# Patient Record
Sex: Female | Born: 1945 | Race: White | Hispanic: No | Marital: Single | Smoking: Current every day smoker
Health system: Southern US, Community
[De-identification: ages and names within clinical notes are randomized; demographics above are authoritative.]

## PROBLEM LIST (undated history)

## (undated) DIAGNOSIS — J449 Chronic obstructive pulmonary disease, unspecified: Secondary | ICD-10-CM

## (undated) DIAGNOSIS — J40 Bronchitis, not specified as acute or chronic: Secondary | ICD-10-CM

## (undated) DIAGNOSIS — F17210 Nicotine dependence, cigarettes, uncomplicated: Secondary | ICD-10-CM

## (undated) DIAGNOSIS — J189 Pneumonia, unspecified organism: Secondary | ICD-10-CM

## (undated) DIAGNOSIS — R0602 Shortness of breath: Secondary | ICD-10-CM

## (undated) HISTORY — PX: ABDOMINAL HYSTERECTOMY: SHX81

## (undated) HISTORY — PX: HIP FRACTURE SURGERY: SHX118

## (undated) HISTORY — PX: BREAST SURGERY: SHX581

---

## 2013-05-02 ENCOUNTER — Encounter (INDEPENDENT_AMBULATORY_CARE_PROVIDER_SITE_OTHER): Payer: Self-pay

## 2013-05-14 ENCOUNTER — Encounter (INDEPENDENT_AMBULATORY_CARE_PROVIDER_SITE_OTHER): Payer: Self-pay | Admitting: Surgery

## 2013-05-14 ENCOUNTER — Ambulatory Visit (INDEPENDENT_AMBULATORY_CARE_PROVIDER_SITE_OTHER): Payer: Medicare Other | Admitting: Surgery

## 2013-05-14 VITALS — BP 170/68 | HR 84 | Resp 18 | Ht 65.0 in | Wt 208.0 lb

## 2013-05-14 DIAGNOSIS — N6320 Unspecified lump in the left breast, unspecified quadrant: Secondary | ICD-10-CM | POA: Insufficient documentation

## 2013-05-14 DIAGNOSIS — N63 Unspecified lump in unspecified breast: Secondary | ICD-10-CM

## 2013-05-14 HISTORY — DX: Unspecified lump in the left breast, unspecified quadrant: N63.20

## 2013-05-14 NOTE — Progress Notes (Signed)
Patient ID: Deanna Petty, female   DOB: 03/21/1946, 67 y.o.   MRN: [id number]  Chief Complaint  Patient presents with  . New Evaluation    eval atypical lesion lt br    HPI Deanna Petty is a 67 y.o. female.  Referred by Dr. Tilda Burrow for left breast mass  HPI This is a 67 year old female who presents after a recent screening mammogram. This was her first mammogram in about 20 years. She was noted to have an area of asymmetry in her left breast. This is in the retroareolar region. She underwent ultrasound and spot compression views which showed a hypoechoic lesion in the left breast at 3:00 3 cm from the nipple. She underwent ultrasound-guided biopsy on 05/01/13 with placement of a clip. This showed a complex sclerosing lesion. There is no sign of atypia or malignancy. She now presents for surgical evaluation for excision. History reviewed. No pertinent past medical history.  Past Surgical History  Procedure Laterality Date  . Abdominal hysterectomy      Family History  Problem Relation Age of Onset  . Cancer Mother     oral, brain  . Diabetes Father     Social History History  Substance Use Topics  . Smoking status: Current Every Day Smoker -- 0.25 packs/day  . Smokeless tobacco: Not on file  . Alcohol Use: No    No Known Allergies  No current outpatient prescriptions on file.   No current facility-administered medications for this visit.    Review of Systems Review of Systems  Constitutional: Negative for fever, chills and unexpected weight change.  HENT: Negative for hearing loss, congestion, sore throat, trouble swallowing and voice change.   Eyes: Negative for visual disturbance.  Respiratory: Negative for cough and wheezing.   Cardiovascular: Negative for chest pain, palpitations and leg swelling.  Gastrointestinal: Negative for nausea, vomiting, abdominal pain, diarrhea, constipation, blood in stool, abdominal distention and anal bleeding.  Genitourinary:  Negative for hematuria, vaginal bleeding and difficulty urinating.  Musculoskeletal: Negative for arthralgias.  Skin: Negative for rash and wound.  Neurological: Negative for seizures, syncope and headaches.  Hematological: Negative for adenopathy. Does not bruise/bleed easily.  Psychiatric/Behavioral: Negative for confusion.    Blood pressure 170/68, pulse 84, resp. rate 18, height 5\' 5"  (1.651 m), weight 208 lb (94.348 kg).  Physical Exam Physical Exam WDWN in NAD HEENT:  EOMI, sclera anicteric Neck:  No masses, no thyromegaly Lungs:  CTA bilaterally; normal respiratory effort Breasts:  Bilateral fibrocystic changes; no nipple retraction or discharge; no axillary lymphadenopathy; no dominant masses on either side CV:  Regular rate and rhythm; no murmurs Abd:  +bowel sounds, soft, non-tender, no masses Ext:  Well-perfused; no edema Skin:  Warm, dry; no sign of jaundice  Data Reviewed Mammogram/ U/S/ core biopsy as described above  Assessment    Left breast mass - non-palpable - 3:00 3 cm from nipple     Plan    Left needle-localized lumpectomy.  The surgical procedure has been discussed with the patient.  Potential risks, benefits, alternative treatments, and expected outcomes have been explained.  All of the patient's questions at this time have been answered.  The likelihood of reaching the patient's treatment goal is good.  The patient understand the proposed surgical procedure and wishes to proceed.         Anna Beaird K. 05/14/2013, 12:00 PM

## 2013-05-16 ENCOUNTER — Encounter (INDEPENDENT_AMBULATORY_CARE_PROVIDER_SITE_OTHER): Payer: Self-pay

## 2013-05-29 ENCOUNTER — Encounter (HOSPITAL_COMMUNITY): Payer: Self-pay | Admitting: Pharmacy Technician

## 2013-05-31 NOTE — Pre-Procedure Instructions (Signed)
Deanna Petty  05/31/2013   Your procedure is scheduled on:  Tuesday June 11, 2013.  Report to [facility] immediately after leaving [facility].  Call this number if you have problems the morning of surgery: [phone]   Remember:   Do not eat food or drink liquids after midnight.   Take these medicines the morning of surgery with A SIP OF WATER: NONE  Take Albuterol inhaler if needed for wheezing or shortness of breath  Discontinue aspirin and herbal medications 5 days prior to surgery   Do not wear jewelry, make-up or nail polish.  Do not wear lotions, powders, or perfumes. You may NOT wear deodorant.  Do not shave 48 hours prior to surgery. .  Do not bring valuables to the hospital.  [facility] is not responsible for any belongings or valuables.  Contacts, dentures or bridgework may not be worn into surgery.  Leave suitcase in the car. After surgery it may be brought to your room.  For patients admitted to the hospital, checkout time is 11:00 AM the day of discharge.   Patients discharged the day of surgery will not be allowed to drive home.  Name and phone number of your driver: Family/Friend  Special Instructions: Shower using CHG 2 nights before surgery and the night before surgery.  If you shower the day of surgery use CHG.  Use special wash - you have one bottle of CHG for all showers.  You should use approximately 1/3 of the bottle for each shower.   Please read over the following fact sheets that you were given: Pain Booklet, Coughing and Deep Breathing and Surgical Site Infection Prevention

## 2013-06-03 ENCOUNTER — Encounter (HOSPITAL_COMMUNITY)
Admission: RE | Admit: 2013-06-03 | Discharge: 2013-06-03 | Disposition: A | Discharge status: Home / Self Care | Payer: Medicare Other | Source: Ambulatory Visit | Attending: Surgery | Admitting: Surgery

## 2013-06-03 ENCOUNTER — Encounter (HOSPITAL_COMMUNITY): Payer: Self-pay

## 2013-06-03 ENCOUNTER — Ambulatory Visit (HOSPITAL_COMMUNITY)
Admission: RE | Admit: 2013-06-03 | Discharge: 2013-06-03 | Disposition: A | Discharge status: Home / Self Care | Payer: Medicare Other | Source: Ambulatory Visit | Attending: Surgery | Admitting: Surgery

## 2013-06-03 DIAGNOSIS — R0602 Shortness of breath: Secondary | ICD-10-CM | POA: Insufficient documentation

## 2013-06-03 DIAGNOSIS — R030 Elevated blood-pressure reading, without diagnosis of hypertension: Secondary | ICD-10-CM | POA: Insufficient documentation

## 2013-06-03 DIAGNOSIS — N63 Unspecified lump in unspecified breast: Secondary | ICD-10-CM | POA: Insufficient documentation

## 2013-06-03 DIAGNOSIS — Z01812 Encounter for preprocedural laboratory examination: Secondary | ICD-10-CM | POA: Insufficient documentation

## 2013-06-03 DIAGNOSIS — Z01818 Encounter for other preprocedural examination: Secondary | ICD-10-CM | POA: Insufficient documentation

## 2013-06-03 DIAGNOSIS — R9431 Abnormal electrocardiogram [ECG] [EKG]: Secondary | ICD-10-CM | POA: Insufficient documentation

## 2013-06-03 DIAGNOSIS — Z01810 Encounter for preprocedural cardiovascular examination: Secondary | ICD-10-CM | POA: Insufficient documentation

## 2013-06-03 DIAGNOSIS — Z8701 Personal history of pneumonia (recurrent): Secondary | ICD-10-CM | POA: Insufficient documentation

## 2013-06-03 HISTORY — DX: Bronchitis, not specified as acute or chronic: J40

## 2013-06-03 HISTORY — DX: Pneumonia, unspecified organism: J18.9

## 2013-06-03 HISTORY — DX: Shortness of breath: R06.02

## 2013-06-03 HISTORY — DX: Nicotine dependence, cigarettes, uncomplicated: F17.210

## 2013-06-03 LAB — BASIC METABOLIC PANEL
BUN: 12 mg/dL (ref 6–23)
CO2: 26 mEq/L (ref 19–32)
Calcium: 9.7 mg/dL (ref 8.4–10.5)
Creatinine, Ser: 0.82 mg/dL (ref 0.50–1.10)
GFR calc non Af Amer: 73 mL/min — ABNORMAL LOW (ref >90)
Glucose, Bld: 165 mg/dL — ABNORMAL HIGH (ref 70–99)
Sodium: 138 mEq/L (ref 135–145)

## 2013-06-03 LAB — CBC
MCH: 32.4 pg (ref 26.0–34.0)
MCHC: 34.9 g/dL (ref 30.0–36.0)
MCV: 92.7 fL (ref 78.0–100.0)
Platelets: 167 10*3/uL (ref 150–400)
RDW: 13.1 % (ref 11.5–15.5)

## 2013-06-03 NOTE — Progress Notes (Signed)
Patient denied having a stress test, cardiac cath, or sleep study. PCP is Hyacinth Meeker, FNP at [facility].

## 2013-06-04 ENCOUNTER — Other Ambulatory Visit (INDEPENDENT_AMBULATORY_CARE_PROVIDER_SITE_OTHER): Payer: Self-pay | Admitting: Surgery

## 2013-06-04 ENCOUNTER — Telehealth (INDEPENDENT_AMBULATORY_CARE_PROVIDER_SITE_OTHER): Payer: Self-pay

## 2013-06-04 ENCOUNTER — Encounter (INDEPENDENT_AMBULATORY_CARE_PROVIDER_SITE_OTHER): Payer: Self-pay | Admitting: General Surgery

## 2013-06-04 ENCOUNTER — Telehealth (INDEPENDENT_AMBULATORY_CARE_PROVIDER_SITE_OTHER): Payer: Self-pay | Admitting: General Surgery

## 2013-06-04 DIAGNOSIS — R9431 Abnormal electrocardiogram [ECG] [EKG]: Secondary | ICD-10-CM

## 2013-06-04 NOTE — Telephone Encounter (Signed)
Please get her in with a cardiologist ASAP

## 2013-06-04 NOTE — Telephone Encounter (Signed)
Patient is seeing Dr Excell Seltzer on 06-07-13 @ 9:15. I told the patient to be there at 9:00. I told her that I will need to get the okay from Dr Excell Seltzer for her to have her surgery on Tuesday the 06-11-13, has of now it is still on for Tuesday, but I have to get the okay from Dr Excell Seltzer, I told her that we will be talking after I here from Dr Excell Seltzer

## 2013-06-04 NOTE — Telephone Encounter (Signed)
The pt is scheduled for a lumpectomy on 8/26 by Dr Corliss Skains but short stay is requesting cardiac clearance. The pt was having T wave inversion on the EKG and they called the PCP to see if any EKG to compare but they don't have anything to compare to. The pt has never been seen by a cardiologist. Please advise.

## 2013-06-04 NOTE — Progress Notes (Addendum)
Anesthesia Chart Review:  Patient is a 67 year old female scheduled for left needle localized lumpectomy on 06/11/13 by Dr. Corliss Skains.  History includes left breast mass with biopsy showing a complex sclerosing lesion but no evidence of malignancy, hysterectomy, smoking, PNA, bronchitis, obesity. Based on her recent BP readings (170/68 on 05/14/13 and 191/84 on 06/03/13), she may have untreated HTN. PCP is listed as Hyacinth Meeker, FNP at [facility] [phone] only once.  EKG on 8/189/14 showed NSR, rightward axis, diffuse ST/T wave abnormality--consider inferior and anterolateral ischemia.  There are no comparison EKGs in Epic or at her PCP office.  Preoperative CXR and labs noted.  With significantly elevated BP and abnormal EKG, recommend cardiology evaluation preoperatively.  Anesthesiologist Dr. Michelle Piper agrees with plan.  Alisha at [facility] notified of elevated BP reading and need for cardiology evaluation for the above reasons.  Velna Ochs [facility]/Anesthesiology Phone [phone] 06/04/2013 1:57 PM  Addendum: 06/10/13 5:00 PM Patient was evaluated by Dr. Excell Seltzer on 06/07/13.  BP then was 144/70.  He recommended smoking cessation and a dobutamime stress echo prior to surgery.  This was done today--report is still pending.  I called [facility] ~ 1400 to notify the reading physician that results would be needed before patient's surgery scheduled for tomorrow.  I also sent a staff message to [facility] Glade, MA ~ 1500 notifying her that the clearance note faxed was pending a low risk stress test result which was not yet available.  I sent Dr. Corliss Skains a staff message as well to alert him that I would have our late nurses follow-up otherwise results will have to be checked first thing in the morning.   As long as her stress test was low risk, Dr. Excell Seltzer feels she could proceed with surgery.

## 2013-06-07 ENCOUNTER — Encounter: Payer: Self-pay | Admitting: Cardiovascular Disease

## 2013-06-07 ENCOUNTER — Ambulatory Visit (INDEPENDENT_AMBULATORY_CARE_PROVIDER_SITE_OTHER): Payer: Medicare Other | Admitting: Cardiovascular Disease

## 2013-06-07 VITALS — BP 144/70 | HR 77 | Ht 65.0 in | Wt 205.0 lb

## 2013-06-07 DIAGNOSIS — R0602 Shortness of breath: Secondary | ICD-10-CM

## 2013-06-07 DIAGNOSIS — R9431 Abnormal electrocardiogram [ECG] [EKG]: Secondary | ICD-10-CM

## 2013-06-07 NOTE — Progress Notes (Signed)
HPI:   67 year-old woman presenting for preoperative cardiac evaluation before upcoming lumpectomy. She had her first screening mammogram in over 20 years and this demonstrated an area of asymmetry in the left breast. She's undergone a biopsy and now is scheduled for lumpectomy.Her preoperative EKG was abnormal and she is referred for preoperative cardiac evaluation.  She has no past history of cardiac disease. She's had very little medical care. The patient has been a long-standing smoker. She has shortness of breath with 1 flight of stairs or with one block of walkingon level ground.she denies chest pain or pressure with activity. She denies orthopnea, PND, or palpitations. She's had no lightheadedness or syncope.she has occasional cough and wheezing. She uses inhalers as needed.  Outpatient Encounter Prescriptions as of 06/07/2013  Medication Sig Dispense Refill  . albuterol (PROVENTIL HFA;VENTOLIN HFA) 108 (90 BASE) MCG/ACT inhaler Inhale 2 puffs into the lungs every 6 (six) hours as needed for wheezing or shortness of breath.      Marland Kitchen ibuprofen (ADVIL,MOTRIN) 200 MG tablet Take 400 mg by mouth every 6 (six) hours as needed for pain.       No facility-administered encounter medications on file as of 06/07/2013.    Review of patient's allergies indicates no known allergies.  Past Medical History  Diagnosis Date  . Cigarette smoker   . Bronchitis   . Pneumonia   . Shortness of breath     with exertion; lasted used inhaler 06/02/13    Past Surgical History  Procedure Laterality Date  . Abdominal hysterectomy      History   Social History  . Marital Status: Single    Spouse Name: N/A    Number of Children: N/A  . Years of Education: N/A   Occupational History  . Not on file.   Social History Main Topics  . Smoking status: Current Every Day Smoker -- 0.25 packs/day for 40 years    Types: Cigarettes  . Smokeless tobacco: Not on file  . Alcohol Use: Yes     Comment: rare  .  Drug Use: No  . Sexual Activity: Not on file   Other Topics Concern  . Not on file   Social History Narrative  . No narrative on file    Family History  Problem Relation Age of Onset  . Cancer Mother     oral, brain  . Diabetes Father    Family history: Father had a myocardial infarction at age 62  ROS:  General: no fevers/chills/night sweats Eyes: no blurry vision, diplopia, or amaurosis ENT: no sore throat or hearing loss Resp: positive for cough, wheezing, negative for hemoptysis CV: no edema or palpitations GI: no abdominal pain, nausea, vomiting, diarrhea, or constipation GU: no dysuria, frequency, or hematuria Skin: no rash Neuro: no headache, numbness, tingling, or weakness of extremities Musculoskeletal: no joint pain or swelling Heme: no bleeding, DVT, or easy bruising Endo: no polydipsia or polyuria  BP 144/70  Pulse 77  Ht 5\' 5"  (1.651 m)  Wt 205 lb (92.987 kg)  BMI 34.11 kg/m2  SpO2 97%  PHYSICAL EXAM: Pt is alert and oriented, WD, WN, in no distress. HEENT: normal Neck: JVP normal. Carotid upstrokes normal without bruits. No thyromegaly. Lungs: equal expansion, coarse breath sounds bilaterally, prolonged expiratory phase CV: Apex is discrete and nondisplaced, RRR without murmur or gallop Abd: soft, NT, +BS, no bruit, no hepatosplenomegaly Back: no CVA tenderness Ext: no C/C/E        Femoral pulses 2+=  without bruits        DP/PT pulses intact and = Skin: warm and dry without rash Neuro: CNII-XII intact             Strength intact = bilaterally  EKG:  Sinus rhythm, right axis deviation, T-wave abnormality consider anterolateral ischemia.  ASSESSMENT AND PLAN: 67 year old woman with cardiac risk factors of heavy tobacco use and family history of CAD.Her EKG has significant abnormality and raises concern for anterior ischemia. Shortness of breath is probably multifactorial, especially considering her smoking history. I think her EKG abnormality is  significant enough to warrant preoperative stress testing.she will be scheduled for a dobutamine stress echocardiogram on Monday. Her surgery is scheduled for Tuesday. As long as her stress test is low risk, she can proceed with surgery without further testing.  She was counseled about tobacco cessation today. We also recommended that she establish with a primary care physician. By history she's had lipids checked and they were okay, but we do not have details of this. I will see her back as needed unless her stress test shows significant abnormality.  Tonny Bollman 06/07/2013 6:29 PM

## 2013-06-07 NOTE — Patient Instructions (Addendum)
Your physician has requested that you have a dobutamine echocardiogram. For further information please visit [email or web address] Please follow instruction sheet as given. PLEASE ARRIVE AT 7:30 ON 06/10/13 FOR AN 8:30 APPOINTMENT, BRING ALBUTEROL INHALER  Your physician recommends that you continue on your current medications as directed. Please refer to the Current Medication list given to you today.

## 2013-06-10 ENCOUNTER — Ambulatory Visit (HOSPITAL_BASED_OUTPATIENT_CLINIC_OR_DEPARTMENT_OTHER): Payer: Medicare Other

## 2013-06-10 ENCOUNTER — Ambulatory Visit (HOSPITAL_COMMUNITY): Payer: Medicare Other | Attending: Cardiovascular Disease | Admitting: Radiology

## 2013-06-10 ENCOUNTER — Telehealth (INDEPENDENT_AMBULATORY_CARE_PROVIDER_SITE_OTHER): Payer: Self-pay | Admitting: General Surgery

## 2013-06-10 DIAGNOSIS — Z87891 Personal history of nicotine dependence: Secondary | ICD-10-CM | POA: Insufficient documentation

## 2013-06-10 DIAGNOSIS — R9431 Abnormal electrocardiogram [ECG] [EKG]: Secondary | ICD-10-CM

## 2013-06-10 DIAGNOSIS — Z8249 Family history of ischemic heart disease and other diseases of the circulatory system: Secondary | ICD-10-CM | POA: Insufficient documentation

## 2013-06-10 DIAGNOSIS — R0989 Other specified symptoms and signs involving the circulatory and respiratory systems: Secondary | ICD-10-CM

## 2013-06-10 DIAGNOSIS — R0602 Shortness of breath: Secondary | ICD-10-CM

## 2013-06-10 DIAGNOSIS — R0609 Other forms of dyspnea: Secondary | ICD-10-CM | POA: Insufficient documentation

## 2013-06-10 NOTE — Telephone Encounter (Signed)
Called patient to let her know that we have received cardiac clearance from Dr Excell Seltzer and she will have her surgery on Wednesday 06-12-2013 with Dr Corliss Skains.

## 2013-06-10 NOTE — Progress Notes (Signed)
Dobutamine Stress Echocardiogram Performed. 

## 2013-06-11 MED ORDER — CEFAZOLIN SODIUM-DEXTROSE 2-3 GM-% IV SOLR
2.0000 g | INTRAVENOUS | Status: AC
  Administered 2013-06-12: 2 g via INTRAVENOUS
  Filled 2013-06-11: qty 50

## 2013-06-11 NOTE — Progress Notes (Signed)
I spoke to patient and she is to go to [place] at 11:30 , 06/12/13 and come to [facility] after that visit.  Pt asked about drinking since surgery is later in the day.  I instructed her  That no eating or drinking after midnight still remains.

## 2013-06-12 ENCOUNTER — Ambulatory Visit (HOSPITAL_COMMUNITY)
Admission: RE | Admit: 2013-06-12 | Discharge: 2013-06-12 | Disposition: A | Discharge status: Home / Self Care | Payer: Medicare Other | Source: Ambulatory Visit | Attending: Surgery | Admitting: Surgery

## 2013-06-12 ENCOUNTER — Encounter (HOSPITAL_COMMUNITY)
Admission: RE | Disposition: A | Discharge status: Home / Self Care | Payer: Self-pay | Source: Ambulatory Visit | Attending: Surgery

## 2013-06-12 ENCOUNTER — Encounter (HOSPITAL_COMMUNITY): Payer: Self-pay | Admitting: Certified Registered Nurse Anesthetist

## 2013-06-12 ENCOUNTER — Encounter (HOSPITAL_COMMUNITY): Payer: Self-pay | Admitting: Vascular Surgery

## 2013-06-12 ENCOUNTER — Ambulatory Visit (HOSPITAL_COMMUNITY): Payer: Medicare Other | Admitting: Certified Registered Nurse Anesthetist

## 2013-06-12 ENCOUNTER — Telehealth (INDEPENDENT_AMBULATORY_CARE_PROVIDER_SITE_OTHER): Payer: Self-pay | Admitting: General Surgery

## 2013-06-12 DIAGNOSIS — D249 Benign neoplasm of unspecified breast: Secondary | ICD-10-CM | POA: Insufficient documentation

## 2013-06-12 DIAGNOSIS — I10 Essential (primary) hypertension: Secondary | ICD-10-CM | POA: Insufficient documentation

## 2013-06-12 DIAGNOSIS — N6320 Unspecified lump in the left breast, unspecified quadrant: Secondary | ICD-10-CM

## 2013-06-12 DIAGNOSIS — R920 Mammographic microcalcification found on diagnostic imaging of breast: Secondary | ICD-10-CM

## 2013-06-12 DIAGNOSIS — J4489 Other specified chronic obstructive pulmonary disease: Secondary | ICD-10-CM | POA: Insufficient documentation

## 2013-06-12 DIAGNOSIS — N6019 Diffuse cystic mastopathy of unspecified breast: Secondary | ICD-10-CM

## 2013-06-12 DIAGNOSIS — J449 Chronic obstructive pulmonary disease, unspecified: Secondary | ICD-10-CM | POA: Insufficient documentation

## 2013-06-12 HISTORY — PX: BREAST LUMPECTOMY WITH NEEDLE LOCALIZATION: SHX5759

## 2013-06-12 SURGERY — BREAST LUMPECTOMY WITH NEEDLE LOCALIZATION
Anesthesia: General | Site: Breast | Laterality: Left | Wound class: Clean

## 2013-06-12 MED ORDER — HYDROMORPHONE HCL PF 1 MG/ML IJ SOLN: 0.2500 mg | INTRAMUSCULAR | Status: DC | PRN

## 2013-06-12 MED ORDER — FENTANYL CITRATE 0.05 MG/ML IJ SOLN
INTRAMUSCULAR | Status: DC | PRN
Start: 2013-06-12 — End: 2013-06-12
  Administered 2013-06-12: 25 ug via INTRAVENOUS
  Administered 2013-06-12: 100 ug via INTRAVENOUS

## 2013-06-12 MED ORDER — BUPIVACAINE-EPINEPHRINE 0.25% -1:200000 IJ SOLN
INTRAMUSCULAR | Status: DC | PRN
  Administered 2013-06-12: 20 mL

## 2013-06-12 MED ORDER — BUPIVACAINE-EPINEPHRINE PF 0.25-1:200000 % IJ SOLN
INTRAMUSCULAR | Status: AC
  Filled 2013-06-12: qty 30

## 2013-06-12 MED ORDER — OXYCODONE HCL 5 MG PO TABS
ORAL_TABLET | ORAL | Status: AC
  Filled 2013-06-12: qty 1

## 2013-06-12 MED ORDER — MIDAZOLAM HCL 5 MG/5ML IJ SOLN
INTRAMUSCULAR | Status: DC | PRN
  Administered 2013-06-12: 2 mg via INTRAVENOUS

## 2013-06-12 MED ORDER — MEPERIDINE HCL 25 MG/ML IJ SOLN: 6.2500 mg | INTRAMUSCULAR | Status: DC | PRN

## 2013-06-12 MED ORDER — LACTATED RINGERS IV SOLN
INTRAVENOUS | Status: DC
  Administered 2013-06-12: 13:00:00 via INTRAVENOUS

## 2013-06-12 MED ORDER — 0.9 % SODIUM CHLORIDE (POUR BTL) OPTIME
TOPICAL | Status: DC | PRN
  Administered 2013-06-12: 1000 mL

## 2013-06-12 MED ORDER — PROPOFOL 10 MG/ML IV BOLUS
INTRAVENOUS | Status: DC | PRN
  Administered 2013-06-12: 200 mg via INTRAVENOUS

## 2013-06-12 MED ORDER — ONDANSETRON HCL 4 MG/2ML IJ SOLN: 4.0000 mg | INTRAMUSCULAR | Status: DC | PRN

## 2013-06-12 MED ORDER — ONDANSETRON HCL 4 MG/2ML IJ SOLN
INTRAMUSCULAR | Status: DC | PRN
  Administered 2013-06-12: 4 mg via INTRAVENOUS

## 2013-06-12 MED ORDER — ONDANSETRON HCL 4 MG/2ML IJ SOLN: 4.0000 mg | Freq: Once | INTRAMUSCULAR | Status: DC | PRN

## 2013-06-12 MED ORDER — OXYCODONE HCL 5 MG/5ML PO SOLN: 5.0000 mg | Freq: Once | ORAL | Status: DC | PRN

## 2013-06-12 MED ORDER — HYDROCODONE-ACETAMINOPHEN 5-325 MG PO TABS: 1.0000 | ORAL_TABLET | ORAL | Status: DC | PRN

## 2013-06-12 MED ORDER — OXYCODONE HCL 5 MG PO TABS: 5.0000 mg | ORAL_TABLET | Freq: Once | ORAL | Status: DC | PRN

## 2013-06-12 MED ORDER — EPHEDRINE SULFATE 50 MG/ML IJ SOLN
INTRAMUSCULAR | Status: DC | PRN
  Administered 2013-06-12: 10 mg via INTRAVENOUS

## 2013-06-12 MED ORDER — LACTATED RINGERS IV SOLN
INTRAVENOUS | Status: DC | PRN
  Administered 2013-06-12 (×2): via INTRAVENOUS

## 2013-06-12 MED ORDER — MORPHINE SULFATE 2 MG/ML IJ SOLN: 2.0000 mg | INTRAMUSCULAR | Status: DC | PRN

## 2013-06-12 MED ORDER — LIDOCAINE HCL (CARDIAC) 20 MG/ML IV SOLN
INTRAVENOUS | Status: DC | PRN
  Administered 2013-06-12: 100 mg via INTRAVENOUS

## 2013-06-12 SURGICAL SUPPLY — 46 items
APL SKNCLS STERI-STRIP NONHPOA (GAUZE/BANDAGES/DRESSINGS)
APPLIER CLIP 9.375 MED OPEN (MISCELLANEOUS)
APR CLP MED 9.3 20 MLT OPN (MISCELLANEOUS)
BENZOIN TINCTURE PRP APPL 2/3 (GAUZE/BANDAGES/DRESSINGS) ×1 IMPLANT
BINDER BREAST LRG (GAUZE/BANDAGES/DRESSINGS) IMPLANT
BINDER BREAST XLRG (GAUZE/BANDAGES/DRESSINGS) IMPLANT
BLADE SURG ROTATE 9660 (MISCELLANEOUS) IMPLANT
CHLORAPREP W/TINT 26ML (MISCELLANEOUS) ×2 IMPLANT
CLIP APPLIE 9.375 MED OPEN (MISCELLANEOUS) IMPLANT
CLIP TI WIDE RED SMALL 24 (CLIP) IMPLANT
CLOTH BEACON ORANGE TIMEOUT ST (SAFETY) ×2 IMPLANT
CONT SPEC 4OZ CLIKSEAL STRL BL (MISCELLANEOUS) ×1 IMPLANT
COVER SURGICAL LIGHT HANDLE (MISCELLANEOUS) ×2 IMPLANT
DECANTER SPIKE VIAL GLASS SM (MISCELLANEOUS) IMPLANT
DEVICE DUBIN SPECIMEN MAMMOGRA (MISCELLANEOUS) ×1 IMPLANT
DRAPE LAPAROTOMY TRNSV 102X78 (DRAPE) ×2 IMPLANT
DRAPE UTILITY 15X26 W/TAPE STR (DRAPE) ×4 IMPLANT
DRSG TEGADERM 2-3/8X2-3/4 SM (GAUZE/BANDAGES/DRESSINGS) ×2 IMPLANT
ELECT REM PT RETURN 9FT ADLT (ELECTROSURGICAL) ×2
ELECTRODE REM PT RTRN 9FT ADLT (ELECTROSURGICAL) ×1 IMPLANT
GAUZE SPONGE 4X4 16PLY XRAY LF (GAUZE/BANDAGES/DRESSINGS) ×2 IMPLANT
GLOVE BIO SURGEON STRL SZ7 (GLOVE) ×2 IMPLANT
GLOVE BIOGEL PI IND STRL 7.5 (GLOVE) ×1 IMPLANT
GLOVE BIOGEL PI INDICATOR 7.5 (GLOVE) ×1
GLOVE ORTHOPEDIC STR SZ6.5 (GLOVE) ×1 IMPLANT
GLOVE SURG ORTHO 7.0 STRL STRW (GLOVE) ×1 IMPLANT
GOWN STRL NON-REIN LRG LVL3 (GOWN DISPOSABLE) ×4 IMPLANT
KIT BASIN OR (CUSTOM PROCEDURE TRAY) ×2 IMPLANT
KIT MARKER MARGIN INK (KITS) ×1 IMPLANT
KIT ROOM TURNOVER OR (KITS) ×2 IMPLANT
NDL HYPO 25GX1X1/2 BEV (NEEDLE) ×1 IMPLANT
NEEDLE HYPO 25GX1X1/2 BEV (NEEDLE) ×2 IMPLANT
NS IRRIG 1000ML POUR BTL (IV SOLUTION) ×2 IMPLANT
PACK GENERAL/GYN (CUSTOM PROCEDURE TRAY) ×2 IMPLANT
PAD ARMBOARD 7.5X6 YLW CONV (MISCELLANEOUS) ×4 IMPLANT
SPECIMEN JAR MEDIUM (MISCELLANEOUS) ×1 IMPLANT
SPONGE GAUZE 4X4 12PLY (GAUZE/BANDAGES/DRESSINGS) ×2 IMPLANT
STAPLER VISISTAT 35W (STAPLE) ×2 IMPLANT
STRIP CLOSURE SKIN 1/2X4 (GAUZE/BANDAGES/DRESSINGS) ×1 IMPLANT
SUT MNCRL AB 4-0 PS2 18 (SUTURE) ×2 IMPLANT
SUT VIC AB 3-0 SH 27 (SUTURE) ×2
SUT VIC AB 3-0 SH 27X BRD (SUTURE) ×1 IMPLANT
SYR CONTROL 10ML LL (SYRINGE) ×2 IMPLANT
TOWEL OR 17X24 6PK STRL BLUE (TOWEL DISPOSABLE) ×2 IMPLANT
TOWEL OR 17X26 10 PK STRL BLUE (TOWEL DISPOSABLE) ×2 IMPLANT
WATER STERILE IRR 1000ML POUR (IV SOLUTION) IMPLANT

## 2013-06-12 NOTE — Op Note (Signed)
Preop diagnosis: Left breast mass Postop diagnosis: same Procedure performed: Left needle localized lumpectomy Surgeon:Tylen Leverich K. Anesthesia: Gen. Via LMA Indications: This is a 67 year old female who presents after an abnormal mammogram. Biopsy showed a complex sclerosing lesion with no sign of atypia or malignancy. She presents now for excisional biopsy.  Description of procedure. Patient brought to the operating room and placed in a supine position on the operating room table. After adequate level of general anesthesia was obtained the patient's left breast was prepped with ChloraPrep and draped in sterile fashion. A timeout was taken to ensure the proper patient proper procedure. We infiltrated the area around the wire in the left lower outer quadrant with 0.25% Marcaine. A transverse incision was made. We raised skin flaps. We then fall the wire down for a distance of about 5 cm. We took a cylinder of tissue around the wire. We used cautery for hemostasis. The specimen was oriented with a paint kit. A specimen mammogram showed that the tip of the wire was adjacent to the marking clip along with some microcalcifications within the specimen. We irrigated the wound thoroughly. The wound was closed with a deep layer 3-0 Vicryl and a subcuticular layer of 4-0 Monocryl. Steri-Strips and clean dressing were applied. Patient was then extubated and brought to the recovery room in stable condition. All sponge, initially, and needle counts are correct.  Wilmon Arms. Corliss Skains, MD, [facility]  General/ Trauma Surgery  06/12/2013 3:06 PM

## 2013-06-12 NOTE — Interval H&P Note (Signed)
History and Physical Interval Note:  06/12/2013 1:33 PM  Deanna Petty  has presented today for surgery, with the diagnosis of left breast mass  The various methods of treatment have been discussed with the patient and family. After consideration of risks, benefits and other options for treatment, the patient has consented to  Procedure(s): LEFT BREAST LUMPECTOMY WITH NEEDLE LOCALIZATION (Left) as a surgical intervention .  The patient's history has been reviewed, patient examined, no change in status, stable for surgery.  I have reviewed the patient's chart and labs.  Questions were answered to the patient's satisfaction.     Yaiden Yang K.

## 2013-06-12 NOTE — Anesthesia Preprocedure Evaluation (Addendum)
Anesthesia Evaluation  Patient identified by MRN, date of birth, ID band Patient awake    Reviewed: Allergy & Precautions, H&P , NPO status , Patient's Chart, lab work & pertinent test results  Airway Mallampati: II TM Distance: >3 FB Neck ROM: Full    Dental  (+) Edentulous Upper and Edentulous Lower   Pulmonary shortness of breath and with exertion, COPD COPD inhaler, Current Smoker,          Cardiovascular hypertension,     Neuro/Psych    GI/Hepatic   Endo/Other    Renal/GU      Musculoskeletal   Abdominal   Peds  Hematology   Anesthesia Other Findings   Reproductive/Obstetrics                          Anesthesia Physical Anesthesia Plan  ASA: II  Anesthesia Plan: General   Post-op Pain Management:    Induction: Intravenous  Airway Management Planned: LMA  Additional Equipment:   Intra-op Plan:   Post-operative Plan: Extubation in OR  Informed Consent: I have reviewed the patients History and Physical, chart, labs and discussed the procedure including the risks, benefits and alternatives for the proposed anesthesia with the patient or authorized representative who has indicated his/her understanding and acceptance.     Plan Discussed with: CRNA, Surgeon and Anesthesiologist  Anesthesia Plan Comments:        Anesthesia Quick Evaluation

## 2013-06-12 NOTE — Transfer of Care (Signed)
Immediate Anesthesia Transfer of Care Note  Patient: Deanna Petty  Procedure(s) Performed: Procedure(s): LEFT BREAST LUMPECTOMY WITH NEEDLE LOCALIZATION (Left)  Patient Location: PACU  Anesthesia Type:General  Level of Consciousness: awake, alert , oriented and patient cooperative  Airway & Oxygen Therapy: Patient Spontanous Breathing and Patient connected to nasal cannula oxygen  Post-op Assessment: Report given to PACU RN, Post -op Vital signs reviewed and stable and Patient moving all extremities X 4  Post vital signs: Reviewed and stable  Complications: No apparent anesthesia complications

## 2013-06-12 NOTE — H&P (View-Only) (Signed)
Patient ID: Deanna Petty, female   DOB: 06/06/1946, 66 y.o.   MRN: [id number]  Chief Complaint  Patient presents with  . New Evaluation    eval atypical lesion lt br    HPI Deanna Petty is a 66 y.o. female.  Referred by Dr. Cornella for left breast mass  HPI This is a 66-year-old female who presents after a recent screening mammogram. This was her first mammogram in about 20 years. She was noted to have an area of asymmetry in her left breast. This is in the retroareolar region. She underwent ultrasound and spot compression views which showed a hypoechoic lesion in the left breast at 3:00 3 cm from the nipple. She underwent ultrasound-guided biopsy on 05/01/13 with placement of a clip. This showed a complex sclerosing lesion. There is no sign of atypia or malignancy. She now presents for surgical evaluation for excision. History reviewed. No pertinent past medical history.  Past Surgical History  Procedure Laterality Date  . Abdominal hysterectomy      Family History  Problem Relation Age of Onset  . Cancer Mother     oral, brain  . Diabetes Father     Social History History  Substance Use Topics  . Smoking status: Current Every Day Smoker -- 0.25 packs/day  . Smokeless tobacco: Not on file  . Alcohol Use: No    No Known Allergies  No current outpatient prescriptions on file.   No current facility-administered medications for this visit.    Review of Systems Review of Systems  Constitutional: Negative for fever, chills and unexpected weight change.  HENT: Negative for hearing loss, congestion, sore throat, trouble swallowing and voice change.   Eyes: Negative for visual disturbance.  Respiratory: Negative for cough and wheezing.   Cardiovascular: Negative for chest pain, palpitations and leg swelling.  Gastrointestinal: Negative for nausea, vomiting, abdominal pain, diarrhea, constipation, blood in stool, abdominal distention and anal bleeding.  Genitourinary:  Negative for hematuria, vaginal bleeding and difficulty urinating.  Musculoskeletal: Negative for arthralgias.  Skin: Negative for rash and wound.  Neurological: Negative for seizures, syncope and headaches.  Hematological: Negative for adenopathy. Does not bruise/bleed easily.  Psychiatric/Behavioral: Negative for confusion.    Blood pressure 170/68, pulse 84, resp. rate 18, height 5' 5" (1.651 m), weight 208 lb (94.348 kg).  Physical Exam Physical Exam WDWN in NAD HEENT:  EOMI, sclera anicteric Neck:  No masses, no thyromegaly Lungs:  CTA bilaterally; normal respiratory effort Breasts:  Bilateral fibrocystic changes; no nipple retraction or discharge; no axillary lymphadenopathy; no dominant masses on either side CV:  Regular rate and rhythm; no murmurs Abd:  +bowel sounds, soft, non-tender, no masses Ext:  Well-perfused; no edema Skin:  Warm, dry; no sign of jaundice  Data Reviewed Mammogram/ U/S/ core biopsy as described above  Assessment    Left breast mass - non-palpable - 3:00 3 cm from nipple     Plan    Left needle-localized lumpectomy.  The surgical procedure has been discussed with the patient.  Potential risks, benefits, alternative treatments, and expected outcomes have been explained.  All of the patient's questions at this time have been answered.  The likelihood of reaching the patient's treatment goal is good.  The patient understand the proposed surgical procedure and wishes to proceed.         Darcel Frane K. 05/14/2013, 12:00 PM    

## 2013-06-12 NOTE — Preoperative (Signed)
Beta Blockers   Reason not to administer Beta Blockers:Not Applicable 

## 2013-06-12 NOTE — Anesthesia Postprocedure Evaluation (Signed)
Anesthesia Post Note  Patient: Deanna Petty  Procedure(s) Performed: Procedure(s) (LRB): LEFT BREAST LUMPECTOMY WITH NEEDLE LOCALIZATION (Left)  Anesthesia type: General  Patient location: PACU  Post pain: Pain level controlled and Adequate analgesia  Post assessment: Post-op Vital signs reviewed, Patient's Cardiovascular Status Stable, Respiratory Function Stable, Patent Airway and Pain level controlled  Last Vitals:  Filed Vitals:   06/12/13 1615  BP: 152/60  Pulse: 79  Temp:   Resp: 28    Post vital signs: Reviewed and stable  Level of consciousness: awake, alert  and oriented  Complications: No apparent anesthesia complications

## 2013-06-12 NOTE — Telephone Encounter (Signed)
Error

## 2013-06-12 NOTE — Anesthesia Procedure Notes (Signed)
Procedure Name: LMA Insertion Date/Time: 06/12/2013 2:12 PM Performed by: Rogelia Boga Pre-anesthesia Checklist: Patient identified, Emergency Drugs available, Suction available, Patient being monitored and Timeout performed Patient Re-evaluated:Patient Re-evaluated prior to inductionOxygen Delivery Method: Circle system utilized Preoxygenation: Pre-oxygenation with 100% oxygen Intubation Type: IV induction LMA: LMA inserted LMA Size: 4.0 Tube type: Oral Number of attempts: 1 Placement Confirmation: positive ETCO2 and breath sounds checked- equal and bilateral Tube secured with: Tape Dental Injury: Teeth and Oropharynx as per pre-operative assessment

## 2013-06-14 ENCOUNTER — Telehealth (INDEPENDENT_AMBULATORY_CARE_PROVIDER_SITE_OTHER): Payer: Self-pay | Admitting: General Surgery

## 2013-06-14 ENCOUNTER — Encounter (HOSPITAL_COMMUNITY): Payer: Self-pay | Admitting: Surgery

## 2013-06-14 NOTE — Telephone Encounter (Signed)
Called patient back to see if she have gotten your VM and she stated that she did and I told her to keep her apt with [place] on 06-28-13

## 2013-06-28 ENCOUNTER — Ambulatory Visit (INDEPENDENT_AMBULATORY_CARE_PROVIDER_SITE_OTHER): Payer: Medicare Other | Admitting: Surgery

## 2013-06-28 ENCOUNTER — Encounter (INDEPENDENT_AMBULATORY_CARE_PROVIDER_SITE_OTHER): Payer: Self-pay | Admitting: Surgery

## 2013-06-28 VITALS — BP 139/88 | HR 64 | Temp 98.6°F | Resp 14 | Ht 65.0 in | Wt 202.4 lb

## 2013-06-28 DIAGNOSIS — N63 Unspecified lump in unspecified breast: Secondary | ICD-10-CM

## 2013-06-28 DIAGNOSIS — N6320 Unspecified lump in the left breast, unspecified quadrant: Secondary | ICD-10-CM

## 2013-06-28 NOTE — Progress Notes (Signed)
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 The patient is doing well. No tenderness. Her incision is healing with no sign of infection. She should continue with annual mammograms and monthly self breast examinations. Followup as needed.  Wilmon Arms. Corliss Skains, MD, [facility]  General/ Trauma Surgery  06/28/2013 11:36 AM

## 2013-07-01 ENCOUNTER — Encounter (INDEPENDENT_AMBULATORY_CARE_PROVIDER_SITE_OTHER): Payer: Self-pay | Admitting: Surgery

## 2013-07-16 ENCOUNTER — Encounter (INDEPENDENT_AMBULATORY_CARE_PROVIDER_SITE_OTHER): Payer: Self-pay

## 2015-02-15 IMAGING — CR DG CHEST 2V
2 series · 2 of 2 positions shown · non-contrast
Comparison: None.

CLINICAL DATA: 66-year-old female with shortness of breath.
Preoperative study.  History of pneumonia, bronchitis.

CHEST - 2 VIEW

[w chest pa]
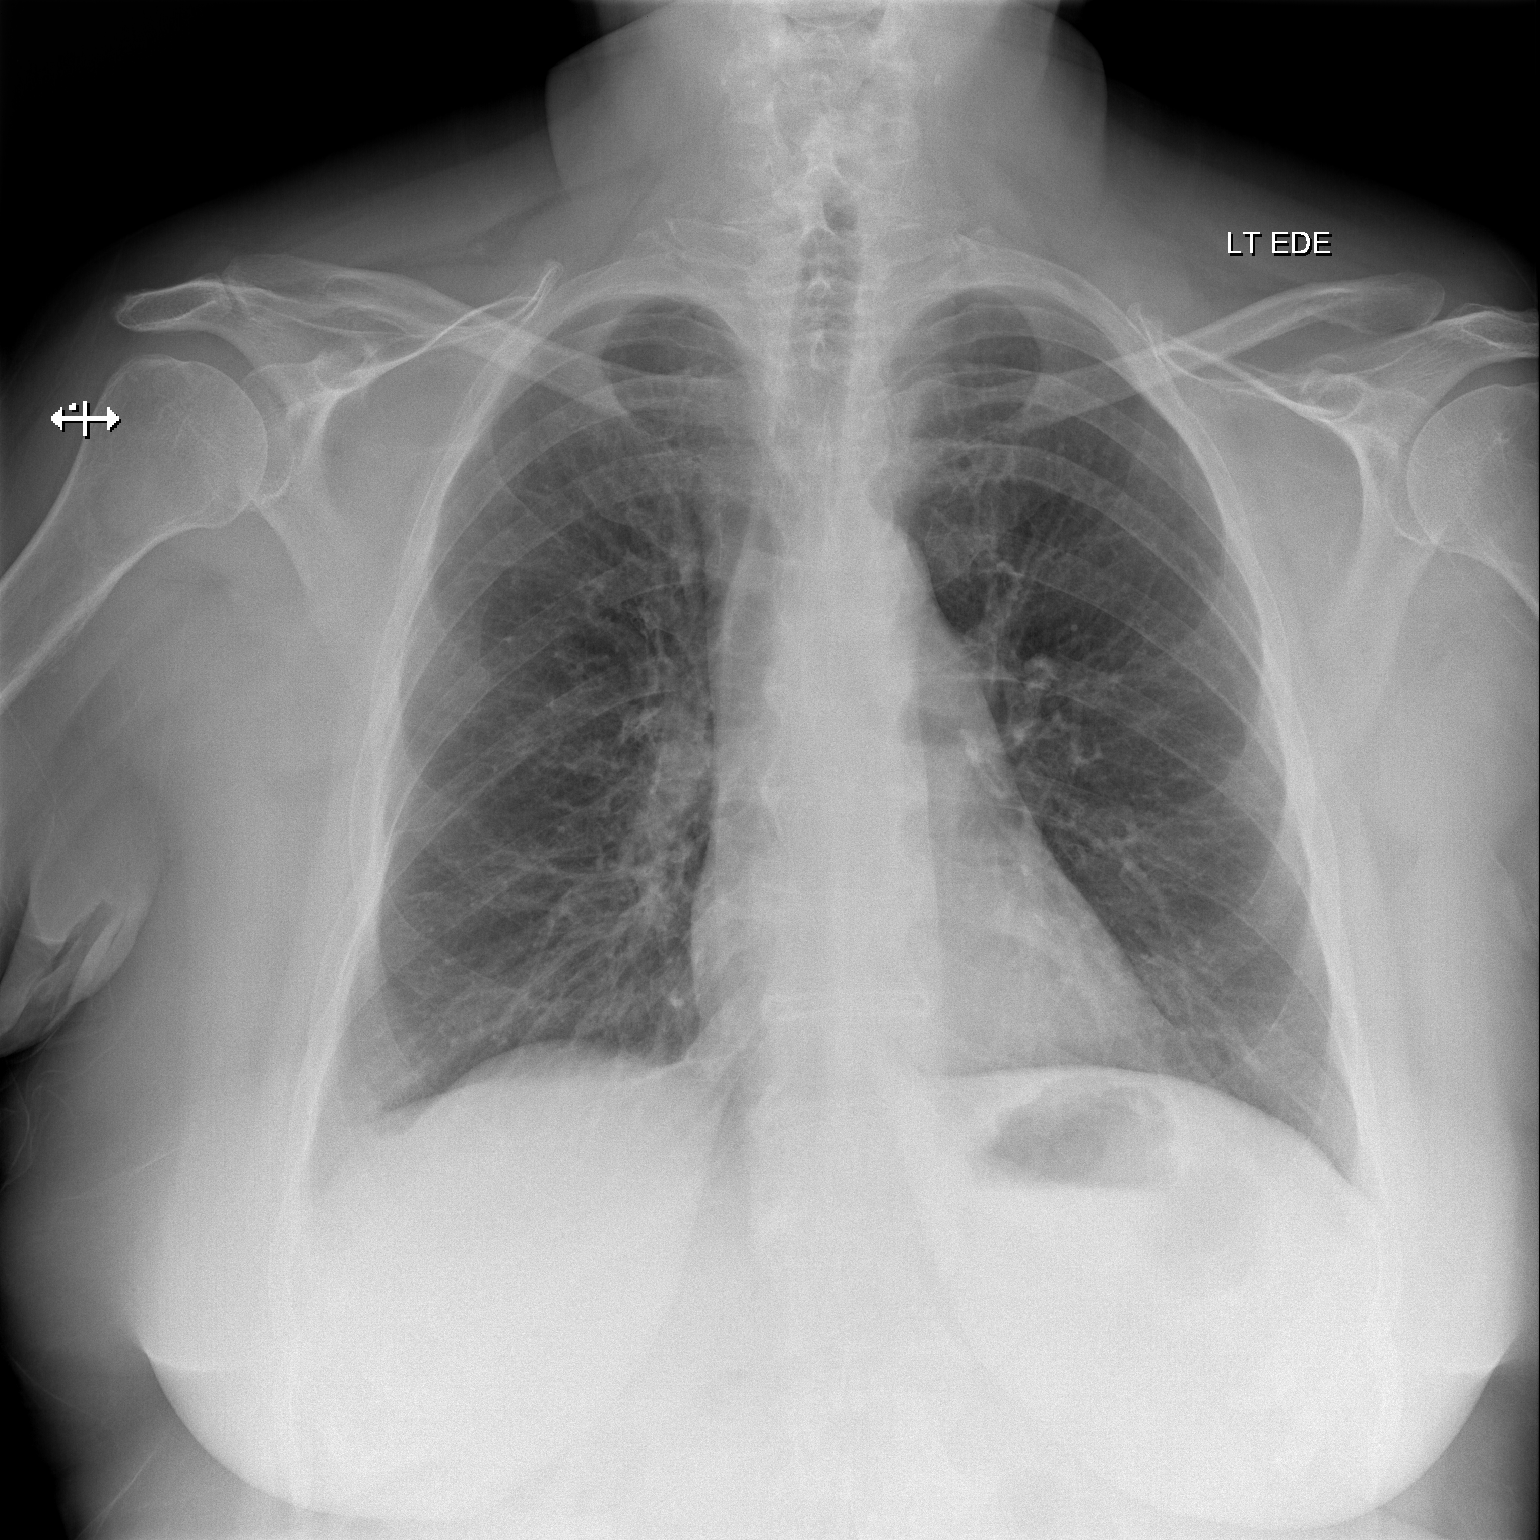

[w chest lat]
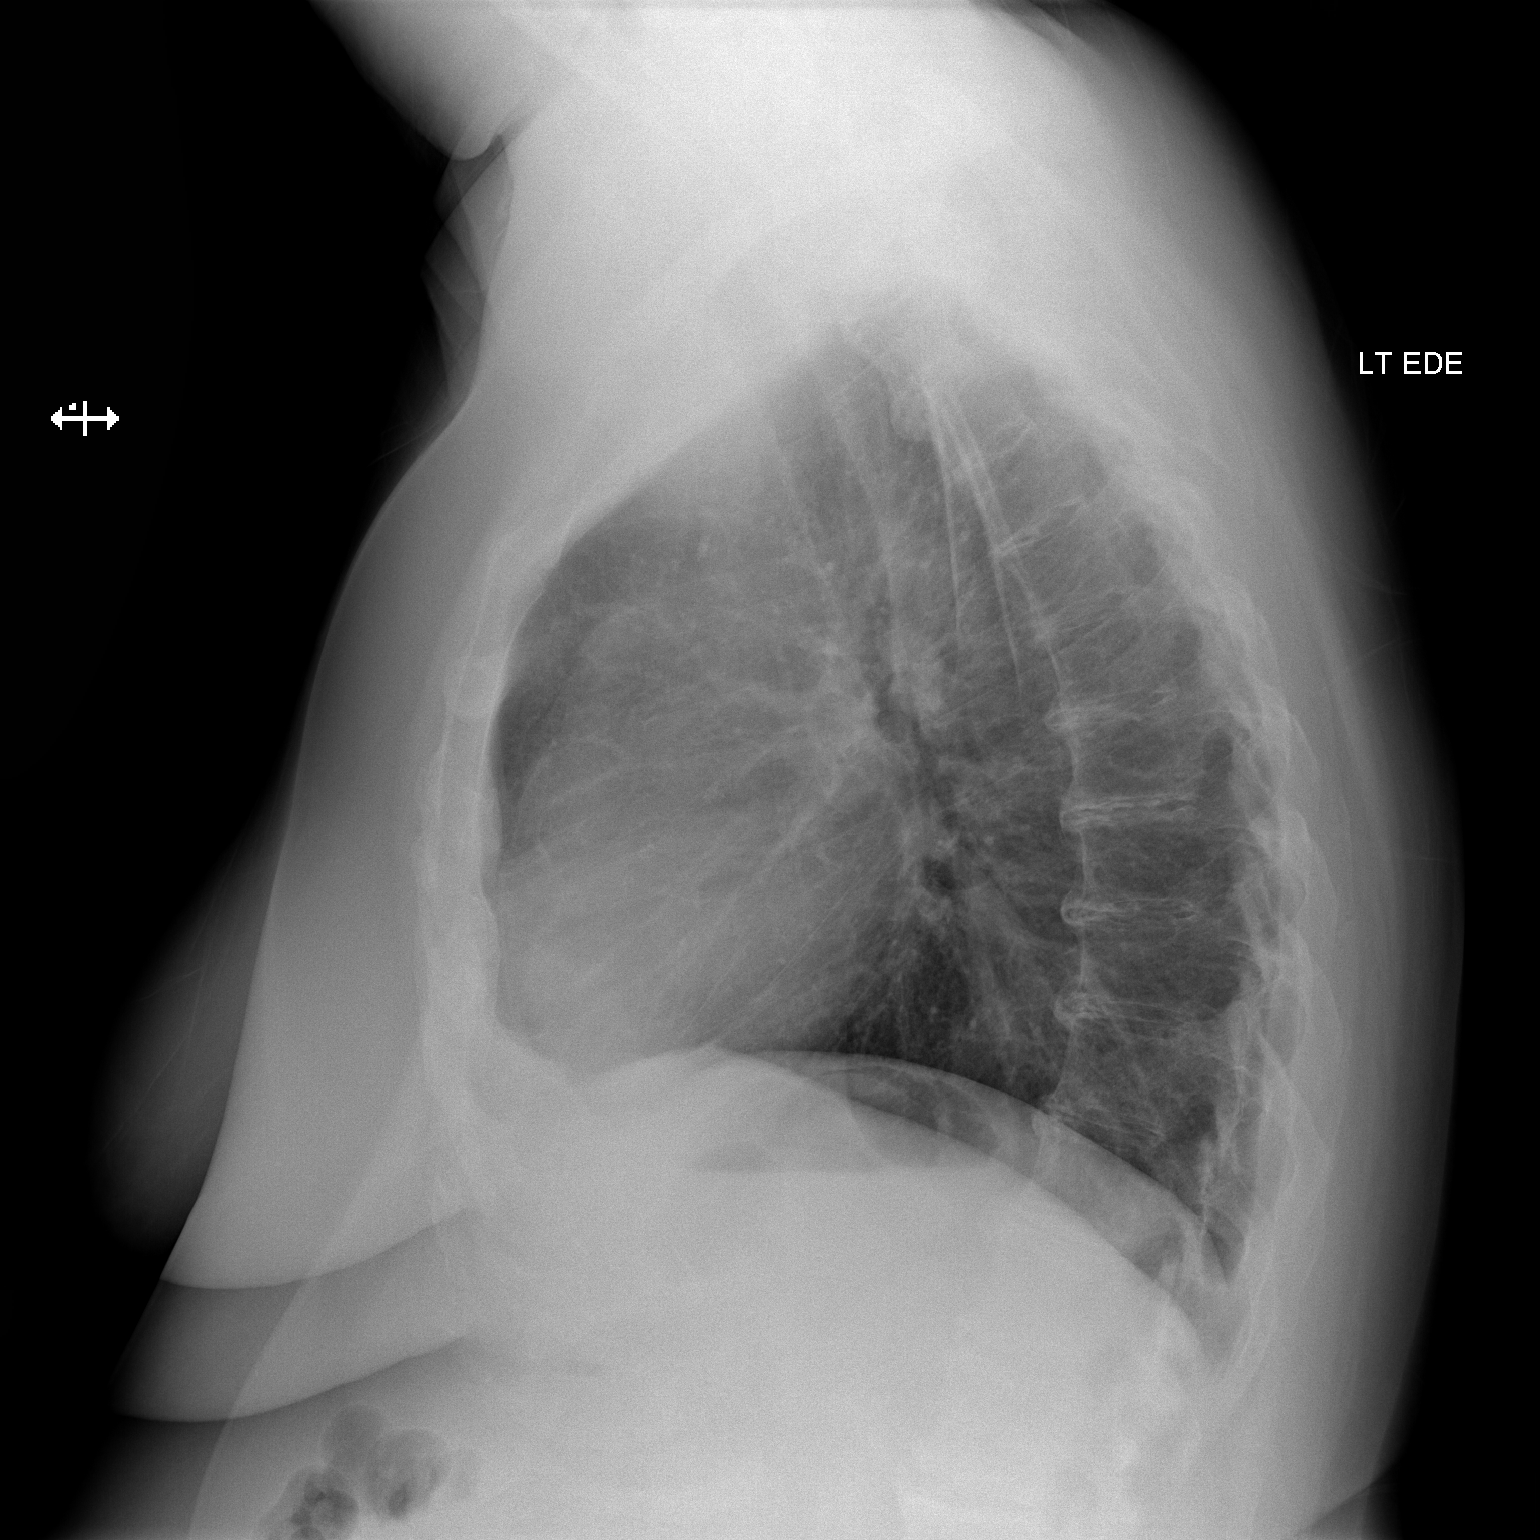

[2 of 2 positions shown; findings below may reference images not displayed]

FINDINGS: Mild increased interstitial markings diffusely, favor
chronic.  Mild eventration of the right hemidiaphragm.  Lung
volumes are within normal limits.  Cardiac size and mediastinal
contours are within normal limits.  Visualized tracheal air column
is within normal limits.  No pneumothorax, pulmonary edema, pleural
effusion or consolidation. No acute osseous abnormality identified.
IMPRESSION: No acute cardiopulmonary abnormality.

## 2020-07-03 ENCOUNTER — Emergency Department (HOSPITAL_COMMUNITY): Payer: Medicare Other

## 2020-07-03 ENCOUNTER — Ambulatory Visit
Admission: EM | Admit: 2020-07-03 | Discharge: 2020-07-03 | Disposition: A | Discharge status: Home / Self Care | Payer: Medicare Other | Attending: Physician Assistant | Admitting: Physician Assistant

## 2020-07-03 ENCOUNTER — Other Ambulatory Visit: Payer: Self-pay

## 2020-07-03 ENCOUNTER — Inpatient Hospital Stay (HOSPITAL_COMMUNITY)
Admission: EM | Admit: 2020-07-03 | Discharge: 2020-07-07 | DRG: 190 | Disposition: A | Discharge status: Home / Self Care | Payer: Medicare Other | Attending: Internal Medicine | Admitting: Internal Medicine

## 2020-07-03 ENCOUNTER — Encounter (HOSPITAL_COMMUNITY): Payer: Self-pay

## 2020-07-03 DIAGNOSIS — J449 Chronic obstructive pulmonary disease, unspecified: Secondary | ICD-10-CM | POA: Diagnosis not present

## 2020-07-03 DIAGNOSIS — Z716 Tobacco abuse counseling: Secondary | ICD-10-CM | POA: Diagnosis not present

## 2020-07-03 DIAGNOSIS — Z90710 Acquired absence of both cervix and uterus: Secondary | ICD-10-CM | POA: Diagnosis not present

## 2020-07-03 DIAGNOSIS — R0682 Tachypnea, not elsewhere classified: Secondary | ICD-10-CM

## 2020-07-03 DIAGNOSIS — E119 Type 2 diabetes mellitus without complications: Secondary | ICD-10-CM

## 2020-07-03 DIAGNOSIS — I5032 Chronic diastolic (congestive) heart failure: Secondary | ICD-10-CM

## 2020-07-03 DIAGNOSIS — D751 Secondary polycythemia: Secondary | ICD-10-CM

## 2020-07-03 DIAGNOSIS — R0602 Shortness of breath: Secondary | ICD-10-CM

## 2020-07-03 DIAGNOSIS — R062 Wheezing: Secondary | ICD-10-CM | POA: Diagnosis not present

## 2020-07-03 DIAGNOSIS — E1169 Type 2 diabetes mellitus with other specified complication: Secondary | ICD-10-CM

## 2020-07-03 DIAGNOSIS — J9602 Acute respiratory failure with hypercapnia: Secondary | ICD-10-CM | POA: Diagnosis not present

## 2020-07-03 DIAGNOSIS — Z20822 Contact with and (suspected) exposure to covid-19: Secondary | ICD-10-CM | POA: Diagnosis present

## 2020-07-03 DIAGNOSIS — J9601 Acute respiratory failure with hypoxia: Secondary | ICD-10-CM | POA: Diagnosis present

## 2020-07-03 DIAGNOSIS — J441 Chronic obstructive pulmonary disease with (acute) exacerbation: Secondary | ICD-10-CM | POA: Diagnosis present

## 2020-07-03 DIAGNOSIS — F17200 Nicotine dependence, unspecified, uncomplicated: Secondary | ICD-10-CM

## 2020-07-03 DIAGNOSIS — Z79899 Other long term (current) drug therapy: Secondary | ICD-10-CM

## 2020-07-03 DIAGNOSIS — Z720 Tobacco use: Secondary | ICD-10-CM

## 2020-07-03 DIAGNOSIS — R0902 Hypoxemia: Secondary | ICD-10-CM | POA: Diagnosis not present

## 2020-07-03 DIAGNOSIS — D696 Thrombocytopenia, unspecified: Secondary | ICD-10-CM | POA: Diagnosis present

## 2020-07-03 DIAGNOSIS — I5033 Acute on chronic diastolic (congestive) heart failure: Secondary | ICD-10-CM

## 2020-07-03 DIAGNOSIS — R7989 Other specified abnormal findings of blood chemistry: Secondary | ICD-10-CM

## 2020-07-03 DIAGNOSIS — Z23 Encounter for immunization: Secondary | ICD-10-CM | POA: Diagnosis not present

## 2020-07-03 DIAGNOSIS — F17210 Nicotine dependence, cigarettes, uncomplicated: Secondary | ICD-10-CM | POA: Diagnosis present

## 2020-07-03 DIAGNOSIS — E785 Hyperlipidemia, unspecified: Secondary | ICD-10-CM

## 2020-07-03 HISTORY — DX: Tobacco use: Z72.0

## 2020-07-03 HISTORY — DX: Other specified abnormal findings of blood chemistry: R79.89

## 2020-07-03 LAB — COMPREHENSIVE METABOLIC PANEL
ALT: 60 U/L — ABNORMAL HIGH (ref 0–44)
AST: 46 U/L — ABNORMAL HIGH (ref 15–41)
Albumin: 4 g/dL (ref 3.5–5.0)
Alkaline Phosphatase: 95 U/L (ref 38–126)
Anion gap: 7 (ref 5–15)
BUN: 11 mg/dL (ref 8–23)
CO2: 35 mmol/L — ABNORMAL HIGH (ref 22–32)
Calcium: 8.3 mg/dL — ABNORMAL LOW (ref 8.9–10.3)
Chloride: 93 mmol/L — ABNORMAL LOW (ref 98–111)
Creatinine, Ser: 0.66 mg/dL (ref 0.44–1.00)
GFR calc Af Amer: >60 mL/min (ref >60)
GFR calc non Af Amer: >60 mL/min (ref >60)
Glucose, Bld: 186 mg/dL — ABNORMAL HIGH (ref 70–99)
Potassium: 4.1 mmol/L (ref 3.5–5.1)
Sodium: 135 mmol/L (ref 135–145)
Total Bilirubin: 0.5 mg/dL (ref 0.3–1.2)
Total Protein: 8 g/dL (ref 6.5–8.1)

## 2020-07-03 LAB — CBC WITH DIFFERENTIAL/PLATELET
Abs Immature Granulocytes: 0.07 10*3/uL (ref 0.00–0.07)
Basophils Absolute: 0 10*3/uL (ref 0.0–0.1)
Basophils Relative: 1 %
Eosinophils Absolute: 0 10*3/uL (ref 0.0–0.5)
Eosinophils Relative: 0 %
HCT: 49.4 % — ABNORMAL HIGH (ref 36.0–46.0)
Hemoglobin: 15.2 g/dL — ABNORMAL HIGH (ref 12.0–15.0)
Immature Granulocytes: 1 %
Lymphocytes Relative: 7 %
Lymphs Abs: 0.6 10*3/uL — ABNORMAL LOW (ref 0.7–4.0)
MCH: 31.3 pg (ref 26.0–34.0)
MCHC: 30.8 g/dL (ref 30.0–36.0)
MCV: 101.6 fL — ABNORMAL HIGH (ref 80.0–100.0)
Monocytes Absolute: 0.2 10*3/uL (ref 0.1–1.0)
Monocytes Relative: 2 %
Neutro Abs: 6.9 10*3/uL (ref 1.7–7.7)
Neutrophils Relative %: 89 %
Platelets: 144 10*3/uL — ABNORMAL LOW (ref 150–400)
RBC: 4.86 MIL/uL (ref 3.87–5.11)
RDW: 14.4 % (ref 11.5–15.5)
WBC: 7.8 10*3/uL (ref 4.0–10.5)
nRBC: 0.5 % — ABNORMAL HIGH (ref 0.0–0.2)

## 2020-07-03 LAB — I-STAT CHEM 8, ED
BUN: 11 mg/dL (ref 8–23)
Calcium, Ion: 1.13 mmol/L — ABNORMAL LOW (ref 1.15–1.40)
Chloride: 93 mmol/L — ABNORMAL LOW (ref 98–111)
Creatinine, Ser: 0.7 mg/dL (ref 0.44–1.00)
Glucose, Bld: 186 mg/dL — ABNORMAL HIGH (ref 70–99)
HCT: 49 % — ABNORMAL HIGH (ref 36.0–46.0)
Hemoglobin: 16.7 g/dL — ABNORMAL HIGH (ref 12.0–15.0)
Potassium: 4.1 mmol/L (ref 3.5–5.1)
Sodium: 140 mmol/L (ref 135–145)
TCO2: 37 mmol/L — ABNORMAL HIGH (ref 22–32)

## 2020-07-03 LAB — BLOOD GAS, VENOUS
Acid-Base Excess: 5.3 mmol/L — ABNORMAL HIGH (ref 0.0–2.0)
Bicarbonate: 36.8 mmol/L — ABNORMAL HIGH (ref 20.0–28.0)
O2 Saturation: 77.3 %
Patient temperature: 98.6
pCO2, Ven: 91.6 mmHg (ref 44.0–60.0)
pH, Ven: 7.228 — ABNORMAL LOW (ref 7.250–7.430)
pO2, Ven: 51.3 mmHg — ABNORMAL HIGH (ref 32.0–45.0)

## 2020-07-03 LAB — BRAIN NATRIURETIC PEPTIDE: B Natriuretic Peptide: 247.4 pg/mL — ABNORMAL HIGH (ref 0.0–100.0)

## 2020-07-03 LAB — SARS CORONAVIRUS 2 BY RT PCR (HOSPITAL ORDER, PERFORMED IN ~~LOC~~ HOSPITAL LAB): SARS Coronavirus 2: NEGATIVE

## 2020-07-03 LAB — TROPONIN I (HIGH SENSITIVITY)
Troponin I (High Sensitivity): 52 ng/L — ABNORMAL HIGH (ref <18)
Troponin I (High Sensitivity): 44 ng/L — ABNORMAL HIGH (ref <18)

## 2020-07-03 MED ORDER — IOHEXOL 350 MG/ML SOLN
100.0000 mL | Freq: Once | INTRAVENOUS | Status: AC | PRN
  Administered 2020-07-03: 100 mL via INTRAVENOUS

## 2020-07-03 MED ORDER — ALBUTEROL SULFATE HFA 108 (90 BASE) MCG/ACT IN AERS
2.0000 | INHALATION_SPRAY | Freq: Once | RESPIRATORY_TRACT | Status: DC
  Filled 2020-07-03: qty 6.7

## 2020-07-03 MED ORDER — SODIUM CHLORIDE 0.9 % IV SOLN: 500.0000 mg | Freq: Once | INTRAVENOUS | Status: DC

## 2020-07-03 MED ORDER — SODIUM CHLORIDE 0.9 % IV SOLN: 1.0000 g | Freq: Once | INTRAVENOUS | Status: DC

## 2020-07-03 MED ORDER — IPRATROPIUM BROMIDE HFA 17 MCG/ACT IN AERS
2.0000 | INHALATION_SPRAY | Freq: Once | RESPIRATORY_TRACT | Status: DC
  Filled 2020-07-03: qty 12.9

## 2020-07-03 MED ORDER — ALBUTEROL SULFATE HFA 108 (90 BASE) MCG/ACT IN AERS
2.0000 | INHALATION_SPRAY | Freq: Once | RESPIRATORY_TRACT | Status: AC
  Administered 2020-07-03: 2 via RESPIRATORY_TRACT

## 2020-07-03 NOTE — ED Triage Notes (Signed)
Pt present SOB, symptoms started 3 days ago. Pt is wheezing, having hard time to breath.

## 2020-07-03 NOTE — H&P (Signed)
History and Physical    Deanna Petty JOI:[id number] DOB: [birth year] DOA: 07/03/2020  PCP: Pcp, No  Patient coming from: Home  I have personally briefly reviewed patient's old medical records in [place]  Chief Complaint: Shortness of breath  HPI: Deanna Petty is a 74 y.o. female with medical history significant of 60-pack-year history but without formal diagnosis of COPD or asthma who developed shortness of breath approximately 3 days ago.  She reports her shortness of breath is worse with exertion.  She has no known Covid exposure and is status post vaccination.  She rarely sees physicians.  She was seen in the urgent care where she was noted to have oxygen saturation in the 60s on room air.  She was brought to the emergency room for further evaluation.  ED Course: In the ED she was noted to be hypercapnic, hypoxic, with an elevated BNP.  She was placed on BiPAP and 4 L and her oxygen saturations were maintained at greater than 90%.  She remained mildly tachypneic.  [facility] is asked to admit her for further work-up and care.  Review of Systems: As per HPI otherwise 10 point review of systems negative.   Past Medical History:  Diagnosis Date  . Bronchitis   . Cigarette smoker   . Pneumonia   . Shortness of breath    with exertion; lasted used inhaler 06/02/13    Past Surgical History:  Procedure Laterality Date  . ABDOMINAL HYSTERECTOMY    . BREAST LUMPECTOMY WITH NEEDLE LOCALIZATION Left 06/12/2013   Procedure: LEFT BREAST LUMPECTOMY WITH NEEDLE LOCALIZATION;  Surgeon: Imogene Burn. Georgette Dover, MD;  Location: [facility];  Service: General;  Laterality: Left;  . BREAST SURGERY       reports that she has been smoking cigarettes. She has a 10.00 pack-year smoking history. She has never used smokeless tobacco. She reports current alcohol use. She reports that she does not use drugs.  Patient reports ongoing tobacco use since the age of 79.  She reports smoking approximately 1 carton per  month which is about 1/3 pack/day.  She is Patient lives with her cousin at present.  There is a son and some grandchildren as well.  No Known Allergies  Family History  Problem Relation Age of Onset  . Cancer Mother        oral, brain  . Diabetes Father     Prior to Admission medications   Medication Sig Start Date End Date Taking? Authorizing Provider  albuterol (PROVENTIL HFA;VENTOLIN HFA) 108 (90 BASE) MCG/ACT inhaler Inhale 2 puffs into the lungs every 6 (six) hours as needed for wheezing or shortness of breath.   Yes [provider]  cholecalciferol (VITAMIN D3) 25 MCG (1000 UNIT) tablet Take 1,000 Units by mouth daily.   Yes [provider]  ibuprofen (ADVIL,MOTRIN) 200 MG tablet Take 400 mg by mouth every 6 (six) hours as needed for pain.   Yes [provider]  Omega-3 1000 MG CAPS Take 1 capsule by mouth daily.   Yes [provider]  HYDROcodone-acetaminophen (NORCO/VICODIN) 5-325 MG per tablet Take 1 tablet by mouth every 4 (four) hours as needed for pain. Patient not taking: Reported on 07/03/2020 06/12/13   Donnie Mesa, MD    Physical Exam: Vitals:   07/03/20 1830 07/03/20 1939 07/03/20 2020 07/03/20 2144  BP: (!) 113/51 (!) 147/76  124/61  Pulse: 88 89 88 77  Resp: (!) 27 (!) 23 (!) 29 (!) 25  Temp:  TempSrc:      SpO2: 99% 96% 93% 91%  Weight:      Height:        Constitutional: NAD, calm, comfortable Eyes: PERRL, lids and conjunctivae normal ENMT: Mucous membranes are moist. Posterior pharynx clear of any exudate or lesions.Normal dentition.  Neck: normal, supple, no masses, no thyromegaly Respiratory: Tachypneic, increased work of breathing, decreased breath sounds throughout no definitive wheezing heard Cardiovascular: Regular rate and rhythm, no murmurs / rubs / gallops. No extremity edema. 2+ pedal pulses. No carotid bruits.  Abdomen: no tenderness, no masses palpated. No hepatosplenomegaly. Bowel sounds positive.   Musculoskeletal: no clubbing / cyanosis. No joint deformity upper and lower extremities. Good ROM, no contractures. Normal muscle tone.  Skin: no rashes, lesions, ulcers. No induration Neurologic: CN 2-12 grossly intact. Sensation intact, DTR normal. Strength 5/5 in all 4.  Psychiatric: Normal judgment and insight. Alert and oriented x 3. Normal mood.   Labs on Admission: I have personally reviewed following labs and imaging studies  CBC: Recent Labs  Lab 07/03/20 1830 07/03/20 1849  WBC 7.8  --   NEUTROABS 6.9  --   HGB 15.2* 16.7*  HCT 49.4* 49.0*  MCV 101.6*  --   PLT 144*  --    Basic Metabolic Panel: Recent Labs  Lab 07/03/20 1830 07/03/20 1849  NA 135 140  K 4.1 4.1  CL 93* 93*  CO2 35*  --   GLUCOSE 186* 186*  BUN 11 11  CREATININE 0.66 0.70  CALCIUM 8.3*  --    GFR: Estimated Creatinine Clearance: 70.7 mL/min (by C-G formula based on SCr of 0.7 mg/dL). Liver Function Tests: Recent Labs  Lab 07/03/20 1830  AST 46*  ALT 60*  ALKPHOS 95  BILITOT 0.5  PROT 8.0  ALBUMIN 4.0    Radiological Exams on Admission: CT Angio Chest PE W and/or Wo Contrast  Result Date: 07/03/2020 CLINICAL DATA:  Shortness of breath x3 days. EXAM: CT ANGIOGRAPHY CHEST WITH CONTRAST TECHNIQUE: Multidetector CT imaging of the chest was performed using the standard protocol during bolus administration of intravenous contrast. Multiplanar CT image reconstructions and MIPs were obtained to evaluate the vascular anatomy. CONTRAST:  139mL OMNIPAQUE IOHEXOL 350 MG/ML SOLN COMPARISON:  None. FINDINGS: Cardiovascular: Moderate to marked severity calcification of the aortic arch is seen. Satisfactory opacification of the pulmonary arteries to the segmental level. No evidence of pulmonary embolism. Normal heart size with moderate severity coronary artery calcification. No pericardial effusion. Mediastinum/Nodes: No enlarged mediastinal, hilar, or axillary lymph nodes. Thyroid gland, trachea, and  esophagus demonstrate no significant findings. Lungs/Pleura: Very mild patchy posteromedial right lower lobe and posteromedial left lower lobe atelectasis and/or early infiltrate is seen. There is no evidence of a pleural effusion or pneumothorax. Upper Abdomen: Subcentimeter gallstones are seen within the gallbladder lumen. Musculoskeletal: A chronic versus congenital deformity is seen along the distal aspect of the right clavicle. Degenerative changes are seen throughout the thoracic spine. Review of the MIP images confirms the above findings. IMPRESSION: 1. No evidence of pulmonary embolism. 2. Very mild patchy posteromedial right lower lobe and posteromedial left lower lobe atelectasis and/or early infiltrate. 3. Cholelithiasis. 4. Chronic versus congenital deformity along the distal aspect of the right clavicle. 5. Aortic atherosclerosis. Aortic Atherosclerosis (ICD10-I70.0). Electronically Signed   By: Virgina Norfolk M.D.   On: 07/03/2020 19:39   DG Chest Port 1 View  Result Date: 07/03/2020 CLINICAL DATA:  Shortness of breath x-ray days, current smoker EXAM: PORTABLE CHEST 1  VIEW. Bilateral costophrenic angles collimated off the COMPARISON:  Chest x-ray 06/03/2013 FINDINGS: The heart size and mediastinal contours are unchanged. Aortic arch calcifications. Bibasilar increased interstitial markings. Hazy airspace opacity within bilateral lower lobes. Limited evaluation for pleural effusion with costophrenic angles collimated off view. No pneumothorax. No acute osseous abnormality. IMPRESSION: 1. Increased interstitial markings and hazy airspace opacities within bilateral lower lung zones suggestive of atypical/viral pneumonia. Differential diagnosis includes pulmonary edema. 2. No definite pleural effusion; however, bilateral costophrenic angles collimated off view and overlying soft tissues. Electronically Signed   By: Iven Finn M.D.   On: 07/03/2020 18:27    EKG: Independently reviewed.  Sinus  rhythm, PACs, right axis deviation, repolarization abnormality that might suggest ischemia.  Assessment/Plan Principal Problem:   Acute hypoxemic respiratory failure (HCC) Active Problems:   Tobacco use   Elevated brain natriuretic peptide (BNP) level  Acute hypoxic respiratory failure Admit to stepdown Unclear etiology, patient is afebrile and has no white count. Covid test is negative Chest x-ray is suggestive of mild interstitial edema and CT rules out PE and suggested there might be right lower lobe and posterior medial left lower lobe atelectasis versus early infiltrate. Check respiratory panel Urinary Legionella antigen Urinary strep pneumo antigen We will rule out infection, check procalcitonin Add antibiotics if needed but will hold on these for now. Could certainly be COPD given lengthy smoking history.  Have added respiratory inhalers to include albuterol and Atrovent and Breo Ellipta. Have held on IV steroids given lack of wheezing but certainly this would be a consideration going forward. There is no obvious COPD findings by x-ray the that certainly does not rule it out. Have given a one-time dose of Lasix and will repeat her BMP in the morning in case this is a pulmonary edema. Will check echo Continue BiPAP Telemetry  Hypercapnia Likely related to respiratory failure and could have hypercapnia COPD type picture.  Ongoing tobacco use Nicotine patch. Encouraged smoking cessation  Elevated BNP Check echo Repeat BMP in the morning  DVT prophylaxis: Lovenox SQ Code Status: Full code although she is considering what she really wants to do.  Initially told me she did not want to be intubated if she is at the end of life but might be amenable if she just needed it temporarily. Family Communication: Patient at bedside Disposition Plan: Home Consults called: None Admission status: Inpatient  Patient is inpatient due to ongoing hypoxia, continued work-up.   Donnamae Jude MD [facility]  If 7PM-7AM, please contact night-coverage 07/03/2020, 10:52 PM

## 2020-07-03 NOTE — ED Notes (Signed)
Pt placed on purewick at 60 mmHg. 

## 2020-07-03 NOTE — ED Provider Notes (Signed)
[facility] DEPT Provider Note   CSN: [id number] Arrival date & time: 07/03/20  1551     History Chief Complaint  Patient presents with  . Shortness of Breath    Deanna Petty is a 74 y.o. female hx of L breast mass, here presenting with shortness of breath.  Patient states that she started having shortness of breath for the last 3 to 4 days.  Patient states that it is worse when she exerts herself.  Patient denies any fevers or chills or cough.  Patient received Moderna Covid vaccine back in July.  Patient went to urgent care was noted to be wheezing.  She also was noted to have oxygen saturation of 63% on room air.  Patient was put on 4 L nasal cannula.  Patient is not on oxygen at baseline. No previous hx of PE.   The history is provided by the patient.       Past Medical History:  Diagnosis Date  . Bronchitis   . Cigarette smoker   . Pneumonia   . Shortness of breath    with exertion; lasted used inhaler 06/02/13    Patient Active Problem List   Diagnosis Date Noted  . Abnormal EKG 06/07/2013  . Left breast mass 05/14/2013    Past Surgical History:  Procedure Laterality Date  . ABDOMINAL HYSTERECTOMY    . BREAST LUMPECTOMY WITH NEEDLE LOCALIZATION Left 06/12/2013   Procedure: LEFT BREAST LUMPECTOMY WITH NEEDLE LOCALIZATION;  Surgeon: Imogene Burn. Georgette Dover, MD;  Location: [facility];  Service: General;  Laterality: Left;  . BREAST SURGERY       OB History   No obstetric history on file.     Family History  Problem Relation Age of Onset  . Cancer Mother        oral, brain  . Diabetes Father     Social History   Tobacco Use  . Smoking status: Current Every Day Smoker    Packs/day: 0.25    Years: 40.00    Pack years: 10.00    Types: Cigarettes  . Smokeless tobacco: Never Used  Vaping Use  . Vaping Use: Never used  Substance Use Topics  . Alcohol use: Yes    Comment: rare  . Drug use: No    Home Medications Prior to Admission  medications   Medication Sig Start Date End Date Taking? Authorizing Provider  albuterol (PROVENTIL HFA;VENTOLIN HFA) 108 (90 BASE) MCG/ACT inhaler Inhale 2 puffs into the lungs every 6 (six) hours as needed for wheezing or shortness of breath.    [provider]  HYDROcodone-acetaminophen (NORCO/VICODIN) 5-325 MG per tablet Take 1 tablet by mouth every 4 (four) hours as needed for pain. 06/12/13   Donnie Mesa, MD  ibuprofen (ADVIL,MOTRIN) 200 MG tablet Take 400 mg by mouth every 6 (six) hours as needed for pain.    [provider]    Allergies    Patient has no known allergies.  Review of Systems   Review of Systems  Respiratory: Positive for shortness of breath.   All other systems reviewed and are negative.   Physical Exam Updated Vital Signs BP 130/62 (BP Location: Left Arm)   Pulse 88   Temp (!) 97.2 F (36.2 C) (Oral)   Resp (!) 26   Ht 5\' 5"  (1.651 m)   Wt 90.7 kg   SpO2 91%   BMI 33.28 kg/m   Physical Exam Vitals and nursing note reviewed.  Constitutional:  Comments: tachypneic   HENT:     Head: Normocephalic.  Eyes:     Pupils: Pupils are equal, round, and reactive to light.  Cardiovascular:     Rate and Rhythm: Normal rate and regular rhythm.  Pulmonary:     Comments: Diminished throughout,  Abdominal:     General: Bowel sounds are normal.     Palpations: Abdomen is soft.  Musculoskeletal:        General: Normal range of motion.     Cervical back: Normal range of motion.  Skin:    General: Skin is warm.     Capillary Refill: Capillary refill takes less than 2 seconds.  Neurological:     General: No focal deficit present.     Mental Status: She is oriented to person, place, and time.  Psychiatric:        Mood and Affect: Mood normal.        Behavior: Behavior normal.     ED Results / Procedures / Treatments   Labs (all labs ordered are listed, but only abnormal results are displayed) Labs Reviewed  SARS CORONAVIRUS 2 BY  RT PCR ([facility])  CBC WITH DIFFERENTIAL/PLATELET  COMPREHENSIVE METABOLIC PANEL  BRAIN NATRIURETIC PEPTIDE  BLOOD GAS, VENOUS  I-STAT CHEM 8, ED  TROPONIN I (HIGH SENSITIVITY)    EKG None  Radiology No results found.  Procedures Procedures (including critical care time)  CRITICAL CARE Performed by: Wandra Arthurs   Total critical care time: 30 minutes  Critical care time was exclusive of separately billable procedures and treating other patients.  Critical care was necessary to treat or prevent imminent or life-threatening deterioration.  Critical care was time spent personally by me on the following activities: development of treatment plan with patient and/or surrogate as well as nursing, discussions with consultants, evaluation of patient's response to treatment, examination of patient, obtaining history from patient or surrogate, ordering and performing treatments and interventions, ordering and review of laboratory studies, ordering and review of radiographic studies, pulse oximetry and re-evaluation of patient's condition.   Medications Ordered in ED Medications  albuterol (VENTOLIN HFA) 108 (90 Base) MCG/ACT inhaler 2 puff (has no administration in time range)  ipratropium (ATROVENT HFA) inhaler 2 puff (has no administration in time range)    ED Course  I have reviewed the triage vital signs and the nursing notes.  Pertinent labs & imaging results that were available during my care of the patient were reviewed by me and considered in my medical decision making (see chart for details).    MDM Rules/Calculators/A&P                         Deanna Petty is a 74 y.o. female here with shortness of breath.  Considered asthma versus Covid versus PE.  Patient also has ischemic changes on her EKG so consider ACS as well. Plan to get CBC, CMP, troponin, CTA chest. Will need admission.   8:07 PM WBC is nl. VBG showed pH 7.22, CO2  91. CXR clear. CTA showed possible atelectasis vs pneumonia but she has no infectious symptoms or leukocytosis so will hold off on abx. Will put on bipap for respiratory acidosis from likely asthma. Will admit.   Final Clinical Impression(s) / ED Diagnoses Final diagnoses:  SOB (shortness of breath)    Rx / DC Orders ED Discharge Orders    None  Drenda Freeze, MD 07/03/20 2009

## 2020-07-03 NOTE — Progress Notes (Signed)
Pt being put on bipap. Unable to answer questions at present time on the nursing admission history. Lucius Conn BSN, RN-BC Admissions RN 07/03/2020 8:23 PM

## 2020-07-03 NOTE — ED Provider Notes (Signed)
74 year old female comes in for 3-day history of shortness of breath, wheezing.  Denies any fevers.  Patient arrived in the center with increased work of breathing, tachypnea, audible wheezing.   At triage, patient tachypneic at 26, hypoxic at 63%.  She was put on 4 L of oxygen with slowly improved O2 sat. Temp 97.5, HR 101  Constitutional: nontoxic. Heart: RRR Lungs: decreased air movement, no obvious adventitious lung sounds.  O2 sat 83% Albuterol 4 puffs x1: Improved air movement, inspiratory and expiratory wheezes to the upper fields, still with decreased air  movement to lower fields. O2 sat on 4L now to 95% Albuterol 4 puffs x 2: no change in exam. No significant improvement of symptoms. O2 at 95% Neuro: alert and oriented x 4  Given significantly improved O2 sat after albuterol, attempted to lower O2 to 2L. Patient desats to 80% on 2L O2. Will increase back to 4L and transport to ED for further evaluation.    Ok Edwards, PA-C 07/03/20 1500

## 2020-07-03 NOTE — ED Triage Notes (Signed)
Per EMS- Patient was picked up at a [facility]. patient c/o SOB x 3 days.  Patient had room air sats in the 60's. Patient was placed on O2 4L/min via [place]. Sats increased to 98%.  Patient used her Albuterol inhaler prior to arrival to the ED. EMS gave the patient Solumedrol 125 mg IV prior to coming to the ED

## 2020-07-04 ENCOUNTER — Inpatient Hospital Stay (HOSPITAL_COMMUNITY): Payer: Medicare Other

## 2020-07-04 DIAGNOSIS — Z720 Tobacco use: Secondary | ICD-10-CM

## 2020-07-04 DIAGNOSIS — R7989 Other specified abnormal findings of blood chemistry: Secondary | ICD-10-CM

## 2020-07-04 DIAGNOSIS — J9601 Acute respiratory failure with hypoxia: Secondary | ICD-10-CM

## 2020-07-04 DIAGNOSIS — J9602 Acute respiratory failure with hypercapnia: Secondary | ICD-10-CM

## 2020-07-04 LAB — BASIC METABOLIC PANEL
Anion gap: 11 (ref 5–15)
BUN: 11 mg/dL (ref 8–23)
CO2: 34 mmol/L — ABNORMAL HIGH (ref 22–32)
Calcium: 8.9 mg/dL (ref 8.9–10.3)
Chloride: 94 mmol/L — ABNORMAL LOW (ref 98–111)
Creatinine, Ser: 0.69 mg/dL (ref 0.44–1.00)
GFR calc Af Amer: >60 mL/min (ref >60)
GFR calc non Af Amer: >60 mL/min (ref >60)
Glucose, Bld: 171 mg/dL — ABNORMAL HIGH (ref 70–99)
Potassium: 5.4 mmol/L — ABNORMAL HIGH (ref 3.5–5.1)
Sodium: 139 mmol/L (ref 135–145)

## 2020-07-04 LAB — POCT I-STAT 7, (LYTES, BLD GAS, ICA,H+H)
Acid-Base Excess: 13 mmol/L — ABNORMAL HIGH (ref 0.0–2.0)
Bicarbonate: 41.5 mmol/L — ABNORMAL HIGH (ref 20.0–28.0)
Calcium, Ion: 1.17 mmol/L (ref 1.15–1.40)
HCT: 46 % (ref 36.0–46.0)
Hemoglobin: 15.6 g/dL — ABNORMAL HIGH (ref 12.0–15.0)
O2 Saturation: 95 %
Patient temperature: 98
Potassium: 4.5 mmol/L (ref 3.5–5.1)
Sodium: 140 mmol/L (ref 135–145)
TCO2: 44 mmol/L — ABNORMAL HIGH (ref 22–32)
pCO2 arterial: 68.2 mmHg (ref 32.0–48.0)
pH, Arterial: 7.391 (ref 7.350–7.450)
pO2, Arterial: 78 mmHg — ABNORMAL LOW (ref 83.0–108.0)

## 2020-07-04 LAB — RESPIRATORY PANEL BY PCR

## 2020-07-04 LAB — MRSA PCR SCREENING: MRSA by PCR: NEGATIVE

## 2020-07-04 LAB — ECHOCARDIOGRAM COMPLETE
Area-P 1/2: 3.08 cm2
Height: 65 in
S' Lateral: 2.7 cm
Weight: 3200 oz

## 2020-07-04 LAB — PROCALCITONIN: Procalcitonin: <0.1 ng/mL

## 2020-07-04 LAB — CBC
HCT: 49.5 % — ABNORMAL HIGH (ref 36.0–46.0)
Hemoglobin: 15.3 g/dL — ABNORMAL HIGH (ref 12.0–15.0)
MCH: 31.4 pg (ref 26.0–34.0)
MCHC: 30.9 g/dL (ref 30.0–36.0)
MCV: 101.6 fL — ABNORMAL HIGH (ref 80.0–100.0)
Platelets: 130 10*3/uL — ABNORMAL LOW (ref 150–400)
RBC: 4.87 MIL/uL (ref 3.87–5.11)
RDW: 14.3 % (ref 11.5–15.5)
WBC: 6.4 10*3/uL (ref 4.0–10.5)
nRBC: 0.3 % — ABNORMAL HIGH (ref 0.0–0.2)

## 2020-07-04 LAB — BRAIN NATRIURETIC PEPTIDE: B Natriuretic Peptide: 248.3 pg/mL — ABNORMAL HIGH (ref 0.0–100.0)

## 2020-07-04 LAB — TSH: TSH: 1.366 u[IU]/mL (ref 0.350–4.500)

## 2020-07-04 LAB — HEMOGLOBIN A1C
Hgb A1c MFr Bld: 6.8 % — ABNORMAL HIGH (ref 4.8–5.6)
Mean Plasma Glucose: 148.46 mg/dL

## 2020-07-04 LAB — HIV ANTIBODY (ROUTINE TESTING W REFLEX): HIV Screen 4th Generation wRfx: NONREACTIVE

## 2020-07-04 MED ORDER — ACETAMINOPHEN 650 MG RE SUPP: 650.0000 mg | Freq: Four times a day (QID) | RECTAL | Status: DC | PRN

## 2020-07-04 MED ORDER — AZITHROMYCIN 250 MG PO TABS
500.0000 mg | ORAL_TABLET | Freq: Every day | ORAL | Status: DC
  Administered 2020-07-04 – 2020-07-07 (×4): 500 mg via ORAL
  Filled 2020-07-04 (×5): qty 2

## 2020-07-04 MED ORDER — TRAZODONE HCL 50 MG PO TABS: 25.0000 mg | ORAL_TABLET | Freq: Every evening | ORAL | Status: DC | PRN

## 2020-07-04 MED ORDER — NICOTINE 14 MG/24HR TD PT24
14.0000 mg | MEDICATED_PATCH | Freq: Every day | TRANSDERMAL | Status: DC
  Administered 2020-07-04 – 2020-07-07 (×4): 14 mg via TRANSDERMAL
  Filled 2020-07-04 (×4): qty 1

## 2020-07-04 MED ORDER — IPRATROPIUM BROMIDE HFA 17 MCG/ACT IN AERS
2.0000 | INHALATION_SPRAY | Freq: Three times a day (TID) | RESPIRATORY_TRACT | Status: DC
  Administered 2020-07-04 – 2020-07-06 (×5): 2 via RESPIRATORY_TRACT
  Filled 2020-07-04: qty 12.9

## 2020-07-04 MED ORDER — CHLORHEXIDINE GLUCONATE 0.12 % MT SOLN
15.0000 mL | Freq: Two times a day (BID) | OROMUCOSAL | Status: DC
  Administered 2020-07-04 – 2020-07-05 (×4): 15 mL via OROMUCOSAL
  Filled 2020-07-04 (×3): qty 15

## 2020-07-04 MED ORDER — ENOXAPARIN SODIUM 40 MG/0.4ML ~~LOC~~ SOLN
40.0000 mg | SUBCUTANEOUS | Status: DC
  Administered 2020-07-04 – 2020-07-07 (×4): 40 mg via SUBCUTANEOUS
  Filled 2020-07-04 (×4): qty 0.4

## 2020-07-04 MED ORDER — IPRATROPIUM BROMIDE HFA 17 MCG/ACT IN AERS
2.0000 | INHALATION_SPRAY | RESPIRATORY_TRACT | Status: DC
  Administered 2020-07-04: 2 via RESPIRATORY_TRACT
  Filled 2020-07-04: qty 12.9

## 2020-07-04 MED ORDER — ALBUTEROL SULFATE HFA 108 (90 BASE) MCG/ACT IN AERS
2.0000 | INHALATION_SPRAY | Freq: Four times a day (QID) | RESPIRATORY_TRACT | Status: DC | PRN
  Filled 2020-07-04: qty 6.7

## 2020-07-04 MED ORDER — ORAL CARE MOUTH RINSE
15.0000 mL | Freq: Two times a day (BID) | OROMUCOSAL | Status: DC
  Administered 2020-07-04 – 2020-07-05 (×4): 15 mL via OROMUCOSAL

## 2020-07-04 MED ORDER — FLUTICASONE FUROATE-VILANTEROL 100-25 MCG/INH IN AEPB
1.0000 | INHALATION_SPRAY | Freq: Every day | RESPIRATORY_TRACT | Status: DC
  Administered 2020-07-05 – 2020-07-07 (×3): 1 via RESPIRATORY_TRACT
  Filled 2020-07-04: qty 28

## 2020-07-04 MED ORDER — CHLORHEXIDINE GLUCONATE CLOTH 2 % EX PADS
6.0000 | MEDICATED_PAD | Freq: Every day | CUTANEOUS | Status: DC
  Administered 2020-07-04 – 2020-07-05 (×2): 6 via TOPICAL

## 2020-07-04 MED ORDER — INFLUENZA VAC A&B SA ADJ QUAD 0.5 ML IM PRSY
0.5000 mL | PREFILLED_SYRINGE | INTRAMUSCULAR | Status: AC
  Administered 2020-07-05: 0.5 mL via INTRAMUSCULAR
  Filled 2020-07-04: qty 0.5

## 2020-07-04 MED ORDER — PNEUMOCOCCAL VAC POLYVALENT 25 MCG/0.5ML IJ INJ
0.5000 mL | INJECTION | INTRAMUSCULAR | Status: AC
  Administered 2020-07-07: 0.5 mL via INTRAMUSCULAR
  Filled 2020-07-04 (×3): qty 0.5

## 2020-07-04 MED ORDER — METOPROLOL TARTRATE 5 MG/5ML IV SOLN: 5.0000 mg | Freq: Four times a day (QID) | INTRAVENOUS | Status: DC | PRN

## 2020-07-04 MED ORDER — POLYETHYLENE GLYCOL 3350 17 G PO PACK: 17.0000 g | PACK | Freq: Every day | ORAL | Status: DC | PRN

## 2020-07-04 MED ORDER — ACETAMINOPHEN 325 MG PO TABS: 650.0000 mg | ORAL_TABLET | Freq: Four times a day (QID) | ORAL | Status: DC | PRN

## 2020-07-04 MED ORDER — FUROSEMIDE 10 MG/ML IJ SOLN
40.0000 mg | Freq: Once | INTRAMUSCULAR | Status: AC
  Administered 2020-07-04: 40 mg via INTRAVENOUS
  Filled 2020-07-04: qty 4

## 2020-07-04 NOTE — Plan of Care (Signed)

## 2020-07-04 NOTE — Progress Notes (Addendum)
PROGRESS NOTE  TARONDA COMACHO XKP:[id number] DOB: 1945-11-10 DOA: 07/03/2020 PCP: Pcp, No   LOS: 1 day   Brief narrative: As per HPI,  Deanna Petty is a 74 y.o. female with medical history significant for 60-pack-year history without formal history of COPD or asthma presented to hospital with shortness of breath for 3 days with exertion.  Patient was noted to be hypoxic at home saturating in the 60s and was brought in hospital for further evaluation and treatment.   In the ED, she was noted to be hypercapnic, hypoxic, with an elevated BNP.  She was placed on BiPAP and 4 L and her oxygen saturations were maintained at greater than 90%.  She remained mildly tachypneic.  Patient was then considered for admission to hospital.  Assessment/Plan:  Principal Problem:   Acute hypoxemic respiratory failure (HCC) Active Problems:   Tobacco use   Elevated brain natriuretic peptide (BNP) level  Acute hypoxic and hypercapnic respiratory failure, present on admission.  Patient today stated that she was on inhalers in the past and has ran out of it.  Likely she has chronic COPD with acute exacerbation this time with possible obesity hypoventilation syndrome. Patient has longstanding smoking history and has been smoking for the last 60 years. Chest x-ray showing mild interstitial edema/atelectasis or early infiltrate.  No evidence of pulmonary embolism on CT scan..  Continue albuterol Atrovent and Breo Ellipta.  Received 1 dose of IV Lasix.  Patient initially received BiPAP.  Currently off BiPAP on nasal cannula oxygen. No leukocytosis.  BNP 248.  Pro-calcitonin was less than 0.1.  MRSA screen was negative.  HIV pending.  Initial ABG was PCO2 of 91 with pH of 7.2.  Repeat ABG done today shows pH of 7.39 with PCO2 of 68 and PO2 of 78 with bicarb at 41.  2D echocardiogram showed normal LV ejection fraction with diastolic dysfunction and right ventricular systolic function was preserved.  Elevated MCV anemia  TSH  of 1.3 and within normal range.   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  Smoking cessation was extensively discussed with the patient.  Ongoing tobacco use Continue nicotine patch.  Encouraged cessation of smoking.  Extensive counseling was done. She is motivated to quit. Will consider nicotine patch and lozenge on discharge  DVT prophylaxis: enoxaparin (LOVENOX) injection 40 mg Start: 07/04/20 1000   Code Status: Full code  Family Communication: None today.   Status is: Inpatient  Remains inpatient appropriate because:IV treatments appropriate due to intensity of illness or inability to take PO and Inpatient level of care appropriate due to severity of illness   Dispo: The patient is from: Home              Anticipated d/c is to: Home              Anticipated d/c date is: 1-2 days.               Patient currently is not medically stable to d/c.  Consultants:  None  Procedures:  BiPAP placement  Antibiotics:  . Azithromycin PO  Subjective: Today, patient was seen and examined at bedside. Patient complains of mild cough which is productive. Denies any chest pain palpitation.  Objective: Vitals:   07/04/20 0500 07/04/20 0600  BP: 136/65 (!) 132/52  Pulse: 68 74  Resp: (!) 32 (!) 21  Temp:    SpO2: 97% 96%    Intake/Output Summary (Last 24 hours) at 07/04/2020 8675 Last data filed  at 07/04/2020 0400 Gross per 24 hour  Intake --  Output 800 ml  Net -800 ml   Filed Weights   07/03/20 1611  Weight: 90.7 kg   Body mass index is 33.28 kg/m.   Physical Exam: GENERAL: Patient is alert awake and oriented. Not in obvious distress.  Obese, on BiPAP HENT: No scleral pallor or icterus. Pupils equally reactive to light. Oral mucosa is moist NECK: is supple, no gross swelling noted. CHEST: Diminished breath sounds bilaterally. Occasional expiratory wheezes noted CVS: S1 and S2 heard, no murmur. Regular rate and rhythm.  ABDOMEN: Soft,  non-tender, bowel sounds are present. EXTREMITIES: Trace edema CNS: Cranial nerves are intact. No focal motor deficits. SKIN: warm and dry without rashes.  Data Review: I have personally reviewed the following laboratory data and studies,  CBC: Recent Labs  Lab 07/03/20 1830 07/03/20 1849 07/04/20 0316  WBC 7.8  --  6.4  NEUTROABS 6.9  --   --   HGB 15.2* 16.7* 15.3*  HCT 49.4* 49.0* 49.5*  MCV 101.6*  --  101.6*  PLT 144*  --  664*   Basic Metabolic Panel: Recent Labs  Lab 07/03/20 1830 07/03/20 1849 07/04/20 0316  NA 135 140 139  K 4.1 4.1 5.4*  CL 93* 93* 94*  CO2 35*  --  34*  GLUCOSE 186* 186* 171*  BUN 11 11 11   CREATININE 0.66 0.70 0.69  CALCIUM 8.3*  --  8.9   Liver Function Tests: Recent Labs  Lab 07/03/20 1830  AST 46*  ALT 60*  ALKPHOS 95  BILITOT 0.5  PROT 8.0  ALBUMIN 4.0   No results for input(s): LIPASE, AMYLASE in the last 168 hours. No results for input(s): AMMONIA in the last 168 hours. Cardiac Enzymes: No results for input(s): CKTOTAL, CKMB, CKMBINDEX, TROPONINI in the last 168 hours. BNP (last 3 results) Recent Labs    07/03/20 1830 07/04/20 0316  BNP 247.4* 248.3*    ProBNP (last 3 results) No results for input(s): PROBNP in the last 8760 hours.  CBG: No results for input(s): GLUCAP in the last 168 hours. Recent Results (from the past 240 hour(s))  SARS Coronavirus 2 by RT PCR (hospital order, performed in [facility] hospital lab) Nasopharyngeal Nasopharyngeal Swab     Status: None   Collection Time: 07/03/20  6:12 PM   Specimen: Nasopharyngeal Swab  Result Value Ref Range Status   SARS Coronavirus 2 NEGATIVE NEGATIVE Final    Comment: (NOTE) SARS-CoV-2 target nucleic acids are NOT DETECTED.  The SARS-CoV-2 RNA is generally detectable in upper and lower respiratory specimens during the acute phase of infection. The lowest concentration of SARS-CoV-2 viral copies this assay can detect is 250 copies / mL. A negative result  does not preclude SARS-CoV-2 infection and should not be used as the sole basis for treatment or other patient management decisions.  A negative result may occur with improper specimen collection / handling, submission of specimen other than nasopharyngeal swab, presence of viral mutation(s) within the areas targeted by this assay, and inadequate number of viral copies (<250 copies / mL). A negative result must be combined with clinical observations, patient history, and epidemiological information.  Fact Sheet for Patients:   StrictlyIdeas.no  Fact Sheet for Healthcare Providers: BankingDealers.co.za  This test is not yet approved or  cleared by the [place] FDA and has been authorized for detection and/or diagnosis of SARS-CoV-2 by FDA under an Emergency Use Authorization (EUA).  This EUA will remain  in effect (meaning this test can be used) for the duration of the COVID-19 declaration under Section 564(b)(1) of the Act, 21 U.S.C. section 360bbb-3(b)(1), unless the authorization is terminated or revoked sooner.  Performed at [facility], [place] [address], [place], [place] [address]   MRSA PCR Screening     Status: None   Collection Time: 07/03/20 11:57 PM   Specimen: Nasal Mucosa; Nasopharyngeal  Result Value Ref Range Status   MRSA by PCR NEGATIVE NEGATIVE Final    Comment:        The GeneXpert MRSA Assay (FDA approved for NASAL specimens only), is one component of a comprehensive MRSA colonization surveillance program. It is not intended to diagnose MRSA infection nor to guide or monitor treatment for MRSA infections. Performed at [facility], [place] [address], [place], [place] [address]      Studies: CT Angio Chest PE W and/or Wo Contrast  Result Date: 07/03/2020 CLINICAL DATA:  Shortness of breath x3 days. EXAM: CT ANGIOGRAPHY CHEST WITH CONTRAST TECHNIQUE: Multidetector CT  imaging of the chest was performed using the standard protocol during bolus administration of intravenous contrast. Multiplanar CT image reconstructions and MIPs were obtained to evaluate the vascular anatomy. CONTRAST:  131mL OMNIPAQUE IOHEXOL 350 MG/ML SOLN COMPARISON:  None. FINDINGS: Cardiovascular: Moderate to marked severity calcification of the aortic arch is seen. Satisfactory opacification of the pulmonary arteries to the segmental level. No evidence of pulmonary embolism. Normal heart size with moderate severity coronary artery calcification. No pericardial effusion. Mediastinum/Nodes: No enlarged mediastinal, hilar, or axillary lymph nodes. Thyroid gland, trachea, and esophagus demonstrate no significant findings. Lungs/Pleura: Very mild patchy posteromedial right lower lobe and posteromedial left lower lobe atelectasis and/or early infiltrate is seen. There is no evidence of a pleural effusion or pneumothorax. Upper Abdomen: Subcentimeter gallstones are seen within the gallbladder lumen. Musculoskeletal: A chronic versus congenital deformity is seen along the distal aspect of the right clavicle. Degenerative changes are seen throughout the thoracic spine. Review of the MIP images confirms the above findings. IMPRESSION: 1. No evidence of pulmonary embolism. 2. Very mild patchy posteromedial right lower lobe and posteromedial left lower lobe atelectasis and/or early infiltrate. 3. Cholelithiasis. 4. Chronic versus congenital deformity along the distal aspect of the right clavicle. 5. Aortic atherosclerosis. Aortic Atherosclerosis (ICD10-I70.0). Electronically Signed   By: Virgina Norfolk M.D.   On: 07/03/2020 19:39   DG Chest Port 1 View  Result Date: 07/03/2020 CLINICAL DATA:  Shortness of breath x-ray days, current smoker EXAM: PORTABLE CHEST 1 VIEW. Bilateral costophrenic angles collimated off the COMPARISON:  Chest x-ray 06/03/2013 FINDINGS: The heart size and mediastinal contours are unchanged.  Aortic arch calcifications. Bibasilar increased interstitial markings. Hazy airspace opacity within bilateral lower lobes. Limited evaluation for pleural effusion with costophrenic angles collimated off view. No pneumothorax. No acute osseous abnormality. IMPRESSION: 1. Increased interstitial markings and hazy airspace opacities within bilateral lower lung zones suggestive of atypical/viral pneumonia. Differential diagnosis includes pulmonary edema. 2. No definite pleural effusion; however, bilateral costophrenic angles collimated off view and overlying soft tissues. Electronically Signed   By: Iven Finn M.D.   On: 07/03/2020 18:27      Flora Lipps, MD  [facility] 07/04/2020

## 2020-07-04 NOTE — Progress Notes (Signed)
Initial Nutrition Assessment  RD working remotely.  DOCUMENTATION CODES:   Obesity unspecified  INTERVENTION:  - diet advancement as medically feasible. - will complete NFPE at follow-up.   NUTRITION DIAGNOSIS:   Increased nutrient needs related to acute illness as evidenced by estimated needs.  GOAL:   Patient will meet greater than or equal to 90% of their needs  MONITOR:   Diet advancement, Labs, Weight trends  REASON FOR ASSESSMENT:   Consult Assessment of nutrition requirement/status  ASSESSMENT:   74 y.o. female with medical history of 60-pack-year history but without formal diagnosis of COPD or asthma who developed shortness of breath approximately 3 days PTA (admitted 9/17); worse with exertion. She has had COVID vaccinations. She was seen at urgent care where she was noted to have O2 saturation in the 60s on room air and was transported to the ED.  Patient has been NPO since admission. She is noted to currently be on BiPAP; unable to reach patient by phone. She has not been assessed by a [place] RD at any time in the past.  Weight yesterday was documented as 200 lb, which appears to be a stated weight. Prior to this, the most recently documented weight was on 06/28/13 when she weighed 202 lb.   Per notes: - acute hypoxic, hypercapnic respiratory failure - COVID negative   Labs reviewed; K: 5.4 mmol/l, Cl: 94 mmol/l, ionized Ca: 1.13 mmol/l.  Medications reviewed; 40 mg IV lasix x1 dose 9/18.     NUTRITION - FOCUSED PHYSICAL EXAM:  unable to complete at this time.   Diet Order:   Diet Order    None      EDUCATION NEEDS:   No education needs have been identified at this time  Skin:  Skin Assessment: Reviewed RN Assessment  Last BM:  PTA/unknown  Height:   Ht Readings from Last 1 Encounters:  07/03/20 5\' 5"  (1.651 m)    Weight:   Wt Readings from Last 1 Encounters:  07/03/20 90.7 kg    Estimated Nutritional Needs:  Kcal:  1725-1905  kcal Protein:  90-100 grams Fluid:  >/= 1.8 L/day     Jarome Matin, MS, RD, LDN, CNSC Inpatient Clinical Dietitian RD pager # available in [facility]  After hours/weekend pager # available in [facility]

## 2020-07-04 NOTE — Progress Notes (Signed)
  Echocardiogram 2D Echocardiogram has been performed.  Randa Lynn Errin Chewning 07/04/2020, 8:42 AM

## 2020-07-04 NOTE — Progress Notes (Signed)
PT Cancellation Note  Patient Details Name: Deanna Petty MRN: [id number] DOB: Jul 19, 1946   Cancelled Treatment:    Reason Eval/Treat Not Completed: Medical issues which prohibited therapy--pt currently on BIPAP. Will check back another day.    [facility]  Office: [phone] Pager: [phone]

## 2020-07-05 DIAGNOSIS — R0902 Hypoxemia: Secondary | ICD-10-CM

## 2020-07-05 DIAGNOSIS — R0602 Shortness of breath: Secondary | ICD-10-CM

## 2020-07-05 LAB — VITAMIN B12: Vitamin B-12: 275 pg/mL (ref 180–914)

## 2020-07-05 LAB — GLUCOSE, CAPILLARY
Glucose-Capillary: 124 mg/dL — ABNORMAL HIGH (ref 70–99)
Glucose-Capillary: 218 mg/dL — ABNORMAL HIGH (ref 70–99)

## 2020-07-05 LAB — STREP PNEUMONIAE URINARY ANTIGEN: Strep Pneumo Urinary Antigen: NEGATIVE

## 2020-07-05 MED ORDER — NICOTINE POLACRILEX 2 MG MT GUM
2.0000 mg | CHEWING_GUM | OROMUCOSAL | Status: DC | PRN
  Filled 2020-07-05: qty 1

## 2020-07-05 MED ORDER — PANTOPRAZOLE SODIUM 40 MG PO TBEC
40.0000 mg | DELAYED_RELEASE_TABLET | Freq: Every day | ORAL | Status: DC
  Administered 2020-07-05 – 2020-07-07 (×3): 40 mg via ORAL
  Filled 2020-07-05 (×3): qty 1

## 2020-07-05 MED ORDER — INSULIN ASPART 100 UNIT/ML ~~LOC~~ SOLN
0.0000 [IU] | Freq: Three times a day (TID) | SUBCUTANEOUS | Status: DC
  Administered 2020-07-05: 2 [IU] via SUBCUTANEOUS
  Administered 2020-07-05 – 2020-07-06 (×2): 5 [IU] via SUBCUTANEOUS
  Administered 2020-07-07: 2 [IU] via SUBCUTANEOUS

## 2020-07-05 MED ORDER — PREDNISONE 20 MG PO TABS
40.0000 mg | ORAL_TABLET | Freq: Every day | ORAL | Status: DC
  Administered 2020-07-05 – 2020-07-07 (×3): 40 mg via ORAL
  Filled 2020-07-05 (×3): qty 2

## 2020-07-05 NOTE — Evaluation (Signed)
Physical Therapy Evaluation Patient Details Name: Deanna Petty MRN: [id number] DOB: [birth year] Today's Date: 07/05/2020   History of Present Illness  74 yo female admitted with acute resp failure.  Clinical Impression  On eval, pt was Min guard assist for mobility. She walked ~150 feet without a device. O2 86% on RA at rest so reapplied Peak O2. With ambulation, O2 90% on 2L [place]. Pt participated well. Will plan to follow and progress activity as tolerated. Pt c/o some general weakness so will recommend HHPT f/u if she is agreeable to it.    Follow Up Recommendations Supervision - Intermittent;Home health PT    Equipment Recommendations  None recommended by PT    Recommendations for Other Services       Precautions / Restrictions Precautions Precautions: Fall Precaution Comments: monitor O2 Restrictions Weight Bearing Restrictions: No      Mobility  Bed Mobility               General bed mobility comments: oob in recliner  Transfers Overall transfer level: Needs assistance   Transfers: Sit to/from Stand Sit to Stand: Min guard         General transfer comment: Min guard for safety.  Ambulation/Gait Ambulation/Gait assistance: Min guard Gait Distance (Feet): 150 Feet Assistive device: None Gait Pattern/deviations: Step-through pattern;Decreased stride length     General Gait Details: Intermitttently unsteady. O2 90% on 2L .  Stairs            Wheelchair Mobility    Modified Rankin (Stroke Patients Only)       Balance Overall balance assessment: Needs assistance           Standing balance-Leahy Scale: Fair                               Pertinent Vitals/Pain Pain Assessment: No/denies pain    Home Living Family/patient expects to be discharged to:: Private residence Living Arrangements: Other relatives   Type of Home: House       Home Layout: One level Home Equipment: Environmental consultant - 2 wheels;Cane - single point       Prior Function Level of Independence: Independent               Hand Dominance        Extremity/Trunk Assessment   Upper Extremity Assessment Upper Extremity Assessment: Overall WFL for tasks assessed    Lower Extremity Assessment Lower Extremity Assessment: Generalized weakness    Cervical / Trunk Assessment Cervical / Trunk Assessment: Normal  Communication   Communication: No difficulties  Cognition Arousal/Alertness: Awake/alert Behavior During Therapy: WFL for tasks assessed/performed Overall Cognitive Status: Within Functional Limits for tasks assessed                                        General Comments      Exercises     Assessment/Plan    PT Assessment Patient needs continued PT services  PT Problem List Decreased strength;Decreased mobility;Decreased activity tolerance;Decreased balance       PT Treatment Interventions DME instruction;Gait training;Therapeutic activities;Therapeutic exercise;Patient/family education;Balance training;Functional mobility training    PT Goals (Current goals can be found in the Care Plan section)  Acute Rehab PT Goals Patient Stated Goal: home soon PT Goal Formulation: With patient Time For Goal Achievement: 07/19/20 Potential to Achieve Goals: Good  Frequency Min 3X/week   Barriers to discharge        Co-evaluation               AM-PAC PT "6 Clicks" Mobility  Outcome Measure Help needed turning from your back to your side while in a flat bed without using bedrails?: A Little Help needed moving from lying on your back to sitting on the side of a flat bed without using bedrails?: A Little Help needed moving to and from a bed to a chair (including a wheelchair)?: A Little Help needed standing up from a chair using your arms (e.g., wheelchair or bedside chair)?: A Little Help needed to walk in hospital room?: A Little Help needed climbing 3-5 steps with a railing? : A Little 6  Click Score: 18    End of Session Equipment Utilized During Treatment: Oxygen;Gait belt Activity Tolerance: Patient tolerated treatment well Patient left: in chair;with call bell/phone within reach   PT Visit Diagnosis: Unsteadiness on feet (R26.81);Muscle weakness (generalized) (M62.81)    Time: 8676-1950 PT Time Calculation (min) (ACUTE ONLY): 13 min   Charges:   PT Evaluation $PT Eval Low Complexity: [place], PT Acute Rehabilitation  Office: [phone] Pager: [phone]

## 2020-07-05 NOTE — Progress Notes (Addendum)
PROGRESS NOTE  Deanna Petty QQV:[id number] DOB: 12-31-1945 DOA: 07/03/2020 PCP: Pcp, No   LOS: 2 days   Brief narrative: As per HPI,  Deanna Petty is a 74 y.o. female with medical history significant for 60-pack-year history without formal history of COPD or asthma presented to hospital with shortness of breath for 3 days with exertion.  Patient was noted to be hypoxic at home saturating in the 60s and was brought in hospital for further evaluation and treatment.   In the ED, she was noted to be hypercapnic, hypoxic, with an elevated BNP.  She was placed on BiPAP and 4 L and her oxygen saturations were maintained at greater than 90%.  She remained mildly tachypneic.  Patient was then considered for admission to hospital.  Assessment/Plan:  Principal Problem:   Acute hypoxemic respiratory failure (HCC) Active Problems:   Tobacco use   Elevated brain natriuretic peptide (BNP) level  Acute hypoxic and hypercapnic respiratory failure, present on admission.  Patient today stated that she was on inhalers in the past and has ran out of it.  Likely she has chronic COPD with acute exacerbation this time. Patient has longstanding smoking history and has been smoking for the last 60 years. Chest x-ray showing mild interstitial edema/atelectasis or early infiltrate.  No evidence of pulmonary embolism on CT scan..  Continue albuterol Atrovent and Breo Ellipta.  Received 1 dose of IV Lasix.  Patient initially received BiPAP.  Currently off BiPAP on nasal cannula oxygen but on noctural bipap.   No leukocytosis.  BNP 248. 2D echocardiogram showed normal LV ejection fraction with diastolic dysfunction and right ventricular systolic function was preserved.   Pro-calcitonin was less than 0.1.  MRSA screen was negative.  HIV non ractive..  Initial ABG was PCO2 of 91 with pH of 7.2.  Repeat ABG done on 07/04/20 showed pH of 7.39 with PCO2 of 68 and PO2 of 78 with bicarb at 41.   Patient would likely benefit from  bipap at home to address her hypercapnia and hypoxia with polycythemia. Mild wheezing today. We will add p.o. prednisone. patient will benefit from outpatient sleep study. We will ambulate the patient in the hallway. Consult PT.   Diabetes mellitus type 2. New diagnosis. Hemoglobin A1c of 6.8.    Will consider diabetic diet and Metformin on discharge.  Add sliding scale while in hospital especially when on p.o. prednisone.  Will need follow-up with primary care physician as outpatient.  Elevated MCV anemia, mild thrombocytopenia.  TSH of 1.3 and within normal range.  Possibility of vitamin B-12 deficiency.  Check vitamin B12 level, methylmalonic acid.  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  Smoking cessation was extensively discussed with the patient.  Ongoing tobacco use Continue nicotine patch.  Encouraged cessation of smoking.  Extensive counseling was done. She is motivated to quit. Would benefit from nicotine patch and lozenge on discharge  DVT prophylaxis: enoxaparin (LOVENOX) injection 40 mg Start: 07/04/20 1000   Code Status: Full code  Family Communication: I tried to reach the patient's cousin Kennon Rounds today in the computer but  was unable to reach.  Status is: Inpatient  Remains inpatient appropriate because:IV treatments appropriate due to intensity of illness or inability to take PO and Inpatient level of care appropriate due to severity of illness   Dispo: The patient is from: Home              Anticipated d/c is to: Home  Anticipated d/c date is: 1-2 days.               Patient currently is not medically stable to d/c.  Consultants:  None  Procedures:  BiPAP placement  Antibiotics:  . Azithromycin PO  Subjective: Today, patient was seen and examined at bedside. Complains of mild cough . Denies overt dyspnea congestion sore throat fever or chills.  Objective: Body mass index is 33.28 kg/m.   Vitals:   07/05/20  0800 07/05/20 0805  BP:  (!) 134/52  Pulse:  73  Resp:  (!) 36  Temp: (!) 97.5 F (36.4 C)   SpO2:  97%    Intake/Output Summary (Last 24 hours) at 07/05/2020 1135 Last data filed at 07/05/2020 1100 Gross per 24 hour  Intake --  Output 600 ml  Net -600 ml   Filed Weights   07/03/20 1611  Weight: 90.7 kg   Body mass index is 33.28 kg/m.   Physical Exam: GENERAL: Patient is alert awake and oriented. Not in obvious distress.  Obese, on nasal cannula oxygen. HENT: No scleral pallor or icterus. Pupils equally reactive to light. Oral mucosa is moist NECK: is supple, no gross swelling noted. CHEST: Diminished breath sounds bilaterally. Occasional expiratory wheezes noted CVS: S1 and S2 heard, no murmur. Regular rate and rhythm.  ABDOMEN: Soft, non-tender, bowel sounds are present. EXTREMITIES: Trace edema CNS: Cranial nerves are intact. No focal motor deficits. SKIN: warm and dry without rashes.  Data Review: I have personally reviewed the following laboratory data and studies,  CBC: Recent Labs  Lab 07/03/20 1830 07/03/20 1849 07/04/20 0316 07/04/20 1115  WBC 7.8  --  6.4  --   NEUTROABS 6.9  --   --   --   HGB 15.2* 16.7* 15.3* 15.6*  HCT 49.4* 49.0* 49.5* 46.0  MCV 101.6*  --  101.6*  --   PLT 144*  --  130*  --    Basic Metabolic Panel: Recent Labs  Lab 07/03/20 1830 07/03/20 1849 07/04/20 0316 07/04/20 1115  NA 135 140 139 140  K 4.1 4.1 5.4* 4.5  CL 93* 93* 94*  --   CO2 35*  --  34*  --   GLUCOSE 186* 186* 171*  --   BUN 11 11 11   --   CREATININE 0.66 0.70 0.69  --   CALCIUM 8.3*  --  8.9  --    Liver Function Tests: Recent Labs  Lab 07/03/20 1830  AST 46*  ALT 60*  ALKPHOS 95  BILITOT 0.5  PROT 8.0  ALBUMIN 4.0   No results for input(s): LIPASE, AMYLASE in the last 168 hours. No results for input(s): AMMONIA in the last 168 hours. Cardiac Enzymes: No results for input(s): CKTOTAL, CKMB, CKMBINDEX, TROPONINI in the last 168 hours. BNP  (last 3 results) Recent Labs    07/03/20 1830 07/04/20 0316  BNP 247.4* 248.3*    ProBNP (last 3 results) No results for input(s): PROBNP in the last 8760 hours.  CBG: No results for input(s): GLUCAP in the last 168 hours. Recent Results (from the past 240 hour(s))  SARS Coronavirus 2 by RT PCR (hospital order, performed in [facility] hospital lab) Nasopharyngeal Nasopharyngeal Swab     Status: None   Collection Time: 07/03/20  6:12 PM   Specimen: Nasopharyngeal Swab  Result Value Ref Range Status   SARS Coronavirus 2 NEGATIVE NEGATIVE Final    Comment: (NOTE) SARS-CoV-2 target nucleic acids are NOT DETECTED.  The  SARS-CoV-2 RNA is generally detectable in upper and lower respiratory specimens during the acute phase of infection. The lowest concentration of SARS-CoV-2 viral copies this assay can detect is 250 copies / mL. A negative result does not preclude SARS-CoV-2 infection and should not be used as the sole basis for treatment or other patient management decisions.  A negative result may occur with improper specimen collection / handling, submission of specimen other than nasopharyngeal swab, presence of viral mutation(s) within the areas targeted by this assay, and inadequate number of viral copies (<250 copies / mL). A negative result must be combined with clinical observations, patient history, and epidemiological information.  Fact Sheet for Patients:   StrictlyIdeas.no  Fact Sheet for Healthcare Providers: BankingDealers.co.za  This test is not yet approved or  cleared by the [place] FDA and has been authorized for detection and/or diagnosis of SARS-CoV-2 by FDA under an Emergency Use Authorization (EUA).  This EUA will remain in effect (meaning this test can be used) for the duration of the COVID-19 declaration under Section 564(b)(1) of the Act, 21 U.S.C. section 360bbb-3(b)(1), unless the authorization is  terminated or revoked sooner.  Performed at [facility], [place] [address] [place], [place] [address]   MRSA PCR Screening     Status: None   Collection Time: 07/03/20 11:57 PM   Specimen: Nasal Mucosa; Nasopharyngeal  Result Value Ref Range Status   MRSA by PCR NEGATIVE NEGATIVE Final    Comment:        The GeneXpert MRSA Assay (FDA approved for NASAL specimens only), is one component of a comprehensive MRSA colonization surveillance program. It is not intended to diagnose MRSA infection nor to guide or monitor treatment for MRSA infections. Performed at [facility], [place] [address] [place], [place] [address]   Respiratory Panel by PCR     Status: None   Collection Time: 07/04/20 12:01 PM   Specimen: Nasopharyngeal Swab; Respiratory  Result Value Ref Range Status   Adenovirus NOT DETECTED NOT DETECTED Final   Coronavirus 229E NOT DETECTED NOT DETECTED Final    Comment: (NOTE) The Coronavirus on the Respiratory Panel, DOES NOT test for the novel  Coronavirus (2019 nCoV)    Coronavirus HKU1 NOT DETECTED NOT DETECTED Final   Coronavirus NL63 NOT DETECTED NOT DETECTED Final   Coronavirus OC43 NOT DETECTED NOT DETECTED Final   Metapneumovirus NOT DETECTED NOT DETECTED Final   Rhinovirus / Enterovirus NOT DETECTED NOT DETECTED Final   Influenza A NOT DETECTED NOT DETECTED Final   Influenza B NOT DETECTED NOT DETECTED Final   Parainfluenza Virus 1 NOT DETECTED NOT DETECTED Final   Parainfluenza Virus 2 NOT DETECTED NOT DETECTED Final   Parainfluenza Virus 3 NOT DETECTED NOT DETECTED Final   Parainfluenza Virus 4 NOT DETECTED NOT DETECTED Final   Respiratory Syncytial Virus NOT DETECTED NOT DETECTED Final   Bordetella pertussis NOT DETECTED NOT DETECTED Final   Chlamydophila pneumoniae NOT DETECTED NOT DETECTED Final   Mycoplasma pneumoniae NOT DETECTED NOT DETECTED Final    Comment: Performed at [facility], [place]. [address],  [place], [place] [address]     Studies: CT Angio Chest PE W and/or Wo Contrast  Result Date: 07/03/2020 CLINICAL DATA:  Shortness of breath x3 days. EXAM: CT ANGIOGRAPHY CHEST WITH CONTRAST TECHNIQUE: Multidetector CT imaging of the chest was performed using the standard protocol during bolus administration of intravenous contrast. Multiplanar CT image reconstructions and MIPs were obtained to evaluate the  vascular anatomy. CONTRAST:  185mL OMNIPAQUE IOHEXOL 350 MG/ML SOLN COMPARISON:  None. FINDINGS: Cardiovascular: Moderate to marked severity calcification of the aortic arch is seen. Satisfactory opacification of the pulmonary arteries to the segmental level. No evidence of pulmonary embolism. Normal heart size with moderate severity coronary artery calcification. No pericardial effusion. Mediastinum/Nodes: No enlarged mediastinal, hilar, or axillary lymph nodes. Thyroid gland, trachea, and esophagus demonstrate no significant findings. Lungs/Pleura: Very mild patchy posteromedial right lower lobe and posteromedial left lower lobe atelectasis and/or early infiltrate is seen. There is no evidence of a pleural effusion or pneumothorax. Upper Abdomen: Subcentimeter gallstones are seen within the gallbladder lumen. Musculoskeletal: A chronic versus congenital deformity is seen along the distal aspect of the right clavicle. Degenerative changes are seen throughout the thoracic spine. Review of the MIP images confirms the above findings. IMPRESSION: 1. No evidence of pulmonary embolism. 2. Very mild patchy posteromedial right lower lobe and posteromedial left lower lobe atelectasis and/or early infiltrate. 3. Cholelithiasis. 4. Chronic versus congenital deformity along the distal aspect of the right clavicle. 5. Aortic atherosclerosis. Aortic Atherosclerosis (ICD10-I70.0). Electronically Signed   By: Virgina Norfolk M.D.   On: 07/03/2020 19:39   DG Chest Port 1 View  Result Date: 07/03/2020 CLINICAL DATA:   Shortness of breath x-ray days, current smoker EXAM: PORTABLE CHEST 1 VIEW. Bilateral costophrenic angles collimated off the COMPARISON:  Chest x-ray 06/03/2013 FINDINGS: The heart size and mediastinal contours are unchanged. Aortic arch calcifications. Bibasilar increased interstitial markings. Hazy airspace opacity within bilateral lower lobes. Limited evaluation for pleural effusion with costophrenic angles collimated off view. No pneumothorax. No acute osseous abnormality. IMPRESSION: 1. Increased interstitial markings and hazy airspace opacities within bilateral lower lung zones suggestive of atypical/viral pneumonia. Differential diagnosis includes pulmonary edema. 2. No definite pleural effusion; however, bilateral costophrenic angles collimated off view and overlying soft tissues. Electronically Signed   By: Iven Finn M.D.   On: 07/03/2020 18:27   ECHOCARDIOGRAM COMPLETE  Result Date: 07/04/2020    ECHOCARDIOGRAM REPORT   Patient Name:   ANAGHA LOSEKE Date of Exam: 07/04/2020 Medical Rec #:  [id number]     Height:       65.0 in Accession #:    [id number]    Weight:       200.0 lb Date of Birth:  [birth year]    BSA:          1.978 m Patient Age:    61 years      BP:           155/99 mmHg Patient Gender: F             HR:           70 bpm. Exam Location:  Inpatient Procedure: 2D Echo, Cardiac Doppler and Color Doppler Indications:    Acute Respiratory Insufficiency 518.82 / R06.89  History:        Patient has no prior history of Echocardiogram examinations.                 Signs/Symptoms:Dyspnea; Risk Factors:Current Smoker.  Sonographer:    Jonelle Sidle Dance Referring Phys: [id number] Dakins PRATT  Sonographer Comments: No subcostal window. IMPRESSIONS  1. Left ventricular ejection fraction, by estimation, is 60 to 65%. The left ventricle has normal function. Left ventricular endocardial border not optimally defined to evaluate regional wall motion. There is mild left ventricular hypertrophy. Left ventricular  diastolic parameters are consistent with Grade I diastolic dysfunction (impaired relaxation). Elevated left atrial  pressure.  2. Right ventricular systolic function is normal. The right ventricular size is normal.  3. The mitral valve is normal in structure. No evidence of mitral valve regurgitation. No evidence of mitral stenosis.  4. The aortic valve is tricuspid. Aortic valve regurgitation is not visualized. No aortic stenosis is present. FINDINGS  Left Ventricle: Possible apical hypokinesis, difficult visualization. Left ventricular ejection fraction, by estimation, is 60 to 65%. The left ventricle has normal function. Left ventricular endocardial border not optimally defined to evaluate regional  wall motion. The left ventricular internal cavity size was normal in size. There is mild left ventricular hypertrophy. Left ventricular diastolic parameters are consistent with Grade I diastolic dysfunction (impaired relaxation). Elevated left atrial pressure. Right Ventricle: The right ventricular size is normal. No increase in right ventricular wall thickness. Right ventricular systolic function is normal. Left Atrium: Left atrial size was normal in size. Right Atrium: Right atrial size was normal in size. Pericardium: There is no evidence of pericardial effusion. Mitral Valve: The mitral valve is normal in structure. No evidence of mitral valve regurgitation. No evidence of mitral valve stenosis. Tricuspid Valve: The tricuspid valve is normal in structure. Tricuspid valve regurgitation is not demonstrated. No evidence of tricuspid stenosis. Aortic Valve: The aortic valve is tricuspid. Aortic valve regurgitation is not visualized. No aortic stenosis is present. Pulmonic Valve: The pulmonic valve was not well visualized. Pulmonic valve regurgitation is not visualized. No evidence of pulmonic stenosis. Aorta: The aortic root is normal in size and structure. Pulmonary Artery: Indeterminant PASP, inadequate TR jet.  IAS/Shunts: The interatrial septum was not well visualized.  LEFT VENTRICLE PLAX 2D LVIDd:         3.90 cm  Diastology LVIDs:         2.70 cm  LV e' medial:    5.11 cm/s LV PW:         1.20 cm  LV E/e' medial:  23.9 LV IVS:        1.20 cm  LV e' lateral:   5.22 cm/s LVOT diam:     1.80 cm  LV E/e' lateral: 23.4 LVOT Area:     2.54 cm  RIGHT VENTRICLE RV Basal diam:  1.80 cm RV S prime:     9.57 cm/s TAPSE (M-mode): 1.6 cm LEFT ATRIUM             Index       RIGHT ATRIUM           Index LA diam:        4.00 cm 2.02 cm/m  RA Area:     12.50 cm LA Vol (A2C):   29.5 ml 14.91 ml/m RA Volume:   29.40 ml  14.86 ml/m LA Vol (A4C):   14.1 ml 7.13 ml/m LA Biplane Vol: 20.8 ml 10.51 ml/m   AORTA Ao Root diam: 3.00 cm Ao Asc diam:  3.20 cm MITRAL VALVE MV Area (PHT): 3.08 cm     SHUNTS MV Decel Time: 246 msec     Systemic Diam: 1.80 cm MV E velocity: 122.00 cm/s MV A velocity: 133.00 cm/s MV E/A ratio:  0.92 Carlyle Dolly MD Electronically signed by Carlyle Dolly MD Signature Date/Time: 07/04/2020/10:46:26 AM    Final       Flora Lipps, MD  [facility] 07/05/2020

## 2020-07-06 DIAGNOSIS — J9601 Acute respiratory failure with hypoxia: Secondary | ICD-10-CM

## 2020-07-06 LAB — CBC
HCT: 48 % — ABNORMAL HIGH (ref 36.0–46.0)
Hemoglobin: 15.5 g/dL — ABNORMAL HIGH (ref 12.0–15.0)
MCH: 31.6 pg (ref 26.0–34.0)
MCHC: 32.3 g/dL (ref 30.0–36.0)
MCV: 97.8 fL (ref 80.0–100.0)
Platelets: 172 10*3/uL (ref 150–400)
RBC: 4.91 MIL/uL (ref 3.87–5.11)
RDW: 14.1 % (ref 11.5–15.5)
WBC: 8.5 10*3/uL (ref 4.0–10.5)
nRBC: 0 % (ref 0.0–0.2)

## 2020-07-06 LAB — BASIC METABOLIC PANEL
Anion gap: 9 (ref 5–15)
BUN: 18 mg/dL (ref 8–23)
CO2: 36 mmol/L — ABNORMAL HIGH (ref 22–32)
Calcium: 9.1 mg/dL (ref 8.9–10.3)
Chloride: 95 mmol/L — ABNORMAL LOW (ref 98–111)
Creatinine, Ser: 0.69 mg/dL (ref 0.44–1.00)
GFR calc Af Amer: >60 mL/min (ref >60)
GFR calc non Af Amer: >60 mL/min (ref >60)
Glucose, Bld: 113 mg/dL — ABNORMAL HIGH (ref 70–99)
Potassium: 3.9 mmol/L (ref 3.5–5.1)
Sodium: 140 mmol/L (ref 135–145)

## 2020-07-06 LAB — LIPID PANEL
Cholesterol: 149 mg/dL (ref 0–200)
HDL: 50 mg/dL (ref >40)
LDL Cholesterol: 83 mg/dL (ref 0–99)
Total CHOL/HDL Ratio: 3 RATIO
Triglycerides: 80 mg/dL (ref <150)
VLDL: 16 mg/dL (ref 0–40)

## 2020-07-06 LAB — GLUCOSE, CAPILLARY
Glucose-Capillary: 103 mg/dL — ABNORMAL HIGH (ref 70–99)
Glucose-Capillary: 159 mg/dL — ABNORMAL HIGH (ref 70–99)
Glucose-Capillary: 247 mg/dL — ABNORMAL HIGH (ref 70–99)
Glucose-Capillary: 168 mg/dL — ABNORMAL HIGH (ref 70–99)

## 2020-07-06 LAB — BLOOD GAS, ARTERIAL
Acid-Base Excess: 9.7 mmol/L — ABNORMAL HIGH (ref 0.0–2.0)
Bicarbonate: 35 mmol/L — ABNORMAL HIGH (ref 20.0–28.0)
Delivery systems: POSITIVE
Drawn by: 422461
O2 Content: 2 L/min
O2 Saturation: 89.9 %
Patient temperature: 97.7
pCO2 arterial: 48.6 mmHg — ABNORMAL HIGH (ref 32.0–48.0)
pH, Arterial: 7.469 — ABNORMAL HIGH (ref 7.350–7.450)
pO2, Arterial: 58.4 mmHg — ABNORMAL LOW (ref 83.0–108.0)

## 2020-07-06 LAB — LEGIONELLA PNEUMOPHILA SEROGP 1 UR AG: L. pneumophila Serogp 1 Ur Ag: NEGATIVE

## 2020-07-06 MED ORDER — IPRATROPIUM BROMIDE HFA 17 MCG/ACT IN AERS
2.0000 | INHALATION_SPRAY | Freq: Two times a day (BID) | RESPIRATORY_TRACT | Status: DC
  Administered 2020-07-06 – 2020-07-07 (×2): 2 via RESPIRATORY_TRACT
  Filled 2020-07-06: qty 12.9

## 2020-07-06 NOTE — Consult Note (Signed)
NAME:  Deanna Petty, MRN:  [id number], DOB:  11-May-1946, LOS: 3 ADMISSION DATE:  07/03/2020, CONSULTATION DATE:  07/06/2020 REFERRING MD:  Jabier Mutton MD, CHIEF COMPLAINT:  COPD exacerbation, hypercarbic respiratory failure  Brief History    74 year old heavy smoker admitted with dyspnea that had been progressive over several weeks.  Hypoxic at home in the 60s.  Admitted for hypoxic, hypercarbic respiratory failure treated with BiPAP, bronchodilators, Lasix x1.  PCCM consulted for recommendations for home BiPAP  Past Medical History    has a past medical history of Bronchitis, Cigarette smoker, Pneumonia, and Shortness of breath.  She has a 100-pack-year history and continues to smoke.  Told she had COPD but no formal PFTs.  She is on inhalers but she ran out of it 3 years ago and never refilled.  She does not recall the names of these inhalers Denies snoring or daytime sleepiness. BMI 33  Significant Hospital Events   9/17-admit  Consults:  PCCM  Procedures:    Significant Diagnostic Tests:  Chest x-ray 9/17-hazy interstitial lower lung opacities.  CTA 9/17-no PE, minimal lower lobe atelectasis.  I have reviewed the images personally.  Echo 07/04/20 LVEF 60-65%, mild LVH grade 1 diastolic dysfunction.  RV systolic function is normal  Micro Data:  SARS-CoV-2 07/03/2020-negative Respiratory virus panel 07/04/2020-negative  Antimicrobials:  Azithromycin 9/18 >>  Interim history/subjective:    Objective   Blood pressure (!) 140/49, pulse 68, temperature 98.4 F (36.9 C), temperature source Oral, resp. rate 18, height 5\' 5"  (1.651 m), weight 90.7 kg, SpO2 95 %.        Intake/Output Summary (Last 24 hours) at 07/06/2020 1559 Last data filed at 07/06/2020 1400 Gross per 24 hour  Intake 1080 ml  Output 0 ml  Net 1080 ml   Filed Weights   07/03/20 1611  Weight: 90.7 kg    Examination: Blood pressure (!) 140/49, pulse 68, temperature 98.4 F (36.9 C), temperature  source Oral, resp. rate 18, height 5\' 5"  (1.651 m), weight 90.7 kg, SpO2 95 %. Gen:      No acute distress HEENT:  EOMI, sclera anicteric Neck:     No masses; no thyromegaly Lungs:    Clear to auscultation bilaterally; normal respiratory effort CV:         Regular rate and rhythm; no murmurs Abd:      + bowel sounds; soft, non-tender; no palpable masses, no distension Ext:    No edema; adequate peripheral perfusion Skin:      Warm and dry; no rash Neuro: alert and oriented x 3 Psych: normal mood and affect  Resolved Hospital Problem list        Acute hypoxic, hypercarbic respiratory failure Likely has baseline COPD with acute exacerbation Not on inhalers at home She may need home NIV but would first optimize her COPD and treat her adequately before reassessment Current guidelines do not recommend initiation of NIV during acute exacerbation unless there are recurrent admissions.  Change inhalers to LABA/LAMA dual bronchodilator with Anoro.  I do not think she will require inhaled corticosteroids as there is no peripheral eosinophilia.  Will need repeat bicarb or ABG as an outpatient after improvement of her exacerbation Outpatient sleep study for evaluation of OSA.  Active smoker Smoking cessation encouraged Nicotine patches  We will make arrangement for follow up in clinic Please call with questions  Best practice:  Per primary team  Labs   CBC: Recent Labs  Lab 07/03/20 1830 07/03/20 1849 07/04/20 0316  07/04/20 1115 07/06/20 0513  WBC 7.8  --  6.4  --  8.5  NEUTROABS 6.9  --   --   --   --   HGB 15.2* 16.7* 15.3* 15.6* 15.5*  HCT 49.4* 49.0* 49.5* 46.0 48.0*  MCV 101.6*  --  101.6*  --  97.8  PLT 144*  --  130*  --  182    Basic Metabolic Panel: Recent Labs  Lab 07/03/20 1830 07/03/20 1849 07/04/20 0316 07/04/20 1115 07/06/20 0513  NA 135 140 139 140 140  K 4.1 4.1 5.4* 4.5 3.9  CL 93* 93* 94*  --  95*  CO2 35*  --  34*  --  36*  GLUCOSE 186* 186*  171*  --  113*  BUN 11 11 11   --  18  CREATININE 0.66 0.70 0.69  --  0.69  CALCIUM 8.3*  --  8.9  --  9.1   GFR: Estimated Creatinine Clearance: 70.7 mL/min (by C-G formula based on SCr of 0.69 mg/dL). Recent Labs  Lab 07/03/20 1830 07/04/20 0316 07/06/20 0513  PROCALCITON  --  <0.10  --   WBC 7.8 6.4 8.5    Liver Function Tests: Recent Labs  Lab 07/03/20 1830  AST 46*  ALT 60*  ALKPHOS 95  BILITOT 0.5  PROT 8.0  ALBUMIN 4.0   No results for input(s): LIPASE, AMYLASE in the last 168 hours. No results for input(s): AMMONIA in the last 168 hours.  ABG    Component Value Date/Time   PHART 7.469 (H) 07/06/2020 0500   PCO2ART 48.6 (H) 07/06/2020 0500   PO2ART 58.4 (L) 07/06/2020 0500   HCO3 35.0 (H) 07/06/2020 0500   TCO2 44 (H) 07/04/2020 1115   O2SAT 89.9 07/06/2020 0500     Coagulation Profile: No results for input(s): INR, PROTIME in the last 168 hours.  Cardiac Enzymes: No results for input(s): CKTOTAL, CKMB, CKMBINDEX, TROPONINI in the last 168 hours.  HbA1C: Hgb A1c MFr Bld  Date/Time Value Ref Range Status  07/04/2020 03:16 AM 6.8 (H) 4.8 - 5.6 % Final    Comment:    (NOTE) Pre diabetes:          5.7%-6.4%  Diabetes:              >6.4%  Glycemic control for   <7.0% adults with diabetes     CBG: Recent Labs  Lab 07/05/20 1224 07/05/20 1601 07/06/20 0729 07/06/20 1208  GLUCAP 124* 218* 103* 159*    Review of Systems:    REVIEW OF SYSTEMS:   All negative; except for those that are bolded, which indicate positives.  Constitutional: weight loss, weight gain, night sweats, fevers, chills, fatigue, weakness.  HEENT: headaches, sore throat, sneezing, nasal congestion, post nasal drip, difficulty swallowing, tooth/dental problems, visual complaints, visual changes, ear aches. Neuro: difficulty with speech, weakness, numbness, ataxia. CV:  chest pain, orthopnea, PND, swelling in lower extremities, dizziness, palpitations, syncope.  Resp:  cough, hemoptysis, dyspnea, wheezing. GI: heartburn, indigestion, abdominal pain, nausea, vomiting, diarrhea, constipation, change in bowel habits, loss of appetite, hematemesis, melena, hematochezia.  GU: dysuria, change in color of urine, urgency or frequency, flank pain, hematuria. MSK: joint pain or swelling, decreased range of motion. Psych: change in mood or affect, depression, anxiety, suicidal ideations, homicidal ideations. Skin: rash, itching, bruising.  Past Medical History  She,  has a past medical history of Bronchitis, Cigarette smoker, Pneumonia, and Shortness of breath.   Surgical History    Past Surgical  History:  Procedure Laterality Date  . ABDOMINAL HYSTERECTOMY    . BREAST LUMPECTOMY WITH NEEDLE LOCALIZATION Left 06/12/2013   Procedure: LEFT BREAST LUMPECTOMY WITH NEEDLE LOCALIZATION;  Surgeon: Imogene Burn. Georgette Dover, MD;  Location: [facility];  Service: General;  Laterality: Left;  . BREAST SURGERY       Social History   reports that she has been smoking cigarettes. She has a 10.00 pack-year smoking history. She has never used smokeless tobacco. She reports current alcohol use. She reports that she does not use drugs.   Family History   Her family history includes Cancer in her mother; Diabetes in her father.   Allergies No Known Allergies   Home Medications  Prior to Admission medications   Medication Sig Start Date End Date Taking? Authorizing Provider  albuterol (PROVENTIL HFA;VENTOLIN HFA) 108 (90 BASE) MCG/ACT inhaler Inhale 2 puffs into the lungs every 6 (six) hours as needed for wheezing or shortness of breath.   Yes [provider]  cholecalciferol (VITAMIN D3) 25 MCG (1000 UNIT) tablet Take 1,000 Units by mouth daily.   Yes [provider]  ibuprofen (ADVIL,MOTRIN) 200 MG tablet Take 400 mg by mouth every 6 (six) hours as needed for pain.   Yes [provider]  Omega-3 1000 MG CAPS Take 1 capsule by mouth daily.   Yes [provider]     Critical care time: NA   Marshell Garfinkel MD [facility] Please see [email or web address] for pager details.  07/06/2020, 4:19 PM

## 2020-07-06 NOTE — Progress Notes (Signed)
PHYSICAL THERAPY  SATURATION QUALIFICATIONS: (This note is used to comply with regulatory documentation for home oxygen)  Patient Saturations on Room Air at Rest = 90%  Patient Saturations on Room Air while Ambulating 55 feet = 88% HR 94   Please briefly explain why patient needs home oxygen:  Pt does not qualify for supplemental oxygen boarder line sats.    Rica Koyanagi  PTA Acute  Rehabilitation Services Pager      [phone] Office      [phone]

## 2020-07-06 NOTE — Progress Notes (Signed)
Physical Therapy Treatment Patient Details Name: Deanna Petty MRN: [id number] DOB: Oct 24, 1945 Today's Date: 07/06/2020    History of Present Illness 74 yo female admitted with acute resp failure.    PT Comments    Assisted OOB to amb a greater distance in hallway while monitoring RA. General Gait Details: tolerated an increased distance with avg RA above 88% SATURATION QUALIFICATIONS: (This note is used to comply with regulatory documentation for home oxygen)  Patient Saturations on Room Air at Rest = 90%  Patient Saturations on Room Air while Ambulating 55 feet = 88% HR 94   Please briefly explain why patient needs home oxygen:  Pt does not qualify for supplemental oxygen boarder line sats.     Follow Up Recommendations  Supervision - Intermittent;Home health PT     Equipment Recommendations  None recommended by PT    Recommendations for Other Services       Precautions / Restrictions Precautions Precautions: Fall Precaution Comments: monitor O2    Mobility  Bed Mobility Overal bed mobility: Needs Assistance Bed Mobility: Supine to Sit;Sit to Supine     Supine to sit: Supervision Sit to supine: Supervision;Min guard   General bed mobility comments: self able with increased time  Transfers Overall transfer level: Needs assistance Equipment used: Rolling walker (2 wheeled) Transfers: Sit to/from [facility] Sit to Stand: Supervision Stand pivot transfers: Supervision;Min guard       General transfer comment: good safety cognition and use of hands to steady self  Ambulation/Gait Ambulation/Gait assistance: Supervision Gait Distance (Feet): 245 Feet (one seated rest break) Assistive device: None Gait Pattern/deviations: Step-through pattern;Decreased stride length Gait velocity: decreased   General Gait Details: tolerated an increased distance with avg RA above 88%   Stairs             Wheelchair Mobility    Modified  Rankin (Stroke Patients Only)       Balance                                            Cognition Arousal/Alertness: Awake/alert Behavior During Therapy: WFL for tasks assessed/performed Overall Cognitive Status: Within Functional Limits for tasks assessed                                 General Comments: AxO x 3 very pleasant.  Shared she lives with her cousin and that she smokes about 2 packs a week      Exercises      General Comments        Pertinent Vitals/Pain      Home Living                      Prior Function            PT Goals (current goals can now be found in the care plan section) Progress towards PT goals: Progressing toward goals    Frequency    Min 3X/week      PT Plan Current plan remains appropriate    Co-evaluation              AM-PAC PT "6 Clicks" Mobility   Outcome Measure  Help needed turning from your back to your side while in a flat bed without using bedrails?: A Little Help needed  moving from lying on your back to sitting on the side of a flat bed without using bedrails?: A Little Help needed moving to and from a bed to a chair (including a wheelchair)?: A Little Help needed standing up from a chair using your arms (e.g., wheelchair or bedside chair)?: A Little Help needed to walk in hospital room?: A Little Help needed climbing 3-5 steps with a railing? : A Little 6 Click Score: 18    End of Session Equipment Utilized During Treatment: Oxygen;Gait belt Activity Tolerance: Patient tolerated treatment well Patient left: with call bell/phone within reach;in bed Nurse Communication: Mobility status PT Visit Diagnosis: Unsteadiness on feet (R26.81);Muscle weakness (generalized) (M62.81)     Time: 5670-1410 PT Time Calculation (min) (ACUTE ONLY): 35 min  Charges:  $Gait Training: 8-22 mins $Therapeutic Activity: 8-22 mins                     Rica Koyanagi  PTA Acute   Rehabilitation Services Pager      [phone] Office      [phone]

## 2020-07-06 NOTE — Progress Notes (Signed)
Morning arterial blood gas drawn while Pt was on her night time BiPAP with settings charted.

## 2020-07-06 NOTE — Progress Notes (Addendum)
PROGRESS NOTE    LAKEYA MULKA  PXT:[id number]  DOB: 06-28-1946  PCP: Pcp, No Admit date:07/03/2020 Chief compliant: Dyspnea on exertion Hospital course: 74 y.o.femalewith medical history significant for60-pack-year history without formal history of COPD or asthma presented to hospital with shortness of breath for 3 days with exertion.  Patient was noted to be hypoxic at home saturating in the 60s and was brought in hospital for further evaluation and treatment.  In the ED, she was noted to be hypercapnic, hypoxic, with an elevated BNP. She was placed on BiPAP with 4 L 02 and her oxygen saturations were maintained at greater than 90%. She remained mildly tachypneic.  Patient was then considered for admission to the hospital.  Subjective:  Patient sitting comfortably in the bed.  Noted to be on 4 L O2.  Was to BiPAP overnight and ABG shows improvement.  Objective: Vitals:   07/05/20 2356 07/06/20 0457 07/06/20 0826 07/06/20 1211  BP: (!) 151/62 (!) 148/92  (!) 140/49  Pulse: 72 67  68  Resp: 20 20  18   Temp: 97.7 F (36.5 C) 97.7 F (36.5 C)  98.4 F (36.9 C)  TempSrc: Oral Oral  Oral  SpO2: 94% 92% 92% 95%  Weight:      Height:        Intake/Output Summary (Last 24 hours) at 07/06/2020 1508 Last data filed at 07/06/2020 1000 Gross per 24 hour  Intake 840 ml  Output 0 ml  Net 840 ml   Filed Weights   07/03/20 1611  Weight: 90.7 kg    Physical Examination:  General: Moderately built, no acute distress noted Head ENT: Atraumatic normocephalic, PERRLA, neck supple Heart: S1-S2 heard, regular rate and rhythm, no murmurs.  No leg edema noted Lungs: Equal air entry bilaterally, no rhonchi or rales on exam, no accessory muscle use Abdomen: Bowel sounds heard, soft, nontender, nondistended. No organomegaly.  No CVA tenderness Extremities: No pedal edema.  No cyanosis or clubbing. Neurological: Awake alert oriented x3, no focal weakness or numbness, strength and  sensations to crude touch intact Skin: No wounds or rashes.  Data Reviewed: I have personally reviewed following labs and imaging studies  CBC: Recent Labs  Lab 07/03/20 1830 07/03/20 1849 07/04/20 0316 07/04/20 1115 07/06/20 0513  WBC 7.8  --  6.4  --  8.5  NEUTROABS 6.9  --   --   --   --   HGB 15.2* 16.7* 15.3* 15.6* 15.5*  HCT 49.4* 49.0* 49.5* 46.0 48.0*  MCV 101.6*  --  101.6*  --  97.8  PLT 144*  --  130*  --  627   Basic Metabolic Panel: Recent Labs  Lab 07/03/20 1830 07/03/20 1849 07/04/20 0316 07/04/20 1115 07/06/20 0513  NA 135 140 139 140 140  K 4.1 4.1 5.4* 4.5 3.9  CL 93* 93* 94*  --  95*  CO2 35*  --  34*  --  36*  GLUCOSE 186* 186* 171*  --  113*  BUN 11 11 11   --  18  CREATININE 0.66 0.70 0.69  --  0.69  CALCIUM 8.3*  --  8.9  --  9.1   GFR: Estimated Creatinine Clearance: 70.7 mL/min (by C-G formula based on SCr of 0.69 mg/dL). Liver Function Tests: Recent Labs  Lab 07/03/20 1830  AST 46*  ALT 60*  ALKPHOS 95  BILITOT 0.5  PROT 8.0  ALBUMIN 4.0   No results for input(s): LIPASE, AMYLASE in the last 168 hours. No  results for input(s): AMMONIA in the last 168 hours. Coagulation Profile: No results for input(s): INR, PROTIME in the last 168 hours. Cardiac Enzymes: No results for input(s): CKTOTAL, CKMB, CKMBINDEX, TROPONINI in the last 168 hours. BNP (last 3 results) No results for input(s): PROBNP in the last 8760 hours. HbA1C: Recent Labs    07/04/20 0316  HGBA1C 6.8*   CBG: Recent Labs  Lab 07/05/20 1224 07/05/20 1601 07/06/20 0729 07/06/20 1208  GLUCAP 124* 218* 103* 159*   Lipid Profile: Recent Labs    07/06/20 0513  CHOL 149  HDL 50  LDLCALC 83  TRIG 80  CHOLHDL 3.0   Thyroid Function Tests: Recent Labs    07/04/20 0316  TSH 1.366   Anemia Panel: Recent Labs    07/05/20 1129  VITAMINB12 275   Sepsis Labs: Recent Labs  Lab 07/04/20 0316  PROCALCITON <0.10    Recent Results (from the past 240  hour(s))  SARS Coronavirus 2 by RT PCR (hospital order, performed in [facility] hospital lab) Nasopharyngeal Nasopharyngeal Swab     Status: None   Collection Time: 07/03/20  6:12 PM   Specimen: Nasopharyngeal Swab  Result Value Ref Range Status   SARS Coronavirus 2 NEGATIVE NEGATIVE Final    Comment: (NOTE) SARS-CoV-2 target nucleic acids are NOT DETECTED.  The SARS-CoV-2 RNA is generally detectable in upper and lower respiratory specimens during the acute phase of infection. The lowest concentration of SARS-CoV-2 viral copies this assay can detect is 250 copies / mL. A negative result does not preclude SARS-CoV-2 infection and should not be used as the sole basis for treatment or other patient management decisions.  A negative result may occur with improper specimen collection / handling, submission of specimen other than nasopharyngeal swab, presence of viral mutation(s) within the areas targeted by this assay, and inadequate number of viral copies (<250 copies / mL). A negative result must be combined with clinical observations, patient history, and epidemiological information.  Fact Sheet for Patients:   StrictlyIdeas.no  Fact Sheet for Healthcare Providers: BankingDealers.co.za  This test is not yet approved or  cleared by the [place] FDA and has been authorized for detection and/or diagnosis of SARS-CoV-2 by FDA under an Emergency Use Authorization (EUA).  This EUA will remain in effect (meaning this test can be used) for the duration of the COVID-19 declaration under Section 564(b)(1) of the Act, 21 U.S.C. section 360bbb-3(b)(1), unless the authorization is terminated or revoked sooner.  Performed at [facility], [place] [address] [place], [place] [address]   MRSA PCR Screening     Status: None   Collection Time: 07/03/20 11:57 PM   Specimen: Nasal Mucosa; Nasopharyngeal  Result Value Ref Range  Status   MRSA by PCR NEGATIVE NEGATIVE Final    Comment:        The GeneXpert MRSA Assay (FDA approved for NASAL specimens only), is one component of a comprehensive MRSA colonization surveillance program. It is not intended to diagnose MRSA infection nor to guide or monitor treatment for MRSA infections. Performed at [facility], [place] [address] [place], [place] [address]   Respiratory Panel by PCR     Status: None   Collection Time: 07/04/20 12:01 PM   Specimen: Nasopharyngeal Swab; Respiratory  Result Value Ref Range Status   Adenovirus NOT DETECTED NOT DETECTED Final   Coronavirus 229E NOT DETECTED NOT DETECTED Final    Comment: (NOTE) The Coronavirus on the Respiratory Panel, DOES NOT test  for the novel  Coronavirus (2019 nCoV)    Coronavirus HKU1 NOT DETECTED NOT DETECTED Final   Coronavirus NL63 NOT DETECTED NOT DETECTED Final   Coronavirus OC43 NOT DETECTED NOT DETECTED Final   Metapneumovirus NOT DETECTED NOT DETECTED Final   Rhinovirus / Enterovirus NOT DETECTED NOT DETECTED Final   Influenza A NOT DETECTED NOT DETECTED Final   Influenza B NOT DETECTED NOT DETECTED Final   Parainfluenza Virus 1 NOT DETECTED NOT DETECTED Final   Parainfluenza Virus 2 NOT DETECTED NOT DETECTED Final   Parainfluenza Virus 3 NOT DETECTED NOT DETECTED Final   Parainfluenza Virus 4 NOT DETECTED NOT DETECTED Final   Respiratory Syncytial Virus NOT DETECTED NOT DETECTED Final   Bordetella pertussis NOT DETECTED NOT DETECTED Final   Chlamydophila pneumoniae NOT DETECTED NOT DETECTED Final   Mycoplasma pneumoniae NOT DETECTED NOT DETECTED Final    Comment: Performed at [facility], [place] [address], [place], [place] [address]      Radiology Studies: No results found.    Scheduled Meds: . azithromycin  500 mg Oral Daily  . enoxaparin (LOVENOX) injection  40 mg Subcutaneous Q24H  . fluticasone furoate-vilanterol  1 puff Inhalation Daily  . insulin aspart   0-15 Units Subcutaneous TID WC  . ipratropium  2 puff Inhalation BID  . mouth rinse  15 mL Mouth Rinse q12n4p  . nicotine  14 mg Transdermal Daily  . pantoprazole  40 mg Oral Daily  . pneumococcal 23 valent vaccine  0.5 mL Intramuscular Tomorrow-1000  . predniSONE  40 mg Oral Q breakfast   Continuous Infusions:    Assessment/Plan: Acute hypoxic and hypercapnic respiratory failure, present on admission.  Although she reports using MDI inhalers in the past, no official diagnosis of COPD.  She is currently being treated as COPD with acute exacerbation given longstanding smoking history  for the last 60 years. Chest x-ray showing mild interstitial edema/atelectasis or early infiltrate.  No evidence of pulmonary embolism on CT scan.. IV steroids transition to p.o. prednisone. Continue albuterol Atrovent and Breo Ellipta. .  Patient initially received continuous BiPAP for hypercarbia with PCO2 in the 90s.-now transition to nocturnal use.  Repeat ABGs have shown improvement in PCO2 levels to 60s and 50s. Pro-calcitonin was less than 0.1.  MRSA screen was negative.  HIV non reactive.  Requested PCCM to evaluate if patient needs to be discharged on BiPAP or can be tapered to off and be discharged on continuous home O2 with outpatient referral for sleep study.  Chronic diastolic CHF: Clinically does not appear to be volume overloaded. BNP 248. 2D echocardiogram showed normal LV ejection fraction with diastolic dysfunction and right ventricular systolic function was preserved.. Received 1 dose of IV Lasix in earlier hospital course    Diabetes mellitus type 2. New diagnosis. Hemoglobin A1c of 6.8.  Advised diabetic diet and Metformin on discharge.  Add sliding scale while in hospital especially when on p.o. prednisone.  Will need follow-up with primary care physician as outpatient.Diabetes education consult  Elevated MCV anemia, mild thrombocytopenia.  TSH of 1.3 and within normal range.  Possibility of  vitamin B-12 deficiency.  Check vitamin B12 level, methylmalonic acid.  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  Smoking cessation was extensively discussed with the patient.  Ongoing tobacco use Continue nicotine patch.  Encouraged cessation of smoking.  Extensive counseling was done. She is motivated to quit. Would benefit from nicotine patch and lozenge on discharge   DVT prophylaxis: Lovenox  Code Status: Full code Family / Patient Communication: Discussed with patient Disposition Plan:   Status is: Inpatient  Remains inpatient appropriate because:Inpatient level of care appropriate due to severity of illness   Dispo: The patient is from: Home              Anticipated d/c is to: Home with [facility].  Lives with cousin              Anticipated d/c date is: 1 day if cleared by PCCM with or without home BiPAP/CPAP              Patient currently is not medically stable to d/c.            Time spent: 25 minutes     >50% time spent in discussions with care team and coordination of care.    Guilford Shi, MD [facility] Pager in [place]  If 7PM-7AM, please contact night-coverage [email or web address] 07/06/2020, 3:08 PM

## 2020-07-07 ENCOUNTER — Telehealth: Payer: Self-pay | Admitting: *Deleted

## 2020-07-07 DIAGNOSIS — E1169 Type 2 diabetes mellitus with other specified complication: Secondary | ICD-10-CM

## 2020-07-07 DIAGNOSIS — I5033 Acute on chronic diastolic (congestive) heart failure: Secondary | ICD-10-CM | POA: Insufficient documentation

## 2020-07-07 DIAGNOSIS — I5032 Chronic diastolic (congestive) heart failure: Secondary | ICD-10-CM

## 2020-07-07 DIAGNOSIS — D751 Secondary polycythemia: Secondary | ICD-10-CM

## 2020-07-07 DIAGNOSIS — E785 Hyperlipidemia, unspecified: Secondary | ICD-10-CM

## 2020-07-07 DIAGNOSIS — J449 Chronic obstructive pulmonary disease, unspecified: Secondary | ICD-10-CM

## 2020-07-07 DIAGNOSIS — E119 Type 2 diabetes mellitus without complications: Secondary | ICD-10-CM

## 2020-07-07 HISTORY — DX: Type 2 diabetes mellitus with other specified complication: E11.69

## 2020-07-07 HISTORY — DX: Chronic diastolic (congestive) heart failure: I50.32

## 2020-07-07 HISTORY — DX: Acute on chronic diastolic (congestive) heart failure: I50.33

## 2020-07-07 HISTORY — DX: Hyperlipidemia, unspecified: E78.5

## 2020-07-07 HISTORY — DX: Secondary polycythemia: D75.1

## 2020-07-07 LAB — GLUCOSE, CAPILLARY
Glucose-Capillary: 101 mg/dL — ABNORMAL HIGH (ref 70–99)
Glucose-Capillary: 150 mg/dL — ABNORMAL HIGH (ref 70–99)

## 2020-07-07 MED ORDER — IPRATROPIUM BROMIDE HFA 17 MCG/ACT IN AERS: 2.0000 | INHALATION_SPRAY | Freq: Two times a day (BID) | RESPIRATORY_TRACT | 12 refills | Status: DC

## 2020-07-07 MED ORDER — UMECLIDINIUM-VILANTEROL 62.5-25 MCG/INH IN AEPB: 1.0000 | INHALATION_SPRAY | Freq: Every day | RESPIRATORY_TRACT | 3 refills | Status: DC

## 2020-07-07 MED ORDER — NICOTINE 14 MG/24HR TD PT24: 14.0000 mg | MEDICATED_PATCH | Freq: Every day | TRANSDERMAL | 0 refills | Status: DC

## 2020-07-07 MED ORDER — METFORMIN HCL 500 MG PO TABS: 500.0000 mg | ORAL_TABLET | Freq: Two times a day (BID) | ORAL | 11 refills | Status: DC

## 2020-07-07 MED ORDER — AZITHROMYCIN 250 MG PO TABS: 250.0000 mg | ORAL_TABLET | Freq: Every day | ORAL | 0 refills | Status: AC

## 2020-07-07 MED ORDER — UMECLIDINIUM-VILANTEROL 62.5-25 MCG/INH IN AEPB
1.0000 | INHALATION_SPRAY | Freq: Every day | RESPIRATORY_TRACT | Status: DC
  Filled 2020-07-07: qty 14

## 2020-07-07 MED ORDER — PREDNISONE 20 MG PO TABS: 40.0000 mg | ORAL_TABLET | Freq: Every day | ORAL | 0 refills | Status: AC

## 2020-07-07 MED ORDER — ALBUTEROL SULFATE HFA 108 (90 BASE) MCG/ACT IN AERS: 2.0000 | INHALATION_SPRAY | Freq: Four times a day (QID) | RESPIRATORY_TRACT | 1 refills | Status: DC | PRN

## 2020-07-07 MED ORDER — FLUTICASONE FUROATE-VILANTEROL 100-25 MCG/INH IN AEPB
1.0000 | INHALATION_SPRAY | Freq: Every day | RESPIRATORY_TRACT | 1 refills | Status: DC
Start: 2020-07-08 — End: 2020-07-07

## 2020-07-07 MED ORDER — PANTOPRAZOLE SODIUM 40 MG PO TBEC
40.0000 mg | DELAYED_RELEASE_TABLET | Freq: Every day | ORAL | 0 refills | Status: DC
Start: 2020-07-08 — End: 2020-08-05

## 2020-07-07 MED ORDER — POLYETHYLENE GLYCOL 3350 17 G PO PACK: 17.0000 g | PACK | Freq: Every day | ORAL | 0 refills | Status: DC | PRN

## 2020-07-07 MED ORDER — NICOTINE POLACRILEX 2 MG MT GUM: 2.0000 mg | CHEWING_GUM | OROMUCOSAL | 0 refills | Status: DC | PRN

## 2020-07-07 NOTE — Care Management Important Message (Signed)
Important Message  Patient Details IM Letter given to the Patient Name: Deanna Petty MRN: [id number] Date of Birth: Mar 08, 1946   Medicare Important Message Given:  Yes     Kerin Salen 07/07/2020, 10:28 AM

## 2020-07-07 NOTE — Telephone Encounter (Signed)
Called pt and did not get an answer- LMTCB and will forward to the front desk pool for f/u

## 2020-07-07 NOTE — Discharge Summary (Addendum)
Physician Discharge Summary  Deanna Petty XBM:[id number] DOB: 11-Aug-1946 DOA: 07/03/2020  PCP: Pcp, No  Admit date: 07/03/2020 Discharge date: 07/07/2020 Consultations: Pulmonary  Admitted From: Home Disposition: Home  Discharge Diagnoses:  Principal Problem:   Acute hypoxemic respiratory failure ([place]) Active Problems:   Tobacco use   Type 2 diabetes mellitus without complication (HCC)   Chronic diastolic CHF (congestive heart failure) (HCC)   Polycythemia, secondary   Hospital Course Summary: 74 y.o.femalewith medical history significant for60-pack-year history without formal history of COPD or asthma presented to hospital with shortness of breath for 3 days with exertion. Patient was noted to be hypoxic at home saturating in the 60s and was brought in hospital for further evaluation and treatment. In the ED, she was noted to be hypercapnic, hypoxic, with an elevated BNP. She was placed on BiPAP with 4 L 02 and her oxygen saturations were maintained at greater than 90%. She remained mildly tachypneic. Patient was then considered for admission to the hospital.  Acute hypoxic and hypercapnic respiratory failure, present on admission.  Although she reports using MDI inhalers in the past, no official diagnosis of COPD.  She is currently being treated asCOPD with acute exacerbation given longstanding smoking history  for the last 60 years. Chest x-ray showing mild interstitial edema/atelectasis or early infiltrate. No evidence of pulmonary embolism on CT scan.. IV steroids transitioned to p.o. prednisone. She was on albuterol, Atrovent and Breo Ellipta while here -changed to Anoro-Ellipta on discharge as advised by Pulmonary.Patient initially received continuous BiPAP for hypercarbia with PCO2 in the 90s.-now transition to nocturnal use.  Repeat ABGs have shown improvement in PCO2 levels to 60s and 50s.Pro-calcitonin was less than 0.1. MRSA screen was negative. HIV non reactive.   Requested PCCM to evaluate if patient needs to be discharged on BiPAP or can be discharged with outpatient referral for sleep study. PCCM recommended outpatient follow up and treat as acute COPD excacerbation. Inhaler therapy optimized as mentioned above.  Patient's oxygen requirement gradually came down and was able to maintain saturations greater than 90% on walking.  She did not qualify for home O2.  Per PCCM recommendations, she will not need BiPAP on discharge.  She is advised to follow-up PCP and possibly repeat walking desat studies to ensure adequate saturation upon discharge.  Counseled to quit smoking and she requested a prescription for nicotine patch.  She will need PFTs upon follow-up with PCCM in 2 weeks.  Chronic diastolic CHF: Clinically does not appear to be volume overloaded.BNP 248. 2D echocardiogram showed normal LV ejection fraction with diastolic dysfunction and right ventricular systolic function was preserved..Received 1 dose of IV Lasix in earlier hospital course   Diabetes mellitus type 2.New diagnosis.Hemoglobin A1c of 6.8. Advised diabetic diet and Metformin on discharge. She was on sliding scale while in hospital especially when on p.o. prednisone.  Blood glucose levels have ranged from 1 10-220. Will need follow-up with primary care physician as outpatient.Diabetes education provided.   Elevated MCV anemia, mild thrombocytopenia.TSH of 1.3 and within normal range. Possibility of vitamin B-12 deficiency. Check vitamin B12 level, methylmalonic acid.  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 Smoking cessation was extensively discussed with the patient.  Ongoing tobacco use: Continue nicotine patch. Encouraged cessation of smoking. Extensive counseling was done. She is motivated to quit. Would benefit fromnicotine patch and lozenge on discharge    Discharge Exam:   Vitals:   07/06/20 2017 07/06/20 2109 07/07/20 0440  07/07/20 0855  BP:  Marland Kitchen)  162/56 (!) 105/58   Pulse:  78 62   Resp:  18 16   Temp:  98 F (36.7 C) (!) 97.5 F (36.4 C)   TempSrc:  Oral Oral   SpO2: 95% 90% 94% (!) 87%  Weight:      Height:        General: Pt is alert, awake, not in acute distress Cardiovascular: RRR, S1/S2 +, no rubs, no gallops Respiratory: CTA bilaterally, no wheezing, no rhonchi Abdominal: Soft, NT, ND, bowel sounds + Extremities: no edema, no cyanosis  Discharge Condition:Stable CODE STATUS: Full code Diet recommendation: Carb modified, heart healthy Recommendations for Outpatient Follow-up:  1. Follow up with PCP: 5-7 days 2. Follow up with consultants: [facility] in 2 weeks 3. Please obtain follow up labs including:   Home Health services upon discharge: none Equipment/Devices upon discharge:none   Discharge Instructions:  Discharge Instructions    Call MD for:  difficulty breathing, headache or visual disturbances   Complete by: As directed    Call MD for:  persistant dizziness or light-headedness   Complete by: As directed    Call MD for:  severe uncontrolled pain   Complete by: As directed    Call MD for:  temperature >100.4   Complete by: As directed    Diet - low sodium heart healthy   Complete by: As directed    Increase activity slowly   Complete by: As directed      Allergies as of 07/07/2020   No Known Allergies     Medication List    TAKE these medications   albuterol 108 (90 Base) MCG/ACT inhaler Commonly known as: VENTOLIN HFA Inhale 2 puffs into the lungs every 6 (six) hours as needed for wheezing or shortness of breath.   azithromycin 250 MG tablet Commonly known as: ZITHROMAX Take 1 tablet (250 mg total) by mouth daily for 2 days.   cholecalciferol 25 MCG (1000 UNIT) tablet Commonly known as: VITAMIN D3 Take 1,000 Units by mouth daily.   ibuprofen 200 MG tablet Commonly known as: ADVIL Take 400 mg by mouth every 6 (six) hours as needed for pain.   metFORMIN  500 MG tablet Commonly known as: Glucophage Take 1 tablet (500 mg total) by mouth 2 (two) times daily with a meal.   nicotine 14 mg/24hr patch Commonly known as: NICODERM CQ - dosed in mg/24 hours Place 1 patch (14 mg total) onto the skin daily. Start taking on: July 08, 2020   nicotine polacrilex 2 MG gum Commonly known as: NICORETTE Take 1 each (2 mg total) by mouth as needed for smoking cessation.   Omega-3 1000 MG Caps Take 1 capsule by mouth daily.   pantoprazole 40 MG tablet Commonly known as: PROTONIX Take 1 tablet (40 mg total) by mouth daily. Start taking on: July 08, 2020   polyethylene glycol 17 g packet Commonly known as: MIRALAX / GLYCOLAX Take 17 g by mouth daily as needed for mild constipation.   predniSONE 20 MG tablet Commonly known as: DELTASONE Take 2 tablets (40 mg total) by mouth daily with breakfast for 2 days. Start taking on: July 08, 2020   umeclidinium-vilanterol 62.5-25 MCG/INH Aepb Commonly known as: ANORO ELLIPTA Inhale 1 puff into the lungs daily.       Follow-up Information    [facility] .        Primary care physician. Schedule an appointment as soon as possible for a visit.  No Known Allergies    The results of significant diagnostics from this hospitalization (including imaging, microbiology, ancillary and laboratory) are listed below for reference.    Labs: BNP (last 3 results) Recent Labs    07/03/20 1830 07/04/20 0316  BNP 247.4* 102.5*   Basic Metabolic Panel: Recent Labs  Lab 07/03/20 1830 07/03/20 1849 07/04/20 0316 07/04/20 1115 07/06/20 0513  NA 135 140 139 140 140  K 4.1 4.1 5.4* 4.5 3.9  CL 93* 93* 94*  --  95*  CO2 35*  --  34*  --  36*  GLUCOSE 186* 186* 171*  --  113*  BUN 11 11 11   --  18  CREATININE 0.66 0.70 0.69  --  0.69  CALCIUM 8.3*  --  8.9  --  9.1   Liver Function Tests: Recent Labs  Lab 07/03/20 1830  AST 46*  ALT 60*  ALKPHOS 95  BILITOT 0.5   PROT 8.0  ALBUMIN 4.0   No results for input(s): LIPASE, AMYLASE in the last 168 hours. No results for input(s): AMMONIA in the last 168 hours. CBC: Recent Labs  Lab 07/03/20 1830 07/03/20 1849 07/04/20 0316 07/04/20 1115 07/06/20 0513  WBC 7.8  --  6.4  --  8.5  NEUTROABS 6.9  --   --   --   --   HGB 15.2* 16.7* 15.3* 15.6* 15.5*  HCT 49.4* 49.0* 49.5* 46.0 48.0*  MCV 101.6*  --  101.6*  --  97.8  PLT 144*  --  130*  --  172   Cardiac Enzymes: No results for input(s): CKTOTAL, CKMB, CKMBINDEX, TROPONINI in the last 168 hours. BNP: Invalid input(s): POCBNP CBG: Recent Labs  Lab 07/06/20 1208 07/06/20 1633 07/06/20 2200 07/07/20 0733 07/07/20 1130  GLUCAP 159* 247* 168* 101* 150*   D-Dimer No results for input(s): DDIMER in the last 72 hours. Hgb A1c No results for input(s): HGBA1C in the last 72 hours. Lipid Profile Recent Labs    07/06/20 0513  CHOL 149  HDL 50  LDLCALC 83  TRIG 80  CHOLHDL 3.0   Thyroid function studies No results for input(s): TSH, T4TOTAL, T3FREE, THYROIDAB in the last 72 hours.  Invalid input(s): FREET3 Anemia work up Recent Labs    07/05/20 1129  VITAMINB12 275   Urinalysis No results found for: COLORURINE, [place], LABSPEC, [place], GLUCOSEU, [place], [place], [place], PROTEINUR, UROBILINOGEN, NITRITE, LEUKOCYTESUR Sepsis Labs Invalid input(s): PROCALCITONIN,  WBC,  LACTICIDVEN Microbiology Recent Results (from the past 240 hour(s))  SARS Coronavirus 2 by RT PCR (hospital order, performed in [facility] hospital lab) Nasopharyngeal Nasopharyngeal Swab     Status: None   Collection Time: 07/03/20  6:12 PM   Specimen: Nasopharyngeal Swab  Result Value Ref Range Status   SARS Coronavirus 2 NEGATIVE NEGATIVE Final    Comment: (NOTE) SARS-CoV-2 target nucleic acids are NOT DETECTED.  The SARS-CoV-2 RNA is generally detectable in upper and lower respiratory specimens during the acute phase of infection. The  lowest concentration of SARS-CoV-2 viral copies this assay can detect is 250 copies / mL. A negative result does not preclude SARS-CoV-2 infection and should not be used as the sole basis for treatment or other patient management decisions.  A negative result may occur with improper specimen collection / handling, submission of specimen other than nasopharyngeal swab, presence of viral mutation(s) within the areas targeted by this assay, and inadequate number of viral copies (<250 copies / mL). A negative result must be combined with  clinical observations, patient history, and epidemiological information.  Fact Sheet for Patients:   StrictlyIdeas.no  Fact Sheet for Healthcare Providers: BankingDealers.co.za  This test is not yet approved or  cleared by the [place] FDA and has been authorized for detection and/or diagnosis of SARS-CoV-2 by FDA under an Emergency Use Authorization (EUA).  This EUA will remain in effect (meaning this test can be used) for the duration of the COVID-19 declaration under Section 564(b)(1) of the Act, 21 U.S.C. section 360bbb-3(b)(1), unless the authorization is terminated or revoked sooner.  Performed at [facility], [place] [address], [place], [place] [address]   MRSA PCR Screening     Status: None   Collection Time: 07/03/20 11:57 PM   Specimen: Nasal Mucosa; Nasopharyngeal  Result Value Ref Range Status   MRSA by PCR NEGATIVE NEGATIVE Final    Comment:        The GeneXpert MRSA Assay (FDA approved for NASAL specimens only), is one component of a comprehensive MRSA colonization surveillance program. It is not intended to diagnose MRSA infection nor to guide or monitor treatment for MRSA infections. Performed at [facility], [place] [address], [place], [place] [address]   Respiratory Panel by PCR     Status: None   Collection Time: 07/04/20 12:01 PM    Specimen: Nasopharyngeal Swab; Respiratory  Result Value Ref Range Status   Adenovirus NOT DETECTED NOT DETECTED Final   Coronavirus 229E NOT DETECTED NOT DETECTED Final    Comment: (NOTE) The Coronavirus on the Respiratory Panel, DOES NOT test for the novel  Coronavirus (2019 nCoV)    Coronavirus HKU1 NOT DETECTED NOT DETECTED Final   Coronavirus NL63 NOT DETECTED NOT DETECTED Final   Coronavirus OC43 NOT DETECTED NOT DETECTED Final   Metapneumovirus NOT DETECTED NOT DETECTED Final   Rhinovirus / Enterovirus NOT DETECTED NOT DETECTED Final   Influenza A NOT DETECTED NOT DETECTED Final   Influenza B NOT DETECTED NOT DETECTED Final   Parainfluenza Virus 1 NOT DETECTED NOT DETECTED Final   Parainfluenza Virus 2 NOT DETECTED NOT DETECTED Final   Parainfluenza Virus 3 NOT DETECTED NOT DETECTED Final   Parainfluenza Virus 4 NOT DETECTED NOT DETECTED Final   Respiratory Syncytial Virus NOT DETECTED NOT DETECTED Final   Bordetella pertussis NOT DETECTED NOT DETECTED Final   Chlamydophila pneumoniae NOT DETECTED NOT DETECTED Final   Mycoplasma pneumoniae NOT DETECTED NOT DETECTED Final    Comment: Performed at [facility], [place]. [address], [place], [place] [address]    Procedures/Studies: CT Angio Chest PE W and/or Wo Contrast  Result Date: 07/03/2020 CLINICAL DATA:  Shortness of breath x3 days. EXAM: CT ANGIOGRAPHY CHEST WITH CONTRAST TECHNIQUE: Multidetector CT imaging of the chest was performed using the standard protocol during bolus administration of intravenous contrast. Multiplanar CT image reconstructions and MIPs were obtained to evaluate the vascular anatomy. CONTRAST:  189mL OMNIPAQUE IOHEXOL 350 MG/ML SOLN COMPARISON:  None. FINDINGS: Cardiovascular: Moderate to marked severity calcification of the aortic arch is seen. Satisfactory opacification of the pulmonary arteries to the segmental level. No evidence of pulmonary embolism. Normal heart size with moderate severity coronary  artery calcification. No pericardial effusion. Mediastinum/Nodes: No enlarged mediastinal, hilar, or axillary lymph nodes. Thyroid gland, trachea, and esophagus demonstrate no significant findings. Lungs/Pleura: Very mild patchy posteromedial right lower lobe and posteromedial left lower lobe atelectasis and/or early infiltrate is seen. There is no evidence of a pleural effusion or pneumothorax. Upper Abdomen: Subcentimeter gallstones are  seen within the gallbladder lumen. Musculoskeletal: A chronic versus congenital deformity is seen along the distal aspect of the right clavicle. Degenerative changes are seen throughout the thoracic spine. Review of the MIP images confirms the above findings. IMPRESSION: 1. No evidence of pulmonary embolism. 2. Very mild patchy posteromedial right lower lobe and posteromedial left lower lobe atelectasis and/or early infiltrate. 3. Cholelithiasis. 4. Chronic versus congenital deformity along the distal aspect of the right clavicle. 5. Aortic atherosclerosis. Aortic Atherosclerosis (ICD10-I70.0). Electronically Signed   By: Virgina Norfolk M.D.   On: 07/03/2020 19:39   DG Chest Port 1 View  Result Date: 07/03/2020 CLINICAL DATA:  Shortness of breath x-ray days, current smoker EXAM: PORTABLE CHEST 1 VIEW. Bilateral costophrenic angles collimated off the COMPARISON:  Chest x-ray 06/03/2013 FINDINGS: The heart size and mediastinal contours are unchanged. Aortic arch calcifications. Bibasilar increased interstitial markings. Hazy airspace opacity within bilateral lower lobes. Limited evaluation for pleural effusion with costophrenic angles collimated off view. No pneumothorax. No acute osseous abnormality. IMPRESSION: 1. Increased interstitial markings and hazy airspace opacities within bilateral lower lung zones suggestive of atypical/viral pneumonia. Differential diagnosis includes pulmonary edema. 2. No definite pleural effusion; however, bilateral costophrenic angles collimated  off view and overlying soft tissues. Electronically Signed   By: Iven Finn M.D.   On: 07/03/2020 18:27   ECHOCARDIOGRAM COMPLETE  Result Date: 07/04/2020    ECHOCARDIOGRAM REPORT   Patient Name:   Deanna Petty Date of Exam: 07/04/2020 Medical Rec #:  [id number]     Height:       65.0 in Accession #:    [id number]    Weight:       200.0 lb Date of Birth:  1945-11-02    BSA:          1.978 m Patient Age:    74 years      BP:           155/99 mmHg Patient Gender: F             HR:           70 bpm. Exam Location:  Inpatient Procedure: 2D Echo, Cardiac Doppler and Color Doppler Indications:    Acute Respiratory Insufficiency 518.82 / R06.89  History:        Patient has no prior history of Echocardiogram examinations.                 Signs/Symptoms:Dyspnea; Risk Factors:Current Smoker.  Sonographer:    Jonelle Sidle Dance Referring Phys: [id number] [address] Dakins PRATT  Sonographer Comments: No subcostal window. IMPRESSIONS  1. Left ventricular ejection fraction, by estimation, is 60 to 65%. The left ventricle has normal function. Left ventricular endocardial border not optimally defined to evaluate regional wall motion. There is mild left ventricular hypertrophy. Left ventricular diastolic parameters are consistent with Grade I diastolic dysfunction (impaired relaxation). Elevated left atrial pressure.  2. Right ventricular systolic function is normal. The right ventricular size is normal.  3. The mitral valve is normal in structure. No evidence of mitral valve regurgitation. No evidence of mitral stenosis.  4. The aortic valve is tricuspid. Aortic valve regurgitation is not visualized. No aortic stenosis is present. FINDINGS  Left Ventricle: Possible apical hypokinesis, difficult visualization. Left ventricular ejection fraction, by estimation, is 60 to 65%. The left ventricle has normal function. Left ventricular endocardial border not optimally defined to evaluate regional  wall motion. The left ventricular internal cavity  size was normal in size. There is mild left  ventricular hypertrophy. Left ventricular diastolic parameters are consistent with Grade I diastolic dysfunction (impaired relaxation). Elevated left atrial pressure. Right Ventricle: The right ventricular size is normal. No increase in right ventricular wall thickness. Right ventricular systolic function is normal. Left Atrium: Left atrial size was normal in size. Right Atrium: Right atrial size was normal in size. Pericardium: There is no evidence of pericardial effusion. Mitral Valve: The mitral valve is normal in structure. No evidence of mitral valve regurgitation. No evidence of mitral valve stenosis. Tricuspid Valve: The tricuspid valve is normal in structure. Tricuspid valve regurgitation is not demonstrated. No evidence of tricuspid stenosis. Aortic Valve: The aortic valve is tricuspid. Aortic valve regurgitation is not visualized. No aortic stenosis is present. Pulmonic Valve: The pulmonic valve was not well visualized. Pulmonic valve regurgitation is not visualized. No evidence of pulmonic stenosis. Aorta: The aortic root is normal in size and structure. Pulmonary Artery: Indeterminant PASP, inadequate TR jet. IAS/Shunts: The interatrial septum was not well visualized.  LEFT VENTRICLE PLAX 2D LVIDd:         3.90 cm  Diastology LVIDs:         2.70 cm  LV e' medial:    5.11 cm/s LV PW:         1.20 cm  LV E/e' medial:  23.9 LV IVS:        1.20 cm  LV e' lateral:   5.22 cm/s LVOT diam:     1.80 cm  LV E/e' lateral: 23.4 LVOT Area:     2.54 cm  RIGHT VENTRICLE RV Basal diam:  1.80 cm RV S prime:     9.57 cm/s TAPSE (M-mode): 1.6 cm LEFT ATRIUM             Index       RIGHT ATRIUM           Index LA diam:        4.00 cm 2.02 cm/m  RA Area:     12.50 cm LA Vol (A2C):   29.5 ml 14.91 ml/m RA Volume:   29.40 ml  14.86 ml/m LA Vol (A4C):   14.1 ml 7.13 ml/m LA Biplane Vol: 20.8 ml 10.51 ml/m   AORTA Ao Root diam: 3.00 cm Ao Asc diam:  3.20 cm MITRAL VALVE MV Area  (PHT): 3.08 cm     SHUNTS MV Decel Time: 246 msec     Systemic Diam: 1.80 cm MV E velocity: 122.00 cm/s MV A velocity: 133.00 cm/s MV E/A ratio:  0.92 Carlyle Dolly MD Electronically signed by Carlyle Dolly MD Signature Date/Time: 07/04/2020/10:46:26 AM    Final     Time coordinating discharge: Over 30 minutes  SIGNED:   Guilford Shi, MD  [facility] 07/07/2020, 12:51 PM

## 2020-07-07 NOTE — TOC Initial Note (Addendum)
Transition of Care [facility]) - Initial/Assessment Note    Patient Details  Name: Deanna Petty MRN: [id number] Date of Birth: 02/26/1946  Transition of Care [facility]) CM/SW Contact:    Dessa Phi, RN Phone Number: 07/07/2020, 10:32 AM  Clinical Narrative: Spoke to patient about d/c plans-Has family support.declines any HHC, has rw,has transportation. Awaiting confirmation of home 02 if qualifies.will order home 02 if qualifies. Provided w/pcp resource.                 Expected Discharge Plan: Home/Self Care Barriers to Discharge: No Barriers Identified   Patient Goals and CMS Choice Patient states their goals for this hospitalization and ongoing recovery are:: go home CMS Medicare.gov Compare Post Acute Care list provided to:: Patient Choice offered to / list presented to : Patient  Expected Discharge Plan and Services Expected Discharge Plan: Home/Self Care   Discharge Planning Services: CM Consult   Living arrangements for the past 2 months: Single Family Home Expected Discharge Date: 07/07/20                         HH Arranged: Refused HH          Prior Living Arrangements/Services Living arrangements for the past 2 months: Single Family Home Lives with:: Self Patient language and need for interpreter reviewed:: Yes Do you feel safe going back to the place where you live?: Yes      Need for Family Participation in Patient Care: No (Comment) Care giver support system in place?: Yes (comment) Current home services: DME (rw) Criminal Activity/Legal Involvement Pertinent to Current Situation/Hospitalization: No - Comment as needed  Activities of Daily Living Home Assistive Devices/Equipment: Eyeglasses, Shower chair with back, Wheelchair, Environmental consultant (specify type), Cane (specify quad or straight) (quad and straight) ADL Screening (condition at time of admission) Patient's cognitive ability adequate to safely complete daily activities?: Yes Is the patient deaf or have  difficulty hearing?: Yes Does the patient have difficulty seeing, even when wearing glasses/contacts?: Yes Does the patient have difficulty concentrating, remembering, or making decisions?: No Patient able to express need for assistance with ADLs?: Yes Does the patient have difficulty dressing or bathing?: No Independently performs ADLs?: Yes (appropriate for developmental age) Does the patient have difficulty walking or climbing stairs?: Yes Weakness of Legs: Both Weakness of Arms/Hands: None  Permission Sought/Granted Permission sought to share information with : Case Manager Permission granted to share information with : Yes, Verbal Permission Granted  Share Information with NAME: Case Manager           Emotional Assessment Appearance:: Appears stated age Attitude/Demeanor/Rapport: Gracious Affect (typically observed): Accepting Orientation: : Oriented to Self, Oriented to Place, Oriented to  Time, Oriented to Situation Alcohol / Substance Use: Not Applicable Psych Involvement: No (comment)  Admission diagnosis:  SOB (shortness of breath) [R06.02] Hypoxia [R09.02] Acute hypoxemic respiratory failure (HCC) [J96.01] Patient Active Problem List   Diagnosis Date Noted  . Acute hypoxemic respiratory failure ([place]) 07/03/2020  . Tobacco use 07/03/2020  . Elevated brain natriuretic peptide (BNP) level 07/03/2020  . Abnormal EKG 06/07/2013  . Left breast mass 05/14/2013   PCP:  Pcp, No Pharmacy:   [facility] [address] ([address]), [place] [address] [employer] ([place]) [place] [address] Phone: [phone] Fax: [phone]     Social Determinants of Health (SDOH) Interventions    Readmission Risk Interventions No flowsheet data found.

## 2020-07-07 NOTE — Telephone Encounter (Signed)
-----   Message from Marshell Garfinkel, MD sent at 07/07/2020  9:00 AM EDT ----- Regarding: Follow up Please make follow-up appointment in clinic after hospital discharge with me or nurse practitioner in 3 to 4 weeks.

## 2020-07-07 NOTE — Progress Notes (Signed)
SATURATION QUALIFICATIONS: (This note is used to comply with regulatory documentation for home oxygen)  Patient Saturations on Room Air at Rest = 89%  Patient Saturations on Room Air while Ambulating = 91%  Patient Saturations on 0 Liters of oxygen while Ambulating = %  Please briefly explain why patient needs home oxygen:

## 2020-07-09 NOTE — Telephone Encounter (Signed)
Called and left message on pt vm to call back to schedule hospital f/u -pr

## 2020-07-13 LAB — METHYLMALONIC ACID(MMA), RND URINE
Creatinine(Crt), U: 0.19 g/L — ABNORMAL LOW (ref 0.30–3.00)
MMA - Normalized: 1.8 umol/mmol cr (ref 0.5–3.4)
Methylmalonic Acid, Ur: 3.1 umol/L (ref 1.6–29.7)

## 2020-08-05 ENCOUNTER — Encounter: Payer: Self-pay | Admitting: Pulmonary Disease

## 2020-08-05 ENCOUNTER — Other Ambulatory Visit: Payer: Self-pay

## 2020-08-05 ENCOUNTER — Ambulatory Visit (INDEPENDENT_AMBULATORY_CARE_PROVIDER_SITE_OTHER): Payer: Medicare Other | Admitting: Pulmonary Disease

## 2020-08-05 VITALS — BP 138/62 | HR 80 | Temp 97.3°F | Ht 65.0 in | Wt 195.2 lb

## 2020-08-05 DIAGNOSIS — J449 Chronic obstructive pulmonary disease, unspecified: Secondary | ICD-10-CM

## 2020-08-05 DIAGNOSIS — G4733 Obstructive sleep apnea (adult) (pediatric): Secondary | ICD-10-CM

## 2020-08-05 LAB — BASIC METABOLIC PANEL
BUN: 16 mg/dL (ref 6–23)
CO2: 30 mEq/L (ref 19–32)
Calcium: 9.5 mg/dL (ref 8.4–10.5)
Chloride: 99 mEq/L (ref 96–112)
Creatinine, Ser: 0.91 mg/dL (ref 0.40–1.20)
GFR: 62.66 mL/min (ref >60.00)
Glucose, Bld: 125 mg/dL — ABNORMAL HIGH (ref 70–99)
Potassium: 4.4 mEq/L (ref 3.5–5.1)
Sodium: 137 mEq/L (ref 135–145)

## 2020-08-05 LAB — CBC WITH DIFFERENTIAL/PLATELET
Basophils Absolute: 0.1 10*3/uL (ref 0.0–0.1)
Basophils Relative: 1.1 % (ref 0.0–3.0)
Eosinophils Absolute: 0.3 10*3/uL (ref 0.0–0.7)
Eosinophils Relative: 4.2 % (ref 0.0–5.0)
HCT: 46 % (ref 36.0–46.0)
Hemoglobin: 15.2 g/dL — ABNORMAL HIGH (ref 12.0–15.0)
Lymphocytes Relative: 27.2 % (ref 12.0–46.0)
Lymphs Abs: 2 10*3/uL (ref 0.7–4.0)
MCHC: 33.1 g/dL (ref 30.0–36.0)
MCV: 93 fl (ref 78.0–100.0)
Monocytes Absolute: 0.6 10*3/uL (ref 0.1–1.0)
Monocytes Relative: 8 % (ref 3.0–12.0)
Neutro Abs: 4.4 10*3/uL (ref 1.4–7.7)
Neutrophils Relative %: 59.5 % (ref 43.0–77.0)
Platelets: 152 10*3/uL (ref 150.0–400.0)
RBC: 4.94 Mil/uL (ref 3.87–5.11)
RDW: 13.4 % (ref 11.5–15.5)
WBC: 7.4 10*3/uL (ref 4.0–10.5)

## 2020-08-05 MED ORDER — NICOTINE 21 MG/24HR TD PT24: 21.0000 mg | MEDICATED_PATCH | Freq: Every day | TRANSDERMAL | 5 refills | Status: DC

## 2020-08-05 NOTE — Progress Notes (Signed)
Deanna Petty    [id number]    Feb 14, 1946  Primary Care Physician:Pcp, No  Referring Physician: No referring provider defined for this encounter.  Chief complaint:  Follow up for COPD exacerbation, hypercarbic respiratory failure  HPI: 73 year old heavy smoker admitted in September with hypoxic, hypercarbic respiratory failure in the setting of COPD exacerbation. PCCM consulted for hypoxic, hypercarbic respiratory failure and need for trilogy vent at home Assessment at that time was that she needs to get over her acute exacerbation and reassess in office after optimal treatment of COPD  She had been told that she had COPD but no PFTs are on record.  She had been on inhalers but ran out of medications around 2018 and never refilled them.  Pets: No pets Occupation: Retired Geophysicist/field seismologist Exposures: No known exposures.  No mold, hot tub, Jacuzzi.  No feather pillows or comforters Smoking history: Greater than 100-pack-year smoker.  Quit in September 2021 after hospitalization Travel history: No significant travel history Relevant family history: No significant family history of lung disease.  Outpatient Encounter Medications as of 08/05/2020  Medication Sig   albuterol (VENTOLIN HFA) 108 (90 Base) MCG/ACT inhaler Inhale 2 puffs into the lungs every 6 (six) hours as needed for wheezing or shortness of breath.   cholecalciferol (VITAMIN D3) 25 MCG (1000 UNIT) tablet Take 1,000 Units by mouth daily.   ibuprofen (ADVIL,MOTRIN) 200 MG tablet Take 400 mg by mouth every 6 (six) hours as needed for pain.   metFORMIN (GLUCOPHAGE) 500 MG tablet Take 1 tablet (500 mg total) by mouth 2 (two) times daily with a meal.   nicotine (NICODERM CQ - DOSED IN MG/24 HOURS) 14 mg/24hr patch Place 1 patch (14 mg total) onto the skin daily.   nicotine polacrilex (NICORETTE) 2 MG gum Take 1 each (2 mg total) by mouth as needed for smoking cessation.   Omega-3 1000 MG CAPS Take 1 capsule by mouth  daily.   pantoprazole (PROTONIX) 40 MG tablet Take 1 tablet (40 mg total) by mouth daily.   polyethylene glycol (MIRALAX / GLYCOLAX) 17 g packet Take 17 g by mouth daily as needed for mild constipation.   umeclidinium-vilanterol (ANORO ELLIPTA) 62.5-25 MCG/INH AEPB Inhale 1 puff into the lungs daily.   No facility-administered encounter medications on file as of 08/05/2020.    Allergies as of 08/05/2020   (No Known Allergies)    Past Medical History:  Diagnosis Date   Bronchitis    Cigarette smoker    Pneumonia    Shortness of breath    with exertion; lasted used inhaler 06/02/13    Past Surgical History:  Procedure Laterality Date   ABDOMINAL HYSTERECTOMY     BREAST LUMPECTOMY WITH NEEDLE LOCALIZATION Left 06/12/2013   Procedure: LEFT BREAST LUMPECTOMY WITH NEEDLE LOCALIZATION;  Surgeon: Imogene Burn. Georgette Dover, MD;  Location: [facility];  Service: General;  Laterality: Left;   BREAST SURGERY      Family History  Problem Relation Age of Onset   Cancer Mother        oral, brain   Diabetes Father     Social History   Socioeconomic History   Marital status: Single    Spouse name: Not on file   Number of children: Not on file   Years of education: Not on file   Highest education level: Not on file  Occupational History   Not on file  Tobacco Use   Smoking status: Former Smoker  Packs/day: 0.25    Years: 40.00    Pack years: 10.00    Types: Cigarettes    Quit date: 07/03/2020    Years since quitting: 0.0   Smokeless tobacco: Never Used  Vaping Use   Vaping Use: Never used  Substance and Sexual Activity   Alcohol use: Yes    Comment: rare   Drug use: No   Sexual activity: Not on file  Other Topics Concern   Not on file  Social History Narrative   Not on file   Social Determinants of Health   Financial Resource Strain:    Difficulty of Paying Living Expenses: Not on file  Food Insecurity:    Worried About Charity fundraiser in the Last  Year: Not on file   [employer] of Food in the Last Year: Not on file  Transportation Needs:    Lack of Transportation (Medical): Not on file   Lack of Transportation (Non-Medical): Not on file  Physical Activity:    Days of Exercise per Week: Not on file   Minutes of Exercise per Session: Not on file  Stress:    Feeling of Stress : Not on file  Social Connections:    Frequency of Communication with Friends and Family: Not on file   Frequency of Social Gatherings with Friends and Family: Not on file   Attends Religious Services: Not on file   Active Member of Clubs or Organizations: Not on file   Attends Archivist Meetings: Not on file   Marital Status: Not on file  Intimate Partner Violence:    Fear of Current or Ex-Partner: Not on file   Emotionally Abused: Not on file   Physically Abused: Not on file   Sexually Abused: Not on file    Review of systems: Review of Systems  Constitutional: Negative for fever and chills.  HENT: Negative.   Eyes: Negative for blurred vision.  Respiratory: as per HPI  Cardiovascular: Negative for chest pain and palpitations.  Gastrointestinal: Negative for vomiting, diarrhea, blood per rectum. Genitourinary: Negative for dysuria, urgency, frequency and hematuria.  Musculoskeletal: Negative for myalgias, back pain and joint pain.  Skin: Negative for itching and rash.  Neurological: Negative for dizziness, tremors, focal weakness, seizures and loss of consciousness.  Endo/Heme/Allergies: Negative for environmental allergies.  Psychiatric/Behavioral: Negative for depression, suicidal ideas and hallucinations.  All other systems reviewed and are negative.  Physical Exam: Blood pressure 138/62, pulse 80, temperature (!) 97.3 F (36.3 C), temperature source Skin, height 5\' 5"  (1.651 m), weight 195 lb 3.2 oz (88.5 kg), SpO2 97 %. Gen:      No acute distress HEENT:  EOMI, sclera anicteric Neck:     No masses; no  thyromegaly Lungs:    Clear to auscultation bilaterally; normal respiratory effort CV:         Regular rate and rhythm; no murmurs Abd:      + bowel sounds; soft, non-tender; no palpable masses, no distension Ext:    No edema; adequate peripheral perfusion Skin:      Warm and dry; no rash Neuro: alert and oriented x 3 Psych: normal mood and affect  Data Reviewed: Imaging: Chest x-ray 07/03/20-hazy interstitial lower lung opacities.  CTA 07/03/20-no PE, minimal lower lobe atelectasis. I have reviewed the images personally  PFTs:  Labs:  Cardiac: Echo 07/04/20 LVEF 60-65%, mild LVH grade 1 diastolic dysfunction.  RV systolic function is normal  Assessment:  COPD She has a recent admission for  hypercarbic, hypoxic respiratory failure in setting of COPD that is untreated and ongoing smoking Improving post discharge Started on Anoro inhaler which we will continue Check baseline CBC differential, IgE, alpha-1 antitrypsin levels and phenotype and metabolic panel.  May need noninvasive ventilation if she continues to have elevated bicarb and OSA ruled out on home sleep study.  Ex-smoker Quit smoking last month.  Prescribe nicotine patches to help with cessation She will need low-dose screening CT of chest ordered starting in 2022.  Plan/Recommendations: Anoro CBC, IgE, alpha-1 antitrypsin levels and phenotype, metabolic panel PFTs, home sleep study  Marshell Garfinkel MD [facility] 08/05/2020, 11:44 AM  CC: No ref. provider found

## 2020-08-05 NOTE — Patient Instructions (Addendum)
I am glad you are doing better after recent hospitalization Continue the Anoro inhaler We will call in a prescription for nicotine patches 21 mg to help with smoking cessation  We will check some labs today including CBC differential, IgE, alpha-1 antitrypsin levels and phenotype and basic metabolic panel We will schedule pulmonary function test and home sleep study for better evaluation of COPD and sleep apnea  Follow-up in 3 months.

## 2020-08-10 NOTE — Progress Notes (Signed)
Called and spoke with patient about lab results per Dr Vaughan Browner. All questions answered and patient expressed understanding. Nothing further needed at this time.

## 2020-08-12 LAB — ALPHA-1 ANTITRYPSIN PHENOTYPE: A-1 Antitrypsin, Ser: 147 mg/dL (ref 83–199)

## 2020-08-12 LAB — IGE: IgE (Immunoglobulin E), Serum: 739 kU/L — ABNORMAL HIGH (ref <=114)

## 2020-08-13 ENCOUNTER — Telehealth: Payer: Self-pay | Admitting: Pulmonary Disease

## 2020-08-13 NOTE — Telephone Encounter (Signed)
Lm for Deanna Petty, Tourist information centre manager.

## 2020-08-21 NOTE — Telephone Encounter (Signed)
Spoke with Baxter Flattery, Case Manager, she stated she spoke with someone else that clarified that the request was for a sleep study and that they were waiting on authorization and would call the patient when it was ready.  Nothing further needed.

## 2020-08-21 NOTE — Telephone Encounter (Signed)
lmtcb for [employer].

## 2020-09-08 ENCOUNTER — Other Ambulatory Visit: Payer: Self-pay

## 2020-09-08 ENCOUNTER — Ambulatory Visit: Payer: Medicare Other

## 2020-09-08 DIAGNOSIS — G4733 Obstructive sleep apnea (adult) (pediatric): Secondary | ICD-10-CM

## 2020-09-16 DIAGNOSIS — G4733 Obstructive sleep apnea (adult) (pediatric): Secondary | ICD-10-CM | POA: Diagnosis not present

## 2020-10-20 ENCOUNTER — Encounter: Payer: Self-pay | Admitting: Pulmonary Disease

## 2020-10-20 ENCOUNTER — Other Ambulatory Visit: Payer: Self-pay | Admitting: *Deleted

## 2020-10-20 DIAGNOSIS — G4733 Obstructive sleep apnea (adult) (pediatric): Secondary | ICD-10-CM

## 2020-10-20 NOTE — Progress Notes (Unsigned)
Called and spoke Patient.  Sleep study results given.  Patient instruted to schedule follow up OV with Dr. Isaiah Serge 30 to 90 days after starting cpap for insurnace compliance. Understanding stated.  DME order placed. Nothing further at this time.

## 2020-10-20 NOTE — Progress Notes (Unsigned)
Sleep study 11/23 does show mild sleep apnea Please inform patient and order auto CPAP 5-15  Chilton Greathouse MD [facility] 10/20/2020, 9:25 AM

## 2020-12-16 ENCOUNTER — Other Ambulatory Visit: Payer: Self-pay | Admitting: Registered Nurse

## 2020-12-16 DIAGNOSIS — E2839 Other primary ovarian failure: Secondary | ICD-10-CM

## 2020-12-16 DIAGNOSIS — Z1231 Encounter for screening mammogram for malignant neoplasm of breast: Secondary | ICD-10-CM

## 2020-12-24 ENCOUNTER — Telehealth: Payer: Self-pay

## 2020-12-24 NOTE — Telephone Encounter (Signed)
   Deanna Petty DOB: 28-Aug-1946 MRN: [id number]   RIDER WAIVER AND RELEASE OF LIABILITY  For purposes of improving physical access to our facilities, [facility] is pleased to partner with third parties to provide [facility] patients or other authorized individuals the option of convenient, on-demand ground transportation services (the [place]") through use of the technology service that enables users to request on-demand ground transportation from independent third-party providers.  By opting to use and accept these [employer], I, the undersigned, hereby agree on behalf of myself, and on behalf of any minor child using the [employer] for whom I am the parent or legal guardian, as follows:  1. Government social research officer provided to me are provided by independent third-party transportation providers who are not [employer] or employees and who are unaffiliated with [employer]. 2. [facility] is neither a transportation carrier nor a common or public carrier. 3. [facility] has no control over the quality or safety of the transportation that occurs as a result of the [employer]. 4. [facility] cannot guarantee that any third-party transportation provider will complete any arranged transportation service. 5. [facility] makes no representation, warranty, or guarantee regarding the reliability, timeliness, quality, safety, suitability, or availability of any of the Transport Services or that they will be error free. 6. I fully understand that traveling by vehicle involves risks and dangers of serious bodily injury, including permanent disability, paralysis, and death. I agree, on behalf of myself and on behalf of any minor child using the Transport Services for whom I am the parent or legal guardian, that the entire risk arising out of my use of the [employer] remains solely with me, to the maximum extent permitted under applicable law. 7. The [employer] are provided "as is" and "as available." [facility] disclaims all representations and warranties, express, implied or statutory, not expressly set out in these terms, including the implied warranties of merchantability and fitness for a particular purpose. 8. I hereby waive and release [facility], its agents, employees, officers, directors, representatives, insurers, attorneys, assigns, successors, subsidiaries, and affiliates from any and all past, present, or future claims, demands, liabilities, actions, causes of action, or suits of any kind directly or indirectly arising from acceptance and use of the [employer]. 9. I further waive and release [facility] and its affiliates from all present and future liability and responsibility for any injury or death to persons or damages to property caused by or related to the use of the [employer]. 10. I have read this Waiver and Release of Liability, and I understand the terms used in it and their legal significance. This Waiver is freely and voluntarily given with the understanding that my right (as well as the right of any minor child for whom I am the parent or legal guardian using the [employer]) to legal recourse against [facility] in connection with the [employer] is knowingly surrendered in return for use of these services.   I attest that I read the consent document to Horald Pollen, gave Ms. Mangold the opportunity to ask questions and answered the questions asked (if any). I affirm that Horald Pollen then provided consent for she's participation in this program.     Legrand Pitts

## 2020-12-30 ENCOUNTER — Ambulatory Visit: Payer: Medicare (Managed Care) | Attending: Registered Nurse

## 2020-12-30 ENCOUNTER — Other Ambulatory Visit: Payer: Self-pay

## 2020-12-30 DIAGNOSIS — R262 Difficulty in walking, not elsewhere classified: Secondary | ICD-10-CM | POA: Diagnosis present

## 2020-12-30 DIAGNOSIS — M6281 Muscle weakness (generalized): Secondary | ICD-10-CM | POA: Insufficient documentation

## 2020-12-30 DIAGNOSIS — R296 Repeated falls: Secondary | ICD-10-CM

## 2020-12-30 NOTE — Therapy (Addendum)
[facility] [address] [place], [place], [address] Phone: [phone]   Fax:  [phone]  Physical Therapy Evaluation/Discharge  Patient Details  Name: Deanna Petty MRN: [id number] Date of Birth: 11/27/1945 Referring Provider (PT): Nguyen, Kim, NP   Encounter Date: 12/30/2020   PT End of Session - 12/30/20 1127    Visit Number 1    Number of Visits 13    Date for PT Re-Evaluation 02/13/21    Authorization Type [employer] MCR - FOTO at visit 6 and 10; PN visit 10; KX modifier visit 15    PT Start Time 1130    PT Stop Time 1217    PT Time Calculation (min) 47 min    Activity Tolerance Patient tolerated treatment well    Behavior During Therapy WFL for tasks assessed/performed           Past Medical History:  Diagnosis Date  . Bronchitis   . Cigarette smoker   . Pneumonia   . Shortness of breath    with exertion; lasted used inhaler 06/02/13    Past Surgical History:  Procedure Laterality Date  . ABDOMINAL HYSTERECTOMY    . BREAST LUMPECTOMY WITH NEEDLE LOCALIZATION Left 06/12/2013   Procedure: LEFT BREAST LUMPECTOMY WITH NEEDLE LOCALIZATION;  Surgeon: Matthew K. Tsuei, MD;  Location: [facility] OR;  Service: General;  Laterality: Left;  . BREAST SURGERY      There were no vitals filed for this visit.    Subjective Assessment - 12/30/20 1129    Subjective "It's just like my legs give way, and I go down. My legs hurt recently for about 6 months (across entire leg bilaterally). After I get up and move around some, it (pain) goes away. This thing here has helped a lot (quad cane)."    Pertinent History See PMH above    Limitations Standing;Lifting;Walking;House hold activities    How long can you sit comfortably? Denies limitation    How long can you stand comfortably? "just standing, my back hurts." takes seated breaks between household tasks    How long can you walk comfortably? "going to the grocery store, it usually  gives out. I usually go with my son, and he will get stuff if I need to sit. sometimes there's no where to sit at the store."    Patient Stated Goals "I'd like to get to where I can walk."    Currently in Pain? No/denies    Pain Score 0-No pain    Pain Onset More than a month ago    Pain Frequency Occasional    Aggravating Factors  pain upon standing and initial walking after prolonged sitting/lying    Pain Relieving Factors "sometimes I take ibuprofen." pain is usually relieved with activity    Effect of Pain on Daily Activities difficulty sweeping and mopping ("have to sit down a lot doing that") and difficulty walking, especially prolonged time periods. Reports that limitations are often due to back pain              [facility] PT Assessment - 12/30/20 0001      Assessment   Medical Diagnosis Unsteadiness on feet (R26.81), Repeated falls (R29.6)    Referring Provider (PT) Nguyen, Kim, NP    Onset Date/Surgical Date --   more frequent falls over 2-3 months   Hand Dominance Right    Next MD Visit "I don't know"    Prior Therapy Yes around 2001-2002      Precautions     Precautions None      Restrictions   Weight Bearing Restrictions No      Balance Screen   Has the patient fallen in the past 6 months Yes    How many times? 3   "I went to get up, and I just went down. The second one - I was going in the house, and I just fell. The third one, I was standing and just went down."   Has the patient had a decrease in activity level because of a fear of falling?  Yes    Is the patient reluctant to leave their home because of a fear of falling?  Yes      Home Environment   Living Environment Private residence    Living Arrangements Other relatives   cousin and cousin's husband   Available Help at Discharge Family    Type of Home House    Home Access Level entry    Home Layout One level    Home Equipment Cane - quad   has quad cane and has access to walker if needed     Prior Function    Level of Independence Independent with basic ADLs    Vocation Retired    Leisure Read      Cognition   Overall Cognitive Status Within Functional Limits for tasks assessed      Observation/Other Assessments   Focus on Therapeutic Outcomes (FOTO)  45% function; predicted 55% function      ROM / Strength   AROM / PROM / Strength AROM;Strength      AROM   Overall AROM Comments BLE AROM WFL. Lumbar AROM limited but pt reports she limits herself further secondary to fear of falling    AROM Assessment Site Lumbar    Lumbar Flexion L hand fingertips to midthigh with R hand using quad cane for support    Lumbar Extension to upright standing posture - pt stands in slight trunk flexion    Lumbar - Right Side Bend fingertips to mid thigh    Lumbar - Left Side Bend fingertips to mid thigh    Lumbar - Right Rotation significant limitation with patient moving arms but avoiding trunk rotation    Lumbar - Left Rotation significant limitation with patient moving arms but avoiding trunk rotation      Strength   Strength Assessment Site Hip;Knee;Ankle    Right/Left Hip Right;Left    Right Hip Flexion 4+/5    Right Hip Extension 4/5    Right Hip ABduction 4/5    Right Hip ADduction 4+/5    Left Hip Flexion 4+/5    Left Hip Extension 4/5    Left Hip ABduction 4/5    Left Hip ADduction 4+/5    Right/Left Knee Right;Left    Right Knee Flexion 4+/5    Right Knee Extension 4+/5    Left Knee Flexion 4+/5    Left Knee Extension 4+/5    Right/Left Ankle Right;Left    Right Ankle Dorsiflexion 5/5    Right Ankle Plantar Flexion 5/5   modified test in sitting   Left Ankle Dorsiflexion 5/5    Left Ankle Plantar Flexion 5/5   modified test in sitting     Standardized Balance Assessment   Standardized Balance Assessment Timed Up and Go Test      Timed Up and Go Test   Normal TUG (seconds) 20.59    TUG Comments quad cane in RUE      High Level Balance     High Level Balance Comments Patient able  to stand with narrow BOS EO and EC without LOB but demonstrates some unsteadiness. Able to briefly assume tandem stance position but requires UE support. Unable to perform SLS at this time with either LE.                      Objective measurements completed on examination: See above findings.       [place] Adult PT Treatment/Exercise - 12/30/20 0001      Transfers   Five time sit to stand comments  21.73 seconds with RUE support on quad cane and LUE support on L armrest of chair      Self-Care   Self-Care Other Self-Care Comments    Other Self-Care Comments  See patient education      Exercises   Exercises Knee/Hip      Knee/Hip Exercises: Seated   Long Arc Quad Strengthening;Both;5 reps    Other Seated Knee/Hip Exercises seated clam with red band x 10    Sit to Sand 5 reps;with UE support                  PT Education - 12/30/20 1128    Education Details Reviewed initial HEP, anatomy of condition, safety with household and community ambulation, POC    Person(s) Educated Patient    Methods Explanation;Demonstration;Tactile cues;Verbal cues;Handout    Comprehension Verbalized understanding;Returned demonstration;Verbal cues required;Tactile cues required;Need further instruction            PT Short Term Goals - 12/30/20 1422      PT SHORT TERM GOAL #1   Title Patient will be independent with initial HEP.    Baseline Provided initial HEP at evaluation 12/30/2020    Time 3    Period Weeks    Status New    Target Date 01/20/21      PT SHORT TERM GOAL #2   Title Patient will perform TUG using quad cane (in RUE) or LRAD in </= 18 seconds for progress towards safer household and community ambulation.    Baseline 20.59 seconds    Time 3    Period Weeks    Status New    Target Date 01/20/21      PT SHORT TERM GOAL #3   Title Patient will perform 5xSTS from chair with use of 1 UE for support in </= 20 seconds.    Baseline 21.73 seconds    Time 3     Period Weeks    Status New    Target Date 01/20/21      PT SHORT TERM GOAL #4   Title Patient will be able to report no falls or near misses for at least 2 consecutive weeks.    Baseline 3 falls in the past 2-3 months due to legs giving out per patient    Time 3    Period Weeks    Status New    Target Date 01/20/21             PT Long Term Goals - 12/30/20 1426      PT LONG TERM GOAL #1   Title Patient will be independent with advanced HEP.    Baseline Provided initial HEP at evaluation 12/30/2020    Time 6    Period Weeks    Status New    Target Date 02/10/21      PT LONG TERM GOAL #2   Title Patient will perform TUG using quad  cane (in RUE) or LRAD in </= 15 seconds for progress towards safer household and community ambulation.    Baseline 20.59 seconds    Time 6    Period Weeks    Status New    Target Date 02/10/21      PT LONG TERM GOAL #3   Title Patient will perform 5xSTS from chair with use of 1 UE for support in </= 18 seconds.    Baseline 21.73 seconds    Time 6    Period Weeks    Status New    Target Date 02/10/21      PT LONG TERM GOAL #4   Title Patient's FOTO score will increase from 45% to 55% function to demonstrate improved perceived ability.    Baseline 45% function; predicted 55% function    Time 6    Period Weeks    Status New    Target Date 02/10/21      PT LONG TERM GOAL #5   Title Patient will improve BLE MMT to 5/5 grossly    Baseline See flowsheet    Time 6    Period Weeks    Status New    Target Date 02/10/21                  Plan - 12/30/20 1127    Clinical Impression Statement Patient is a 74 year old female who presents to OPPT with complaints of frequent falls. She expresses having activity limitations secondary to low back pain and was advised to contact physician for referral for PT to incorporate interventions for LBP specifically in addition to address balance and gait deficits. She ambulates using quad cane in RUE  that was adjusted to more appropriate height during evaluation. Upon walking into therapy gym, she propels quad cane forward per every 4-5 steps taken. She was able to perform TUG using quad cane in 20.59 seconds and 5xSTS (from chair) with BUE support in 21.73 seconds. Pt unable to perform single leg stance with either leg and unable to maintain tandem stance without UE support. She should benefit from skilled PT intervention to address deficits and improve safety with household/community ambulation and functional tasks.    Personal Factors and Comorbidities Comorbidity 3+;Age;Fitness;Past/Current Experience;Time since onset of injury/illness/exacerbation    Comorbidities See PMH above    Examination-Activity Limitations Carry;Lift;Locomotion Level;Bend;Squat;Stairs;Stand    Examination-Participation Restrictions Cleaning;Community Activity;Laundry;Meal Prep;Shop    Stability/Clinical Decision Making Stable/Uncomplicated    Clinical Decision Making Low    Rehab Potential Good    PT Frequency 2x / week    PT Duration 6 weeks    PT Treatment/Interventions ADLs/Self Care Home Management;Gait training;Functional mobility training;Stair training;Therapeutic activities;Therapeutic exercise;Balance training;Neuromuscular re-education;Manual techniques;Passive range of motion;Dry needling;Energy conservation;Taping    PT Next Visit Plan Assess response to initial HEP. Consider 6MWT, gait training, progress LE and core strength as tolerated. Initiate balance activities.    PT Home Exercise Plan [id number] - sit to stand (from chair at home), LAQ, seated clams/hip ABD with red theraband    Recommended Other Services advised to contact physician regarding referral for chronic low back pain as patient expresses having limitations with walking and activities due to her LBP in addition to unsteadiness    Consulted and Agree with Plan of Care Patient           Patient will benefit from skilled therapeutic  intervention in order to improve the following deficits and impairments:  Decreased activity tolerance,Decreased balance,Decreased mobility,Decreased strength,Decreased endurance,Difficulty walking,Decreased   range of motion,Improper body mechanics,Postural dysfunction,Pain  Visit Diagnosis: Repeated falls  Muscle weakness (generalized)  Difficulty in walking, not elsewhere classified     Problem List Patient Active Problem List   Diagnosis Date Noted  . Type 2 diabetes mellitus without complication ([place]) 50/93/2671  . Chronic diastolic CHF (congestive heart failure) ([place]) 07/07/2020  . Polycythemia, secondary 07/07/2020  . Acute hypoxemic respiratory failure ([place]) 07/03/2020  . Tobacco use 07/03/2020  . Elevated brain natriuretic peptide (BNP) level 07/03/2020  . Abnormal EKG 06/07/2013  . Left breast mass 05/14/2013     PHYSICAL THERAPY DISCHARGE SUMMARY  Visits from Start of Care: 1  Current functional level related to goals / functional outcomes: See above   Remaining deficits: See above   Education / Equipment: See above  Plan:                                                    Patient goals were not met. Patient is being discharged due to a change in medical status.  ?????    Patient sustained hip fracture/change in medical status.  Deanna Petty, PT, DPT 01/13/21 2:02 PM  [facility] [address] [place], [place], [address] Phone: [phone]   Fax:  [phone]  Name: Deanna Petty MRN: [id number] Date of Birth: 1946-06-22

## 2021-01-05 ENCOUNTER — Telehealth: Payer: Self-pay

## 2021-01-05 ENCOUNTER — Ambulatory Visit: Payer: Medicare (Managed Care) | Admitting: Physical Therapy

## 2021-01-05 NOTE — Telephone Encounter (Signed)
PT called and left voicemail to check on patient's status and to inform her that she will be discharged from outpatient physical therapy at this time due to change in medical status.  Haydee Monica, PT, DPT 01/05/21 1:35 PM

## 2021-01-13 ENCOUNTER — Encounter: Payer: Medicare (Managed Care) | Admitting: Rehabilitative and Restorative Service Providers"

## 2021-01-14 ENCOUNTER — Ambulatory Visit: Payer: Medicare (Managed Care) | Admitting: Physical Therapy

## 2021-01-15 ENCOUNTER — Encounter: Payer: Medicare (Managed Care) | Admitting: Physical Therapy

## 2021-01-19 ENCOUNTER — Encounter: Payer: Medicare (Managed Care) | Admitting: Physical Therapy

## 2021-02-03 ENCOUNTER — Encounter: Payer: Medicare (Managed Care) | Admitting: Physical Therapy

## 2021-02-05 ENCOUNTER — Encounter: Payer: Medicare (Managed Care) | Admitting: Physical Therapy

## 2021-02-08 ENCOUNTER — Encounter: Payer: Medicare (Managed Care) | Admitting: Physical Therapy

## 2021-02-10 ENCOUNTER — Encounter: Payer: Medicare (Managed Care) | Admitting: Physical Therapy

## 2021-09-17 ENCOUNTER — Ambulatory Visit
Admission: EM | Admit: 2021-09-17 | Discharge: 2021-09-17 | Disposition: A | Discharge status: Other Acute Inpatient Hospital | Payer: Medicare (Managed Care)

## 2021-09-17 ENCOUNTER — Inpatient Hospital Stay (HOSPITAL_COMMUNITY)
Admission: EM | Admit: 2021-09-17 | Discharge: 2021-09-21 | DRG: 190 | Disposition: A | Discharge status: Home / Self Care | Payer: Medicare (Managed Care) | Attending: Internal Medicine | Admitting: Internal Medicine

## 2021-09-17 ENCOUNTER — Encounter (HOSPITAL_COMMUNITY): Payer: Self-pay

## 2021-09-17 ENCOUNTER — Other Ambulatory Visit: Payer: Self-pay

## 2021-09-17 DIAGNOSIS — E119 Type 2 diabetes mellitus without complications: Secondary | ICD-10-CM

## 2021-09-17 DIAGNOSIS — R5383 Other fatigue: Secondary | ICD-10-CM | POA: Diagnosis not present

## 2021-09-17 DIAGNOSIS — R7981 Abnormal blood-gas level: Secondary | ICD-10-CM | POA: Diagnosis not present

## 2021-09-17 DIAGNOSIS — J441 Chronic obstructive pulmonary disease with (acute) exacerbation: Secondary | ICD-10-CM | POA: Diagnosis not present

## 2021-09-17 DIAGNOSIS — Z833 Family history of diabetes mellitus: Secondary | ICD-10-CM

## 2021-09-17 DIAGNOSIS — I700 Atherosclerosis of aorta: Secondary | ICD-10-CM | POA: Diagnosis present

## 2021-09-17 DIAGNOSIS — D751 Secondary polycythemia: Secondary | ICD-10-CM | POA: Diagnosis present

## 2021-09-17 DIAGNOSIS — Z90710 Acquired absence of both cervix and uterus: Secondary | ICD-10-CM

## 2021-09-17 DIAGNOSIS — R8271 Bacteriuria: Secondary | ICD-10-CM | POA: Diagnosis present

## 2021-09-17 DIAGNOSIS — I248 Other forms of acute ischemic heart disease: Secondary | ICD-10-CM | POA: Diagnosis present

## 2021-09-17 DIAGNOSIS — J9621 Acute and chronic respiratory failure with hypoxia: Secondary | ICD-10-CM | POA: Diagnosis present

## 2021-09-17 DIAGNOSIS — E785 Hyperlipidemia, unspecified: Secondary | ICD-10-CM

## 2021-09-17 DIAGNOSIS — J9601 Acute respiratory failure with hypoxia: Principal | ICD-10-CM

## 2021-09-17 DIAGNOSIS — Y92239 Unspecified place in hospital as the place of occurrence of the external cause: Secondary | ICD-10-CM | POA: Diagnosis not present

## 2021-09-17 DIAGNOSIS — F17210 Nicotine dependence, cigarettes, uncomplicated: Secondary | ICD-10-CM | POA: Diagnosis present

## 2021-09-17 DIAGNOSIS — R7881 Bacteremia: Secondary | ICD-10-CM | POA: Diagnosis present

## 2021-09-17 DIAGNOSIS — E1165 Type 2 diabetes mellitus with hyperglycemia: Secondary | ICD-10-CM | POA: Diagnosis not present

## 2021-09-17 DIAGNOSIS — I5032 Chronic diastolic (congestive) heart failure: Secondary | ICD-10-CM | POA: Diagnosis present

## 2021-09-17 DIAGNOSIS — J449 Chronic obstructive pulmonary disease, unspecified: Secondary | ICD-10-CM | POA: Diagnosis present

## 2021-09-17 DIAGNOSIS — Z20822 Contact with and (suspected) exposure to covid-19: Secondary | ICD-10-CM | POA: Diagnosis present

## 2021-09-17 DIAGNOSIS — I5033 Acute on chronic diastolic (congestive) heart failure: Secondary | ICD-10-CM | POA: Diagnosis present

## 2021-09-17 DIAGNOSIS — Z720 Tobacco use: Secondary | ICD-10-CM | POA: Diagnosis present

## 2021-09-17 DIAGNOSIS — Z716 Tobacco abuse counseling: Secondary | ICD-10-CM

## 2021-09-17 DIAGNOSIS — J9622 Acute and chronic respiratory failure with hypercapnia: Secondary | ICD-10-CM | POA: Diagnosis present

## 2021-09-17 DIAGNOSIS — T380X5A Adverse effect of glucocorticoids and synthetic analogues, initial encounter: Secondary | ICD-10-CM | POA: Diagnosis not present

## 2021-09-17 DIAGNOSIS — E1169 Type 2 diabetes mellitus with other specified complication: Secondary | ICD-10-CM

## 2021-09-17 HISTORY — DX: Chronic obstructive pulmonary disease, unspecified: J44.9

## 2021-09-17 NOTE — ED Triage Notes (Signed)
Pt c/o fatigue, SOB onset x1 week ago. Friend of pt encouraged pt to be seen today. On triage pt o2 is 70%. Provider notified and non-rebreather applied. O2 98% on 15L non-rebreather.

## 2021-09-17 NOTE — ED Notes (Signed)
EMS present

## 2021-09-17 NOTE — ED Provider Notes (Signed)
Emergency Medicine Provider Triage Evaluation Note  Deanna Petty , a 75 y.o. female  was evaluated in triage.  Pt complains of shortness of breath and weakness  Review of Systems  Positive: cough Negative: fever  Physical Exam  BP (!) 150/64 (BP Location: Right Arm)   Pulse 78   Temp 99.5 F (37.5 C) (Oral)   Resp 16   SpO2 94%  Gen:   Awake, no distress   Resp:  Normal effort  MSK:   Moves extremities without difficulty  Other:    Medical Decision Making  Medically screening exam initiated at 6:03 PM.  Appropriate orders placed.  Deanna Petty was informed that the remainder of the evaluation will be completed by another provider, this initial triage assessment does not replace that evaluation, and the importance of remaining in the ED until their evaluation is complete.     Fransico Meadow, [place] 09/17/21 1804    Tegeler, Gwenyth Allegra, MD 09/17/21 [phone]

## 2021-09-17 NOTE — Discharge Instructions (Signed)
Patient sent to the hospital via EMS. 

## 2021-09-17 NOTE — ED Notes (Signed)
Pt left via stretcher w/ ems

## 2021-09-17 NOTE — ED Notes (Signed)
EMS contacted

## 2021-09-17 NOTE — ED Notes (Signed)
Pt's oxygen tank changed out by Burnis Medin, RN.

## 2021-09-17 NOTE — ED Provider Notes (Signed)
[facility]    CSN: [id number] Arrival date & time: 09/17/21  1620      History   Chief Complaint Chief Complaint  Patient presents with   Fatigue    HPI Deanna Petty is a 75 y.o. female.   Patient presents for 1 week history of fatigue and shortness of breath.  Denies any cough, upper respiratory symptoms, fever.  Has any known sick contacts.  Patient reports that she does have history of COPD but does not wear any oxygen.  Patient also smokes cigarettes daily.    Past Medical History:  Diagnosis Date   Bronchitis    Cigarette smoker    Pneumonia    Shortness of breath    with exertion; lasted used inhaler 06/02/13    Patient Active Problem List   Diagnosis Date Noted   Type 2 diabetes mellitus without complication ([place]) 00/93/8182   Chronic diastolic CHF (congestive heart failure) ([place]) 07/07/2020   Polycythemia, secondary 07/07/2020   Acute hypoxemic respiratory failure ([place]) 07/03/2020   Tobacco use 07/03/2020   Elevated brain natriuretic peptide (BNP) level 07/03/2020   Abnormal EKG 06/07/2013   Left breast mass 05/14/2013    Past Surgical History:  Procedure Laterality Date   ABDOMINAL HYSTERECTOMY     BREAST LUMPECTOMY WITH NEEDLE LOCALIZATION Left 06/12/2013   Procedure: LEFT BREAST LUMPECTOMY WITH NEEDLE LOCALIZATION;  Surgeon: Imogene Burn. Georgette Dover, MD;  Location: [place] OR;  Service: General;  Laterality: Left;   BREAST SURGERY      OB History   No obstetric history on file.      Home Medications    Prior to Admission medications   Medication Sig Start Date End Date Taking? Authorizing Provider  albuterol (VENTOLIN HFA) 108 (90 Base) MCG/ACT inhaler Inhale 2 puffs into the lungs every 6 (six) hours as needed for wheezing or shortness of breath. 07/07/20   Guilford Shi, MD  cholecalciferol (VITAMIN D3) 25 MCG (1000 UNIT) tablet Take 1,000 Units by mouth daily.    [provider]  ibuprofen (ADVIL,MOTRIN) 200 MG tablet Take 400  mg by mouth every 6 (six) hours as needed for pain.    [provider]  metFORMIN (GLUCOPHAGE) 500 MG tablet Take 1 tablet (500 mg total) by mouth 2 (two) times daily with a meal. 07/07/20 07/07/21  Guilford Shi, MD  nicotine (NICODERM CQ - DOSED IN MG/24 HOURS) 21 mg/24hr patch Place 1 patch (21 mg total) onto the skin daily. Patient not taking: Reported on 12/30/2020 08/05/20   Marshell Garfinkel, MD  nicotine polacrilex (NICORETTE) 2 MG gum Take 1 each (2 mg total) by mouth as needed for smoking cessation. Patient not taking: Reported on 12/30/2020 07/07/20   Guilford Shi, MD  Omega-3 1000 MG CAPS Take 1 capsule by mouth daily.    [provider]  polyethylene glycol (MIRALAX / GLYCOLAX) 17 g packet Take 17 g by mouth daily as needed for mild constipation. Patient not taking: Reported on 12/30/2020 07/07/20   Guilford Shi, MD  umeclidinium-vilanterol (ANORO ELLIPTA) 62.5-25 MCG/INH AEPB Inhale 1 puff into the lungs daily. 07/07/20   Guilford Shi, MD    Family History Family History  Problem Relation Age of Onset   Cancer Mother        oral, brain   Diabetes Father     Social History Social History   Tobacco Use   Smoking status: Former    Packs/day: 0.25    Years: 40.00    Pack years: 10.00  Types: Cigarettes    Quit date: 07/03/2020    Years since quitting: 1.2   Smokeless tobacco: Never  Vaping Use   Vaping Use: Never used  Substance Use Topics   Alcohol use: Yes    Comment: rare   Drug use: No     Allergies   Patient has no known allergies.   Review of Systems Review of Systems Per HPI  Physical Exam Triage Vital Signs ED Triage Vitals [09/17/21 1639]  Enc Vitals Group     BP (!) 149/71     Pulse Rate 98     Resp 18     Temp 97.7 F (36.5 C)     Temp Source Oral     SpO2 (!) 70 %     Weight      Height      Head Circumference      Peak Flow      Pain Score 0     Pain Loc      Pain Edu?      Excl. in [place]?    No data  found.  Updated Vital Signs BP (!) 149/71 (BP Location: Left Arm)   Pulse 98   Temp 97.7 F (36.5 C) (Oral)   Resp 18   SpO2 (!) 70%   Visual Acuity Right Eye Distance:   Left Eye Distance:   Bilateral Distance:    Right Eye Near:   Left Eye Near:    Bilateral Near:     Physical Exam Constitutional:      General: She is not in acute distress.    Appearance: Normal appearance. She is ill-appearing and toxic-appearing. She is not diaphoretic.  HENT:     Head: Normocephalic and atraumatic.  Eyes:     Extraocular Movements: Extraocular movements intact.     Conjunctiva/sclera: Conjunctivae normal.  Cardiovascular:     Rate and Rhythm: Normal rate and regular rhythm.     Pulses: Normal pulses.     Heart sounds: Normal heart sounds.  Pulmonary:     Effort: Pulmonary effort is normal. No respiratory distress.     Breath sounds: No stridor. Wheezing present. No rhonchi or rales.  Neurological:     General: No focal deficit present.     Mental Status: She is alert and oriented to person, place, and time. Mental status is at baseline.  Psychiatric:        Mood and Affect: Mood normal.        Behavior: Behavior normal.        Thought Content: Thought content normal.        Judgment: Judgment normal.     UC Treatments / Results  Labs (all labs ordered are listed, but only abnormal results are displayed) Labs Reviewed - No data to display  EKG   Radiology No results found.  Procedures Procedures (including critical care time)  Medications Ordered in UC Medications - No data to display  Initial Impression / Assessment and Plan / UC Course  I have reviewed the triage vital signs and the nursing notes.  Pertinent labs & imaging results that were available during my care of the patient were reviewed by me and considered in my medical decision making (see chart for details).     Oxygen saturation was 70% on initial triage.  Nonrebreather mask applied to patient  with improvement of oxygen to the 90s.  EMS was called to transport patient to the hospital.  Patient was agreeable with plan and  left via EMS. Final Clinical Impressions(s) / UC Diagnoses   Final diagnoses:  Low oxygen saturation  Other fatigue     Discharge Instructions      Patient sent to the hospital via EMS.     ED Prescriptions   None    PDMP not reviewed this encounter.   Teodora Medici, [place] 09/17/21 [phone]

## 2021-09-17 NOTE — ED Triage Notes (Incomplete)
Per EMS- patient was at an UC today with c/o generalized weakness. UC called EMS and reported that there sats were in the 70's but they could not get a good waveform Patient denies SOB. Patient has a history of COPD.

## 2021-09-18 ENCOUNTER — Emergency Department (HOSPITAL_COMMUNITY): Payer: Medicare (Managed Care)

## 2021-09-18 ENCOUNTER — Encounter (HOSPITAL_COMMUNITY): Payer: Self-pay

## 2021-09-18 DIAGNOSIS — E1165 Type 2 diabetes mellitus with hyperglycemia: Secondary | ICD-10-CM | POA: Diagnosis not present

## 2021-09-18 DIAGNOSIS — F17210 Nicotine dependence, cigarettes, uncomplicated: Secondary | ICD-10-CM | POA: Diagnosis present

## 2021-09-18 DIAGNOSIS — I700 Atherosclerosis of aorta: Secondary | ICD-10-CM | POA: Diagnosis present

## 2021-09-18 DIAGNOSIS — J9621 Acute and chronic respiratory failure with hypoxia: Secondary | ICD-10-CM | POA: Diagnosis present

## 2021-09-18 DIAGNOSIS — Z716 Tobacco abuse counseling: Secondary | ICD-10-CM | POA: Diagnosis not present

## 2021-09-18 DIAGNOSIS — J449 Chronic obstructive pulmonary disease, unspecified: Secondary | ICD-10-CM | POA: Diagnosis present

## 2021-09-18 DIAGNOSIS — J9622 Acute and chronic respiratory failure with hypercapnia: Secondary | ICD-10-CM | POA: Diagnosis present

## 2021-09-18 DIAGNOSIS — I517 Cardiomegaly: Secondary | ICD-10-CM | POA: Diagnosis not present

## 2021-09-18 DIAGNOSIS — I248 Other forms of acute ischemic heart disease: Secondary | ICD-10-CM | POA: Diagnosis present

## 2021-09-18 DIAGNOSIS — R8271 Bacteriuria: Secondary | ICD-10-CM | POA: Diagnosis present

## 2021-09-18 DIAGNOSIS — I5032 Chronic diastolic (congestive) heart failure: Secondary | ICD-10-CM | POA: Diagnosis present

## 2021-09-18 DIAGNOSIS — R7881 Bacteremia: Secondary | ICD-10-CM | POA: Diagnosis present

## 2021-09-18 DIAGNOSIS — D751 Secondary polycythemia: Secondary | ICD-10-CM | POA: Diagnosis present

## 2021-09-18 DIAGNOSIS — Z20822 Contact with and (suspected) exposure to covid-19: Secondary | ICD-10-CM | POA: Diagnosis present

## 2021-09-18 DIAGNOSIS — Z90710 Acquired absence of both cervix and uterus: Secondary | ICD-10-CM | POA: Diagnosis not present

## 2021-09-18 DIAGNOSIS — J441 Chronic obstructive pulmonary disease with (acute) exacerbation: Secondary | ICD-10-CM | POA: Diagnosis present

## 2021-09-18 DIAGNOSIS — J9601 Acute respiratory failure with hypoxia: Secondary | ICD-10-CM | POA: Diagnosis present

## 2021-09-18 DIAGNOSIS — T380X5A Adverse effect of glucocorticoids and synthetic analogues, initial encounter: Secondary | ICD-10-CM | POA: Diagnosis not present

## 2021-09-18 DIAGNOSIS — Z833 Family history of diabetes mellitus: Secondary | ICD-10-CM | POA: Diagnosis not present

## 2021-09-18 DIAGNOSIS — Y92239 Unspecified place in hospital as the place of occurrence of the external cause: Secondary | ICD-10-CM | POA: Diagnosis not present

## 2021-09-18 LAB — CBC WITH DIFFERENTIAL/PLATELET
Abs Immature Granulocytes: 0.04 10*3/uL (ref 0.00–0.07)
Basophils Absolute: 0.1 10*3/uL (ref 0.0–0.1)
Basophils Relative: 1 %
Eosinophils Absolute: 0.1 10*3/uL (ref 0.0–0.5)
Eosinophils Relative: 2 %
HCT: 55 % — ABNORMAL HIGH (ref 36.0–46.0)
Hemoglobin: 16.8 g/dL — ABNORMAL HIGH (ref 12.0–15.0)
Immature Granulocytes: 1 %
Lymphocytes Relative: 22 %
Lymphs Abs: 2 10*3/uL (ref 0.7–4.0)
MCH: 30.4 pg (ref 26.0–34.0)
MCHC: 30.5 g/dL (ref 30.0–36.0)
MCV: 99.6 fL (ref 80.0–100.0)
Monocytes Absolute: 1.1 10*3/uL — ABNORMAL HIGH (ref 0.1–1.0)
Monocytes Relative: 13 %
Neutro Abs: 5.5 10*3/uL (ref 1.7–7.7)
Neutrophils Relative %: 61 %
Platelets: 184 10*3/uL (ref 150–400)
RBC: 5.52 MIL/uL — ABNORMAL HIGH (ref 3.87–5.11)
RDW: 14.1 % (ref 11.5–15.5)
WBC: 8.9 10*3/uL (ref 4.0–10.5)
nRBC: 0 % (ref 0.0–0.2)

## 2021-09-18 LAB — RESP PANEL BY RT-PCR (FLU A&B, COVID) ARPGX2
Influenza A by PCR: NEGATIVE
Influenza B by PCR: NEGATIVE
SARS Coronavirus 2 by RT PCR: NEGATIVE

## 2021-09-18 LAB — URINALYSIS, ROUTINE W REFLEX MICROSCOPIC
Bilirubin Urine: NEGATIVE
Glucose, UA: NEGATIVE mg/dL
Ketones, ur: NEGATIVE mg/dL
Leukocytes,Ua: NEGATIVE
Nitrite: POSITIVE — AB
Protein, ur: 30 mg/dL — AB
Specific Gravity, Urine: >1.046 — ABNORMAL HIGH (ref 1.005–1.030)
pH: 5 (ref 5.0–8.0)

## 2021-09-18 LAB — BASIC METABOLIC PANEL
Anion gap: 8 (ref 5–15)
BUN: 15 mg/dL (ref 8–23)
CO2: 36 mmol/L — ABNORMAL HIGH (ref 22–32)
Calcium: 8.9 mg/dL (ref 8.9–10.3)
Chloride: 93 mmol/L — ABNORMAL LOW (ref 98–111)
Creatinine, Ser: 0.66 mg/dL (ref 0.44–1.00)
GFR, Estimated: >60 mL/min (ref >60)
Glucose, Bld: 106 mg/dL — ABNORMAL HIGH (ref 70–99)
Potassium: 4.6 mmol/L (ref 3.5–5.1)
Sodium: 137 mmol/L (ref 135–145)

## 2021-09-18 LAB — TROPONIN I (HIGH SENSITIVITY)
Troponin I (High Sensitivity): 79 ng/L — ABNORMAL HIGH (ref <18)
Troponin I (High Sensitivity): 85 ng/L — ABNORMAL HIGH (ref <18)
Troponin I (High Sensitivity): 97 ng/L — ABNORMAL HIGH (ref <18)

## 2021-09-18 LAB — GLUCOSE, CAPILLARY
Glucose-Capillary: 143 mg/dL — ABNORMAL HIGH (ref 70–99)
Glucose-Capillary: 181 mg/dL — ABNORMAL HIGH (ref 70–99)

## 2021-09-18 LAB — HEMOGLOBIN A1C
Hgb A1c MFr Bld: 7.4 % — ABNORMAL HIGH (ref 4.8–5.6)
Mean Plasma Glucose: 165.68 mg/dL

## 2021-09-18 LAB — CBG MONITORING, ED: Glucose-Capillary: 162 mg/dL — ABNORMAL HIGH (ref 70–99)

## 2021-09-18 LAB — BRAIN NATRIURETIC PEPTIDE: B Natriuretic Peptide: 353.1 pg/mL — ABNORMAL HIGH (ref 0.0–100.0)

## 2021-09-18 MED ORDER — IOHEXOL 350 MG/ML SOLN
75.0000 mL | Freq: Once | INTRAVENOUS | Status: AC | PRN
  Administered 2021-09-18: 75 mL via INTRAVENOUS

## 2021-09-18 MED ORDER — IPRATROPIUM-ALBUTEROL 0.5-2.5 (3) MG/3ML IN SOLN
3.0000 mL | Freq: Once | RESPIRATORY_TRACT | Status: AC
  Administered 2021-09-18: 3 mL via RESPIRATORY_TRACT
  Filled 2021-09-18: qty 3

## 2021-09-18 MED ORDER — ACETAMINOPHEN 650 MG RE SUPP: 650.0000 mg | Freq: Four times a day (QID) | RECTAL | Status: DC | PRN

## 2021-09-18 MED ORDER — ORAL CARE MOUTH RINSE
15.0000 mL | Freq: Two times a day (BID) | OROMUCOSAL | Status: DC
  Administered 2021-09-18 – 2021-09-20 (×5): 15 mL via OROMUCOSAL

## 2021-09-18 MED ORDER — PREDNISONE 20 MG PO TABS
40.0000 mg | ORAL_TABLET | Freq: Every day | ORAL | Status: DC
  Administered 2021-09-19: 10:00:00 40 mg via ORAL
  Filled 2021-09-18: qty 2

## 2021-09-18 MED ORDER — SODIUM CHLORIDE 0.9 % IV SOLN
1.0000 g | Freq: Once | INTRAVENOUS | Status: AC
  Administered 2021-09-18: 1 g via INTRAVENOUS
  Filled 2021-09-18: qty 10

## 2021-09-18 MED ORDER — ONDANSETRON HCL 4 MG/2ML IJ SOLN: 4.0000 mg | Freq: Four times a day (QID) | INTRAMUSCULAR | Status: DC | PRN

## 2021-09-18 MED ORDER — IPRATROPIUM-ALBUTEROL 0.5-2.5 (3) MG/3ML IN SOLN
3.0000 mL | Freq: Four times a day (QID) | RESPIRATORY_TRACT | Status: DC
  Administered 2021-09-18: 3 mL via RESPIRATORY_TRACT
  Filled 2021-09-18: qty 3

## 2021-09-18 MED ORDER — INSULIN ASPART 100 UNIT/ML IJ SOLN
0.0000 [IU] | Freq: Three times a day (TID) | INTRAMUSCULAR | Status: DC
Start: 2021-09-18 — End: 2021-09-21
  Administered 2021-09-18: 2 [IU] via SUBCUTANEOUS
  Administered 2021-09-18 – 2021-09-19 (×2): 3 [IU] via SUBCUTANEOUS
  Administered 2021-09-19: 10:00:00 2 [IU] via SUBCUTANEOUS
  Administered 2021-09-20: 5 [IU] via SUBCUTANEOUS
  Administered 2021-09-20 – 2021-09-21 (×2): 3 [IU] via SUBCUTANEOUS
  Filled 2021-09-18: qty 0.15

## 2021-09-18 MED ORDER — ALBUTEROL SULFATE (2.5 MG/3ML) 0.083% IN NEBU: 2.5000 mg | INHALATION_SOLUTION | Freq: Four times a day (QID) | RESPIRATORY_TRACT | Status: DC

## 2021-09-18 MED ORDER — SODIUM CHLORIDE 0.9 % IV SOLN
500.0000 mg | Freq: Once | INTRAVENOUS | Status: AC
  Administered 2021-09-18: 500 mg via INTRAVENOUS
  Filled 2021-09-18: qty 500

## 2021-09-18 MED ORDER — METHYLPREDNISOLONE SODIUM SUCC 125 MG IJ SOLR
125.0000 mg | Freq: Once | INTRAMUSCULAR | Status: AC
  Administered 2021-09-18: 125 mg via INTRAVENOUS
  Filled 2021-09-18: qty 2

## 2021-09-18 MED ORDER — MAGNESIUM SULFATE 2 GM/50ML IV SOLN
2.0000 g | Freq: Once | INTRAVENOUS | Status: AC
  Administered 2021-09-18: 2 g via INTRAVENOUS
  Filled 2021-09-18: qty 50

## 2021-09-18 MED ORDER — METHYLPREDNISOLONE SODIUM SUCC 40 MG IJ SOLR
40.0000 mg | Freq: Two times a day (BID) | INTRAMUSCULAR | Status: AC
  Administered 2021-09-18: 40 mg via INTRAVENOUS
  Filled 2021-09-18: qty 1

## 2021-09-18 MED ORDER — ACETAMINOPHEN 325 MG PO TABS
650.0000 mg | ORAL_TABLET | Freq: Four times a day (QID) | ORAL | Status: DC | PRN
  Administered 2021-09-20: 650 mg via ORAL
  Filled 2021-09-18: qty 2

## 2021-09-18 MED ORDER — ENOXAPARIN SODIUM 40 MG/0.4ML IJ SOSY
40.0000 mg | PREFILLED_SYRINGE | INTRAMUSCULAR | Status: DC
  Administered 2021-09-18 – 2021-09-21 (×4): 40 mg via SUBCUTANEOUS
  Filled 2021-09-18 (×4): qty 0.4

## 2021-09-18 MED ORDER — ALBUTEROL SULFATE (2.5 MG/3ML) 0.083% IN NEBU: 2.5000 mg | INHALATION_SOLUTION | RESPIRATORY_TRACT | Status: DC | PRN

## 2021-09-18 MED ORDER — ONDANSETRON HCL 4 MG PO TABS: 4.0000 mg | ORAL_TABLET | Freq: Four times a day (QID) | ORAL | Status: DC | PRN

## 2021-09-18 MED ORDER — IPRATROPIUM BROMIDE 0.02 % IN SOLN: 0.5000 mg | Freq: Four times a day (QID) | RESPIRATORY_TRACT | Status: DC

## 2021-09-18 NOTE — ED Notes (Signed)
Patient transported to X-ray 

## 2021-09-18 NOTE — ED Provider Notes (Signed)
[facility] DEPT Provider Note   CSN: [id number] Arrival date & time: 09/17/21  1738     History Chief Complaint  Patient presents with   generalized weakness    Deanna Petty is a 75 y.o. female.  Patient with a history of COPD and tobacco abuse presenting with feeling poorly for the past 1 week.  She describes generalized weakness, fatigue, shortness of breath, dry cough.  Denies any runny nose, sore throat, fever, chest pain, abdominal pain, nausea, vomiting or diarrhea.  She was seen in urgent care today to "see if I have COVID" where she was noted to be hypoxic in the 70s and sent to the ED.  She feels improved now with oxygen.  Does not wear oxygen at home.  Reports she takes albuterol for her breathing on a regular basis.  Her cough is nonproductive.  This she states that she continues to smoke.  But denies any history of blood clots and is not anticoagulated.  Denies any cardiac history and has no chest pain.  The history is provided by the patient.      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 Georgette Dover, MD;  Location: [facility];  Service:  General;  Laterality: Left;   BREAST SURGERY     HIP FRACTURE SURGERY Right      OB History   No obstetric history on file.     Family History  Problem Relation Age of Onset   Cancer Mother        oral, brain   Diabetes Father     Social History   Tobacco Use   Smoking status: Every Day    Packs/day: 0.25    Years: 40.00    Pack years: 10.00    Types: Cigarettes   Smokeless tobacco: Never  Vaping Use   Vaping Use: Never used  Substance Use Topics   Alcohol use: Yes    Comment: rare   Drug use: No    Home Medications Prior to Admission medications   Medication Sig Start Date End Date Taking? Authorizing Provider  albuterol (VENTOLIN HFA) 108 (90 Base) MCG/ACT inhaler Inhale 2 puffs into the lungs every 6 (six) hours as needed for wheezing or shortness of breath. 07/07/20   Guilford Shi, MD  cholecalciferol (VITAMIN D3) 25 MCG (1000 UNIT) tablet Take 1,000 Units by mouth daily.    [provider]  ibuprofen (ADVIL,MOTRIN) 200 MG tablet Take 400 mg by mouth every 6 (six) hours as needed for pain.    [provider]  metFORMIN (GLUCOPHAGE) 500 MG tablet Take 1 tablet (500 mg total) by mouth 2 (two) times daily with  a meal. 07/07/20 07/07/21  Guilford Shi, MD  nicotine (NICODERM CQ - DOSED IN MG/24 HOURS) 21 mg/24hr patch Place 1 patch (21 mg total) onto the skin daily. Patient not taking: Reported on 12/30/2020 08/05/20   Marshell Garfinkel, MD  nicotine polacrilex (NICORETTE) 2 MG gum Take 1 each (2 mg total) by mouth as needed for smoking cessation. Patient not taking: Reported on 12/30/2020 07/07/20   Guilford Shi, MD  Omega-3 1000 MG CAPS Take 1 capsule by mouth daily.    [provider]  polyethylene glycol (MIRALAX / GLYCOLAX) 17 g packet Take 17 g by mouth daily as needed for mild constipation. Patient not taking: Reported on 12/30/2020 07/07/20   Guilford Shi, MD  umeclidinium-vilanterol (ANORO ELLIPTA) 62.5-25 MCG/INH AEPB  Inhale 1 puff into the lungs daily. 07/07/20   Guilford Shi, MD    Allergies    Patient has no known allergies.  Review of Systems   Review of Systems  Constitutional:  Positive for chills and fatigue. Negative for activity change, appetite change and fever.  HENT:  Positive for congestion, rhinorrhea and sore throat.   Respiratory:  Positive for cough and shortness of breath.   Cardiovascular:  Negative for chest pain.  Gastrointestinal:  Negative for abdominal pain, nausea and vomiting.  Genitourinary:  Negative for dysuria and hematuria.  Musculoskeletal:  Positive for arthralgias and myalgias.  Neurological:  Positive for weakness. Negative for dizziness and headaches.   all other systems are negative except as noted in the HPI and PMH.   Physical Exam Updated Vital Signs BP (!) 172/52   Pulse 71   Temp 98.8 F (37.1 C) (Oral)   Resp (!) 23   Ht 5\' 2"  (1.575 m)   Wt 81.6 kg   SpO2 93%   BMI 32.92 kg/m   Physical Exam Vitals and nursing note reviewed.  Constitutional:      General: She is not in acute distress.    Appearance: She is well-developed. She is ill-appearing.  HENT:     Head: Normocephalic and atraumatic.     Mouth/Throat:     Pharynx: No oropharyngeal exudate.  Eyes:     Conjunctiva/sclera: Conjunctivae normal.     Pupils: Pupils are equal, round, and reactive to light.  Neck:     Comments: No meningismus. Cardiovascular:     Rate and Rhythm: Normal rate and regular rhythm.     Heart sounds: Normal heart sounds. No murmur heard. Pulmonary:     Effort: Respiratory distress present.     Breath sounds: Wheezing present.     Comments: Dyspnea with conversation, diminished breath sounds with faint expiratory wheezing Abdominal:     Palpations: Abdomen is soft.     Tenderness: There is no abdominal tenderness. There is no guarding or rebound.  Musculoskeletal:        General: No tenderness. Normal range of motion.     Cervical back: Normal range  of motion and neck supple.  Skin:    General: Skin is warm.  Neurological:     Mental Status: She is alert and oriented to person, place, and time.     Cranial Nerves: No cranial nerve deficit.     Motor: No abnormal muscle tone.     Coordination: Coordination normal.     Comments:  5/5 strength throughout. CN 2-12 intact.Equal grip strength.   Psychiatric:        Behavior: Behavior normal.    ED Results / Procedures / Treatments  Labs (all labs ordered are listed, but only abnormal results are displayed) Labs Reviewed  CBC WITH DIFFERENTIAL/PLATELET - Abnormal; Notable for the following components:      Result Value   RBC 5.52 (*)    Hemoglobin 16.8 (*)    HCT 55.0 (*)    Monocytes Absolute 1.1 (*)    All other components within normal limits  BASIC METABOLIC PANEL - Abnormal; Notable for the following components:   Chloride 93 (*)    CO2 36 (*)    Glucose, Bld 106 (*)    All other components within normal limits  BRAIN NATRIURETIC PEPTIDE - Abnormal; Notable for the following components:   B Natriuretic Peptide 353.1 (*)    All other components within normal limits  TROPONIN I (HIGH SENSITIVITY) - Abnormal; Notable for the following components:   Troponin I (High Sensitivity) 79 (*)    All other components within normal limits  TROPONIN I (HIGH SENSITIVITY) - Abnormal; Notable for the following components:   Troponin I (High Sensitivity) 85 (*)    All other components within normal limits  RESP PANEL BY RT-PCR (FLU A&B, COVID) ARPGX2  URINALYSIS, ROUTINE W REFLEX MICROSCOPIC  TROPONIN I (HIGH SENSITIVITY)    EKG EKG Interpretation  Date/Time:  Friday September 17 2021 17:55:40 EST Ventricular Rate:  78 PR Interval:  140 QRS Duration: 87 QT Interval:  356 QTC Calculation: 406 R Axis:   77 Text Interpretation: Sinus rhythm Repol abnrm suggests ischemia, anterolateral No significant change was found Confirmed by Ezequiel Essex [phone]) on 09/18/2021 4:12:51  AM  Radiology DG Chest 2 View  Result Date: 09/18/2021 CLINICAL DATA:  Weakness, shortness of breath EXAM: CHEST - 2 VIEW COMPARISON:  CTA chest dated 07/03/2020 FINDINGS: Lungs are clear.  No pleural effusion or pneumothorax. The heart is normal in size. Degenerative changes of the visualized thoracolumbar spine. IMPRESSION: Normal chest radiographs. Electronically Signed   By: Julian Hy M.D.   On: 09/18/2021 00:26   CT Angio Chest PE W and/or Wo Contrast  Result Date: 09/18/2021 CLINICAL DATA:  Fatigue and shortness of breath. Pulmonary embolism suspected. EXAM: CT ANGIOGRAPHY CHEST WITH CONTRAST TECHNIQUE: Multidetector CT imaging of the chest was performed using the standard protocol during bolus administration of intravenous contrast. Multiplanar CT image reconstructions and MIPs were obtained to evaluate the vascular anatomy. CONTRAST:  75mL OMNIPAQUE IOHEXOL 350 MG/ML SOLN COMPARISON:  PA and lateral chest today, portable chest 07/03/2020, and CTA chest 07/03/2020 FINDINGS: Cardiovascular: There is mild cardiomegaly with moderate patchy three-vessel calcific CAD, calcifications moderately in the posterior mitral ring and no pericardial effusion or venous dilatation. There is aortic calcification which is moderately heavy in the arch, extending into branch arteries and mild in the descending segment. There is no aortic aneurysm, ulcerative plaque or dissection, with additional calcifications in the aortic annulus. No great vessel stenosis is seen. The pulmonary arteries are normal in caliber and do not show evidence of embolic filling defects. Mediastinum/Nodes: Shotty up to borderline lymph nodes continue to be noted in the precarinal, subcarinal and left-sided prevascular mediastinum and a few borderline sized bilateral hilar nodes. There is no new or worsening adenopathy , no bulky or encasing lymph nodes. The thyroid gland, trachea and esophagus are unremarkable. Lungs/Pleura: There is  asymmetric posterior basal left lower lobe increased opacity with ground-glass appearance, and additional left lower lobe infrahilar ground-glass opacity. The findings could indicate pneumonitis, atelectasis or combination. There is increasing bronchial thickening in both lower lobes as well.  Remaining lungs are clear. There is no pleural effusion or pneumothorax. Upper Abdomen: There are stones in the gallbladder but no wall thickening or pericholecystic edema , no biliary dilatation is seen. Musculoskeletal: There are spinal bridging syndesmophytes, osteopenia and mild thoracic kyphosis. No destructive osseous lesion is seen. Review of the MIP images confirms the above findings. IMPRESSION: 1. No arterial embolus or dilatation is seen , no findings of acute right heart strain. 2. Mild cardiomegaly with coronary artery and aortic atherosclerosis. 3. Stable borderline to slightly prominent lymph nodes. 4. Increased lower lobe bronchial wall thickening of bronchitis with haziness in the left lower lobe infrahilar area and posterior base which could be atelectasis or pneumonitis. Follow-up as indicated. 5. Cholelithiasis. 6. Bridging syndesmophytes in the spine. Electronically Signed   By: Telford Nab M.D.   On: 09/18/2021 06:06    Procedures .Critical Care Performed by: Ezequiel Essex, MD Authorized by: Ezequiel Essex, MD   Critical care provider statement:    Critical care time (minutes):  35   Critical care time was exclusive of:  Separately billable procedures and treating other patients   Critical care was necessary to treat or prevent imminent or life-threatening deterioration of the following conditions:  Respiratory failure   Critical care was time spent personally by me on the following activities:  Development of treatment plan with patient or surrogate, discussions with consultants, evaluation of patient's response to treatment, examination of patient, ordering and review of laboratory  studies, ordering and review of radiographic studies, ordering and performing treatments and interventions, pulse oximetry, re-evaluation of patient's condition and review of old charts   I assumed direction of critical care for this patient from another provider in my specialty: no     Care discussed with: admitting provider     Medications Ordered in ED Medications  ipratropium-albuterol (DUONEB) 0.5-2.5 (3) MG/3ML nebulizer solution 3 mL (has no administration in time range)  methylPREDNISolone sodium succinate (SOLU-MEDROL) 125 mg/2 mL injection 125 mg (has no administration in time range)  magnesium sulfate IVPB 2 g 50 mL (has no administration in time range)    ED Course  I have reviewed the triage vital signs and the nursing notes.  Pertinent labs & imaging results that were available during my care of the patient were reviewed by me and considered in my medical decision making (see chart for details).    MDM Rules/Calculators/A&P                          Generalized weakness for the past 1 week with fatigue and shortness of breath.  Found to be profoundly hypoxic to urgent care in the 70s.  We will give bronchodilators, steroids and magnesium.  Chest x-ray is negative which raises concern for possible pulmonary embolism.  EKG does show ST depressions and T wave inversions laterally which are unchanged.  Troponins remain negative.  No chest pain.  Patient with tachypnea and hypoxia on room air requiring 4 L of oxygen.  She does not wear oxygen at home.  She is moving better air after bronchodilators, steroids and magnesium.  CTPE negative for pulmonary embolism.  Does show possible left basilar pneumonitis and bronchitis.  Also with Rocephin and Zithromax.  Require admission for hypoxic respiratory failure likely secondary to COPD.  Continue bronchodilators, steroids and antibiotics for now.  Discussed with Dr. Benn Moulder was evaluated in Emergency Department on  09/18/2021 for the symptoms described in the  history of present illness. She was evaluated in the context of the global COVID-19 pandemic, which necessitated consideration that the patient might be at risk for infection with the SARS-CoV-2 virus that causes COVID-19. Institutional protocols and algorithms that pertain to the evaluation of patients at risk for COVID-19 are in a state of rapid change based on information released by regulatory bodies including the CDC and federal and state organizations. These policies and algorithms were followed during the patient's care in the ED.  Final Clinical Impression(s) / ED Diagnoses Final diagnoses:  Acute respiratory failure with hypoxia ([place])  COPD exacerbation [facility])    Rx / DC Orders ED Discharge Orders     None        Adonica Fukushima, Annie Main, MD 09/18/21 [phone]

## 2021-09-18 NOTE — ED Notes (Signed)
Pt attempted to provide a urine sample and missed the cup.

## 2021-09-18 NOTE — ED Notes (Signed)
Increased pt's O2 to 3L, changed out [place]

## 2021-09-18 NOTE — ED Notes (Signed)
Pt unable to provide a urine sample at this time.

## 2021-09-18 NOTE — H&P (Signed)
History and Physical    STASIA SOMERO UUV:[id number] DOB: September 15, 1946 DOA: 09/17/2021  PCP: Arthur Holms, NP   Patient coming from: Home.  I have personally briefly reviewed patient's old medical records in [facility]  Chief Complaint: Shortness of breath.  HPI: DERENDA GIDDINGS is a 75 y.o. female with medical history significant of bronchitis, active 2 PPD cigarette smoker, history of COPD, history of pneumonia who is coming to the emergency department due to progressively worse dyspnea associated with cough, wheezing and fatigue for the past week.  [name].  He denied fever, chills, sore throat, rhinorrhea or hemoptysis.  No sick contacts or travel history.  No chest pain, palpitations, diaphoresis, PND, orthopnea or pitting edema lower extremities.  Her appetite is decreased, but no abdominal pain, nausea, emesis, diarrhea, constipation, melena or hematochezia.  No dysuria, frequency or hematuria.  No polyuria, polydipsia, polyphagia or blurred vision.  No difficulty sleeping.  ED Course: Initial vital signs were temperature 99.5 F, pulse 78, respirations 16, BP 150/64 mmHg and O2 70%.  Patient received multiple nebulizer treatment, Solu-Medrol 125 mg IVPB, magnesium sulfate 2 g IVPB x1, ceftriaxone and azithromycin IVPB.  Lab work: Urinalysis showed increase of specific gravity, small hemoglobinuria, proteinuria 30 mg/dL and positive nitrites.  Rare bacteria microscopic examination.  Coronavirus and influenza PCR was negative.  CBC showed a white count of 8.9, hemoglobin 16.8 g/dL platelets 184.  Troponin was 79, then 85 and 97 ng/L.  BNP 353.1 pg/mL.  BMP showed chloride of 93 and CO2 of 36 minimal/L within normal anion gap.  Glucose 106 mg/dL.  The rest of the CMP values including renal function were normal.  Imaging: A 2 view chest radiograph showed no acute abnormality.  CTA chest no PE was seen.  There was mild cardiomegaly with coronary  artery and aortic atherosclerosis.  Slightly prominent lymph nodes.  Increased lower lobe bronchial wall thickening with haziness in the left lower lobe infrahilar area.  This could be atelectasis or pneumonitis.  Please see images and full radiology report for further details.  Review of Systems: As per HPI otherwise all other systems reviewed and are negative.  Past Medical History:  Diagnosis Date   Bronchitis    Cigarette smoker    COPD (chronic obstructive pulmonary disease) (HCC)    Pneumonia    Shortness of breath    with exertion; lasted used inhaler 06/02/13   Past Surgical History:  Procedure Laterality Date   ABDOMINAL HYSTERECTOMY     BREAST LUMPECTOMY WITH NEEDLE LOCALIZATION Left 06/12/2013   Procedure: LEFT BREAST LUMPECTOMY WITH NEEDLE LOCALIZATION;  Surgeon: Imogene Burn. Georgette Dover, MD;  Location: [facility];  Service: General;  Laterality: Left;   BREAST SURGERY     HIP FRACTURE SURGERY Right    Social History  reports that she has been smoking cigarettes. She has a 10.00 pack-year smoking history. She has never used smokeless tobacco. She reports current alcohol use. She reports that she does not use drugs.  No Known Allergies  Family History  Problem Relation Age of Onset   Cancer Mother        oral, brain   Diabetes Father    Prior to Admission medications   Medication Sig Start Date End Date Taking? Authorizing Provider  albuterol (VENTOLIN HFA) 108 (90 Base) MCG/ACT inhaler Inhale 2 puffs into the lungs every 6 (six) hours as needed for wheezing or shortness of breath. 07/07/20  Guilford Shi, MD  cholecalciferol (VITAMIN D3) 25 MCG (1000 UNIT) tablet Take 1,000 Units by mouth daily.    [provider]  ibuprofen (ADVIL,MOTRIN) 200 MG tablet Take 400 mg by mouth every 6 (six) hours as needed for pain.    [provider]  metFORMIN (GLUCOPHAGE) 500 MG tablet Take 1 tablet (500 mg total) by mouth 2 (two) times daily with a meal. 07/07/20 07/07/21   Guilford Shi, MD  nicotine (NICODERM CQ - DOSED IN MG/24 HOURS) 21 mg/24hr patch Place 1 patch (21 mg total) onto the skin daily. Patient not taking: Reported on 12/30/2020 08/05/20   Marshell Garfinkel, MD  nicotine polacrilex (NICORETTE) 2 MG gum Take 1 each (2 mg total) by mouth as needed for smoking cessation. Patient not taking: Reported on 12/30/2020 07/07/20   Guilford Shi, MD  Omega-3 1000 MG CAPS Take 1 capsule by mouth daily.    [provider]  polyethylene glycol (MIRALAX / GLYCOLAX) 17 g packet Take 17 g by mouth daily as needed for mild constipation. Patient not taking: Reported on 12/30/2020 07/07/20   Guilford Shi, MD  umeclidinium-vilanterol (ANORO ELLIPTA) 62.5-25 MCG/INH AEPB Inhale 1 puff into the lungs daily. 07/07/20   Guilford Shi, MD   Physical Exam: Vitals:   09/18/21 0500 09/18/21 0600 09/18/21 0615 09/18/21 0654  BP: 109/61 (!) 149/82 (!) 123/46 134/76  Pulse: 74 77 80 80  Resp: (!) 25 (!) 31 20 17   Temp:      TempSrc:      SpO2: (!) 89% 97% 91% (!) 82%  Weight:      Height:       Constitutional: NAD, calm, comfortable Eyes: PERRL, lids and conjunctivae normal ENMT: Mucous membranes are moist. Posterior pharynx clear of any exudate or lesions. Neck: normal, supple, no masses, no thyromegaly Respiratory: Mild bilateral rhonchi and wheezing, no crackles. Normal respiratory effort. No accessory muscle use.  Cardiovascular: Regular rate and rhythm, no murmurs / rubs / gallops. No extremity edema. 2+ pedal pulses. No carotid bruits.  Abdomen: Obese, no distention.  Soft, no tenderness, no masses palpated. No hepatosplenomegaly. Bowel sounds positive.  Musculoskeletal: Mild generalized weakness.  Mild clubbing / cyanosis. Good ROM, no contractures. Normal muscle tone.  Skin: no rashes, lesions, ulcers on very limited otological examination. Neurologic: CN 2-12 grossly intact. Sensation intact, DTR normal. Strength 5/5 in all 4.  Psychiatric:  Normal judgment and insight. Alert and oriented x 3. Normal mood.   Labs on Admission: I have personally reviewed following labs and imaging studies  CBC: Recent Labs  Lab 09/18/21 0034  WBC 8.9  NEUTROABS 5.5  HGB 16.8*  HCT 55.0*  MCV 99.6  PLT 623   Basic Metabolic Panel: Recent Labs  Lab 09/18/21 0034  NA 137  K 4.6  CL 93*  CO2 36*  GLUCOSE 106*  BUN 15  CREATININE 0.66  CALCIUM 8.9   GFR: Estimated Creatinine Clearance: 62 mL/min (by C-G formula based on SCr of 0.66 mg/dL).  Liver Function Tests: No results for input(s): AST, ALT, ALKPHOS, BILITOT, PROT, ALBUMIN in the last 168 hours.  Urine analysis:    Component Value Date/Time   COLORURINE YELLOW 09/18/2021 0631   APPEARANCEUR HAZY (A) 09/18/2021 0631   LABSPEC >1.046 (H) 09/18/2021 0631   PHURINE 5.0 09/18/2021 0631   GLUCOSEU NEGATIVE 09/18/2021 0631   HGBUR SMALL (A) 09/18/2021 0631   BILIRUBINUR NEGATIVE 09/18/2021 0631   KETONESUR NEGATIVE 09/18/2021 0631   PROTEINUR 30 (A) 09/18/2021 0631  NITRITE POSITIVE (A) 09/18/2021 0631   LEUKOCYTESUR NEGATIVE 09/18/2021 0631   Radiological Exams on Admission: DG Chest 2 View  Result Date: 09/18/2021 CLINICAL DATA:  Weakness, shortness of breath EXAM: CHEST - 2 VIEW COMPARISON:  CTA chest dated 07/03/2020 FINDINGS: Lungs are clear.  No pleural effusion or pneumothorax. The heart is normal in size. Degenerative changes of the visualized thoracolumbar spine. IMPRESSION: Normal chest radiographs. Electronically Signed   By: Julian Hy M.D.   On: 09/18/2021 00:26   CT Angio Chest PE W and/or Wo Contrast  Result Date: 09/18/2021 CLINICAL DATA:  Fatigue and shortness of breath. Pulmonary embolism suspected. EXAM: CT ANGIOGRAPHY CHEST WITH CONTRAST TECHNIQUE: Multidetector CT imaging of the chest was performed using the standard protocol during bolus administration of intravenous contrast. Multiplanar CT image reconstructions and MIPs were obtained to  evaluate the vascular anatomy. CONTRAST:  63mL OMNIPAQUE IOHEXOL 350 MG/ML SOLN COMPARISON:  PA and lateral chest today, portable chest 07/03/2020, and CTA chest 07/03/2020 FINDINGS: Cardiovascular: There is mild cardiomegaly with moderate patchy three-vessel calcific CAD, calcifications moderately in the posterior mitral ring and no pericardial effusion or venous dilatation. There is aortic calcification which is moderately heavy in the arch, extending into branch arteries and mild in the descending segment. There is no aortic aneurysm, ulcerative plaque or dissection, with additional calcifications in the aortic annulus. No great vessel stenosis is seen. The pulmonary arteries are normal in caliber and do not show evidence of embolic filling defects. Mediastinum/Nodes: Shotty up to borderline lymph nodes continue to be noted in the precarinal, subcarinal and left-sided prevascular mediastinum and a few borderline sized bilateral hilar nodes. There is no new or worsening adenopathy , no bulky or encasing lymph nodes. The thyroid gland, trachea and esophagus are unremarkable. Lungs/Pleura: There is asymmetric posterior basal left lower lobe increased opacity with ground-glass appearance, and additional left lower lobe infrahilar ground-glass opacity. The findings could indicate pneumonitis, atelectasis or combination. There is increasing bronchial thickening in both lower lobes as well. Remaining lungs are clear. There is no pleural effusion or pneumothorax. Upper Abdomen: There are stones in the gallbladder but no wall thickening or pericholecystic edema , no biliary dilatation is seen. Musculoskeletal: There are spinal bridging syndesmophytes, osteopenia and mild thoracic kyphosis. No destructive osseous lesion is seen. Review of the MIP images confirms the above findings. IMPRESSION: 1. No arterial embolus or dilatation is seen , no findings of acute right heart strain. 2. Mild cardiomegaly with coronary artery  and aortic atherosclerosis. 3. Stable borderline to slightly prominent lymph nodes. 4. Increased lower lobe bronchial wall thickening of bronchitis with haziness in the left lower lobe infrahilar area and posterior base which could be atelectasis or pneumonitis. Follow-up as indicated. 5. Cholelithiasis. 6. Bridging syndesmophytes in the spine. Electronically Signed   By: Telford Nab M.D.   On: 09/18/2021 06:06    EKG: Independently reviewed.  Vent. rate 72 BPM PR interval 150 ms QRS duration 77 ms QT/QTcB 370/405 ms P-R-T axes 50 98 198 Sinus rhythm Right axis deviation Abnormal T, consider ischemia, diffuse leads  Assessment/Plan Principal Problem:   Acute respiratory failure with hypoxia (HCC)  Due to exacerbation of:  COPD (chronic obstructive pulmonary disease) (HCC) Continue supplemental oxygen. DuoNeb 4 times daily. Albuterol neb every 4 hours as needed. Continue methylprednisolone plus prednisone taper. Continue azithromycin. Check procalcitonin level. Smoking cessation advised.  Active Problems:   Tobacco use Smoking cessation advised. Nicotine replacement therapy ordered.    Polycythemia, secondary Smoking cessation  advised. Follow-up with PCP.    Type 2 diabetes mellitus without complication (HCC) Carbohydrate modified diet. Hold metformin for 48 hours. CBG monitoring with RI SS.    Chronic diastolic CHF (congestive heart failure) (HCC) Positive cardiomegaly on CXR. No signs of volume overload. Check echocardiogram.    Asymptomatic bacteriuria No further action at this time. Patient to monitor for symptoms.      DVT prophylaxis: Lovenox SQ. Code Status:   Full code. Family Communication:   Disposition Plan:   Patient is from:  Home.  Anticipated DC to:  Home.  Anticipated DC date:  09/20/2021.  Anticipated DC barriers: Clinical status.  Consults called:   Admission status:  Telemetry as/inpatient.  Severity of Illness:  High severity after  presenting to the emergency department with severe hypoxia requiring supplemental oxygen and aggressive treatment with nebulized bronchodilators.  Patient will remain in the hospital for close observation and further treatment.  Reubin Milan MD [facility]  How to contact the [facility] Attending or Consulting provider [place] or covering provider during after hours [place], for this patient?   Check the care team in [facility] and look for a) attending/consulting [facility] provider listed and b) the [facility] team listed Log into [email or web address] and use [place]'s universal password to access. If you do not have the password, please contact the hospital operator. Locate the [facility] provider you are looking for under [facility] and page to a number that you can be directly reached. If you still have difficulty reaching the provider, please page the [facility] (Director on Call) for the Hospitalists listed on amion for assistance.  09/18/2021, 7:51 AM   This document was prepared using [employer] and may contain some unintended transcription errors.

## 2021-09-19 ENCOUNTER — Inpatient Hospital Stay (HOSPITAL_COMMUNITY): Payer: Medicare (Managed Care)

## 2021-09-19 DIAGNOSIS — I5032 Chronic diastolic (congestive) heart failure: Secondary | ICD-10-CM | POA: Diagnosis not present

## 2021-09-19 DIAGNOSIS — I517 Cardiomegaly: Secondary | ICD-10-CM

## 2021-09-19 LAB — ECHOCARDIOGRAM COMPLETE
AR max vel: 1.44 cm2
AV Area VTI: 1.56 cm2
AV Area mean vel: 1.4 cm2
AV Mean grad: 10.7 mmHg
AV Peak grad: 18.2 mmHg
Ao pk vel: 2.13 m/s
Area-P 1/2: 2.8 cm2
Calc EF: 67.5 %
Height: 62 in
S' Lateral: 2.6 cm
Single Plane A2C EF: 68.1 %
Single Plane A4C EF: 67.7 %
Weight: 2880 [oz_av]

## 2021-09-19 LAB — GLUCOSE, CAPILLARY
Glucose-Capillary: 138 mg/dL — ABNORMAL HIGH (ref 70–99)
Glucose-Capillary: 180 mg/dL — ABNORMAL HIGH (ref 70–99)
Glucose-Capillary: 142 mg/dL — ABNORMAL HIGH (ref 70–99)
Glucose-Capillary: 181 mg/dL — ABNORMAL HIGH (ref 70–99)

## 2021-09-19 MED ORDER — AZITHROMYCIN 250 MG PO TABS
500.0000 mg | ORAL_TABLET | Freq: Every day | ORAL | Status: AC
  Administered 2021-09-19: 15:00:00 500 mg via ORAL
  Filled 2021-09-19: qty 2

## 2021-09-19 MED ORDER — PERFLUTREN LIPID MICROSPHERE
1.0000 mL | INTRAVENOUS | Status: AC | PRN
  Administered 2021-09-19: 09:00:00 2 mL via INTRAVENOUS
  Filled 2021-09-19: qty 10

## 2021-09-19 MED ORDER — AZITHROMYCIN 250 MG PO TABS
250.0000 mg | ORAL_TABLET | Freq: Every day | ORAL | Status: DC
  Administered 2021-09-20 – 2021-09-21 (×2): 250 mg via ORAL
  Filled 2021-09-19 (×2): qty 1

## 2021-09-19 MED ORDER — METHYLPREDNISOLONE SODIUM SUCC 40 MG IJ SOLR
40.0000 mg | Freq: Two times a day (BID) | INTRAMUSCULAR | Status: DC
  Administered 2021-09-19 – 2021-09-21 (×4): 40 mg via INTRAVENOUS
  Filled 2021-09-19 (×5): qty 1

## 2021-09-19 NOTE — Progress Notes (Addendum)
[name]:[id number] DOB: [birth year] DOA: 09/17/2021 PCP: Arthur Holms, NP  [name]: Deanna Petty is a 75 y.o. female with medical history significant of bronchitis, active 2 PPD cigarette smoker, history of COPD, history of pneumonia who is coming to the emergency department due to progressively worsening dyspnea associated with productive cough, wheezing and fatigue for the past week.  [name].  Work-up revealed COPD exacerbation.  CTA chest was negative for PE.  09/19/21: Patient was seen and examined at bedside.  Cough is improved.  [name] Active Problems:   Tobacco use   Type 2 diabetes mellitus without complication (HCC)   Chronic diastolic CHF (congestive heart failure) (HCC)   Polycythemia, secondary   COPD (chronic obstructive pulmonary disease) ([place])   Asymptomatic bacteriuria   COPD exacerbation Ongoing tobacco user, tobacco cessation counseled on at baseline Continue bronchodilators Continue pulmonary toilet Continue azithromycin and IV steroids Maintain oxygen saturation greater than 90%  Acute hypoxic respiratory failure secondary to COPD exacerbation Normal oxygen supplementation at baseline Wean off oxygen supplementation as tolerated Will need a home oxygen evaluation prior to DC Rest of management as stated above.  Elevated troponin likely demand ischemia in the setting of acute hypoxic respiratory failure Troponin peaked at 97 No evidence of acute ischemia on twelve-lead EKG done on admission She denies any anginal symptoms. Continue to monitor on telemetry. Follow 2D echo. Obtain lipid panel in the morning  Type 2 diabetes with hyperglycemia, exacerbated by IV steroids Obtain hemoglobin  A1c Continuous Scan Hold off home oral hypoglycemics.  Tobacco use disorder Tobacco cessation counseling done at bedside Nicotine patch as needed  Secondary polycythemia Suspected secondary to tobacco use disorder Follow-up with PCP  Chronic diastolic CHF Appears euvolemic on exam BNP 353 Follow 2D echo Strict I's and O's and daily weight  Asymptomatic bacteremia No further action at this time Patient to monitor for symptoms   Code Status: Full code  Family Communication: None at bedside  Disposition Plan: Likely will discharge to home on 2522   Consultants: None  Procedures: 2D echo  Antimicrobials: Azithromycin   DVT prophylaxis: Subcu Lovenox daily  Status is: Inpatient  Patient requires at least 2 midnights for further evaluation and treatment of present condition.      Objective: Vitals:   09/18/21 1640 09/18/21 2109 09/18/21 2342 09/19/21 0554  BP: (!) 172/64 (!) 119/51 (!) 138/56 (!) 160/66  Pulse: 83 80 76 75  Resp: 20 16 16 18   Temp: 98.7 F (37.1 C) 98.1 F (36.7 C) 97.9 F (36.6 C) 98.1 F (36.7 C)  TempSrc: Oral Oral Oral Oral  SpO2: 96% 90% 91% 91%  Weight:      Height:        Intake/Output Summary (Last 24 hours) at 09/19/2021 1349 Last data filed at 09/19/2021 0957 Gross per 24 hour  Intake 240 ml  Output --  Net 240 ml   Filed Weights   09/17/21 1812  Weight: 81.6 kg    Exam:  General: 75 y.o. year-old female well developed well nourished in no acute distress.  Alert and oriented x3. Cardiovascular: Regular rate and rhythm with no rubs or gallops.  No thyromegaly or JVD noted.   Respiratory: Diffuse wheezing bilaterally. Good inspiratory  effort. Abdomen: Soft nontender nondistended with normal bowel sounds x4 quadrants. Musculoskeletal: Trace lower extremity edema. 2/4 pulses in all 4 extremities. Skin: No ulcerative lesions noted or rashes, Psychiatry: Mood is appropriate for condition and setting   Data  Reviewed: CBC: Recent Labs  Lab 09/18/21 0034  WBC 8.9  NEUTROABS 5.5  HGB 16.8*  HCT 55.0*  MCV 99.6  PLT 562   Basic Metabolic Panel: Recent Labs  Lab 09/18/21 0034  NA 137  K 4.6  CL 93*  CO2 36*  GLUCOSE 106*  BUN 15  CREATININE 0.66  CALCIUM 8.9   GFR: Estimated Creatinine Clearance: 62 mL/min (by C-G formula based on SCr of 0.66 mg/dL). Liver Function Tests: No results for input(s): AST, ALT, ALKPHOS, BILITOT, PROT, ALBUMIN in the last 168 hours. No results for input(s): LIPASE, AMYLASE in the last 168 hours. No results for input(s): AMMONIA in the last 168 hours. Coagulation Profile: No results for input(s): INR, PROTIME in the last 168 hours. Cardiac Enzymes: No results for input(s): CKTOTAL, CKMB, CKMBINDEX, TROPONINI in the last 168 hours. BNP (last 3 results) No results for input(s): PROBNP in the last 8760 hours. HbA1C: Recent Labs    09/18/21 1255  HGBA1C 7.4*   CBG: Recent Labs  Lab 09/18/21 0813 09/18/21 1855 09/18/21 2111 09/19/21 0753 09/19/21 1143  GLUCAP 162* 143* 181* 138* 180*   Lipid Profile: No results for input(s): CHOL, HDL, LDLCALC, TRIG, CHOLHDL, LDLDIRECT in the last 72 hours. Thyroid Function Tests: No results for input(s): TSH, T4TOTAL, FREET4, T3FREE, THYROIDAB in the last 72 hours. Anemia Panel: No results for input(s): VITAMINB12, FOLATE, FERRITIN, TIBC, IRON, RETICCTPCT in the last 72 hours. Urine analysis:    Component Value Date/Time   COLORURINE YELLOW 09/18/2021 0631   APPEARANCEUR HAZY (A) 09/18/2021 0631   LABSPEC >1.046 (H) 09/18/2021 0631   PHURINE 5.0 09/18/2021 0631   GLUCOSEU NEGATIVE 09/18/2021 0631   HGBUR SMALL (A) 09/18/2021 0631   BILIRUBINUR NEGATIVE 09/18/2021 0631   KETONESUR NEGATIVE 09/18/2021 0631   PROTEINUR 30 (A) 09/18/2021 0631   NITRITE POSITIVE (A) 09/18/2021 0631   LEUKOCYTESUR NEGATIVE 09/18/2021 0631   Sepsis Labs: @LABRCNTIP (procalcitonin:4,lacticidven:4)  ) Recent Results  (from the past 240 hour(s))  Resp Panel by RT-PCR (Flu A&B, Covid) Nasopharyngeal Swab     Status: None   Collection Time: 09/18/21  5:54 AM   Specimen: Nasopharyngeal Swab; Nasopharyngeal(NP) swabs in vial transport medium  Result Value Ref Range Status   SARS Coronavirus 2 by RT PCR NEGATIVE NEGATIVE Final    Comment: (NOTE) SARS-CoV-2 target nucleic acids are NOT DETECTED.  The SARS-CoV-2 RNA is generally detectable in upper respiratory specimens during the acute phase of infection. The lowest concentration of SARS-CoV-2 viral copies this assay can detect is 138 copies/mL. A negative result does not preclude SARS-Cov-2 infection and should not be used as the sole basis for treatment or other patient management decisions. A negative result may occur with  improper specimen collection/handling, submission of specimen other than nasopharyngeal swab, presence of viral mutation(s) within the areas targeted by this assay, and inadequate number of viral copies(<138 copies/mL). A negative result must be combined with clinical observations, patient history, and epidemiological information. The expected result is Negative.  Fact Sheet for Patients:  EntrepreneurPulse.com.au  Fact Sheet for Healthcare Providers:  IncredibleEmployment.be  This test is no t yet approved or cleared by the [place] FDA and  has been authorized for detection and/or diagnosis of SARS-CoV-2 by FDA under an  Emergency Use Authorization (EUA). This EUA will remain  in effect (meaning this test can be used) for the duration of the COVID-19 declaration under Section 564(b)(1) of the Act, 21 U.S.C.section 360bbb-3(b)(1), unless the authorization is terminated  or revoked sooner.       Influenza A by PCR NEGATIVE NEGATIVE Final   Influenza B by PCR NEGATIVE NEGATIVE Final    Comment: (NOTE) The Xpert Xpress SARS-CoV-2/FLU/RSV plus assay is intended as an aid in the  diagnosis of influenza from Nasopharyngeal swab specimens and should not be used as a sole basis for treatment. Nasal washings and aspirates are unacceptable for Xpert Xpress SARS-CoV-2/FLU/RSV testing.  Fact Sheet for Patients: EntrepreneurPulse.com.au  Fact Sheet for Healthcare Providers: IncredibleEmployment.be  This test is not yet approved or cleared by the [place] FDA and has been authorized for detection and/or diagnosis of SARS-CoV-2 by FDA under an Emergency Use Authorization (EUA). This EUA will remain in effect (meaning this test can be used) for the duration of the COVID-19 declaration under Section 564(b)(1) of the Act, 21 U.S.C. section 360bbb-3(b)(1), unless the authorization is terminated or revoked.  Performed at [facility], [place] [address], [place], [place] [address]       Studies: ECHOCARDIOGRAM COMPLETE  Result Date: 09/19/2021    ECHOCARDIOGRAM REPORT   Patient Name:   Deanna Petty Date of Exam: 09/19/2021 Medical Rec #:  [id number]     Height:       62.0 in Accession #:    [id number]    Weight:       180.0 lb Date of Birth:  Oct 10, 1946    BSA:          1.828 m Patient Age:    69 years      BP:           160/66 mmHg Patient Gender: F             HR:           75 bpm. Exam Location:  Inpatient Procedure: 2D Echo, Cardiac Doppler, Color Doppler and Intracardiac            Opacification Agent Indications:    Cardiomegaly, CHF, Elevated troponins  History:        Patient has prior history of Echocardiogram examinations. CHF,                 COPD; Risk Factors:Diabetes.  Sonographer:    Jyl Heinz Referring Phys: [id number] DAVID MANUEL [place]  1. Left ventricular ejection fraction, by estimation, is 70 to 75%. The left ventricle has hyperdynamic function. The left ventricle has no regional wall motion abnormalities. Left ventricular diastolic parameters are indeterminate. Elevated left ventricular  end-diastolic pressure.  2. Right ventricular systolic function is normal. The right ventricular size is normal.  3. The mitral valve is normal in structure. No evidence of mitral valve regurgitation. No evidence of mitral stenosis.  4. The aortic valve is calcified. There is mild calcification of the aortic valve. There is mild thickening of the aortic valve. Aortic valve regurgitation is not visualized. Mild aortic valve stenosis. FINDINGS  Left Ventricle: Left ventricular ejection fraction, by estimation, is 70 to 75%. The left ventricle has hyperdynamic function. The left ventricle has no regional wall motion abnormalities. The left ventricular internal cavity size was normal in size. There is no left ventricular hypertrophy. Left ventricular diastolic parameters are indeterminate. Elevated left ventricular end-diastolic pressure. Right Ventricle: The right ventricular size is  normal. Right vetricular wall thickness was not well visualized. Right ventricular systolic function is normal. Left Atrium: Left atrial size was normal in size. Right Atrium: Right atrial size was normal in size. Pericardium: There is no evidence of pericardial effusion. Mitral Valve: The mitral valve is normal in structure. No evidence of mitral valve regurgitation. No evidence of mitral valve stenosis. Tricuspid Valve: The tricuspid valve is grossly normal. Tricuspid valve regurgitation is trivial. Aortic Valve: The aortic valve is calcified. There is mild calcification of the aortic valve. There is mild thickening of the aortic valve. Aortic valve regurgitation is not visualized. Mild aortic stenosis is present. Aortic valve mean gradient measures  10.7 mmHg. Aortic valve peak gradient measures 18.2 mmHg. Aortic valve area, by VTI measures 1.56 cm. Pulmonic Valve: The pulmonic valve was not well visualized. Pulmonic valve regurgitation is not visualized. Aorta: The aortic root and ascending aorta are structurally normal, with no  evidence of dilitation. IAS/Shunts: The interatrial septum was not well visualized.  LEFT VENTRICLE PLAX 2D LVIDd:         3.60 cm     Diastology LVIDs:         2.60 cm     LV e' medial:    5.66 cm/s LV PW:         1.20 cm     LV E/e' medial:  20.8 LV IVS:        1.30 cm     LV e' lateral:   6.20 cm/s LVOT diam:     1.80 cm     LV E/e' lateral: 19.0 LV SV:         73 LV SV Index:   40 LVOT Area:     2.54 cm  LV Volumes (MOD) LV vol d, MOD A2C: 86.2 ml LV vol d, MOD A4C: 79.2 ml LV vol s, MOD A2C: 27.5 ml LV vol s, MOD A4C: 25.6 ml LV SV MOD A2C:     58.7 ml LV SV MOD A4C:     79.2 ml LV SV MOD BP:      56.2 ml RIGHT VENTRICLE            IVC RV Basal diam:  3.30 cm    IVC diam: 2.20 cm RV Mid diam:    2.50 cm RV S prime:     8.16 cm/s TAPSE (M-mode): 1.6 cm LEFT ATRIUM             Index        RIGHT ATRIUM           Index LA diam:        4.00 cm 2.19 cm/m   RA Area:     14.70 cm LA Vol (A2C):   56.0 ml 30.64 ml/m  RA Volume:   35.00 ml  19.15 ml/m LA Vol (A4C):   42.4 ml 23.20 ml/m LA Biplane Vol: 49.9 ml 27.30 ml/m  AORTIC VALVE AV Area (Vmax):    1.44 cm AV Area (Vmean):   1.40 cm AV Area (VTI):     1.56 cm AV Vmax:           213.33 cm/s AV Vmean:          159.000 cm/s AV VTI:            0.468 m AV Peak Grad:      18.2 mmHg AV Mean Grad:      10.7 mmHg LVOT Vmax:  120.50 cm/s LVOT Vmean:        87.400 cm/s LVOT VTI:          0.288 m LVOT/AV VTI ratio: 0.61  AORTA Ao Root diam: 2.40 cm Ao Asc diam:  2.90 cm MITRAL VALVE MV Area (PHT): 2.80 cm     SHUNTS MV Decel Time: 271 msec     Systemic VTI:  0.29 m MV E velocity: 118.00 cm/s  Systemic Diam: 1.80 cm MV A velocity: 123.00 cm/s MV E/A ratio:  0.96 Mertie Moores MD Electronically signed by Mertie Moores MD Signature Date/Time: 09/19/2021/12:17:46 PM    Final     Scheduled Meds:  enoxaparin (LOVENOX) injection  40 mg Subcutaneous Q24H   insulin aspart  0-15 Units Subcutaneous TID WC   mouth rinse  15 mL Mouth Rinse BID   predniSONE  40 mg Oral  Q breakfast    Continuous Infusions:   LOS: 1 day     Kayleen Memos, MD [facility] Pager [phone]  If 7PM-7AM, please contact night-coverage [email or web address] Password [email or web address] 09/19/2021, 1:49 PM

## 2021-09-19 NOTE — Evaluation (Signed)
Physical Therapy Evaluation Patient Details Name: Deanna Petty MRN: [id number] DOB: Mar 14, 1946 Today's Date: 09/19/2021  History of Present Illness  Pt admitted with SOB with acute respiratory failure 2* COPD exacerbation.  Pt wtih hx of COPD, CHF, DM, and polycythemia  Clinical Impression  Pt admitted as above and presenting with functional mobility limitations 2* generalized LE weakness, ambulatory balance deficits and decreased endurance.  Pt up to ambulate in hall this pm with one noted episode of balance loss with QC and with desat on RA to 80%.  Pt ambulated additional distance on 2L O2 with sats at 92% or higher.  RN recording for O2 qualification.  Throughout pt denies SOB.  Pt plans dc to prior living arrangement with limited assist.     Recommendations for follow up therapy are one component of a multi-disciplinary discharge planning process, led by the attending physician.  Recommendations may be updated based on patient status, additional functional criteria and insurance authorization.  Follow Up Recommendations Home health PT    Assistance Recommended at Discharge PRN  Functional Status Assessment Patient has had a recent decline in their functional status and demonstrates the ability to make significant improvements in function in a reasonable and predictable amount of time.  Equipment Recommendations  None recommended by PT    Recommendations for Other Services       Precautions / Restrictions Precautions Precautions: Fall Restrictions Weight Bearing Restrictions: No      Mobility  Bed Mobility Overal bed mobility: Modified Independent                  Transfers Overall transfer level: Modified independent Equipment used: None                    Ambulation/Gait Ambulation/Gait assistance: Min assist;Min guard Gait Distance (Feet): 200 Feet Assistive device: Quad cane Gait Pattern/deviations: Step-through pattern;Decreased step length -  right;Decreased step length - left;Shuffle;Trunk flexed;Wide base of support Gait velocity: decr     General Gait Details: Pt with mild general instability compensating with widened BOS.  One LOB forward requiring min assist to steady.  Stairs            Wheelchair Mobility    Modified Rankin (Stroke Patients Only)       Balance Overall balance assessment: Needs assistance Sitting-balance support: No upper extremity supported;Feet supported Sitting balance-Leahy Scale: Good     Standing balance support: No upper extremity supported Standing balance-Leahy Scale: Fair                               Pertinent Vitals/Pain Pain Assessment: No/denies pain    Home Living Family/patient expects to be discharged to:: Private residence Living Arrangements: Other relatives Available Help at Discharge: Available PRN/intermittently;Family;Friend(s) (Pt states she lives with cousin but min support available from her) Type of Home: House Home Access: Stairs to enter Entrance Stairs-Rails: None Entrance Stairs-Number of Steps: 2   Home Layout: One level Home Equipment: Conservation officer, nature (2 wheels);Cane - quad      Prior Function Prior Level of Function : Independent/Modified Independent             Mobility Comments: ambulated with SB QC with max distance tolerated to mail box and back before legs got tired       Hand Dominance        Extremity/Trunk Assessment   Upper Extremity Assessment Upper Extremity Assessment: Overall WFL for  tasks assessed    Lower Extremity Assessment Lower Extremity Assessment: Generalized weakness    Cervical / Trunk Assessment Cervical / Trunk Assessment: Normal  Communication   Communication: No difficulties  Cognition Arousal/Alertness: Awake/alert Behavior During Therapy: WFL for tasks assessed/performed Overall Cognitive Status: Within Functional Limits for tasks assessed                                           General Comments      Exercises     Assessment/Plan    PT Assessment Patient needs continued PT services  PT Problem List Decreased strength;Decreased activity tolerance;Decreased balance;Decreased mobility;Decreased knowledge of use of DME       PT Treatment Interventions DME instruction;Gait training;Stair training;Functional mobility training;Therapeutic activities;Therapeutic exercise;Balance training;Patient/family education    PT Goals (Current goals can be found in the Care Plan section)  Acute Rehab PT Goals Patient Stated Goal: HOME PT Goal Formulation: With patient Time For Goal Achievement: 10/03/21 Potential to Achieve Goals: Good    Frequency Min 3X/week   Barriers to discharge        Co-evaluation               AM-PAC PT "6 Clicks" Mobility  Outcome Measure Help needed turning from your back to your side while in a flat bed without using bedrails?: None Help needed moving from lying on your back to sitting on the side of a flat bed without using bedrails?: None Help needed moving to and from a bed to a chair (including a wheelchair)?: None Help needed standing up from a chair using your arms (e.g., wheelchair or bedside chair)?: None Help needed to walk in hospital room?: A Little Help needed climbing 3-5 steps with a railing? : A Little 6 Click Score: 22    End of Session Equipment Utilized During Treatment: Gait belt;Oxygen Activity Tolerance: Patient tolerated treatment well Patient left: Other (comment) (sitting EOB) Nurse Communication: Mobility status PT Visit Diagnosis: Unsteadiness on feet (R26.81);Muscle weakness (generalized) (M62.81);Difficulty in walking, not elsewhere classified (R26.2)    Time: 9563-8756 PT Time Calculation (min) (ACUTE ONLY): 22 min   Charges:   PT Evaluation $PT Eval Low Complexity: 1 Low          [facility] Pager [phone] Office  [phone]   Kalkidan Caudell 09/19/2021, 4:56 PM

## 2021-09-19 NOTE — Progress Notes (Signed)
SATURATION QUALIFICATIONS: (This note is used to comply with regulatory documentation for home oxygen)  Patient Saturations on Room Air at Rest = 87%%  Patient Saturations on Room Air while Ambulating = 77%  Patient Saturations on 2 Liters of oxygen while Ambulating = 92%  Please briefly explain why patient needs home oxygen:Patient needs O2 because when she's ambulating on room air she desat's to 77% and sitting on the bed with room air she desat's to 87%.

## 2021-09-20 LAB — LIPID PANEL
Cholesterol: 110 mg/dL (ref 0–200)
HDL: 44 mg/dL (ref >40)
LDL Cholesterol: 55 mg/dL (ref 0–99)
Total CHOL/HDL Ratio: 2.5 RATIO
Triglycerides: 55 mg/dL (ref <150)
VLDL: 11 mg/dL (ref 0–40)

## 2021-09-20 LAB — GLUCOSE, CAPILLARY
Glucose-Capillary: 153 mg/dL — ABNORMAL HIGH (ref 70–99)
Glucose-Capillary: 97 mg/dL (ref 70–99)
Glucose-Capillary: 223 mg/dL — ABNORMAL HIGH (ref 70–99)
Glucose-Capillary: 187 mg/dL — ABNORMAL HIGH (ref 70–99)

## 2021-09-20 MED ORDER — IPRATROPIUM-ALBUTEROL 0.5-2.5 (3) MG/3ML IN SOLN
3.0000 mL | Freq: Two times a day (BID) | RESPIRATORY_TRACT | Status: DC
  Administered 2021-09-20 – 2021-09-21 (×2): 3 mL via RESPIRATORY_TRACT
  Filled 2021-09-20 (×2): qty 3

## 2021-09-20 MED ORDER — IPRATROPIUM-ALBUTEROL 0.5-2.5 (3) MG/3ML IN SOLN
3.0000 mL | Freq: Four times a day (QID) | RESPIRATORY_TRACT | Status: DC
  Administered 2021-09-20: 3 mL via RESPIRATORY_TRACT
  Filled 2021-09-20: qty 3

## 2021-09-20 MED ORDER — GUAIFENESIN ER 600 MG PO TB12
1200.0000 mg | ORAL_TABLET | Freq: Two times a day (BID) | ORAL | Status: DC
  Administered 2021-09-20 – 2021-09-21 (×3): 1200 mg via ORAL
  Filled 2021-09-20 (×3): qty 2

## 2021-09-20 MED ORDER — FUROSEMIDE 10 MG/ML IJ SOLN
20.0000 mg | Freq: Two times a day (BID) | INTRAMUSCULAR | Status: DC
  Administered 2021-09-20: 20 mg via INTRAVENOUS
  Filled 2021-09-20: qty 2

## 2021-09-20 MED ORDER — FUROSEMIDE 10 MG/ML IJ SOLN
20.0000 mg | Freq: Three times a day (TID) | INTRAMUSCULAR | Status: AC
  Administered 2021-09-20 – 2021-09-21 (×3): 20 mg via INTRAVENOUS
  Filled 2021-09-20 (×3): qty 2

## 2021-09-20 NOTE — Progress Notes (Signed)
PROGRESS NOTE  [name] IWO:[id number] DOB: [birth year] DOA: 09/17/2021 PCP: Arthur Holms, NP  [name]: Deanna Petty is a 75 y.o. female with medical history significant of bronchitis, active 2 PPD cigarette smoker, history of COPD, history of pneumonia who presented to the [facility] ED due to progressively worsening dyspnea associated with productive cough, wheezing and fatigue for the past week.  [name].  Work-up revealed COPD exacerbation.  CTA chest was negative for PE.  Hospital course complicated by persistent hypoxia, weaning off oxygen supplementation as tolerated.  09/20/21: Patient was seen and examined at her bedside.  Wheezing and cough are improving on IV steroids and breathing treatment.  Also receiving IV diuresing with IV Lasix 20 mg 3 times daily x3 doses.  [name] Active Problems:   Tobacco use   Type 2 diabetes mellitus without complication (HCC)   Chronic diastolic CHF (congestive heart failure) (HCC)   Polycythemia, secondary   COPD (chronic obstructive pulmonary disease) ([place])   Asymptomatic bacteriuria   COPD exacerbation Ongoing tobacco user, tobacco cessation counseled on at baseline Continue bronchodilators, IV steroids, azithromycin, pulmonary toilet Maintain O2 saturation greater than 90%.  Acute hypoxic respiratory failure secondary to COPD exacerbation Not on oxygen supplementation at baseline Currently requiring 2 L to maintain O2 saturation greater than 90% She failed home oxygen evaluation.  Elevated troponin likely demand ischemia in the setting of acute hypoxic respiratory failure Troponin peaked at 97 No evidence of acute ischemia on twelve-lead EKG done on admission She denies any anginal symptoms. Continue to monitor on telemetry. 2D echo done on 02/17/2021 showed normal LVEF. LDL 55, at goal.  Type  2 diabetes with hyperglycemia, exacerbated by IV steroids Hemoglobin A1c 7.4 on 09/18/2021. Continue to hold off home oral hypoglycemics. Continue insulin sliding scale.  Tobacco use disorder Tobacco cessation counseling done at bedside Nicotine patch as needed  Secondary polycythemia Suspected secondary to tobacco use disorder Follow-up with PCP  Chronic diastolic CHF Appears euvolemic on exam BNP 353 Strict I's and O's and daily weight  Asymptomatic bacteremia No further action at this time Patient to monitor for symptoms   Code Status: Full code  Family Communication: None at bedside  Disposition Plan: Likely will discharge to home on 09/21/2021.   Consultants: None  Procedures: 2D echo  Antimicrobials: Azithromycin   DVT prophylaxis: Subcu Lovenox daily  Status is: Inpatient  Patient requires at least 2 midnights for further evaluation and treatment of present condition.      Objective: Vitals:   09/19/21 2040 09/20/21 0457 09/20/21 0902 09/20/21 1317  BP: (!) 148/59 (!) 143/53  (!) 159/61  Pulse: 68 64  75  Resp: 16 18  16   Temp: 97.9 F (36.6 C) 98.2 F (36.8 C)  (!) 97.4 F (36.3 C)  TempSrc: Oral Oral  Oral  SpO2: 93% 94% 95% 95%  Weight:      Height:        Intake/Output Summary (Last 24 hours) at 09/20/2021 1423 Last data filed at 09/20/2021 1300 Gross per 24 hour  Intake 480 ml  Output --  Net 480 ml   Filed Weights   09/17/21 1812  Weight: 81.6 kg    Exam:  General: 75 y.o. year-old female well-developed well-nourished in no acute distress.  She is alert and oriented x3.  Cardiovascular: Regular rate and rhythm no rubs  or gallops.  No JVD or thyromegaly noted. Respiratory: Faint wheezes noted much improved.  Good inspiratory effort. Abdomen: Soft nontender normal bowel sounds present.   Musculoskeletal: Trace lower extremity edema bilaterally.   Skin: No ulcerative lesions noted. Psychiatry: Mood is appropriate for  condition and setting.   Data Reviewed: CBC: Recent Labs  Lab 09/18/21 0034  WBC 8.9  NEUTROABS 5.5  HGB 16.8*  HCT 55.0*  MCV 99.6  PLT 858   Basic Metabolic Panel: Recent Labs  Lab 09/18/21 0034  NA 137  K 4.6  CL 93*  CO2 36*  GLUCOSE 106*  BUN 15  CREATININE 0.66  CALCIUM 8.9   GFR: Estimated Creatinine Clearance: 62 mL/min (by C-G formula based on SCr of 0.66 mg/dL). Liver Function Tests: No results for input(s): AST, ALT, ALKPHOS, BILITOT, PROT, ALBUMIN in the last 168 hours. No results for input(s): LIPASE, AMYLASE in the last 168 hours. No results for input(s): AMMONIA in the last 168 hours. Coagulation Profile: No results for input(s): INR, PROTIME in the last 168 hours. Cardiac Enzymes: No results for input(s): CKTOTAL, CKMB, CKMBINDEX, TROPONINI in the last 168 hours. BNP (last 3 results) No results for input(s): PROBNP in the last 8760 hours. HbA1C: Recent Labs    09/18/21 1255  HGBA1C 7.4*   CBG: Recent Labs  Lab 09/19/21 1143 09/19/21 1639 09/19/21 2042 09/20/21 0715 09/20/21 1148  GLUCAP 180* 142* 181* 153* 97   Lipid Profile: Recent Labs    09/20/21 0509  CHOL 110  HDL 44  LDLCALC 55  TRIG 55  CHOLHDL 2.5   Thyroid Function Tests: No results for input(s): TSH, T4TOTAL, FREET4, T3FREE, THYROIDAB in the last 72 hours. Anemia Panel: No results for input(s): VITAMINB12, FOLATE, FERRITIN, TIBC, IRON, RETICCTPCT in the last 72 hours. Urine analysis:    Component Value Date/Time   COLORURINE YELLOW 09/18/2021 0631   APPEARANCEUR HAZY (A) 09/18/2021 0631   LABSPEC >1.046 (H) 09/18/2021 0631   PHURINE 5.0 09/18/2021 0631   GLUCOSEU NEGATIVE 09/18/2021 0631   HGBUR SMALL (A) 09/18/2021 0631   BILIRUBINUR NEGATIVE 09/18/2021 0631   KETONESUR NEGATIVE 09/18/2021 0631   PROTEINUR 30 (A) 09/18/2021 0631   NITRITE POSITIVE (A) 09/18/2021 0631   LEUKOCYTESUR NEGATIVE 09/18/2021 0631   Sepsis  Labs: @LABRCNTIP (procalcitonin:4,lacticidven:4)  ) Recent Results (from the past 240 hour(s))  Resp Panel by RT-PCR (Flu A&B, Covid) Nasopharyngeal Swab     Status: None   Collection Time: 09/18/21  5:54 AM   Specimen: Nasopharyngeal Swab; Nasopharyngeal(NP) swabs in vial transport medium  Result Value Ref Range Status   SARS Coronavirus 2 by RT PCR NEGATIVE NEGATIVE Final    Comment: (NOTE) SARS-CoV-2 target nucleic acids are NOT DETECTED.  The SARS-CoV-2 RNA is generally detectable in upper respiratory specimens during the acute phase of infection. The lowest concentration of SARS-CoV-2 viral copies this assay can detect is 138 copies/mL. A negative result does not preclude SARS-Cov-2 infection and should not be used as the sole basis for treatment or other patient management decisions. A negative result may occur with  improper specimen collection/handling, submission of specimen other than nasopharyngeal swab, presence of viral mutation(s) within the areas targeted by this assay, and inadequate number of viral copies(<138 copies/mL). A negative result must be combined with clinical observations, patient history, and epidemiological information. The expected result is Negative.  Fact Sheet for Patients:  EntrepreneurPulse.com.au  Fact Sheet for Healthcare Providers:  IncredibleEmployment.be  This test is no t yet approved or cleared  by the [place] and  has been authorized for detection and/or diagnosis of SARS-CoV-2 by FDA under an Emergency Use Authorization (EUA). This EUA will remain  in effect (meaning this test can be used) for the duration of the COVID-19 declaration under Section 564(b)(1) of the Act, 21 U.S.C.section 360bbb-3(b)(1), unless the authorization is terminated  or revoked sooner.       Influenza A by PCR NEGATIVE NEGATIVE Final   Influenza B by PCR NEGATIVE NEGATIVE Final    Comment: (NOTE) The Xpert  Xpress SARS-CoV-2/FLU/RSV plus assay is intended as an aid in the diagnosis of influenza from Nasopharyngeal swab specimens and should not be used as a sole basis for treatment. Nasal washings and aspirates are unacceptable for Xpert Xpress SARS-CoV-2/FLU/RSV testing.  Fact Sheet for Patients: EntrepreneurPulse.com.au  Fact Sheet for Healthcare Providers: IncredibleEmployment.be  This test is not yet approved or cleared by the [place] FDA and has been authorized for detection and/or diagnosis of SARS-CoV-2 by FDA under an Emergency Use Authorization (EUA). This EUA will remain in effect (meaning this test can be used) for the duration of the COVID-19 declaration under Section 564(b)(1) of the Act, 21 U.S.C. section 360bbb-3(b)(1), unless the authorization is terminated or revoked.  Performed at [facility], [place] [address], [place], [place] [address]       Studies: No results found.  Scheduled Meds:  azithromycin  250 mg Oral Daily   enoxaparin (LOVENOX) injection  40 mg Subcutaneous Q24H   furosemide  20 mg Intravenous TID   guaiFENesin  1,200 mg Oral BID   insulin aspart  0-15 Units Subcutaneous TID WC   ipratropium-albuterol  3 mL Nebulization BID   mouth rinse  15 mL Mouth Rinse BID   methylPREDNISolone (SOLU-MEDROL) injection  40 mg Intravenous Q12H    Continuous Infusions:   LOS: 2 days     Kayleen Memos, MD [facility] Pager [phone]  If 7PM-7AM, please contact night-coverage [email or web address] Password [email or web address] 09/20/2021, 2:23 PM

## 2021-09-20 NOTE — Progress Notes (Signed)
Physical Therapy Treatment Patient Details Name: Deanna Petty MRN: [id number] DOB: [birth year] Today's Date: 09/20/2021   History of Present Illness Pt admitted with SOB with acute respiratory failure 2* COPD exacerbation.  Pt wtih hx of COPD, CHF, DM, and polycythemia    PT Comments    Pt has been mobilizing in room without assist.  Pt ambulated in hallway and requiring O2 as SPO2 dropped to 86% on room air during ambulation  (unable to gather Spo2 while on O2 as no pulse oximeters available).   Pt anticipates d/c home soon.   Recommendations for follow up therapy are one component of a multi-disciplinary discharge planning process, led by the attending physician.  Recommendations may be updated based on patient status, additional functional criteria and insurance authorization.  Follow Up Recommendations  Home health PT     Assistance Recommended at Discharge PRN  Equipment Recommendations  None recommended by PT    Recommendations for Other Services       Precautions / Restrictions Precautions Precautions: Fall Precaution Comments: monitor sats Restrictions Weight Bearing Restrictions: No     Mobility  Bed Mobility Overal bed mobility: Modified Independent                  Transfers Overall transfer level: Modified independent                      Ambulation/Gait Ambulation/Gait assistance: Supervision;Min guard Gait Distance (Feet): 130 Feet Assistive device: 1 person hand held assist Gait Pattern/deviations: Step-through pattern;Decreased stride length;Wide base of support Gait velocity: decr     General Gait Details: pt provided HHA (typically uses SBQC) and also occasionally grazing hand rail, SPO2 dropped to 86% on room air with ambulation so reapplied 2L O2 [place]   Stairs             Wheelchair Mobility    Modified Rankin (Stroke Patients Only)       Balance                                             Cognition Arousal/Alertness: Awake/alert Behavior During Therapy: WFL for tasks assessed/performed Overall Cognitive Status: Within Functional Limits for tasks assessed                                          Exercises      General Comments        Pertinent Vitals/Pain Pain Assessment: No/denies pain    Home Living                          Prior Function            PT Goals (current goals can now be found in the care plan section) Progress towards PT goals: Progressing toward goals    Frequency    Min 3X/week      PT Plan Current plan remains appropriate    Co-evaluation              AM-PAC PT "6 Clicks" Mobility   Outcome Measure  Help needed turning from your back to your side while in a flat bed without using bedrails?: None Help needed moving from lying on your back to sitting  on the side of a flat bed without using bedrails?: None Help needed moving to and from a bed to a chair (including a wheelchair)?: None Help needed standing up from a chair using your arms (e.g., wheelchair or bedside chair)?: None Help needed to walk in hospital room?: A Little Help needed climbing 3-5 steps with a railing? : A Little 6 Click Score: 22    End of Session Equipment Utilized During Treatment: Gait belt;Oxygen Activity Tolerance: Patient tolerated treatment well Patient left: in bed;with call bell/phone within reach Nurse Communication: Mobility status PT Visit Diagnosis: Muscle weakness (generalized) (M62.81);Difficulty in walking, not elsewhere classified (R26.2)     Time: 1007-1020 PT Time Calculation (min) (ACUTE ONLY): 13 min  Charges:  $Gait Training: 8-22 mins                     Jannette Spanner PT, DPT Acute Rehabilitation Services Pager: [phone] Office: [place] 09/20/2021, 1:35 PM

## 2021-09-21 LAB — GLUCOSE, CAPILLARY: Glucose-Capillary: 169 mg/dL — ABNORMAL HIGH (ref 70–99)

## 2021-09-21 MED ORDER — NICOTINE 21 MG/24HR TD PT24: 21.0000 mg | MEDICATED_PATCH | Freq: Every day | TRANSDERMAL | 0 refills | Status: AC

## 2021-09-21 NOTE — TOC Transition Note (Signed)
Transition of Care [facility]) - CM/SW Discharge Note   Patient Details  Name: Deanna Petty MRN: [id number] Date of Birth: April 02, 1946  [facility] CM/SW Contact:  Lynnell Catalan, RN Phone Number: 09/21/2021, 11:22 AM   Clinical Narrative:     Spoke with pt for dc planning. Pt politely declines home health services at this time. She states that someone is with her all the time at home. She states that she has a RW, cane and BSC at home currently. She denies any other home DME needs.   Final next level of care: Home/Self Care Barriers to Discharge: No Barriers Identified     Discharge Plan and Services   Discharge Planning Services: CM Consult                  Social Determinants of Health (SDOH) Interventions     Readmission Risk Interventions No flowsheet data found.

## 2021-09-21 NOTE — Progress Notes (Signed)
SATURATION QUALIFICATIONS: (This note is used to comply with regulatory documentation for home oxygen)  Patient Saturations on Room Air at Rest = 90%  Patient Saturations on Room Air while Ambulating = 92%

## 2021-09-21 NOTE — Progress Notes (Signed)
Deanna Petty to be D/C'd Home per MD order.  Discussed with the patient and all questions fully answered.  IV catheter discontinued intact. Site without signs and symptoms of complications. Dressing and pressure applied.  An After Visit Summary was printed and given to the patient. Patient prescription sent to pharmacy.  D/c education completed with patient/family including follow up instructions, medication list, d/c activities limitations if indicated, with other d/c instructions as indicated by MD - patient able to verbalize understanding, all questions fully answered.   Patient instructed to return to ED, call 911, or call MD for any changes in condition.   Patient escorted via [place], and D/C home via private auto.  Manuella Ghazi 09/21/2021 11:33 AM

## 2021-09-21 NOTE — Discharge Summary (Addendum)
Discharge Summary  Deanna Petty NLZ:[id number] DOB: [birth year]  PCP: Arthur Holms, NP  Admit date: 09/17/2021 Discharge date: 09/21/2021  Time spent: 35 minutes.  Recommendations for Outpatient Follow-up:  Follow-up with your PCP. Take your medications as prescribed Completely abstain from tobacco use Continue PT OT with assistance and fall precautions.  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    Discharge Condition: Stable  Diet recommendation: Resume previous diet.  Vitals:   09/21/21 0547 09/21/21 0802  BP: (!) 161/65   Pulse: 66   Resp: 20   Temp: 97.7 F (36.5 C)   SpO2: 97% 91%    History of present illness:  Deanna Petty is a 75 y.o. female with medical history significant of bronchitis, ongoing tobacco user, 2 PPD, COPD not on oxygen supplementation at baseline, history of pneumonia who presented to [facility] ED due to progressively worsening dyspnea associated with productive cough, wheezing and fatigue for the past week.  [name].  Work-up revealed acute COPD exacerbation with hypoxia.  CTA chest was negative for PE.    She completed 5 days of IV steroids, azithromycin, bronchodilators, received IV diuresing x1 day and was successfully weaned off oxygen supplementation.    On 09/21/2021 she passed her home oxygen evaluation with oxygen saturation 92% on room air while ambulating.     09/21/21: She was seen and examined at her bedside this morning.  There were no acute events overnight.  She  has no new complaints.  She is eager to go home.  Hospital Course:  Principal Problem:   Acute respiratory failure with hypoxia (HCC) Active Problems:   Tobacco use   Type 2 diabetes mellitus without complication (HCC)   Chronic diastolic CHF (congestive heart failure) (HCC)   Polycythemia, secondary   COPD (chronic obstructive pulmonary disease) (HCC)   Asymptomatic bacteriuria  Treated acute COPD exacerbation Received bronchodilators, IV steroids, azithromycin, pulmonary toilet Oxygen saturation 92% on room air with ambulation on 09/21/21.   Resolved acute hypoxic respiratory failure secondary to COPD exacerbation Not on oxygen supplementation at baseline Initially required 2 L to maintain O2 saturation greater than 90% She passed her home oxygen evaluation on 09/21/2021.   Elevated troponin likely demand ischemia in the setting of acute hypoxic respiratory failure Troponin peaked at 97 No evidence of acute ischemia on twelve-lead EKG done on admission She denies any anginal symptoms. 2D echo done on 09/19/2021 showed normal LVEF. LDL 55, at goal.   Type 2 diabetes with hyperglycemia, exacerbated by IV steroids Hemoglobin A1c 7.4 on 09/18/2021. Restart home regimen. Follow-up with your PCP.   Tobacco use disorder Tobacco cessation counseling done at bedside Continue tobacco cessation, nicotine patch.   Secondary polycythemia Suspected secondary to tobacco use  Follow-up with PCP   Chronic diastolic CHF Euvolemic on exam Received IV diuretics for pulmonary. No acute issues   Asymptomatic bacteremia No further action at this time Follow-up with PCP     Code Status: Full code    Consultants: None   Procedures: 2D echo  Antimicrobials: Azithromycin     Discharge Exam: BP (!) 161/65   Pulse 66   Temp 97.7 F (36.5 C) (Oral)   Resp 20   Ht 5\' 2"  (1.575 m)   Wt 81.6 kg   SpO2 91%   BMI 32.92 kg/m  General: 75 y.o. year-old female well developed well  nourished in no acute distress.  Alert and oriented x3. Cardiovascular: Regular rate and rhythm with no rubs or gallops.  No thyromegaly or JVD noted.   Respiratory: Clear to auscultation with no wheezes or rales. Good inspiratory effort. Abdomen: Soft nontender nondistended with normal bowel sounds x4 quadrants. Musculoskeletal: No lower extremity edema. 2/4 pulses in all 4 extremities. Skin: No ulcerative lesions noted or rashes, Psychiatry: Mood is appropriate for condition and setting  Discharge Instructions You were cared for by a hospitalist during your hospital stay. If you have any questions about your discharge medications or the care you received while you were in the hospital after you are discharged, you can call the unit and asked to speak with the hospitalist on call if the hospitalist that took care of you is not available. Once you are discharged, your primary care physician will handle any further medical issues. Please note that NO REFILLS for any discharge medications will be authorized once you are discharged, as it is imperative that you return to your primary care physician (or establish a relationship with a primary care physician if you do not have one) for your aftercare needs so that they can reassess your need for medications and monitor your lab values.   Allergies as of 09/21/2021   No Known Allergies      Medication List     STOP taking these medications    nicotine polacrilex 2 MG gum Commonly known as: NICORETTE   polyethylene glycol 17 g packet Commonly known as: MIRALAX / GLYCOLAX       TAKE these medications    Acetaminophen 500 MG capsule Take 500 mg by mouth every 6 (six) hours as needed for fever or pain.   albuterol 108 (90 Base) MCG/ACT inhaler Commonly known as: VENTOLIN HFA Inhale 2 puffs into the lungs every 6 (six) hours as needed for wheezing or shortness of breath.   atorvastatin 10 MG tablet Commonly known as: LIPITOR Take 10 mg  by mouth at bedtime.   cholecalciferol 25 MCG (1000 UNIT) tablet Commonly known as: VITAMIN D3 Take 1,000 Units by mouth daily.   ergocalciferol 1.25 MG (50000 UT) capsule Commonly known as: VITAMIN D2 Take 100,000 Units by mouth daily.   ibuprofen 200 MG tablet Commonly known as: ADVIL Take 400 mg by mouth every 6 (six) hours as needed for pain.   losartan 50 MG tablet Commonly known as: COZAAR Take 50 mg by mouth daily.   metFORMIN 500 MG tablet Commonly known as: Glucophage Take 1 tablet (500 mg total) by mouth 2 (two) times daily with a meal.   nicotine 21 mg/24hr patch Commonly known as: NICODERM CQ - dosed in mg/24 hours Place 1 patch (21 mg total) onto the skin daily for 28 days.   pantoprazole 40 MG tablet Commonly known as: PROTONIX Take 40 mg by mouth daily.   umeclidinium-vilanterol 62.5-25 MCG/INH Aepb Commonly known as: ANORO ELLIPTA Inhale 1 puff into the lungs daily.   Vicks VapoRub 4.7-1.2-2.6 % Oint Apply 1 application topically daily as needed (cough).       No Known Allergies  Follow-up Information     Alfonse Spruce,  Maudie [place], NP. Call today.   Specialty: Nurse Practitioner Why: Please call for a posthospital follow-up appointment. Contact information: [place] [address] [phone]                  The results of significant diagnostics from this hospitalization (including imaging, microbiology, ancillary and laboratory) are listed below for reference.    Significant Diagnostic Studies: DG Chest 2 View  Result Date: 09/18/2021 CLINICAL DATA:  Weakness, shortness of breath EXAM: CHEST - 2 VIEW COMPARISON:  CTA chest dated 07/03/2020 FINDINGS: Lungs are clear.  No pleural effusion or pneumothorax. The heart is normal in size. Degenerative changes of the visualized thoracolumbar spine. IMPRESSION: Normal chest radiographs. Electronically Signed   By: Julian Hy M.D.   On: 09/18/2021 00:26   CT Angio Chest PE W and/or Wo  Contrast  Result Date: 09/18/2021 CLINICAL DATA:  Fatigue and shortness of breath. Pulmonary embolism suspected. EXAM: CT ANGIOGRAPHY CHEST WITH CONTRAST TECHNIQUE: Multidetector CT imaging of the chest was performed using the standard protocol during bolus administration of intravenous contrast. Multiplanar CT image reconstructions and MIPs were obtained to evaluate the vascular anatomy. CONTRAST:  60mL OMNIPAQUE IOHEXOL 350 MG/ML SOLN COMPARISON:  PA and lateral chest today, portable chest 07/03/2020, and CTA chest 07/03/2020 FINDINGS: Cardiovascular: There is mild cardiomegaly with moderate patchy three-vessel calcific CAD, calcifications moderately in the posterior mitral ring and no pericardial effusion or venous dilatation. There is aortic calcification which is moderately heavy in the arch, extending into branch arteries and mild in the descending segment. There is no aortic aneurysm, ulcerative plaque or dissection, with additional calcifications in the aortic annulus. No great vessel stenosis is seen. The pulmonary arteries are normal in caliber and do not show evidence of embolic filling defects. Mediastinum/Nodes: Shotty up to borderline lymph nodes continue to be noted in the precarinal, subcarinal and left-sided prevascular mediastinum and a few borderline sized bilateral hilar nodes. There is no new or worsening adenopathy , no bulky or encasing lymph nodes. The thyroid gland, trachea and esophagus are unremarkable. Lungs/Pleura: There is asymmetric posterior basal left lower lobe increased opacity with ground-glass appearance, and additional left lower lobe infrahilar ground-glass opacity. The findings could indicate pneumonitis, atelectasis or combination. There is increasing bronchial thickening in both lower lobes as well. Remaining lungs are clear. There is no pleural effusion or pneumothorax. Upper Abdomen: There are stones in the gallbladder but no wall thickening or pericholecystic edema , no  biliary dilatation is seen. Musculoskeletal: There are spinal bridging syndesmophytes, osteopenia and mild thoracic kyphosis. No destructive osseous lesion is seen. Review of the MIP images confirms the above findings. IMPRESSION: 1. No arterial embolus or dilatation is seen , no findings of acute right heart strain. 2. Mild cardiomegaly with coronary artery and aortic atherosclerosis. 3. Stable borderline to slightly prominent lymph nodes. 4. Increased lower lobe bronchial wall thickening of bronchitis with haziness in the left lower lobe infrahilar area and posterior base which could be atelectasis or pneumonitis. Follow-up as indicated. 5. Cholelithiasis. 6. Bridging syndesmophytes in the spine. Electronically Signed   By: Telford Nab M.D.   On: 09/18/2021 06:06   ECHOCARDIOGRAM COMPLETE  Result Date: 09/19/2021    ECHOCARDIOGRAM REPORT   Patient Name:   Deanna Petty Date of Exam: 09/19/2021 Medical Rec #:  [id number]     Height:       62.0 in Accession #:    [id number]    Weight:  180.0 lb Date of Birth:  1946/02/16    BSA:          1.828 m Patient Age:    52 years      BP:           160/66 mmHg Patient Gender: F             HR:           75 bpm. Exam Location:  Inpatient Procedure: 2D Echo, Cardiac Doppler, Color Doppler and Intracardiac            Opacification Agent Indications:    Cardiomegaly, CHF, Elevated troponins  History:        Patient has prior history of Echocardiogram examinations. CHF,                 COPD; Risk Factors:Diabetes.  Sonographer:    Jyl Heinz Referring Phys: [id number] DAVID MANUEL [place]  1. Left ventricular ejection fraction, by estimation, is 70 to 75%. The left ventricle has hyperdynamic function. The left ventricle has no regional wall motion abnormalities. Left ventricular diastolic parameters are indeterminate. Elevated left ventricular end-diastolic pressure.  2. Right ventricular systolic function is normal. The right ventricular size is normal.  3.  The mitral valve is normal in structure. No evidence of mitral valve regurgitation. No evidence of mitral stenosis.  4. The aortic valve is calcified. There is mild calcification of the aortic valve. There is mild thickening of the aortic valve. Aortic valve regurgitation is not visualized. Mild aortic valve stenosis. FINDINGS  Left Ventricle: Left ventricular ejection fraction, by estimation, is 70 to 75%. The left ventricle has hyperdynamic function. The left ventricle has no regional wall motion abnormalities. The left ventricular internal cavity size was normal in size. There is no left ventricular hypertrophy. Left ventricular diastolic parameters are indeterminate. Elevated left ventricular end-diastolic pressure. Right Ventricle: The right ventricular size is normal. Right vetricular wall thickness was not well visualized. Right ventricular systolic function is normal. Left Atrium: Left atrial size was normal in size. Right Atrium: Right atrial size was normal in size. Pericardium: There is no evidence of pericardial effusion. Mitral Valve: The mitral valve is normal in structure. No evidence of mitral valve regurgitation. No evidence of mitral valve stenosis. Tricuspid Valve: The tricuspid valve is grossly normal. Tricuspid valve regurgitation is trivial. Aortic Valve: The aortic valve is calcified. There is mild calcification of the aortic valve. There is mild thickening of the aortic valve. Aortic valve regurgitation is not visualized. Mild aortic stenosis is present. Aortic valve mean gradient measures  10.7 mmHg. Aortic valve peak gradient measures 18.2 mmHg. Aortic valve area, by VTI measures 1.56 cm. Pulmonic Valve: The pulmonic valve was not well visualized. Pulmonic valve regurgitation is not visualized. Aorta: The aortic root and ascending aorta are structurally normal, with no evidence of dilitation. IAS/Shunts: The interatrial septum was not well visualized.  LEFT VENTRICLE PLAX 2D LVIDd:          3.60 cm     Diastology LVIDs:         2.60 cm     LV e' medial:    5.66 cm/s LV PW:         1.20 cm     LV E/e' medial:  20.8 LV IVS:        1.30 cm     LV e' lateral:   6.20 cm/s LVOT diam:     1.80 cm     LV E/e'  lateral: 19.0 LV SV:         73 LV SV Index:   40 LVOT Area:     2.54 cm  LV Volumes (MOD) LV vol d, MOD A2C: 86.2 ml LV vol d, MOD A4C: 79.2 ml LV vol s, MOD A2C: 27.5 ml LV vol s, MOD A4C: 25.6 ml LV SV MOD A2C:     58.7 ml LV SV MOD A4C:     79.2 ml LV SV MOD BP:      56.2 ml RIGHT VENTRICLE            IVC RV Basal diam:  3.30 cm    IVC diam: 2.20 cm RV Mid diam:    2.50 cm RV S prime:     8.16 cm/s TAPSE (M-mode): 1.6 cm LEFT ATRIUM             Index        RIGHT ATRIUM           Index LA diam:        4.00 cm 2.19 cm/m   RA Area:     14.70 cm LA Vol (A2C):   56.0 ml 30.64 ml/m  RA Volume:   35.00 ml  19.15 ml/m LA Vol (A4C):   42.4 ml 23.20 ml/m LA Biplane Vol: 49.9 ml 27.30 ml/m  AORTIC VALVE AV Area (Vmax):    1.44 cm AV Area (Vmean):   1.40 cm AV Area (VTI):     1.56 cm AV Vmax:           213.33 cm/s AV Vmean:          159.000 cm/s AV VTI:            0.468 m AV Peak Grad:      18.2 mmHg AV Mean Grad:      10.7 mmHg LVOT Vmax:         120.50 cm/s LVOT Vmean:        87.400 cm/s LVOT VTI:          0.288 m LVOT/AV VTI ratio: 0.61  AORTA Ao Root diam: 2.40 cm Ao Asc diam:  2.90 cm MITRAL VALVE MV Area (PHT): 2.80 cm     SHUNTS MV Decel Time: 271 msec     Systemic VTI:  0.29 m MV E velocity: 118.00 cm/s  Systemic Diam: 1.80 cm MV A velocity: 123.00 cm/s MV E/A ratio:  0.96 Mertie Moores MD Electronically signed by Mertie Moores MD Signature Date/Time: 09/19/2021/12:17:46 PM    Final     Microbiology: Recent Results (from the past 240 hour(s))  Resp Panel by RT-PCR (Flu A&B, Covid) Nasopharyngeal Swab     Status: None   Collection Time: 09/18/21  5:54 AM   Specimen: Nasopharyngeal Swab; Nasopharyngeal(NP) swabs in vial transport medium  Result Value Ref Range Status   SARS Coronavirus 2  by RT PCR NEGATIVE NEGATIVE Final    Comment: (NOTE) SARS-CoV-2 target nucleic acids are NOT DETECTED.  The SARS-CoV-2 RNA is generally detectable in upper respiratory specimens during the acute phase of infection. The lowest concentration of SARS-CoV-2 viral copies this assay can detect is 138 copies/mL. A negative result does not preclude SARS-Cov-2 infection and should not be used as the sole basis for treatment or other patient management decisions. A negative result may occur with  improper specimen collection/handling, submission of specimen other than nasopharyngeal swab, presence of viral mutation(s) within the areas targeted by this assay, and inadequate number of  viral copies(<138 copies/mL). A negative result must be combined with clinical observations, patient history, and epidemiological information. The expected result is Negative.  Fact Sheet for Patients:  EntrepreneurPulse.com.au  Fact Sheet for Healthcare Providers:  IncredibleEmployment.be  This test is no t yet approved or cleared by the [place] FDA and  has been authorized for detection and/or diagnosis of SARS-CoV-2 by FDA under an Emergency Use Authorization (EUA). This EUA will remain  in effect (meaning this test can be used) for the duration of the COVID-19 declaration under Section 564(b)(1) of the Act, 21 U.S.C.section 360bbb-3(b)(1), unless the authorization is terminated  or revoked sooner.       Influenza A by PCR NEGATIVE NEGATIVE Final   Influenza B by PCR NEGATIVE NEGATIVE Final    Comment: (NOTE) The Xpert Xpress SARS-CoV-2/FLU/RSV plus assay is intended as an aid in the diagnosis of influenza from Nasopharyngeal swab specimens and should not be used as a sole basis for treatment. Nasal washings and aspirates are unacceptable for Xpert Xpress SARS-CoV-2/FLU/RSV testing.  Fact Sheet for Patients: EntrepreneurPulse.com.au  Fact  Sheet for Healthcare Providers: IncredibleEmployment.be  This test is not yet approved or cleared by the [place] FDA and has been authorized for detection and/or diagnosis of SARS-CoV-2 by FDA under an Emergency Use Authorization (EUA). This EUA will remain in effect (meaning this test can be used) for the duration of the COVID-19 declaration under Section 564(b)(1) of the Act, 21 U.S.C. section 360bbb-3(b)(1), unless the authorization is terminated or revoked.  Performed at [facility], [place] [address] [place], [place] [address]      Labs: Basic Metabolic Panel: Recent Labs  Lab 09/18/21 0034  NA 137  K 4.6  CL 93*  CO2 36*  GLUCOSE 106*  BUN 15  CREATININE 0.66  CALCIUM 8.9   Liver Function Tests: No results for input(s): AST, ALT, ALKPHOS, BILITOT, PROT, ALBUMIN in the last 168 hours. No results for input(s): LIPASE, AMYLASE in the last 168 hours. No results for input(s): AMMONIA in the last 168 hours. CBC: Recent Labs  Lab 09/18/21 0034  WBC 8.9  NEUTROABS 5.5  HGB 16.8*  HCT 55.0*  MCV 99.6  PLT 184   Cardiac Enzymes: No results for input(s): CKTOTAL, CKMB, CKMBINDEX, TROPONINI in the last 168 hours. BNP: BNP (last 3 results) Recent Labs    09/18/21 0034  BNP 353.1*    ProBNP (last 3 results) No results for input(s): PROBNP in the last 8760 hours.  CBG: Recent Labs  Lab 09/20/21 0715 09/20/21 1148 09/20/21 1614 09/20/21 2216 09/21/21 0724  GLUCAP 153* 97 223* 187* 169*       Signed:  Kayleen Memos, MD [facility] 09/21/2021, 11:20 AM

## 2022-01-15 ENCOUNTER — Encounter (HOSPITAL_COMMUNITY): Payer: Self-pay

## 2022-01-15 ENCOUNTER — Inpatient Hospital Stay (HOSPITAL_COMMUNITY)
Admission: EM | Admit: 2022-01-15 | Discharge: 2022-01-20 | DRG: 190 | Disposition: A | Discharge status: Home Health | Payer: Medicare (Managed Care) | Attending: Internal Medicine | Admitting: Internal Medicine

## 2022-01-15 ENCOUNTER — Emergency Department (HOSPITAL_COMMUNITY): Payer: Medicare (Managed Care)

## 2022-01-15 ENCOUNTER — Other Ambulatory Visit: Payer: Self-pay

## 2022-01-15 DIAGNOSIS — I4891 Unspecified atrial fibrillation: Secondary | ICD-10-CM | POA: Diagnosis not present

## 2022-01-15 DIAGNOSIS — Z20822 Contact with and (suspected) exposure to covid-19: Secondary | ICD-10-CM | POA: Diagnosis present

## 2022-01-15 DIAGNOSIS — Z720 Tobacco use: Secondary | ICD-10-CM | POA: Diagnosis present

## 2022-01-15 DIAGNOSIS — I5032 Chronic diastolic (congestive) heart failure: Secondary | ICD-10-CM | POA: Diagnosis not present

## 2022-01-15 DIAGNOSIS — F17210 Nicotine dependence, cigarettes, uncomplicated: Secondary | ICD-10-CM | POA: Diagnosis present

## 2022-01-15 DIAGNOSIS — I5033 Acute on chronic diastolic (congestive) heart failure: Secondary | ICD-10-CM | POA: Diagnosis present

## 2022-01-15 DIAGNOSIS — J189 Pneumonia, unspecified organism: Secondary | ICD-10-CM

## 2022-01-15 DIAGNOSIS — Z7984 Long term (current) use of oral hypoglycemic drugs: Secondary | ICD-10-CM | POA: Diagnosis not present

## 2022-01-15 DIAGNOSIS — J9601 Acute respiratory failure with hypoxia: Secondary | ICD-10-CM | POA: Diagnosis present

## 2022-01-15 DIAGNOSIS — Z90710 Acquired absence of both cervix and uterus: Secondary | ICD-10-CM

## 2022-01-15 DIAGNOSIS — I10 Essential (primary) hypertension: Secondary | ICD-10-CM

## 2022-01-15 DIAGNOSIS — E875 Hyperkalemia: Secondary | ICD-10-CM | POA: Diagnosis not present

## 2022-01-15 DIAGNOSIS — E11649 Type 2 diabetes mellitus with hypoglycemia without coma: Secondary | ICD-10-CM | POA: Diagnosis present

## 2022-01-15 DIAGNOSIS — J441 Chronic obstructive pulmonary disease with (acute) exacerbation: Principal | ICD-10-CM | POA: Diagnosis present

## 2022-01-15 DIAGNOSIS — E669 Obesity, unspecified: Secondary | ICD-10-CM | POA: Diagnosis present

## 2022-01-15 DIAGNOSIS — I350 Nonrheumatic aortic (valve) stenosis: Secondary | ICD-10-CM | POA: Diagnosis present

## 2022-01-15 DIAGNOSIS — Z6834 Body mass index (BMI) 34.0-34.9, adult: Secondary | ICD-10-CM

## 2022-01-15 DIAGNOSIS — J9602 Acute respiratory failure with hypercapnia: Secondary | ICD-10-CM | POA: Diagnosis present

## 2022-01-15 DIAGNOSIS — E119 Type 2 diabetes mellitus without complications: Secondary | ICD-10-CM | POA: Diagnosis not present

## 2022-01-15 DIAGNOSIS — Z794 Long term (current) use of insulin: Secondary | ICD-10-CM

## 2022-01-15 DIAGNOSIS — Z716 Tobacco abuse counseling: Secondary | ICD-10-CM | POA: Diagnosis not present

## 2022-01-15 DIAGNOSIS — E785 Hyperlipidemia, unspecified: Secondary | ICD-10-CM | POA: Diagnosis not present

## 2022-01-15 DIAGNOSIS — Z79899 Other long term (current) drug therapy: Secondary | ICD-10-CM

## 2022-01-15 DIAGNOSIS — T380X5A Adverse effect of glucocorticoids and synthetic analogues, initial encounter: Secondary | ICD-10-CM | POA: Diagnosis not present

## 2022-01-15 DIAGNOSIS — E8729 Other acidosis: Secondary | ICD-10-CM | POA: Diagnosis present

## 2022-01-15 DIAGNOSIS — R9431 Abnormal electrocardiogram [ECG] [EKG]: Secondary | ICD-10-CM | POA: Diagnosis present

## 2022-01-15 DIAGNOSIS — I110 Hypertensive heart disease with heart failure: Secondary | ICD-10-CM | POA: Diagnosis present

## 2022-01-15 DIAGNOSIS — E1165 Type 2 diabetes mellitus with hyperglycemia: Secondary | ICD-10-CM | POA: Diagnosis not present

## 2022-01-15 DIAGNOSIS — E1169 Type 2 diabetes mellitus with other specified complication: Secondary | ICD-10-CM | POA: Diagnosis present

## 2022-01-15 DIAGNOSIS — Z833 Family history of diabetes mellitus: Secondary | ICD-10-CM | POA: Diagnosis not present

## 2022-01-15 HISTORY — DX: Essential (primary) hypertension: I10

## 2022-01-15 HISTORY — DX: Chronic obstructive pulmonary disease with (acute) exacerbation: J44.1

## 2022-01-15 HISTORY — DX: Pneumonia, unspecified organism: J18.9

## 2022-01-15 LAB — CBC WITH DIFFERENTIAL/PLATELET
Abs Immature Granulocytes: 0.11 10*3/uL — ABNORMAL HIGH (ref 0.00–0.07)
Abs Immature Granulocytes: 0.1 10*3/uL — ABNORMAL HIGH (ref 0.00–0.07)
Basophils Absolute: 0.1 10*3/uL (ref 0.0–0.1)
Basophils Absolute: 0.1 10*3/uL (ref 0.0–0.1)
Basophils Relative: 1 %
Basophils Relative: 1 %
Eosinophils Absolute: 0.2 10*3/uL (ref 0.0–0.5)
Eosinophils Absolute: 0 10*3/uL (ref 0.0–0.5)
Eosinophils Relative: 2 %
Eosinophils Relative: 0 %
HCT: 50 % — ABNORMAL HIGH (ref 36.0–46.0)
HCT: 43.6 % (ref 36.0–46.0)
Hemoglobin: 15.3 g/dL — ABNORMAL HIGH (ref 12.0–15.0)
Hemoglobin: 13.6 g/dL (ref 12.0–15.0)
Immature Granulocytes: 1 %
Immature Granulocytes: 1 %
Lymphocytes Relative: 22 %
Lymphocytes Relative: 5 %
Lymphs Abs: 2.6 10*3/uL (ref 0.7–4.0)
Lymphs Abs: 0.6 10*3/uL — ABNORMAL LOW (ref 0.7–4.0)
MCH: 30.5 pg (ref 26.0–34.0)
MCH: 31.3 pg (ref 26.0–34.0)
MCHC: 30.6 g/dL (ref 30.0–36.0)
MCHC: 31.2 g/dL (ref 30.0–36.0)
MCV: 99.6 fL (ref 80.0–100.0)
MCV: 100.2 fL — ABNORMAL HIGH (ref 80.0–100.0)
Monocytes Absolute: 1.2 10*3/uL — ABNORMAL HIGH (ref 0.1–1.0)
Monocytes Absolute: 0.2 10*3/uL (ref 0.1–1.0)
Monocytes Relative: 10 %
Monocytes Relative: 2 %
Neutro Abs: 7.7 10*3/uL (ref 1.7–7.7)
Neutro Abs: 9.4 10*3/uL — ABNORMAL HIGH (ref 1.7–7.7)
Neutrophils Relative %: 64 %
Neutrophils Relative %: 91 %
Platelets: 171 10*3/uL (ref 150–400)
Platelets: 133 10*3/uL — ABNORMAL LOW (ref 150–400)
RBC: 5.02 MIL/uL (ref 3.87–5.11)
RBC: 4.35 MIL/uL (ref 3.87–5.11)
RDW: 15.9 % — ABNORMAL HIGH (ref 11.5–15.5)
RDW: 16.7 % — ABNORMAL HIGH (ref 11.5–15.5)
WBC: 11.9 10*3/uL — ABNORMAL HIGH (ref 4.0–10.5)
WBC: 10.3 10*3/uL (ref 4.0–10.5)
nRBC: 0 % (ref 0.0–0.2)
nRBC: 0 % (ref 0.0–0.2)

## 2022-01-15 LAB — RESP PANEL BY RT-PCR (FLU A&B, COVID) ARPGX2
Influenza A by PCR: NEGATIVE
Influenza B by PCR: NEGATIVE
SARS Coronavirus 2 by RT PCR: NEGATIVE

## 2022-01-15 LAB — I-STAT ARTERIAL BLOOD GAS, ED
Acid-Base Excess: 3 mmol/L — ABNORMAL HIGH (ref 0.0–2.0)
Bicarbonate: 33.3 mmol/L — ABNORMAL HIGH (ref 20.0–28.0)
Calcium, Ion: 1.37 mmol/L (ref 1.15–1.40)
HCT: 48 % — ABNORMAL HIGH (ref 36.0–46.0)
Hemoglobin: 16.3 g/dL — ABNORMAL HIGH (ref 12.0–15.0)
O2 Saturation: 84 %
Potassium: 4.6 mmol/L (ref 3.5–5.1)
Sodium: 136 mmol/L (ref 135–145)
TCO2: 36 mmol/L — ABNORMAL HIGH (ref 22–32)
pCO2 arterial: 77.8 mmHg (ref 32–48)
pH, Arterial: 7.239 — ABNORMAL LOW (ref 7.35–7.45)
pO2, Arterial: 59 mmHg — ABNORMAL LOW (ref 83–108)

## 2022-01-15 LAB — BASIC METABOLIC PANEL
Anion gap: 6 (ref 5–15)
BUN: 8 mg/dL (ref 8–23)
CO2: 33 mmol/L — ABNORMAL HIGH (ref 22–32)
Calcium: 9.7 mg/dL (ref 8.9–10.3)
Chloride: 98 mmol/L (ref 98–111)
Creatinine, Ser: 0.7 mg/dL (ref 0.44–1.00)
GFR, Estimated: >60 mL/min (ref >60)
Glucose, Bld: 149 mg/dL — ABNORMAL HIGH (ref 70–99)
Potassium: 4.4 mmol/L (ref 3.5–5.1)
Sodium: 137 mmol/L (ref 135–145)

## 2022-01-15 LAB — HEPATIC FUNCTION PANEL
ALT: 46 U/L — ABNORMAL HIGH (ref 0–44)
AST: 54 U/L — ABNORMAL HIGH (ref 15–41)
Albumin: 3.7 g/dL (ref 3.5–5.0)
Alkaline Phosphatase: 86 U/L (ref 38–126)
Bilirubin, Direct: 0.3 mg/dL — ABNORMAL HIGH (ref 0.0–0.2)
Indirect Bilirubin: 0.6 mg/dL (ref 0.3–0.9)
Total Bilirubin: 0.9 mg/dL (ref 0.3–1.2)
Total Protein: 7.8 g/dL (ref 6.5–8.1)

## 2022-01-15 LAB — TSH: TSH: 0.901 u[IU]/mL (ref 0.350–4.500)

## 2022-01-15 LAB — LACTIC ACID, PLASMA
Lactic Acid, Venous: 1.5 mmol/L (ref 0.5–1.9)
Lactic Acid, Venous: 1.9 mmol/L (ref 0.5–1.9)

## 2022-01-15 LAB — BRAIN NATRIURETIC PEPTIDE: B Natriuretic Peptide: 380.1 pg/mL — ABNORMAL HIGH (ref 0.0–100.0)

## 2022-01-15 LAB — PHOSPHORUS: Phosphorus: 4.3 mg/dL (ref 2.5–4.6)

## 2022-01-15 LAB — MAGNESIUM: Magnesium: 1.7 mg/dL (ref 1.7–2.4)

## 2022-01-15 LAB — CK: Total CK: 67 U/L (ref 38–234)

## 2022-01-15 LAB — CBG MONITORING, ED: Glucose-Capillary: 150 mg/dL — ABNORMAL HIGH (ref 70–99)

## 2022-01-15 MED ORDER — NICOTINE 14 MG/24HR TD PT24
14.0000 mg | MEDICATED_PATCH | Freq: Every day | TRANSDERMAL | Status: DC
  Administered 2022-01-16 – 2022-01-20 (×5): 14 mg via TRANSDERMAL
  Filled 2022-01-15 (×5): qty 1

## 2022-01-15 MED ORDER — AZITHROMYCIN 500 MG IV SOLR
500.0000 mg | Freq: Once | INTRAVENOUS | Status: AC
  Administered 2022-01-15: 500 mg via INTRAVENOUS
  Filled 2022-01-15 (×2): qty 5

## 2022-01-15 MED ORDER — ACETAMINOPHEN 650 MG RE SUPP: 650.0000 mg | Freq: Four times a day (QID) | RECTAL | Status: DC | PRN

## 2022-01-15 MED ORDER — HYDROCODONE-ACETAMINOPHEN 5-325 MG PO TABS: 1.0000 | ORAL_TABLET | ORAL | Status: DC | PRN

## 2022-01-15 MED ORDER — ALBUTEROL SULFATE (2.5 MG/3ML) 0.083% IN NEBU
2.5000 mg | INHALATION_SOLUTION | RESPIRATORY_TRACT | Status: DC | PRN
  Administered 2022-01-16: 2.5 mg via RESPIRATORY_TRACT
  Filled 2022-01-15: qty 3

## 2022-01-15 MED ORDER — ACETAMINOPHEN 325 MG PO TABS: 650.0000 mg | ORAL_TABLET | Freq: Four times a day (QID) | ORAL | Status: DC | PRN

## 2022-01-15 MED ORDER — SODIUM CHLORIDE 0.9 % IV SOLN
2.0000 g | INTRAVENOUS | Status: DC
  Administered 2022-01-16 – 2022-01-18 (×3): 2 g via INTRAVENOUS
  Filled 2022-01-15 (×3): qty 20

## 2022-01-15 MED ORDER — SODIUM CHLORIDE 0.9 % IV SOLN
1.0000 g | Freq: Once | INTRAVENOUS | Status: AC
  Administered 2022-01-15: 1 g via INTRAVENOUS
  Filled 2022-01-15: qty 10

## 2022-01-15 MED ORDER — SODIUM CHLORIDE 0.9% FLUSH
3.0000 mL | Freq: Two times a day (BID) | INTRAVENOUS | Status: DC
  Administered 2022-01-16 – 2022-01-19 (×8): 3 mL via INTRAVENOUS

## 2022-01-15 MED ORDER — INSULIN ASPART 100 UNIT/ML IJ SOLN
0.0000 [IU] | INTRAMUSCULAR | Status: DC
  Administered 2022-01-15: 2 [IU] via SUBCUTANEOUS
  Administered 2022-01-16: 3 [IU] via SUBCUTANEOUS
  Administered 2022-01-16: 5 [IU] via SUBCUTANEOUS

## 2022-01-15 MED ORDER — AMLODIPINE BESYLATE 10 MG PO TABS
10.0000 mg | ORAL_TABLET | Freq: Every day | ORAL | Status: DC
  Administered 2022-01-16 – 2022-01-17 (×2): 10 mg via ORAL
  Filled 2022-01-15 (×2): qty 1

## 2022-01-15 MED ORDER — ENOXAPARIN SODIUM 40 MG/0.4ML IJ SOSY
40.0000 mg | PREFILLED_SYRINGE | Freq: Every day | INTRAMUSCULAR | Status: DC
  Administered 2022-01-15 – 2022-01-19 (×5): 40 mg via SUBCUTANEOUS
  Filled 2022-01-15 (×5): qty 0.4

## 2022-01-15 MED ORDER — IPRATROPIUM-ALBUTEROL 0.5-2.5 (3) MG/3ML IN SOLN
3.0000 mL | Freq: Four times a day (QID) | RESPIRATORY_TRACT | Status: DC
  Administered 2022-01-16 – 2022-01-19 (×14): 3 mL via RESPIRATORY_TRACT
  Filled 2022-01-15 (×12): qty 3

## 2022-01-15 MED ORDER — SODIUM CHLORIDE 0.9 % IV SOLN
500.0000 mg | INTRAVENOUS | Status: DC
  Administered 2022-01-16: 500 mg via INTRAVENOUS
  Filled 2022-01-15 (×2): qty 5

## 2022-01-15 MED ORDER — PREDNISONE 20 MG PO TABS: 40.0000 mg | ORAL_TABLET | Freq: Every day | ORAL | Status: DC

## 2022-01-15 MED ORDER — ATORVASTATIN CALCIUM 10 MG PO TABS
10.0000 mg | ORAL_TABLET | Freq: Every day | ORAL | Status: DC
  Administered 2022-01-15 – 2022-01-19 (×5): 10 mg via ORAL
  Filled 2022-01-15 (×5): qty 1

## 2022-01-15 MED ORDER — METHYLPREDNISOLONE SODIUM SUCC 40 MG IJ SOLR
40.0000 mg | Freq: Two times a day (BID) | INTRAMUSCULAR | Status: DC
  Administered 2022-01-16: 40 mg via INTRAVENOUS
  Filled 2022-01-15: qty 1

## 2022-01-15 MED ORDER — METOPROLOL SUCCINATE ER 25 MG PO TB24
25.0000 mg | ORAL_TABLET | Freq: Every day | ORAL | Status: DC
  Administered 2022-01-16 – 2022-01-20 (×5): 25 mg via ORAL
  Filled 2022-01-15 (×5): qty 1

## 2022-01-15 MED ORDER — PANTOPRAZOLE SODIUM 40 MG PO TBEC
40.0000 mg | DELAYED_RELEASE_TABLET | Freq: Every day | ORAL | Status: DC
Start: 2022-01-16 — End: 2022-01-20
  Administered 2022-01-16 – 2022-01-20 (×5): 40 mg via ORAL
  Filled 2022-01-15 (×5): qty 1

## 2022-01-15 MED ORDER — GUAIFENESIN ER 600 MG PO TB12
600.0000 mg | ORAL_TABLET | Freq: Two times a day (BID) | ORAL | Status: DC
  Administered 2022-01-15: 600 mg via ORAL
  Filled 2022-01-15: qty 1

## 2022-01-15 MED ORDER — ALBUTEROL SULFATE (2.5 MG/3ML) 0.083% IN NEBU
5.0000 mg | INHALATION_SOLUTION | Freq: Once | RESPIRATORY_TRACT | Status: AC
  Administered 2022-01-15: 5 mg via RESPIRATORY_TRACT
  Filled 2022-01-15: qty 6

## 2022-01-15 MED ORDER — SODIUM CHLORIDE 0.9 % IV SOLN
250.0000 mL | INTRAVENOUS | Status: DC | PRN
  Administered 2022-01-17: 250 mL via INTRAVENOUS

## 2022-01-15 MED ORDER — SODIUM CHLORIDE 0.9% FLUSH: 3.0000 mL | INTRAVENOUS | Status: DC | PRN

## 2022-01-15 NOTE — H&P (Signed)
? ? Deanna Petty IRW:[id number] DOB: April 12, 1946 DOA: 01/15/2022 ?  ?PCP: Arthur Holms, NP   ?Outpatient Specialists:   ?   ?Pulmonary  Dr. Vaughan Browner ?   ?Patient arrived to ER on 01/15/22 at 1711 ?Referred by Attending Isla Pence, MD ? ? ?Patient coming from:   ? home Lives  With family ?  ? ?Chief Complaint:   ?Chief Complaint  ?Patient presents with  ? Shortness of Breath  ? ? ?HPI: ?ROSILYN Petty is a 76 y.o. female with medical history significant of  COPD, not on O2, tobacco abuse, DM2,  ?Chronic diastolic CHF ? ?Presented with   severe shortness of breath cough and wheezing ?Came from home complaining of cough shortness of breath thought to have COPD exacerbation worse since last night.  On EMS arrival to home O2 sat 63% on room air.  Patient started nonrebreather when was decreased down to 4 L he dropped to 80%. ?Per EMS received albuterol Atrovent and 125 mg of Solu-Medrol and some relief.  At baseline not on oxygen. ?Patient continues to smoke. ?Last admission for the same in December 2022 ? States the nicotine patches oTC breask her out but she can use those in the  hospital ?Non productive cough no CP ? ? Initial COVID TEST  ?NEGATIVE  ? ?Lab Results  ?Component Value Date  ? [place] NEGATIVE 01/15/2022  ? [place] NEGATIVE 09/18/2021  ? [place] NEGATIVE 07/03/2020  ? ?  ?Regarding pertinent Chronic problems:   ? ? Hyperlipidemia -  on statins Lipitor ?Lipid Panel  ?   ?Component Value Date/Time  ? CHOL 110 09/20/2021 0509  ? TRIG 55 09/20/2021 0509  ? HDL 44 09/20/2021 0509  ? CHOLHDL 2.5 09/20/2021 0509  ? VLDL 11 09/20/2021 0509  ? [place] 55 09/20/2021 0509  ? ? ? HTN on losartan ? ? chronic CHF diastolic - last echo December 2022 EF 70-75 elevated left ventricular end-diastolic pressure ?  ? ?  DM 2 -  ?Lab Results  ?Component Value Date  ? HGBA1C 7.4 (H) 09/18/2021  ? PO meds only,   ?  ? ?  obesity-   ?BMI Readings from Last 1 Encounters:  ?09/17/21 32.92 kg/m?  ?  ?  COPD - not  followed  by pulmonology   not  on baseline oxygen      ?  ?  ?While in ER: ?  ?Noted to have hypercarbia started on BiPAP and improving chest x-ray worrisome for pneumonia started on Rocephin and azithromycin ?  ? ?Ordered ?  ? ?CXR - ? PNA ?  ?Following Medications were ordered in ER: ?Medications  ?cefTRIAXone (ROCEPHIN) 1 g in sodium chloride 0.9 % 100 mL IVPB (has no administration in time range)  ?azithromycin (ZITHROMAX) 500 mg in sodium chloride 0.9 % 250 mL IVPB (has no administration in time range)  ?albuterol (PROVENTIL) (2.5 MG/3ML) 0.083% nebulizer solution 5 mg (5 mg Nebulization Given 01/15/22 1732)  ?   ?  ?ED Triage Vitals  ?Enc Vitals Group  ?   BP 01/15/22 1745 (!) 125/104  ?   Pulse Rate 01/15/22 1745 97  ?   Resp 01/15/22 1745 (!) 23  ?   Temp 01/15/22 1923 99.6 ?F (37.6 ?C)  ?   Temp Source 01/15/22 1923 Axillary  ?   SpO2 01/15/22 1730 92 %  ?   Weight --   ?   Height --   ?   Head Circumference --   ?  Peak Flow --   ?   Pain Score 01/15/22 1923 0  ?   Pain Loc --   ?   Pain Edu? --   ?   Excl. in [place]? --   ?EXNT(70)@    ? _________________________________________ ?Significant initial  Findings: ?Abnormal Labs Reviewed  ?BASIC METABOLIC PANEL - Abnormal; Notable for the following components:  ?    Result Value  ? CO2 33 (*)   ? Glucose, Bld 149 (*)   ? All other components within normal limits  ?CBC WITH DIFFERENTIAL/PLATELET - Abnormal; Notable for the following components:  ? WBC 11.9 (*)   ? Hemoglobin 15.3 (*)   ? HCT 50.0 (*)   ? RDW 15.9 (*)   ? Monocytes Absolute 1.2 (*)   ? Abs Immature Granulocytes 0.11 (*)   ? All other components within normal limits  ?BRAIN NATRIURETIC PEPTIDE - Abnormal; Notable for the following components:  ? B Natriuretic Peptide 380.1 (*)   ? All other components within normal limits  ?I-STAT ARTERIAL BLOOD GAS, ED - Abnormal; Notable for the following components:  ? pH, Arterial 7.239 (*)   ? pCO2 arterial 77.8 (*)   ? pO2, Arterial 59 (*)   ? Bicarbonate 33.3 (*)   ?  TCO2 36 (*)   ? Acid-Base Excess 3.0 (*)   ? HCT 48.0 (*)   ? Hemoglobin 16.3 (*)   ? All other components within normal limits  ? ?  ?ECG: Ordered ?Personally reviewed by me showing: ?HR : 92 ?Rhythm: Sinus rhythm ?Right axis deviation ?Abnormal T, consider ischemia, diffuse leads ?No significant change since last tracing ?QTC 427 ?  ? ?The recent clinical data is shown below. ?Vitals:  ? 01/15/22 1923 01/15/22 1923 01/15/22 1924 01/15/22 1930  ?BP:      ?Pulse: 88   85  ?Resp: (!) 33   (!) 26  ?Temp:  99.6 ?F (37.6 ?C)    ?TempSrc:  Axillary    ?SpO2: 100%  100% 100%  ?  ?WBC ? ?   ?Component Value Date/Time  ? WBC 11.9 (H) 01/15/2022 1721  ? LYMPHSABS 2.6 01/15/2022 1721  ? MONOABS 1.2 (H) 01/15/2022 1721  ? EOSABS 0.2 01/15/2022 1721  ? BASOSABS 0.1 01/15/2022 1721  ? ?  ?Lactic Acid, Venous ?   ?Component Value Date/Time  ? LATICACIDVEN 1.5 01/15/2022 1810  ?  ?Procalcitonin  Ordered ?  ?   ?Results for orders placed or performed during the hospital encounter of 01/15/22  ?Resp Panel by RT-PCR (Flu A&B, Covid) Nasopharyngeal Swab     Status: None  ? Collection Time: 01/15/22  6:52 PM  ? Specimen: Nasopharyngeal Swab; Nasopharyngeal(NP) swabs in vial transport medium  ?Result Value Ref Range Status  ? SARS Coronavirus 2 by RT PCR NEGATIVE NEGATIVE Final  ?      ? Influenza A by PCR NEGATIVE NEGATIVE Final  ? Influenza B by PCR NEGATIVE NEGATIVE Final  ?      ?  ?_______________________________________________ ?Hospitalist was called for admission for   COPD exacerbation ([place])   ?Community acquired pneumonia of right lower lobe of lung  ?Acute respiratory failure with hypoxia and hypercapnia (HCC) ?   ?The following Work up has been ordered so far: ? ?Orders Placed This Encounter  ?Procedures  ? Resp Panel by RT-PCR (Flu A&B, Covid) Nasopharyngeal Swab  ? Culture, blood (routine x 2)  ? DG Chest Port 1 View  ? Basic metabolic panel  ? CBC  with Differential  ? Brain natriuretic peptide  ? Lactic acid, plasma  ?  Cardiac monitoring  ? Consult for [place] Admission  ? Pulse oximetry (single)  ? Bipap  ? I-Stat arterial blood gas, [facility] ED)  ? ED EKG  ? Insert peripheral IV  ?  ? ?OTHER Significant initial  Findings: ? ?labs showing:  ?Recent Labs  ?Lab 01/15/22 ?1721 01/15/22 ?1827  ?NA 137 136  ?K 4.4 4.6  ?CO2 33*  --   ?GLUCOSE 149*  --   ?BUN 8  --   ?CREATININE 0.70  --   ?CALCIUM 9.7  --   ? ? ?Cr   stable,    ?Lab Results  ?Component Value Date  ? CREATININE 0.70 01/15/2022  ? CREATININE 0.66 09/18/2021  ? CREATININE 0.91 08/05/2020  ? ? ?Recent Labs  ?Lab 01/15/22 ?1721  ?AST 54*  ?ALT 46*  ?ALKPHOS 86  ?BILITOT 0.9  ?PROT 7.8  ?ALBUMIN 3.7  ? ?Lab Results  ?Component Value Date  ? CALCIUM 9.7 01/15/2022  ? PHOS 4.3 01/15/2022  ?  ?Plt: ?Lab Results  ?Component Value Date  ? PLT 171 01/15/2022  ? ?  ?COVID-19 Labs ? ?No results for input(s): DDIMER, FERRITIN, LDH, CRP in the last 72 hours. ? ?Lab Results  ?Component Value Date  ? [place] NEGATIVE 01/15/2022  ? [place] NEGATIVE 09/18/2021  ? [place] NEGATIVE 07/03/2020  ?  ? ?ABG ?   ?Component Value Date/Time  ? PHART 7.239 (L) 01/15/2022 1827  ? PCO2ART 77.8 ([place]) 01/15/2022 1827  ? PO2ART 59 (L) 01/15/2022 1827  ? HCO3 33.3 (H) 01/15/2022 1827  ? TCO2 36 (H) 01/15/2022 1827  ? O2SAT 84 01/15/2022 1827  ? ?   ?Recent Labs  ?Lab 01/15/22 ?1721 01/15/22 ?1827  ?WBC 11.9*  --   ?NEUTROABS 7.7  --   ?HGB 15.3* 16.3*  ?HCT 50.0* 48.0*  ?MCV 99.6  --   ?PLT 171  --   ? ? ?HG/HCT   stable,  ?   ?Component Value Date/Time  ? HGB 16.3 (H) 01/15/2022 1827  ? HCT 48.0 (H) 01/15/2022 1827  ? MCV 99.6 01/15/2022 1721  ? ?     ?Cardiac Panel (last 3 results) ?Recent Labs  ?  01/15/22 ?1721  ?CKTOTAL 67  ? ? .car ?BNP (last 3 results) ?Recent Labs  ?  09/18/21 ?3435 01/15/22 ?1721  ?BNP 353.1* 380.1*  ?  ? ?DM  labs:  ?HbA1C: ?Recent Labs  ?  09/18/21 ?1255  ?HGBA1C 7.4*  ? ?  ?  ?CBG (last 3)  ?No results for input(s): GLUCAP in the last 72 hours. ?  ?   ?Cultures: ?No results found for: SDES, [place], [place], REPTSTATUS ?  ?Radiological Exams on Admission: ?DG Chest Port 1 View ? ?Result Date: 01/15/2022 ?CLINICAL DATA:  Shortness of breath. Complains of COPD

## 2022-01-15 NOTE — Assessment & Plan Note (Signed)
Restart home meds if BP allows ?Toprolol, norvasc, hold cozaar for tonight ? ?

## 2022-01-15 NOTE — ED Notes (Signed)
RT at bedside.

## 2022-01-15 NOTE — ED Provider Notes (Signed)
?[place] ?Provider Note ? ? ?CSN: [id number] ?Arrival date & time: 01/15/22  1711 ? ?  ? ?History ? ?Chief Complaint  ?Patient presents with  ? Shortness of Breath  ? ? ?Deanna Petty is a 76 y.o. female. ? ?Pt is a 76 yo female with a pmhx significant for COPD (not normally on oxygen), htn, and dm.  Pt said she started getting sob yesterday.  Pt did not sleep well and was still sob today, so she called EMS.  When they arrived, her O2 sat was 63%.  They put her on a nrb and O2 sat went up to 94%.  They took her off the nrb and put her on 4L and O2 dropped back to 80%, so they put her back on the NRB.  Pt was given 5 mg albuterol and 0.5 mg atrovent + 125 mg solumedrol en route by EMS.  Pt is feeling a little better after the nebs.  Pt denies f/c.  Pt taken off the NRB here and transitioned to a [place].  Initially, O2 sat dropped to the mid-80s.  However, we changed the location of the pulse ox and now on 4L, her O2 sat is in the low 90s. ? ? ?  ? ?Home Medications ?Prior to Admission medications   ?Medication Sig Start Date End Date Taking? Authorizing Provider  ?Acetaminophen 500 MG capsule Take 500 mg by mouth every 6 (six) hours as needed for fever or pain.    [provider]  ?albuterol (VENTOLIN HFA) 108 (90 Base) MCG/ACT inhaler Inhale 2 puffs into the lungs every 6 (six) hours as needed for wheezing or shortness of breath. 07/07/20   Guilford Shi, MD  ?atorvastatin (LIPITOR) 10 MG tablet Take 10 mg by mouth at bedtime. 07/29/21   [provider]  ?Camphor-Eucalyptus-Menthol (VICKS VAPORUB) 4.7-1.2-2.6 % OINT Apply 1 application topically daily as needed (cough).    [provider]  ?cholecalciferol (VITAMIN D3) 25 MCG (1000 UNIT) tablet Take 1,000 Units by mouth daily.    [provider]  ?ergocalciferol (VITAMIN D2) 1.25 MG (50000 UT) capsule Take 100,000 Units by mouth daily.    [provider]  ?ibuprofen (ADVIL,MOTRIN)  200 MG tablet Take 400 mg by mouth every 6 (six) hours as needed for pain.    [provider]  ?losartan (COZAAR) 50 MG tablet Take 50 mg by mouth daily. 07/28/21   [provider]  ?metFORMIN (GLUCOPHAGE) 500 MG tablet Take 1 tablet (500 mg total) by mouth 2 (two) times daily with a meal. 07/07/20 09/18/21  Guilford Shi, MD  ?pantoprazole (PROTONIX) 40 MG tablet Take 40 mg by mouth daily. 07/29/21   [provider]  ?umeclidinium-vilanterol (ANORO ELLIPTA) 62.5-25 MCG/INH AEPB Inhale 1 puff into the lungs daily. 07/07/20   Guilford Shi, MD  ?   ? ?Allergies    ?Patient has no known allergies.   ? ?Review of Systems   ?Review of Systems  ?Respiratory:  Positive for cough, shortness of breath and wheezing.   ?All other systems reviewed and are negative. ? ?Physical Exam ?Updated Vital Signs ?BP (!) 130/46   Pulse 85   Temp 99.6 ?F (37.6 ?C) (Axillary)   Resp (!) 26   SpO2 100%  ?Physical Exam ?Vitals and nursing note reviewed.  ?Constitutional:   ?   General: She is in acute distress.  ?   Appearance: She is well-developed.  ?HENT:  ?   Head: Normocephalic and atraumatic.  ?  Mouth/Throat:  ?   Mouth: Mucous membranes are dry.  ?Eyes:  ?   Extraocular Movements: Extraocular movements intact.  ?   Pupils: Pupils are equal, round, and reactive to light.  ?Cardiovascular:  ?   Rate and Rhythm: Regular rhythm. Tachycardia present.  ?Pulmonary:  ?   Effort: Tachypnea and respiratory distress present.  ?   Breath sounds: Wheezing present.  ?Abdominal:  ?   General: Bowel sounds are normal.  ?   Palpations: Abdomen is soft.  ?Musculoskeletal:     ?   General: Normal range of motion.  ?   Cervical back: Normal range of motion and neck supple.  ?Skin: ?   General: Skin is warm.  ?   Capillary Refill: Capillary refill takes less than 2 seconds.  ?Neurological:  ?   General: No focal deficit present.  ?   Mental Status: She is alert and oriented to person, place, and time.  ?Psychiatric:      ?   Mood and Affect: Mood normal.     ?   Behavior: Behavior normal.  ? ? ?ED Results / Procedures / Treatments   ?Labs ?(all labs ordered are listed, but only abnormal results are displayed) ?Labs Reviewed  ?BASIC METABOLIC PANEL - Abnormal; Notable for the following components:  ?    Result Value  ? CO2 33 (*)   ? Glucose, Bld 149 (*)   ? All other components within normal limits  ?CBC WITH DIFFERENTIAL/PLATELET - Abnormal; Notable for the following components:  ? WBC 11.9 (*)   ? Hemoglobin 15.3 (*)   ? HCT 50.0 (*)   ? RDW 15.9 (*)   ? Monocytes Absolute 1.2 (*)   ? Abs Immature Granulocytes 0.11 (*)   ? All other components within normal limits  ?BRAIN NATRIURETIC PEPTIDE - Abnormal; Notable for the following components:  ? B Natriuretic Peptide 380.1 (*)   ? All other components within normal limits  ?I-STAT ARTERIAL BLOOD GAS, ED - Abnormal; Notable for the following components:  ? pH, Arterial 7.239 (*)   ? pCO2 arterial 77.8 (*)   ? pO2, Arterial 59 (*)   ? Bicarbonate 33.3 (*)   ? TCO2 36 (*)   ? Acid-Base Excess 3.0 (*)   ? HCT 48.0 (*)   ? Hemoglobin 16.3 (*)   ? All other components within normal limits  ?RESP PANEL BY RT-PCR (FLU A&B, COVID) [id number]  ?CULTURE, BLOOD (ROUTINE X 2)  ?CULTURE, BLOOD (ROUTINE X 2)  ?LACTIC ACID, PLASMA  ?LACTIC ACID, PLASMA  ?HEMOGLOBIN A1C  ?CK  ?DIFFERENTIAL  ?HEPATIC FUNCTION PANEL  ?MAGNESIUM  ?PHOSPHORUS  ?TSH  ? ? ?EKG ?EKG Interpretation ? ?Date/Time:  Saturday January 15 2022 17:27:16 EDT ?Ventricular Rate:  92 ?PR Interval:  159 ?QRS Duration: 78 ?QT Interval:  345 ?QTC Calculation: 427 ?R Axis:   94 ?Text Interpretation: Sinus rhythm Right axis deviation Abnormal T, consider ischemia, diffuse leads No significant change since last tracing Confirmed by Isla Pence [phone]) on 01/15/2022 5:52:17 PM ? ?Radiology ?DG Chest Port 1 View ? ?Result Date: 01/15/2022 ?CLINICAL DATA:  Shortness of breath. Complains of COPD exacerbation. EXAM: PORTABLE CHEST 1 VIEW COMPARISON:   09/18/2021 FINDINGS: Stable cardiomediastinal contours. Asymmetric increased reticular interstitial markings within the right lung is identified. No pleural effusion or airspace consolidation. Visualized osseous structures are unremarkable. IMPRESSION: New asymmetric increased reticular interstitial markings within the right lung which may reflect interstitial edema or atypical infection. Electronically Signed  By: Kerby Moors M.D.   On: 01/15/2022 18:03   ? ?Procedures ?Procedures  ? ? ?Medications Ordered in ED ?Medications  ?cefTRIAXone (ROCEPHIN) 1 g in sodium chloride 0.9 % 100 mL IVPB (1 g Intravenous New Bag/Given 01/15/22 1941)  ?azithromycin (ZITHROMAX) 500 mg in sodium chloride 0.9 % 250 mL IVPB (has no administration in time range)  ?insulin aspart (novoLOG) injection 0-15 Units (has no administration in time range)  ?albuterol (PROVENTIL) (2.5 MG/3ML) 0.083% nebulizer solution 5 mg (5 mg Nebulization Given 01/15/22 1732)  ? ? ?ED Course/ Medical Decision Making/ A&P ?  ?                        ?Medical Decision Making ?Amount and/or Complexity of Data Reviewed ?Labs: ordered. ?Radiology: ordered. ? ?Risk ?Prescription drug management. ?Decision regarding hospitalization. ? ? ?This patient presents to the ED for concern of sob, this involves an extensive number of treatment options, and is a complaint that carries with it a high risk of complications and morbidity.  The differential diagnosis includes COPD exac, pna, bronchitis, uri, covid/flu ? ? ?Co morbidities that complicate the patient evaluation ? ?COPD (not normally on oxygen), htn, and dm ? ? ?Additional history obtained: ? ?Additional history obtained from epic chart review ?External records from outside source obtained and reviewed including EMS report ? ? ?Lab Tests: ? ?I Ordered, and personally interpreted labs.  The pertinent results include:  cbc with wbc 11.9, bmp with glucose 149; bnp 380; abg with ph 7.2, pCO2 77.8 and pO2  59. ? ? ?Imaging Studies ordered: ? ?I ordered imaging studies including cxr  ?I independently visualized and interpreted imaging which showed  ?  ?IMPRESSION:  ?New asymmetric increased reticular interstitial markings

## 2022-01-15 NOTE — Assessment & Plan Note (Signed)
this patient has acute respiratory failure with Hypoxia and   Hypercarbia as documented by the presence of following: ?O2 saturatio< 90% on RA pH <7.35 with pCO2 >50  ?Likely due to: COPD exacerbation, COVID pneumonia ?Provide O2 therapy and titrate as needed ? Continuous pulse ox ?  check Pulse ox with ambulation prior to discharge ? may need  TC consult for home O2 set up ? flutter valve ordered ?Will need BiPAP ?

## 2022-01-15 NOTE — ED Notes (Signed)
Pt arrived on non-rebreather at 94%. EDP placed pt on 4L nasal canula O2 91%. Pt slowly decreased to 85%. RN increased O2 nasal canula to 6L and NT changed pulse ox cord. Pt O2 at 86%. Placed pt back on non-rebreather and notified RT. ?

## 2022-01-15 NOTE — Assessment & Plan Note (Signed)
-   Spoke about importance of quitting spent 5 minutes discussing options for treatment, prior attempts at quitting, and dangers of smoking ? -At this point patient is    interested in quitting ? - order nicotine patch  ? - nursing tobacco cessation protocol ? ?

## 2022-01-15 NOTE — ED Triage Notes (Signed)
Pt bib ems from home c/o COPD exacerbation since last night. EMS noted pt room air 63%. Pt was placed on non-rebreather 94%. EMS removed non-rebreather to 4L pt dropped to 80%. ? ?Pt received breathing tx 5 mg Albuterol, 0.5 Atrovent, and 125 mg Solumedrol with some relief.  ? ?Pt is not on O2 at home. Pt states she is still smoking. ? ?HR 92 ?O2 Non-Rebreather 92% ?CBG 115 ?Capnography 50 ?

## 2022-01-15 NOTE — Assessment & Plan Note (Signed)
-   currently appears to be slightly on the dry side, hold home diuretics for tonight and restart when appears euvolemic, carefuly follow fluid status and Cr  

## 2022-01-15 NOTE — Assessment & Plan Note (Signed)
-   Order Sensitive  SSI     -  check TSH and HgA1C  - Hold by mouth medications*  

## 2022-01-15 NOTE — Subjective & Objective (Signed)
Came from home complaining of cough shortness of breath thought to have COPD exacerbation worse since last night.  On EMS arrival to home O2 sat 63% on room air.  Patient started nonrebreather when was decreased down to 4 L he dropped to 80%. ?Per EMS received albuterol Atrovent and 125 mg of Solu-Medrol and some relief.  At baseline not on oxygen. ?Patient continues to smoke. ?Last admission for the same in December 2022 ?

## 2022-01-15 NOTE — Assessment & Plan Note (Signed)
-    Patient presenting with    Hypoxia , and infiltrate  ?-Infiltrate on CXR and 2-3 characteristics (fever, leukocytosis, purulent sputum) are consistent with pneumonia. ?-This appears to be most likely community-acquired pneumonia.  ?  ? will admit for treatment of CAP will start on appropriate antibiotic coverage. - Rocephin/azithromycin ?  Obtain:  sputum cultures, ?                 respiratory panel  ?                  Influenza  negative ?                 COVID PCR negative  ?                 blood cultures and sputum cultures ordered  ?                 strep pneumo UA antigen,  ?                  check for Legionella antigen. ?               Provide oxygen as needed.  ? ? ?

## 2022-01-15 NOTE — Assessment & Plan Note (Signed)
-  -   Will initiate: Steroid taper ? -  Antibiotics rocephin/azithro ?- Albuterol  PRN, ?- scheduled duoneb, ? -  Breo or Dulera at discharge ?  -  Mucinex.  ?Titrate O2 to saturation >90%. Follow patients respiratory status. ?  nfluenza PCR neg ?  VBG showed CO2 retention clinically improved on BiPAP ?  ? -  BiPAP ordered PRN for increased work of breathing. ? Currently mentating well no evidence of symptomatic hypercarbia ? ?

## 2022-01-15 NOTE — Assessment & Plan Note (Signed)
Stable continue Lipitor ?

## 2022-01-16 ENCOUNTER — Inpatient Hospital Stay (HOSPITAL_COMMUNITY): Payer: Medicare (Managed Care)

## 2022-01-16 DIAGNOSIS — J9602 Acute respiratory failure with hypercapnia: Secondary | ICD-10-CM

## 2022-01-16 DIAGNOSIS — E785 Hyperlipidemia, unspecified: Secondary | ICD-10-CM

## 2022-01-16 DIAGNOSIS — J441 Chronic obstructive pulmonary disease with (acute) exacerbation: Principal | ICD-10-CM

## 2022-01-16 DIAGNOSIS — J189 Pneumonia, unspecified organism: Secondary | ICD-10-CM

## 2022-01-16 DIAGNOSIS — Z720 Tobacco use: Secondary | ICD-10-CM

## 2022-01-16 DIAGNOSIS — I4891 Unspecified atrial fibrillation: Secondary | ICD-10-CM | POA: Diagnosis not present

## 2022-01-16 DIAGNOSIS — J9601 Acute respiratory failure with hypoxia: Secondary | ICD-10-CM | POA: Diagnosis not present

## 2022-01-16 DIAGNOSIS — I5032 Chronic diastolic (congestive) heart failure: Secondary | ICD-10-CM | POA: Diagnosis not present

## 2022-01-16 DIAGNOSIS — E119 Type 2 diabetes mellitus without complications: Secondary | ICD-10-CM

## 2022-01-16 DIAGNOSIS — I10 Essential (primary) hypertension: Secondary | ICD-10-CM

## 2022-01-16 HISTORY — DX: Hypomagnesemia: E83.42

## 2022-01-16 LAB — BLOOD GAS, ARTERIAL
Acid-Base Excess: 4.6 mmol/L — ABNORMAL HIGH (ref 0.0–2.0)
Bicarbonate: 34 mmol/L — ABNORMAL HIGH (ref 20.0–28.0)
Drawn by: 28338
O2 Saturation: 97.2 %
Patient temperature: 36.9
pCO2 arterial: 74 mmHg (ref 32–48)
pH, Arterial: 7.27 — ABNORMAL LOW (ref 7.35–7.45)
pO2, Arterial: 85 mmHg (ref 83–108)

## 2022-01-16 LAB — CBC WITH DIFFERENTIAL/PLATELET
Abs Immature Granulocytes: 0.08 10*3/uL — ABNORMAL HIGH (ref 0.00–0.07)
Basophils Absolute: 0 10*3/uL (ref 0.0–0.1)
Basophils Relative: 0 %
Eosinophils Absolute: 0 10*3/uL (ref 0.0–0.5)
Eosinophils Relative: 0 %
HCT: 43.7 % (ref 36.0–46.0)
Hemoglobin: 13.7 g/dL (ref 12.0–15.0)
Immature Granulocytes: 1 %
Lymphocytes Relative: 8 %
Lymphs Abs: 0.7 10*3/uL (ref 0.7–4.0)
MCH: 30.9 pg (ref 26.0–34.0)
MCHC: 31.4 g/dL (ref 30.0–36.0)
MCV: 98.4 fL (ref 80.0–100.0)
Monocytes Absolute: 0.2 10*3/uL (ref 0.1–1.0)
Monocytes Relative: 2 %
Neutro Abs: 8 10*3/uL — ABNORMAL HIGH (ref 1.7–7.7)
Neutrophils Relative %: 89 %
Platelets: 172 10*3/uL (ref 150–400)
RBC: 4.44 MIL/uL (ref 3.87–5.11)
RDW: 15.8 % — ABNORMAL HIGH (ref 11.5–15.5)
WBC: 8.9 10*3/uL (ref 4.0–10.5)
nRBC: 0.2 % (ref 0.0–0.2)

## 2022-01-16 LAB — GLUCOSE, CAPILLARY
Glucose-Capillary: 122 mg/dL — ABNORMAL HIGH (ref 70–99)
Glucose-Capillary: 154 mg/dL — ABNORMAL HIGH (ref 70–99)
Glucose-Capillary: 162 mg/dL — ABNORMAL HIGH (ref 70–99)
Glucose-Capillary: 175 mg/dL — ABNORMAL HIGH (ref 70–99)
Glucose-Capillary: 214 mg/dL — ABNORMAL HIGH (ref 70–99)
Glucose-Capillary: 220 mg/dL — ABNORMAL HIGH (ref 70–99)

## 2022-01-16 LAB — ECHOCARDIOGRAM COMPLETE
AR max vel: 1.61 cm2
AV Area VTI: 1.21 cm2
AV Area mean vel: 1.33 cm2
AV Mean grad: 17 mmHg
AV Peak grad: 28.5 mmHg
Ao pk vel: 2.67 m/s
Area-P 1/2: 3.53 cm2
Height: 62 in
S' Lateral: 2.5 cm
Weight: 3118.19 oz

## 2022-01-16 LAB — COMPREHENSIVE METABOLIC PANEL
ALT: 37 U/L (ref 0–44)
AST: 29 U/L (ref 15–41)
Albumin: 3.2 g/dL — ABNORMAL LOW (ref 3.5–5.0)
Alkaline Phosphatase: 68 U/L (ref 38–126)
Anion gap: 6 (ref 5–15)
BUN: 10 mg/dL (ref 8–23)
CO2: 30 mmol/L (ref 22–32)
Calcium: 9.3 mg/dL (ref 8.9–10.3)
Chloride: 101 mmol/L (ref 98–111)
Creatinine, Ser: 0.67 mg/dL (ref 0.44–1.00)
GFR, Estimated: >60 mL/min (ref >60)
Glucose, Bld: 158 mg/dL — ABNORMAL HIGH (ref 70–99)
Potassium: 5.2 mmol/L — ABNORMAL HIGH (ref 3.5–5.1)
Sodium: 137 mmol/L (ref 135–145)
Total Bilirubin: 0.7 mg/dL (ref 0.3–1.2)
Total Protein: 6.8 g/dL (ref 6.5–8.1)

## 2022-01-16 LAB — URINALYSIS, ROUTINE W REFLEX MICROSCOPIC
Bilirubin Urine: NEGATIVE
Glucose, UA: NEGATIVE mg/dL
Hgb urine dipstick: NEGATIVE
Ketones, ur: NEGATIVE mg/dL
Leukocytes,Ua: NEGATIVE
Nitrite: NEGATIVE
Protein, ur: 100 mg/dL — AB
Specific Gravity, Urine: 1.019 (ref 1.005–1.030)
pH: 5 (ref 5.0–8.0)

## 2022-01-16 LAB — PROCALCITONIN: Procalcitonin: <0.1 ng/mL

## 2022-01-16 LAB — RESPIRATORY PANEL BY PCR

## 2022-01-16 LAB — PHOSPHORUS: Phosphorus: 4.3 mg/dL (ref 2.5–4.6)

## 2022-01-16 LAB — MAGNESIUM: Magnesium: 1.6 mg/dL — ABNORMAL LOW (ref 1.7–2.4)

## 2022-01-16 LAB — HIV ANTIBODY (ROUTINE TESTING W REFLEX): HIV Screen 4th Generation wRfx: NONREACTIVE

## 2022-01-16 LAB — HEMOGLOBIN A1C
Hgb A1c MFr Bld: 7.1 % — ABNORMAL HIGH (ref 4.8–5.6)
Mean Plasma Glucose: 157.07 mg/dL

## 2022-01-16 LAB — STREP PNEUMONIAE URINARY ANTIGEN: Strep Pneumo Urinary Antigen: NEGATIVE

## 2022-01-16 LAB — MRSA NEXT GEN BY PCR, NASAL: MRSA by PCR Next Gen: NOT DETECTED

## 2022-01-16 MED ORDER — LORATADINE 10 MG PO TABS
10.0000 mg | ORAL_TABLET | Freq: Every day | ORAL | Status: DC
  Administered 2022-01-16 – 2022-01-20 (×5): 10 mg via ORAL
  Filled 2022-01-16 (×5): qty 1

## 2022-01-16 MED ORDER — INSULIN ASPART 100 UNIT/ML IJ SOLN
0.0000 [IU] | Freq: Three times a day (TID) | INTRAMUSCULAR | Status: DC
  Administered 2022-01-16: 5 [IU] via SUBCUTANEOUS
  Administered 2022-01-16 – 2022-01-17 (×3): 3 [IU] via SUBCUTANEOUS
  Administered 2022-01-17: 11 [IU] via SUBCUTANEOUS
  Administered 2022-01-18: 3 [IU] via SUBCUTANEOUS
  Administered 2022-01-18 (×2): 5 [IU] via SUBCUTANEOUS
  Administered 2022-01-19: 3 [IU] via SUBCUTANEOUS
  Administered 2022-01-19: 5 [IU] via SUBCUTANEOUS
  Administered 2022-01-19: 8 [IU] via SUBCUTANEOUS
  Administered 2022-01-20: 2 [IU] via SUBCUTANEOUS

## 2022-01-16 MED ORDER — METHYLPREDNISOLONE SODIUM SUCC 125 MG IJ SOLR
120.0000 mg | Freq: Two times a day (BID) | INTRAMUSCULAR | Status: DC
  Administered 2022-01-16 – 2022-01-19 (×6): 120 mg via INTRAVENOUS
  Filled 2022-01-16 (×7): qty 2

## 2022-01-16 MED ORDER — MAGNESIUM SULFATE 4 GM/100ML IV SOLN
4.0000 g | Freq: Once | INTRAVENOUS | Status: AC
  Administered 2022-01-16: 4 g via INTRAVENOUS
  Filled 2022-01-16: qty 100

## 2022-01-16 MED ORDER — PERFLUTREN LIPID MICROSPHERE
1.0000 mL | INTRAVENOUS | Status: AC | PRN
  Administered 2022-01-16: 2 mL via INTRAVENOUS
  Filled 2022-01-16: qty 10

## 2022-01-16 MED ORDER — FLUTICASONE PROPIONATE 50 MCG/ACT NA SUSP
2.0000 | Freq: Every day | NASAL | Status: DC
  Administered 2022-01-18: 2 via NASAL
  Filled 2022-01-16: qty 16

## 2022-01-16 MED ORDER — HYDROCODONE BIT-HOMATROP MBR 5-1.5 MG/5ML PO SOLN: 5.0000 mL | Freq: Four times a day (QID) | ORAL | Status: DC | PRN

## 2022-01-16 MED ORDER — CHLORHEXIDINE GLUCONATE 0.12 % MT SOLN
15.0000 mL | Freq: Two times a day (BID) | OROMUCOSAL | Status: DC
  Administered 2022-01-17 – 2022-01-19 (×6): 15 mL via OROMUCOSAL
  Filled 2022-01-16 (×7): qty 15

## 2022-01-16 MED ORDER — GUAIFENESIN ER 600 MG PO TB12
1200.0000 mg | ORAL_TABLET | Freq: Two times a day (BID) | ORAL | Status: DC
  Administered 2022-01-16 – 2022-01-20 (×9): 1200 mg via ORAL
  Filled 2022-01-16 (×10): qty 2

## 2022-01-16 MED ORDER — BUDESONIDE 0.5 MG/2ML IN SUSP
0.5000 mg | Freq: Two times a day (BID) | RESPIRATORY_TRACT | Status: DC
  Administered 2022-01-16 – 2022-01-20 (×9): 0.5 mg via RESPIRATORY_TRACT
  Filled 2022-01-16 (×9): qty 2

## 2022-01-16 MED ORDER — ARFORMOTEROL TARTRATE 15 MCG/2ML IN NEBU
15.0000 ug | INHALATION_SOLUTION | Freq: Two times a day (BID) | RESPIRATORY_TRACT | Status: DC
  Administered 2022-01-16 – 2022-01-20 (×8): 15 ug via RESPIRATORY_TRACT
  Filled 2022-01-16 (×10): qty 2

## 2022-01-16 NOTE — Assessment & Plan Note (Signed)
-   Continue home regimen statin. ?

## 2022-01-16 NOTE — Progress Notes (Signed)
Patient came off of bipap to take her meds, sats 92-94% on 15L nasal cannula. She prefers to keep it off for a little while. Will monitor sats and replace bipap shortly since CO2 is elevated. ?

## 2022-01-16 NOTE — Progress Notes (Signed)
Deanna Petty Cancellation Note ? ?Patient Details ?Name: Deanna Petty ?MRN: [id number] ?DOB: 29-Aug-1946 ? ? ?Cancelled Treatment:    Reason Eval/Treat Not Completed: Patient at procedure or test/unavailable. Will plan to follow-up later as time permits. ? ? ?Deanna Petty, Deanna Petty, Deanna Petty ?Acute Rehabilitation Services  ?Pager: [phone] ?Office: [phone] ? ? ? ?Deanna Petty ?01/16/2022, 1:57 PM ? ? ?

## 2022-01-16 NOTE — Assessment & Plan Note (Addendum)
Acute diastolic heart failure.  ?Echocardiogram with preserved LV systolic function with EF >75%, mild LVH, preserved RV systolic function. Mild to moderate aortic stenosis.  ? ?Patient was diuresed with furosemide with good toleration.  ?Acute on chronic core pulmonale. ?At discharge will continue blood pressure monitoring.  ?Hold on further diuresis.  ?

## 2022-01-16 NOTE — Progress Notes (Signed)
?PROGRESS NOTE ? ? ? ?TIA HIERONYMUS  TIR:[id number] DOB: 25-Dec-1945 DOA: 01/15/2022 ?PCP: Arthur Holms, NP  ? ? ?Chief Complaint  ?Patient presents with  ? Shortness of Breath  ? ? ?Brief Narrative:  ?Patient 76 year old female history of COPD with ongoing tobacco use, chronic diastolic CHF, DM 2 presented to the ED with severe shortness of breath, cough, wheezing.  Patient noted to have sats of 63% on room air placed on a nonrebreather.  Patient received albuterol and Solu-Medrol by EMS and brought to the ED.  Chest x-ray done concerning for right lower lobe pneumonia.  ABG done with a respiratory acidosis with hypercarbia.  Patient admitted placed on the BiPAP and being treated for acute COPD exacerbation and CAP.  COVID-19 PCR, influenza PCR negative.  ? ? ?Assessment & Plan: ? Principal Problem: ?  Acute hypoxemic respiratory failure (HCC) ?Active Problems: ?  COPD exacerbation ([place]) ?  Tobacco use ?  CAP (community acquired pneumonia) ?  Type 2 diabetes mellitus without complication (HCC) ?  Chronic diastolic CHF (congestive heart failure) ([place]) ?  Hyperlipidemia ?  Abnormal EKG ?  Essential hypertension ?  Hypomagnesemia ?  Acute respiratory failure with hypoxia and hypercapnia (HCC) ? ? ? ?Assessment and Plan: ?* Acute hypoxemic respiratory failure (HCC) ?- Patient presented with acute hypoxemic and hypercarbic respiratory failure felt likely multifactorial secondary to acute COPD exacerbation and community-acquired pneumonia. ?-Patient had presented with hypoxia with sats of 63% on room air, shortness of breath, cough, diffuse wheezing. ?-ABG obtained with a pH of 7.24/PCO2 of 78/PO2 of 59/bicarb of 33. ?-Patient currently on BiPAP ?-Trial of BiPAP for 3 hours and repeat ABG. ?-Urine strep pneumococcus antigen negative.  Urine Legionella antigen pending. ?-COVID-19 PCR, influenza PCR negative. ?-Respiratory viral panel negative. ?-Continue empiric IV Rocephin, IV azithromycin, scheduled DuoNebs. ?-Placed on  Pulmicort, Flonase, Claritin. ?-Change IV Solu-Medrol 220 mg every 12 hours. ?-Continue scheduled DuoNebs. ?-Increase Mucinex to 1200 mg twice daily. ?-Trial of BiPAP for 2 to 3 hours and repeat ABG. ?-BiPAP nightly if tolerates trial of BiPAP. ?-Continue empiric IV Rocephin, IV azithromycin. ? ?COPD exacerbation ([place]) ?- Patient presenting with cough, wheezing, shortness of breath, hypoxia. ?-ABG with a respiratory acidosis with hypoxia and hypercarbia. ?-Patient with ongoing tobacco use. ?-Continue scheduled DuoNebs. ?-Change IV Solu-Medrol to 120 mg every 12 hours. ?-Change Mucinex to 1200 mg twice daily. ?-Place on Pulmicort, Flonase, Claritin, PPI. ?-Tobacco cessation stressed to patient. ?-Currently on BiPAP, trial of BiPAP for 2 to 3 hours. ?-BiPAP nightly. ?-Will need outpatient follow-up with pulmonary postdischarge. ? ? ?CAP (community acquired pneumonia) ?- Noted on chest x-ray. ?-Urine strep pneumococcus antigen negative. ?-Urine Legionella antigen pending. ?-SLP evaluation. ?-Continue empiric IV Rocephin, IV azithromycin, Mucinex, scheduled nebs. ? ?Tobacco use ?- Tobacco cessation stressed to patient ?-Continue nicotine patch. ? ?Type 2 diabetes mellitus without complication ([place]) ?- Hemoglobin A1c 7.1 (01/15/2022). ?-CBG 154 this morning. ?-Hold home regimen oral hypoglycemic agents. ?-Change sliding scale to 3 times daily with meals. ?-Check CBG to ACHS. ?-May need long-acting insulin while on IV steroids, will monitor for now. ? ?Hyperlipidemia ?- Continue home regimen statin. ? ?Chronic diastolic CHF (congestive heart failure) ([place]) ?- Currently stable, slightly on the dry side. ?-Continue Toprol-XL. ?-Hold Cozaar. ? ?Hypomagnesemia ?- Magnesium at 1.6. ?-Magnesium sulfate 4 g IV x1. ?-Repeat labs in AM. ? ?Essential hypertension ?- Continue Norvasc, Toprol-XL. ? ?Abnormal EKG ?- Patient with abnormal EKG however similar to prior EKG. ?-Patient with no overt chest pain ?-Check a  2D echo. ? ? ? ? ?   ? ? ?DVT prophylaxis: Lovenox ?Code Status: Full ?Family Communication: Updated patient.  No family at bedside. ?Disposition: TBD ? ?Status is: Inpatient ?Remains inpatient appropriate because: Severity of illness ?  ?Consultants:  ?None ? ?Procedures:  ?Chest x-ray 01/15/2022 ? ?Antimicrobials:  ?IV azithromycin 01/15/2022 >>>>> ?IV Rocephin 01/15/2022 >>>>> ? ? ?Subjective: ?Patient laying in bed CPAP machine on, trying to drink with a straw.  Still with shortness of breath however feels improving since admission.  No chest pain. ? ?Objective: ?Vitals:  ? 01/16/22 1002 01/16/22 1114 01/16/22 1118 01/16/22 1432  ?BP:   (!) 108/51   ?Pulse: 93 78 75   ?Resp: 20 (!) 27 20   ?Temp:   98.4 ?F (36.9 ?C)   ?TempSrc:   Axillary   ?SpO2: 91% 94% 94% 94%  ?Weight:      ?Height:      ? ? ?Intake/Output Summary (Last 24 hours) at 01/16/2022 1544 ?Last data filed at 01/16/2022 1100 ?Gross per 24 hour  ?Intake 831.79 ml  ?Output --  ?Net 831.79 ml  ? ?Filed Weights  ? 01/15/22 2124 01/16/22 0402  ?Weight: 88.1 kg 88.4 kg  ? ? ?Examination: ? ?General exam: NAD. ?Respiratory system: Fair air movement.  Some diffuse wheezing.  No crackles noted.  On BiPAP.  ?Cardiovascular system: S1 & S2 heard, RRR. No JVD, murmurs, rubs, gallops or clicks. No pedal edema. ?Gastrointestinal system: Abdomen is nondistended, soft and nontender. No organomegaly or masses felt. Normal bowel sounds heard. ?Central nervous system: Alert and oriented. No focal neurological deficits. ?Extremities: Symmetric 5 x 5 power. ?Skin: No rashes, lesions or ulcers ?Psychiatry: Judgement and insight appear normal. Mood & affect appropriate.  ? ? ? ?Data Reviewed:  ? ?CBC: ?Recent Labs  ?Lab 01/15/22 ?1721 01/15/22 ?1827 01/15/22 ?1959 01/16/22 ?0426  ?WBC 11.9*  --  10.3 8.9  ?NEUTROABS 7.7  --  9.4* 8.0*  ?HGB 15.3* 16.3* 13.6 13.7  ?HCT 50.0* 48.0* 43.6 43.7  ?MCV 99.6  --  100.2* 98.4  ?PLT 171  --  133* 172  ? ? ?Basic Metabolic Panel: ?Recent Labs  ?Lab  01/15/22 ?1721 01/15/22 ?1827 01/16/22 ?0426  ?NA 137 136 137  ?K 4.4 4.6 5.2*  ?CL 98  --  101  ?CO2 33*  --  30  ?GLUCOSE 149*  --  158*  ?BUN 8  --  10  ?CREATININE 0.70  --  0.67  ?CALCIUM 9.7  --  9.3  ?MG 1.7  --  1.6*  ?PHOS 4.3  --  4.3  ? ? ?GFR: ?Estimated Creatinine Clearance: 63.7 mL/min (by C-G formula based on SCr of 0.67 mg/dL). ? ?Liver Function Tests: ?Recent Labs  ?Lab 01/15/22 ?1721 01/16/22 ?0426  ?AST 54* 29  ?ALT 46* 37  ?ALKPHOS 86 68  ?BILITOT 0.9 0.7  ?PROT 7.8 6.8  ?ALBUMIN 3.7 3.2*  ? ? ?CBG: ?Recent Labs  ?Lab 01/15/22 ?2000 01/16/22 ?0013 01/16/22 ?0347 01/16/22 ?4259 01/16/22 ?1120  ?GLUCAP 150* 214* 162* 154* 220*  ? ? ? ?Recent Results (from the past 240 hour(s))  ?Culture, blood (routine x 2)     Status: None (Preliminary result)  ? Collection Time: 01/15/22  6:50 PM  ? Specimen: BLOOD  ?Result Value Ref Range Status  ? Specimen Description BLOOD RIGHT ANTECUBITAL  Final  ? Special Requests   Final  ?  BOTTLES DRAWN AEROBIC AND ANAEROBIC Blood Culture adequate volume  ? Culture  Final  ?  NO GROWTH < 24 HOURS ?Performed at [facility], [place] [address], [place], [place] [address] ?  ? Report Status PENDING  Incomplete  ?Resp Panel by RT-PCR (Flu A&B, Covid) Nasopharyngeal Swab     Status: None  ? Collection Time: 01/15/22  6:52 PM  ? Specimen: Nasopharyngeal Swab; Nasopharyngeal(NP) swabs in vial transport medium  ?Result Value Ref Range Status  ? SARS Coronavirus 2 by RT PCR NEGATIVE NEGATIVE Final  ?  Comment: (NOTE) ?SARS-CoV-2 target nucleic acids are NOT DETECTED. ? ?The SARS-CoV-2 RNA is generally detectable in upper respiratory ?specimens during the acute phase of infection. The lowest ?concentration of SARS-CoV-2 viral copies this assay can detect is ?138 copies/mL. A negative result does not preclude SARS-Cov-2 ?infection and should not be used as the sole basis for treatment or ?other patient management decisions. A negative result may occur with  ?improper specimen  collection/handling, submission of specimen other ?than nasopharyngeal swab, presence of viral mutation(s) within the ?areas targeted by this assay, and inadequate number of viral ?copies(<138 copies/mL). A negative res

## 2022-01-16 NOTE — Assessment & Plan Note (Addendum)
-   Patient with abnormal EKG however similar to prior EKG. ?-Patient with no overt chest pain ?-2D echo with EF > 75%, NWMA, mild LVH, indeterminate diastolic left ventricular parameters.  Mild to moderate AS. ?-Continue IV diuretics. ?-Will need outpatient follow-up with cardiology. ?

## 2022-01-16 NOTE — Hospital Course (Addendum)
[name] was admitted to the hospital with the working diagnosis of decompensated COPD exacerbation complicated with heart failure exacerbation.  ? ?Patient 76 year old female history of COPD with ongoing tobacco use, chronic diastolic CHF, DM 2 presented to the ED with severe shortness of breath, cough, wheezing.  Patient noted to have sats of 63% on room air placed on a nonrebreather.  Patient received albuterol and Solu-Medrol by EMS and brought to the ED.  On her initial physical examination her blood pressure was 1125/104, HR 97, RR 23, 02 saturation 92% on supplemental 02, heart with S1 and S2 present and rhythmic, lungs with wheezing and rales, abdomen not distended and no lower extremity edema.  ? ?ABG 7.23/ 77.8/ 59/ 36/ 84%  ?Na 137, K 4,4 CL 98, bicarbonate 33, glucose 149, bun 8 cr 0,70 ?BNP 380 ?Wbc 11.9, hgb 15,3 hct 50, plt 171 ?Sars covid 19 negative  ? ?Chest radiograph with bilateral interstitial infiltrates, predominantly at the right lower lobe (more dense).  ? ?EKG 92 bpm, right axis, normal qtx, sinus rhythm with poor R wave progression, no ST segment changes, negative T wave lead II, III, Avf, V4 to V6.  ? ?Patient was placed on non invasive mechanical ventilation, received systemic steroid and antibiotic therapy.  ?Added diuresis for volume overload with good response. ? ?Plan to continue bronchodilator therapy at home, smoking cessation and will have ambulatory oxymetry on room air before her discharge.  ?

## 2022-01-16 NOTE — Assessment & Plan Note (Signed)
-   Tobacco cessation stressed to patient ?-Continue nicotine patch. ?

## 2022-01-16 NOTE — Assessment & Plan Note (Addendum)
Continue antibiotic therapy with Augmentin Continue oxymetry monitoring ?No sepsis (on admission)  ?

## 2022-01-16 NOTE — Assessment & Plan Note (Addendum)
Hyperkalemia. ? ?Her electrolytes were corrected and her renal function remained stable. ?A the time of her discharge her renal function has a serum cr of 0,93, K is 4,7 and serum bicarbonate at 39.  ?

## 2022-01-16 NOTE — Assessment & Plan Note (Addendum)
Hyper and hypoglycemia,  ?Steroid induced hyperglycemia.  ? ?Patient was placed on insulin therapy with good toleration. ?At the time of her discharge her fasting glucose is 120 mg/dl.  ?Will continue 2 more days with oral prednisone.  ?Resume metformin at discharge. ? ?Continue with atorvastatin for dyslipidemia.  ?

## 2022-01-16 NOTE — Assessment & Plan Note (Addendum)
Blood pressure control with metoprolol and amlodipine  ?

## 2022-01-16 NOTE — Assessment & Plan Note (Deleted)
-   Patient presented with cough, wheezing, shortness of breath, hypoxia. ?-ABG with a respiratory acidosis with hypoxia and hypercarbia. ?-Patient with ongoing tobacco use. ?-Continue scheduled DuoNebs, Mucinex, Pulmicort, Flonase, Claritin, PPI, Brovana. ?-Decrease IV Solu-Medrol to '120mg'$  daily. ?-Tobacco cessation stressed to patient. ?-Patient tolerated BiPAP overnight and currently off BiPAP. ?-Keep off BiPAP today and placed on BiPAP nightly for now. ?-Will need outpatient follow-up with pulmonary postdischarge. ? ?

## 2022-01-16 NOTE — Plan of Care (Signed)
?  Problem: Respiratory: ?Goal: Levels of oxygenation will improve ?Outcome: Progressing ?  ?Problem: Respiratory: ?Goal: Ability to maintain a clear airway will improve ?Outcome: Progressing ?  ?Problem: Respiratory: ?Goal: Ability to maintain adequate ventilation will improve ?Outcome: Progressing ?  ?

## 2022-01-17 ENCOUNTER — Inpatient Hospital Stay (HOSPITAL_COMMUNITY): Payer: Medicare (Managed Care)

## 2022-01-17 DIAGNOSIS — J441 Chronic obstructive pulmonary disease with (acute) exacerbation: Secondary | ICD-10-CM | POA: Diagnosis not present

## 2022-01-17 DIAGNOSIS — J189 Pneumonia, unspecified organism: Secondary | ICD-10-CM | POA: Diagnosis not present

## 2022-01-17 DIAGNOSIS — E875 Hyperkalemia: Secondary | ICD-10-CM | POA: Diagnosis not present

## 2022-01-17 DIAGNOSIS — I5032 Chronic diastolic (congestive) heart failure: Secondary | ICD-10-CM | POA: Diagnosis not present

## 2022-01-17 DIAGNOSIS — J9601 Acute respiratory failure with hypoxia: Secondary | ICD-10-CM | POA: Diagnosis not present

## 2022-01-17 LAB — BLOOD GAS, ARTERIAL
Acid-Base Excess: 4.5 mmol/L — ABNORMAL HIGH (ref 0.0–2.0)
Bicarbonate: 32.7 mmol/L — ABNORMAL HIGH (ref 20.0–28.0)
Drawn by: 362771
O2 Saturation: 97.7 %
Patient temperature: 36.4
pCO2 arterial: 63 mmHg — ABNORMAL HIGH (ref 32–48)
pH, Arterial: 7.32 — ABNORMAL LOW (ref 7.35–7.45)
pO2, Arterial: 96 mmHg (ref 83–108)

## 2022-01-17 LAB — CBC WITH DIFFERENTIAL/PLATELET
Abs Immature Granulocytes: 0.08 10*3/uL — ABNORMAL HIGH (ref 0.00–0.07)
Basophils Absolute: 0 10*3/uL (ref 0.0–0.1)
Basophils Relative: 0 %
Eosinophils Absolute: 0 10*3/uL (ref 0.0–0.5)
Eosinophils Relative: 0 %
HCT: 42.5 % (ref 36.0–46.0)
Hemoglobin: 13.2 g/dL (ref 12.0–15.0)
Immature Granulocytes: 1 %
Lymphocytes Relative: 5 %
Lymphs Abs: 0.6 10*3/uL — ABNORMAL LOW (ref 0.7–4.0)
MCH: 30.8 pg (ref 26.0–34.0)
MCHC: 31.1 g/dL (ref 30.0–36.0)
MCV: 99.3 fL (ref 80.0–100.0)
Monocytes Absolute: 0.3 10*3/uL (ref 0.1–1.0)
Monocytes Relative: 2 %
Neutro Abs: 12.3 10*3/uL — ABNORMAL HIGH (ref 1.7–7.7)
Neutrophils Relative %: 92 %
Platelets: 187 10*3/uL (ref 150–400)
RBC: 4.28 MIL/uL (ref 3.87–5.11)
RDW: 15.6 % — ABNORMAL HIGH (ref 11.5–15.5)
WBC: 13.3 10*3/uL — ABNORMAL HIGH (ref 4.0–10.5)
nRBC: 0 % (ref 0.0–0.2)

## 2022-01-17 LAB — URINE CULTURE

## 2022-01-17 LAB — BASIC METABOLIC PANEL
Anion gap: 5 (ref 5–15)
BUN: 23 mg/dL (ref 8–23)
CO2: 31 mmol/L (ref 22–32)
Calcium: 8.9 mg/dL (ref 8.9–10.3)
Chloride: 99 mmol/L (ref 98–111)
Creatinine, Ser: 0.93 mg/dL (ref 0.44–1.00)
GFR, Estimated: >60 mL/min (ref >60)
Glucose, Bld: 184 mg/dL — ABNORMAL HIGH (ref 70–99)
Potassium: 5.6 mmol/L — ABNORMAL HIGH (ref 3.5–5.1)
Sodium: 135 mmol/L (ref 135–145)

## 2022-01-17 LAB — GLUCOSE, CAPILLARY
Glucose-Capillary: 200 mg/dL — ABNORMAL HIGH (ref 70–99)
Glucose-Capillary: 304 mg/dL — ABNORMAL HIGH (ref 70–99)
Glucose-Capillary: 151 mg/dL — ABNORMAL HIGH (ref 70–99)
Glucose-Capillary: 244 mg/dL — ABNORMAL HIGH (ref 70–99)

## 2022-01-17 LAB — BRAIN NATRIURETIC PEPTIDE: B Natriuretic Peptide: 320.9 pg/mL — ABNORMAL HIGH (ref 0.0–100.0)

## 2022-01-17 LAB — POTASSIUM: Potassium: 5.2 mmol/L — ABNORMAL HIGH (ref 3.5–5.1)

## 2022-01-17 LAB — MAGNESIUM: Magnesium: 2.3 mg/dL (ref 1.7–2.4)

## 2022-01-17 LAB — PROCALCITONIN: Procalcitonin: <0.1 ng/mL

## 2022-01-17 MED ORDER — SODIUM ZIRCONIUM CYCLOSILICATE 10 G PO PACK
10.0000 g | PACK | Freq: Once | ORAL | Status: AC
  Administered 2022-01-17: 10 g via ORAL
  Filled 2022-01-17: qty 1

## 2022-01-17 MED ORDER — CALCIUM GLUCONATE-NACL 1-0.675 GM/50ML-% IV SOLN
1.0000 g | Freq: Once | INTRAVENOUS | Status: AC
  Administered 2022-01-17: 1000 mg via INTRAVENOUS
  Filled 2022-01-17: qty 50

## 2022-01-17 MED ORDER — AZITHROMYCIN 250 MG PO TABS
500.0000 mg | ORAL_TABLET | Freq: Every day | ORAL | Status: AC
  Administered 2022-01-17 – 2022-01-19 (×3): 500 mg via ORAL
  Filled 2022-01-17 (×3): qty 2

## 2022-01-17 MED ORDER — INSULIN GLARGINE-YFGN 100 UNIT/ML ~~LOC~~ SOLN
12.0000 [IU] | Freq: Every day | SUBCUTANEOUS | Status: DC
  Administered 2022-01-17: 12 [IU] via SUBCUTANEOUS
  Filled 2022-01-17 (×2): qty 0.12

## 2022-01-17 MED ORDER — FUROSEMIDE 10 MG/ML IJ SOLN
40.0000 mg | Freq: Two times a day (BID) | INTRAMUSCULAR | Status: DC
  Administered 2022-01-17 – 2022-01-19 (×5): 40 mg via INTRAVENOUS
  Filled 2022-01-17 (×5): qty 4

## 2022-01-17 NOTE — Progress Notes (Signed)
?PROGRESS NOTE ? ? ? ?Deanna Petty  JJK:[id number] DOB: Mar 14, 1946 DOA: 01/15/2022 ?PCP: Arthur Holms, NP  ? ? ?Chief Complaint  ?Patient presents with  ? Shortness of Breath  ? ? ?Brief Narrative:  ?Patient 76 year old female history of COPD with ongoing tobacco use, chronic diastolic CHF, DM 2 presented to the ED with severe shortness of breath, cough, wheezing.  Patient noted to have sats of 63% on room air placed on a nonrebreather.  Patient received albuterol and Solu-Medrol by EMS and brought to the ED.  Chest x-ray done concerning for right lower lobe pneumonia.  ABG done with a respiratory acidosis with hypercarbia.  Patient admitted placed on the BiPAP and being treated for acute COPD exacerbation and CAP.  COVID-19 PCR, influenza PCR negative.  ? ? ?Assessment & Plan: ? Principal Problem: ?  Acute hypoxemic respiratory failure (HCC) ?Active Problems: ?  COPD exacerbation ([place]) ?  Tobacco use ?  CAP (community acquired pneumonia) ?  Type 2 diabetes mellitus without complication (HCC) ?  Chronic diastolic CHF (congestive heart failure) ([place]) ?  Hyperlipidemia ?  Abnormal EKG ?  Essential hypertension ?  Hypomagnesemia ?  Acute respiratory failure with hypoxia and hypercapnia (HCC) ?  Hyperkalemia ? ? ? ?Assessment and Plan: ?* Acute hypoxemic respiratory failure (HCC) ?- Patient presented with acute hypoxemic and hypercarbic respiratory failure felt likely multifactorial secondary to acute COPD exacerbation and community-acquired pneumonia and probable volume overload. ?-Patient had presented with hypoxia with sats of 63% on room air, shortness of breath, cough, diffuse wheezing. ?-ABG obtained with a pH of 7.24/PCO2 of 78/PO2 of 59/bicarb of 33. ?-Patient was on BiPAP overnight currently on high flow nasal cannula salter 7 L with sats of 98%. ?-Repeat ABG this morning with a pH of 7.32, PCO2 of 63, PO2 of 96, bicarb of 33. ?-Urine strep pneumococcus antigen negative.  ?-Urine Legionella antigen  pending. ?-COVID-19 PCR, influenza PCR negative. ?-Respiratory viral panel negative. ?-Continue empiric IV Rocephin, IV azithromycin, scheduled DuoNebs, Pulmicort, Flonase, Claritin, Mucinex. ?-Continue Brovana which was added last night. ?-Continue IV Solu-Medrol 120 mg every 12 hours. ?-Patient noted to have elevated BNP and as such we will place on Lasix 40 mg IV every 12 hours. ?-Monitor urine output ?-Trial off BiPAP for the rest of the day and place on BiPAP at bedtime. ? ?COPD exacerbation ([place]) ?- Patient presented with cough, wheezing, shortness of breath, hypoxia. ?-ABG with a respiratory acidosis with hypoxia and hypercarbia. ?-Patient with ongoing tobacco use. ?-Continue scheduled DuoNebs, IV Solu-Medrol 120 mg every 12 hours, Mucinex, Pulmicort, Flonase, Claritin, PPI, Brovana. ?-Tobacco cessation stressed to patient. ?-Trial off BiPAP for the rest of the day and place back on BiPAP at bedtime. ?-Will need outpatient follow-up with pulmonary postdischarge. ? ? ?CAP (community acquired pneumonia) ?- Noted on chest x-ray. ?-Urine strep pneumococcus antigen negative. ?-Urine Legionella antigen pending. ?-SLP evaluation. ?-Continue empiric IV Rocephin, IV azithromycin, Mucinex, scheduled nebs. ? ?Tobacco use ?- Tobacco cessation stressed to patient ?-Continue nicotine patch. ? ?Type 2 diabetes mellitus without complication ([place]) ?- Hemoglobin A1c 7.1 (01/15/2022). ?-CBG 200 this morning. ?-Continue to hold home regimen oral hypoglycemic agents. ?-Place on Semglee 12 units daily. ?-Continue SSI.   ?-Monitor and adjust insulin while on IV steroids.  ? ?Hyperlipidemia ?- Continue home regimen statin. ? ?Chronic diastolic CHF (congestive heart failure) ([place]) ?- Concern for possible volume overload. ?-Patient denies following up with any cardiologist and denies any history of CHF. ?-BNP noted to be elevated on admission. ?-  Continue to hold Cozaar. ?-Continue Toprol-XL. ?-Due to increased O2 requirements and  hypoxia we will place on Lasix 40 mg IV every 12 hours and monitor urine output. ?-May need to be on oral diuretics on discharge. ? ?Hyperkalemia ?- Repeat potassium of 5.2. ?-Patient to receive IV Lasix today. ?-Repeat labs in the morning. ? ?Hypomagnesemia ?- Repleted.  ?-Magnesium at 2.3.  ?-Repeat labs in AM. ? ?Essential hypertension ?- Continue Norvasc, Toprol-XL. ? ?Abnormal EKG ?- Patient with abnormal EKG however similar to prior EKG. ?-Patient with no overt chest pain ?-2D echo with EF > 75%, NWMA, mild LVH, indeterminate diastolic left ventricular parameters.  Mild to moderate AS. ?-Patient to be placed on IV Lasix. ?-Will need outpatient follow-up with cardiology. ? ? ? ? ?  ? ? ?DVT prophylaxis: Lovenox ?Code Status: Full ?Family Communication: Updated patient.  No family at bedside. ?Disposition: TBD ? ?Status is: Inpatient ?Remains inpatient appropriate because: Severity of illness ?  ?Consultants:  ?None ? ?Procedures:  ?Chest x-ray 01/15/2022 ? ?Antimicrobials:  ?IV azithromycin 01/15/2022 >>>>> ?IV Rocephin 01/15/2022 >>>>> ? ? ?Subjective: ?Sitting up in chair drinking soup.  States shortness of breath on admission is slowly improving.  Tolerated BiPAP machine overnight.  Denies any chest pain.  Denies any prior history of CHF or being followed by cardiology. ? ?Objective: ?Vitals:  ? 01/17/22 0837 01/17/22 0953 01/17/22 1100 01/17/22 1454  ?BP:  (!) 132/46 (!) 117/37   ?Pulse:  79    ?Resp:      ?Temp:   (!) 97.2 ?F (36.2 ?C)   ?TempSrc:   Axillary   ?SpO2: 98%   100%  ?Weight:      ?Height:      ? ? ?Intake/Output Summary (Last 24 hours) at 01/17/2022 1711 ?Last data filed at 01/17/2022 1654 ?Gross per 24 hour  ?Intake 885.97 ml  ?Output 1750 ml  ?Net -864.03 ml  ? ?Filed Weights  ? 01/15/22 2124 01/16/22 0402 01/17/22 0415  ?Weight: 88.1 kg 88.4 kg 89.8 kg  ? ? ?Examination: ? ?General exam: NAD. ?Respiratory system: Fair air movement. Minimal wheezing. Scattered crackles.  No significant use of  accessory muscles of respiration.  On [place] high flow nasal cannula at 7 L. ?Cardiovascular system: RRR.  Positive JVD.  No significant lower extremity edema. 2-3/6 SEM. ?Gastrointestinal system: Abdomen is soft, nontender, nondistended, positive bowel sounds.  No rebound.  No guarding.  ?Central nervous system: Alert and oriented. No focal neurological deficits. ?Extremities: Symmetric 5 x 5 power. ?Skin: No rashes, lesions or ulcers ?Psychiatry: Judgement and insight appear normal. Mood & affect appropriate.  ? ? ? ?Data Reviewed:  ? ?CBC: ?Recent Labs  ?Lab 01/15/22 ?1721 01/15/22 ?1827 01/15/22 ?1959 01/16/22 ?0426 01/17/22 ?6283  ?WBC 11.9*  --  10.3 8.9 13.3*  ?NEUTROABS 7.7  --  9.4* 8.0* 12.3*  ?HGB 15.3* 16.3* 13.6 13.7 13.2  ?HCT 50.0* 48.0* 43.6 43.7 42.5  ?MCV 99.6  --  100.2* 98.4 99.3  ?PLT 171  --  133* 172 187  ? ? ?Basic Metabolic Panel: ?Recent Labs  ?Lab 01/15/22 ?1721 01/15/22 ?1827 01/16/22 ?0426 01/17/22 ?1517 01/17/22 ?6160  ?NA 137 136 137 135  --   ?K 4.4 4.6 5.2* 5.6* 5.2*  ?CL 98  --  101 99  --   ?CO2 33*  --  30 31  --   ?GLUCOSE 149*  --  158* 184*  --   ?BUN 8  --  10 23  --   ?  CREATININE 0.70  --  0.67 0.93  --   ?CALCIUM 9.7  --  9.3 8.9  --   ?MG 1.7  --  1.6* 2.3  --   ?PHOS 4.3  --  4.3  --   --   ? ? ?GFR: ?Estimated Creatinine Clearance: 55.3 mL/min (by C-G formula based on SCr of 0.93 mg/dL). ? ?Liver Function Tests: ?Recent Labs  ?Lab 01/15/22 ?1721 01/16/22 ?0426  ?AST 54* 29  ?ALT 46* 37  ?ALKPHOS 86 68  ?BILITOT 0.9 0.7  ?PROT 7.8 6.8  ?ALBUMIN 3.7 3.2*  ? ? ?CBG: ?Recent Labs  ?Lab 01/16/22 ?1625 01/16/22 ?2001 01/17/22 ?6301 01/17/22 ?1200 01/17/22 ?1639  ?GLUCAP 175* 122* 200* 304* 151*  ? ? ? ?Recent Results (from the past 240 hour(s))  ?Culture, blood (routine x 2)     Status: None (Preliminary result)  ? Collection Time: 01/15/22  6:50 PM  ? Specimen: BLOOD  ?Result Value Ref Range Status  ? Specimen Description BLOOD RIGHT ANTECUBITAL  Final  ? Special Requests    Final  ?  BOTTLES DRAWN AEROBIC AND ANAEROBIC Blood Culture adequate volume  ? Culture   Final  ?  NO GROWTH 2 DAYS ?Performed at [facility], [place] [address] [place], [place] [address] ?  ? Report Status PENDING  Incomplet

## 2022-01-17 NOTE — Assessment & Plan Note (Addendum)
-   Repeat potassium of 4.9. ?-Repeat labs in the a.m. ?

## 2022-01-17 NOTE — TOC Progression Note (Addendum)
Transition of Care (TOC) - Progression Note  ? ? ?Patient Details  ?Name: Deanna Petty ?MRN: [id number] ?Date of Birth: 24-Mar-1946 ? ?Transition of Care (TOC) CM/SW Contact  ?Zenon Mayo, RN ?Phone Number: ?01/17/2022, 5:41 PM ? ?Clinical Narrative:    ?NCM spoke with Patient at the bedside,  she states she lives with her cousin [place]ola [place] [address].  She states she will have to ask her if it will be ok for her to get  [facility] services there at her home.  NCM spoke with Doodle at [phone], sthe states yes he can have [facility] to come to her home.  NCM offered choice to patient. And she states she does not have a preference.  She has a rolling walker and a quad cane at home.  Per Doodle her grandson , Merrily Pew will transport patient home at discharge. NCM made referral to [facility] with Alvis Lemmings ,he is able to take referral . Soc will begin 24 to 48 hrs post dc.  ? ? ?  ?  ? ?Expected Discharge Plan and Services ?  ?  ?  ?  ?  ?                ?  ?  ?  ?  ?  ?  ?  ?  ?  ?  ? ? ?Social Determinants of Health (SDOH) Interventions ?  ? ?Readmission Risk Interventions ?   ? View : No data to display.  ?  ?  ?  ? ? ?

## 2022-01-17 NOTE — Progress Notes (Signed)
Pt is resting well on BIPAP for night rest.  RT will continue to monitor. ?

## 2022-01-17 NOTE — Evaluation (Signed)
Clinical/Bedside Swallow Evaluation ?Patient Details  ?Name: CHINYERE GALIANO ?MRN: [id number] ?Date of Birth: Dec 27, 1945 ? ?Today's Date: 01/17/2022 ?Time: SLP Start Time (ACUTE ONLY): 0930 SLP Stop Time (ACUTE ONLY): 7829 ?SLP Time Calculation (min) (ACUTE ONLY): 15 min ? ?Past Medical History:  ?Past Medical History:  ?Diagnosis Date  ? Bronchitis   ? Cigarette smoker   ? COPD (chronic obstructive pulmonary disease) ([place])   ? Pneumonia   ? Shortness of breath   ? with exertion; lasted used inhaler 06/02/13  ? ?Past Surgical History:  ?Past Surgical History:  ?Procedure Laterality Date  ? ABDOMINAL HYSTERECTOMY    ? BREAST LUMPECTOMY WITH NEEDLE LOCALIZATION Left 06/12/2013  ? Procedure: LEFT BREAST LUMPECTOMY WITH NEEDLE LOCALIZATION;  Surgeon: Imogene Burn. Georgette Dover, MD;  Location: [facility];  Service: General;  Laterality: Left;  ? BREAST SURGERY    ? HIP FRACTURE SURGERY Right   ? ?HPI:  ?Patient 76 year old female history of COPD with ongoing tobacco use, chronic diastolic CHF, DM 2 presented to the ED with severe shortness of breath, cough, wheezing.   Chest x-ray done concerning for right lower lobe pneumonia.  ABG done with a respiratory acidosis with hypercarbia.  Patient admitted placed on the BiPAP and being treated for acute COPD exacerbation and CAP.  ?  ?Assessment / Plan / Recommendation  ?Clinical Impression ? Ms. Shavers was seen at bedside for clinical swallow evaluation. Pt positioned upright in chair upon SLP entry into room. Oral motor exam unremarkable, with observed missing dentition. Pt endorsed that she does not wear dentures and typically eats a regular diet (i.e., fried chicken) and thin liquids. Pt denied any history or complaints of swallowing. Pt initiated self-feeding behaviors for thin liquids, puree and regular graham texture. Pt tolerated all consistencies and textures with no overt s/s of aspriation. SLP educated pt on ordering food from menu. Pt demonstrated understanding and independence with  task. SLP recommended regular/carb modified diet at this time with thin liquids. Medications may be administered whole with thin liquids. SLP to sign off as pt is at prior level of functioning. ?SLP Visit Diagnosis: Dysphagia, unspecified (R13.10) ?   ?Aspiration Risk ? Mild aspiration risk  ?  ?Diet Recommendation Regular;Thin liquid (Carb Modified)  ? ?Liquid Administration via: Straw ?Medication Administration: Whole meds with liquid ?Supervision: Patient able to self feed ?Compensations: Small sips/bites ?Postural Changes: Seated upright at 90 degrees  ?  ?Other  Recommendations Oral Care Recommendations: Oral care BID   ? ?Recommendations for follow up therapy are one component of a multi-disciplinary discharge planning process, led by the attending physician.  Recommendations may be updated based on patient status, additional functional criteria and insurance authorization. ? ?Follow up Recommendations No SLP follow up  ? ? ?  ?Assistance Recommended at Discharge PRN  ?Functional Status Assessment Patient has had a recent decline in their functional status and demonstrates the ability to make significant improvements in function in a reasonable and predictable amount of time.  ?Frequency and Duration min 1 x/week  ?1 week ?  ?   ? ?Prognosis Prognosis for Safe Diet Advancement: Good  ? ?  ? ?Swallow Study   ?General HPI: Patient 76 year old female history of COPD with ongoing tobacco use, chronic diastolic CHF, DM 2 presented to the ED with severe shortness of breath, cough, wheezing.   Chest x-ray done concerning for right lower lobe pneumonia.  ABG done with a respiratory acidosis with hypercarbia.  Patient admitted placed on the BiPAP and being treated  for acute COPD exacerbation and CAP. ?Type of Study: Bedside Swallow Evaluation ?Diet Prior to this Study: Regular;Thin liquids ?Temperature Spikes Noted: No ?Respiratory Status: Nasal cannula ?History of Recent Intubation: No ?Behavior/Cognition:  Alert;Cooperative;Pleasant mood ?Oral Cavity Assessment: Within Functional Limits ?Oral Care Completed by SLP: No ?Oral Cavity - Dentition: Missing dentition;Edentulous ?Vision: Functional for self-feeding ?Self-Feeding Abilities: Able to feed self ?Patient Positioning: Upright in chair ?Baseline Vocal Quality: Normal  ?  ?Oral/Motor/Sensory Function Overall Oral Motor/Sensory Function: Within functional limits   ?Ice Chips Ice chips: Not tested   ?Thin Liquid Thin Liquid: Within functional limits ?Presentation: Self Fed;Straw  ?  ?Nectar Thick Nectar Thick Liquid: Not tested   ?Honey Thick Honey Thick Liquid: Not tested   ?Puree Puree: Within functional limits ?Presentation: Self Fed;Spoon   ?Solid ? ? ?  Solid: Within functional limits ?Presentation: Self Fed  ? ?  ? ?Vaughan Sine ?01/17/2022,10:21 AM ? ? ? ?

## 2022-01-17 NOTE — Plan of Care (Signed)
?  Problem: Activity: ?Goal: Ability to tolerate increased activity will improve ?Outcome: Progressing ?  ?Problem: Respiratory: ?Goal: Levels of oxygenation will improve ?Outcome: Progressing ?  ?Problem: Respiratory: ?Goal: Ability to maintain adequate ventilation will improve ?Outcome: Progressing ?  ?Problem: Clinical Measurements: ?Goal: Respiratory complications will improve ?Outcome: Progressing ?  ?Problem: Clinical Measurements: ?Goal: Cardiovascular complication will be avoided ?Outcome: Progressing ?  ?

## 2022-01-17 NOTE — Plan of Care (Signed)
?  Problem: Education: ?Goal: Knowledge of disease or condition will improve ?Outcome: Progressing ?  ?Problem: Education: ?Goal: Knowledge of the prescribed therapeutic regimen will improve ?Outcome: Progressing ?  ?Problem: Activity: ?Goal: Ability to tolerate increased activity will improve ?Outcome: Progressing ?  ?Problem: Activity: ?Goal: Will verbalize the importance of balancing activity with adequate rest periods ?Outcome: Progressing ?  ?

## 2022-01-17 NOTE — Progress Notes (Signed)
Potassium 5.6, provider notified. 

## 2022-01-17 NOTE — Progress Notes (Signed)
Inpatient Diabetes Program Recommendations ? ?AACE/ADA: New Consensus Statement on Inpatient Glycemic Control (2015) ? ?Target Ranges:  Prepandial:   less than 140 mg/dL ?     Peak postprandial:   less than 180 mg/dL (1-2 hours) ?     Critically ill patients:  140 - 180 mg/dL  ? ?Lab Results  ?Component Value Date  ? GLUCAP 304 (H) 01/17/2022  ? HGBA1C 7.1 (H) 01/15/2022  ? ? ?Review of Glycemic Control ? Latest Reference Range & Units 01/16/22 07:21 01/16/22 11:20 01/16/22 16:25 01/16/22 20:01 01/17/22 05:44 01/17/22 12:00  ?Glucose-Capillary 70 - 99 mg/dL 154 (H) 220 (H) 175 (H) 122 (H) 200 (H) 304 (H)  ?(H): Data is abnormally high ? ?Diabetes history: DM2 ?Outpatient Diabetes medications: Metformin 500 mg bid ?Current orders for Inpatient glycemic control: Novolog correction 0-15 units tid, Solumedrol 120 mg bid ? ?Inpatient Diabetes Program Recommendations:   ?While on steroids, please consider: ?-Add Semglee 12 units qd (0.15 units/kg x 89.8 kg = 13 units) ?Secure chat sent to Dr. Grandville Silos. ? ?Thank you, ?Nani Gasser Keilee Denman, RN, MSN, CDE  ?Diabetes Coordinator ?Inpatient Glycemic Control Team ?Team Pager [phone] (8am-5pm) ?01/17/2022 1:15 PM ? ? ? ? ?

## 2022-01-17 NOTE — Progress Notes (Signed)
Nutrition Brief Note ? ?RD consulted for assessment of nutritional requirements/ status.  ? ?Wt Readings from Last 15 Encounters:  ?01/17/22 89.8 kg  ?09/17/21 81.6 kg  ?08/05/20 88.5 kg  ?07/03/20 90.7 kg  ?06/28/13 91.8 kg  ?06/07/13 93 kg  ?06/03/13 93.2 kg  ?05/14/13 94.3 kg  ? ?Patient 76 year old female history of COPD with ongoing tobacco use, chronic diastolic CHF, DM 2 presented to the ED with severe shortness of breath, cough, wheezing.  Patient noted to have sats of 63% on room air placed on a nonrebreather.  Patient received albuterol and Solu-Medrol by EMS and brought to the ED.  Chest x-ray done concerning for right lower lobe pneumonia.  ABG done with a respiratory acidosis with hypercarbia.  Patient admitted placed on the BiPAP and being treated for acute COPD exacerbation and CAP.  COVID-19 PCR, influenza PCR negative. ? ?Pt admitted with CAP and COPD exacerbation.  ? ?4/3- s/p BSE- advanced to regular diet with thin liquids  ? ?Reviewed I/O's: +131 ml x 24 hours and +722 ml since admission ? ?UOP: 750 ml x 24 hours ?  ?Pt unavailable at time of visit. Attempted to speak with pt via call to hospital room phone, however, unable to reach. RD unable to obtain further nutrition-related history or complete nutrition-focused physical exam at this time.   ? ?Lab Results  ?Component Value Date  ? HGBA1C 7.1 (H) 01/15/2022  ?  PTA DM medications are 500 mg metformin BID.  ? ?Labs reviewed: K: 5.2, CBGS: 200-304 (inpatient orders for glycemic control are 0-15 units inuslin aspart TID with meals and 12 units insulin glargine-yfgn daily).   ? ?Medications reviewed and include lasix and solu-medrol.  ? 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  ? ?Current diet order is carb modified, patient is consuming approximately 100% of meals at this time. Labs and medications reviewed.  ? ?No nutrition interventions warranted at this time. If nutrition issues  arise, please consult RD.  ? ?Loistine Chance, RD, LDN, CDCES ?Registered Dietitian II ?Certified Diabetes Care and Education Specialist ?Please refer to [facility] for RD and/or RD on-call/weekend/after hours pager   ?

## 2022-01-17 NOTE — Evaluation (Signed)
Physical Therapy Evaluation ?Patient Details ?Name: Deanna Petty ?MRN: [id number] ?DOB: 04-10-1946 ?Today's Date: 01/17/2022 ? ?History of Present Illness ? Pt is a 76 y.o. female who presented 01/15/22 with SOB and pt found to have SpO2 level of 63% on RA. Pt admitted with COPD exacerbation and CAP of RLL. PMH: COPD, DM2, chronic diastolic CHF ?  ?Clinical Impression ? Pt in bed upon arrival of PT, agreeable to evaluation at this time. Prior to admission the pt was independent with use of cane for mobility, reports independence with ADLs and some ability to assist family with IADLs. The pt now presents with limitations in functional mobility, endurance, dynamic stability, strength, and power due to above dx, and will continue to benefit from skilled PT to address these deficits. The pt was able to tolerate 6L O2 for all mobility with SpO2 >91%, and completed ~45 ft hallway ambulation with single UE support and minA to steady. Will continue to benefit from skilled PT acutely to progress functional endurance and stability to allow for safe return home. ?   ?   ? ?Recommendations for follow up therapy are one component of a multi-disciplinary discharge planning process, led by the attending physician.  Recommendations may be updated based on patient status, additional functional criteria and insurance authorization. ? ?Follow Up Recommendations Home health PT ? ?  ?Assistance Recommended at Discharge Intermittent Supervision/Assistance  ?Patient can return home with the following ? A little help with walking and/or transfers;A little help with bathing/dressing/bathroom;Assistance with cooking/housework;Help with stairs or ramp for entrance ? ?  ?Equipment Recommendations None recommended by PT  ?Recommendations for Other Services ?    ?  ?Functional Status Assessment Patient has had a recent decline in their functional status and demonstrates the ability to make significant improvements in function in a reasonable and  predictable amount of time.  ? ?  ?Precautions / Restrictions Precautions ?Precautions: Fall ?Precaution Comments: watch O2, on 6L for mobility ?Restrictions ?Weight Bearing Restrictions: No  ? ?  ? ?Mobility ? Bed Mobility ?Overal bed mobility: Needs Assistance ?Bed Mobility: Supine to Sit ?  ?  ?Supine to sit: Min guard ?  ?  ?General bed mobility comments: increased time and effort ?  ? ?Transfers ?Overall transfer level: Needs assistance ?Equipment used: 1 person hand held assist ?Transfers: Sit to/from Stand ?Sit to Stand: Min assist ?  ?  ?  ?  ?  ?General transfer comment: minA to steady, slow to rise ?  ? ?Ambulation/Gait ?Ambulation/Gait assistance: Min assist ?Gait Distance (Feet): 45 Feet ?Assistive device: 1 person hand held assist ?Gait Pattern/deviations: Step-through pattern, Decreased stride length, Narrow base of support ?Gait velocity: decreased ?Gait velocity interpretation: <1.31 ft/sec, indicative of household ambulator ?  ?General Gait Details: increased sway and lateral movement, minA to steady with HHA ? ? ?  ? ?Balance Overall balance assessment: Needs assistance ?Sitting-balance support: No upper extremity supported, Feet supported ?Sitting balance-Leahy Scale: Fair ?  ?  ?Standing balance support: Single extremity supported, During functional activity ?Standing balance-Leahy Scale: Fair ?  ?  ?  ?  ?  ?  ?  ?  ?  ?  ?  ?  ?   ? ? ? ?Pertinent Vitals/Pain Pain Assessment ?Pain Assessment: No/denies pain  ? ? ?Home Living Family/patient expects to be discharged to:: Private residence ?Living Arrangements: Other relatives ?Available Help at Discharge: Available PRN/intermittently;Family;Friend(s) ?Type of Home: House ?Home Access: Stairs to enter ?Entrance Stairs-Rails: None ?Entrance Stairs-Number of Steps:  2 ?  ?Home Layout: One level ?Home Equipment: Conservation officer, nature (2 wheels);Cane - quad ?   ?  ?Prior Function Prior Level of Function : Independent/Modified Independent ?  ?  ?  ?  ?  ?   ?Mobility Comments: pt reports ambulation with cane ?ADLs Comments: pt reports independent with ADLs, helps "some" with IADLs ?  ? ? ?Hand Dominance  ? Dominant Hand: Right ? ?  ?Extremity/Trunk Assessment  ? Upper Extremity Assessment ?Upper Extremity Assessment: Defer to OT evaluation ?  ? ?Lower Extremity Assessment ?Lower Extremity Assessment: Overall WFL for tasks assessed (redness on distal shin, pt states this is normal) ?  ? ?Cervical / Trunk Assessment ?Cervical / Trunk Assessment: Normal  ?Communication  ? Communication: No difficulties  ?Cognition Arousal/Alertness: Awake/alert ?Behavior During Therapy: [facility] for tasks assessed/performed ?Overall Cognitive Status: Impaired/Different from baseline ?Area of Impairment: Following commands, Problem solving ?  ?  ?  ?  ?  ?  ?  ?  ?  ?  ?  ?Following Commands: Follows one step commands with increased time ?  ?  ?Problem Solving: Slow processing ?General Comments: pt with slowed responses to all questions but then did answer appropriately. able to verbalize needs ?  ?  ? ?  ?General Comments General comments (skin integrity, edema, etc.): pt on 9L with SpO2 98% upon arrival, trialed 8L with good toleance and then 6L, pt sustained SpO2 >91% on 6L with mobility, left on 6L at end of session ? ?  ?Exercises    ? ?Assessment/Plan  ?  ?PT Assessment Patient needs continued PT services  ?PT Problem List Decreased activity tolerance;Decreased balance;Cardiopulmonary status limiting activity ? ?   ?  ?PT Treatment Interventions DME instruction;Gait training;Stair training;Functional mobility training;Therapeutic activities;Therapeutic exercise;Balance training;Patient/family education   ? ?PT Goals (Current goals can be found in the Care Plan section)  ?Acute Rehab PT Goals ?Patient Stated Goal: return home ?PT Goal Formulation: With patient ?Time For Goal Achievement: 01/31/22 ?Potential to Achieve Goals: Good ? ?  ?Frequency Min 3X/week ?  ? ? ?   ?AM-PAC PT "6  Clicks" Mobility  ?Outcome Measure Help needed turning from your back to your side while in a flat bed without using bedrails?: A Little ?Help needed moving from lying on your back to sitting on the side of a flat bed without using bedrails?: A Little ?Help needed moving to and from a bed to a chair (including a wheelchair)?: A Little ?Help needed standing up from a chair using your arms (e.g., wheelchair or bedside chair)?: A Little ?Help needed to walk in hospital room?: A Little ?Help needed climbing 3-5 steps with a railing? : A Lot ?6 Click Score: 17 ? ?  ?End of Session Equipment Utilized During Treatment: Gait belt;Oxygen ?Activity Tolerance: Patient tolerated treatment well ?Patient left: in chair;with call bell/phone within reach (SLP) ?Nurse Communication: Mobility status ?PT Visit Diagnosis: Other abnormalities of gait and mobility (R26.89) ?  ? ?Time: 0354-6568 ?PT Time Calculation (min) (ACUTE ONLY): 24 min ? ? ?Charges:   PT Evaluation ?$PT Eval Low Complexity: 1 Low ?  ?  ?   ? ? ? ?West Carbo, PT, DPT  ? ?Acute Rehabilitation Department ?Pager #: [phone] ? ?Sandra Cockayne ?01/17/2022, 10:38 AM ? ?

## 2022-01-17 NOTE — Evaluation (Signed)
Occupational Therapy Evaluation ?Patient Details ?Name: Deanna Petty ?MRN: [id number] ?DOB: Jan 16, 1946 ?Today's Date: 01/17/2022 ? ? ?History of Present Illness Pt is a 76 y.o. female who presented 01/15/22 with SOB and pt found to have SpO2 level of 63% on RA. Pt admitted with COPD exacerbation and CAP of RLL. PMH: COPD, DM2, chronic diastolic CHF  ? ?Clinical Impression ?  ?Prior to this admission, patient living with her cousin and cousin's husband, endorses she does not drive, and lives fairly sedentary lifestyle. She typically does not participate in housework or cooking, and engages in sedentary IADLs. Patient stating she is completing bird baths of late, but has shower seat and adaptive equipment (reacher, sock aid) from when she broke her hip years ago. Currently patient set up to min A for ADLs, but requiring 6L of oxygen to ambulate with decreased activity tolerance. Of note, Pt on 9L with SpO2 98% upon arrival, trialed 8L with good toleance and then 6L, pt sustained SpO2 >91% on 6L with mobility, left on 6L at end of session. OT recommending [facility] services at discharge, OT will continue to follow acutely.  ?   ? ?Recommendations for follow up therapy are one component of a multi-disciplinary discharge planning process, led by the attending physician.  Recommendations may be updated based on patient status, additional functional criteria and insurance authorization.  ? ?Follow Up Recommendations ? Home health OT  ?  ?Assistance Recommended at Discharge Set up Supervision/Assistance  ?Patient can return home with the following   ? ?  ?Functional Status Assessment ? Patient has had a recent decline in their functional status and demonstrates the ability to make significant improvements in function in a reasonable and predictable amount of time.  ?Equipment Recommendations ? Other (comment) (Will continue to assess)  ?  ?Recommendations for Other Services   ? ? ?  ?Precautions / Restrictions  Precautions ?Precautions: Fall ?Precaution Comments: watch O2, on 6L for mobility ?Restrictions ?Weight Bearing Restrictions: No  ? ?  ? ?Mobility Bed Mobility ?Overal bed mobility: Needs Assistance ?Bed Mobility: Supine to Sit ?  ?  ?Supine to sit: Min guard ?  ?  ?General bed mobility comments: increased time and effort ?  ? ?Transfers ?Overall transfer level: Needs assistance ?Equipment used: 1 person hand held assist ?Transfers: Sit to/from Stand ?Sit to Stand: Min assist ?  ?  ?  ?  ?  ?General transfer comment: minA to steady, slow to rise ?  ? ?  ?Balance Overall balance assessment: Needs assistance ?Sitting-balance support: No upper extremity supported, Feet supported ?Sitting balance-Leahy Scale: Fair ?  ?  ?Standing balance support: Single extremity supported, During functional activity ?Standing balance-Leahy Scale: Fair ?  ?  ?  ?  ?  ?  ?  ?  ?  ?  ?  ?  ?   ? ?ADL either performed or assessed with clinical judgement  ? ?ADL Overall ADL's : Needs assistance/impaired ?Eating/Feeding: NPO ?  ?Grooming: Set up;Sitting ?  ?Upper Body Bathing: Set up;Sitting ?  ?Lower Body Bathing: Minimal assistance;Sit to/from stand;Sitting/lateral leans ?  ?Upper Body Dressing : Set up;Sitting ?  ?Lower Body Dressing: Minimal assistance;Sitting/lateral leans;Sit to/from stand ?  ?Toilet Transfer: Minimal assistance;Ambulation ?  ?  ?  ?  ?  ?Functional mobility during ADLs: Minimal assistance ?   ? ? ? ?Vision Baseline Vision/History: 1 Wears glasses (Readers) ?Ability to See in Adequate Light: 0 Adequate ?Patient Visual Report: No change from baseline ?   ?   ?  Perception   ?  ?Praxis   ?  ? ?Pertinent Vitals/Pain Pain Assessment ?Pain Assessment: No/denies pain  ? ? ? ?Hand Dominance Right ?  ?Extremity/Trunk Assessment Upper Extremity Assessment ?Upper Extremity Assessment: Overall WFL for tasks assessed ?  ?Lower Extremity Assessment ?Lower Extremity Assessment: Overall WFL for tasks assessed (redness on distal shins,  pt states this is normal) ?  ?Cervical / Trunk Assessment ?Cervical / Trunk Assessment: Normal ?  ?Communication Communication ?Communication: No difficulties ?  ?Cognition Arousal/Alertness: Awake/alert ?Behavior During Therapy: [facility] for tasks assessed/performed ?Overall Cognitive Status: Impaired/Different from baseline ?Area of Impairment: Following commands, Problem solving ?  ?  ?  ?  ?  ?  ?  ?  ?  ?  ?  ?Following Commands: Follows one step commands with increased time ?  ?  ?Problem Solving: Slow processing ?General Comments: pt with slowed responses to all questions but then did answer appropriately. able to verbalize needs ?  ?  ?General Comments  pt on 9L with SpO2 98% upon arrival, trialed 8L with good toleance and then 6L, pt sustained SpO2 >91% on 6L with mobility, left on 6L at end of session ? ?  ?Exercises   ?  ?Shoulder Instructions    ? ? ?Home Living Family/patient expects to be discharged to:: Private residence ?Living Arrangements: Other relatives ?Available Help at Discharge: Available PRN/intermittently;Family;Friend(s) ?Type of Home: House ?Home Access: Stairs to enter ?Entrance Stairs-Number of Steps: 2 ?Entrance Stairs-Rails: None ?Home Layout: One level ?  ?  ?Bathroom Shower/Tub: Tub/shower unit;Curtain ?  ?Bathroom Toilet: Handicapped height ?  ?  ?Home Equipment: Conservation officer, nature (2 wheels);Cane - quad ?  ?  ?  ? ?  ?Prior Functioning/Environment Prior Level of Function : Independent/Modified Independent ?  ?  ?  ?  ?  ?  ?Mobility Comments: pt reports ambulation with cane ?ADLs Comments: pt reports independent with ADLs, helps "some" with IADLs ?  ? ?  ?  ?OT Problem List: Decreased activity tolerance;Impaired balance (sitting and/or standing);Cardiopulmonary status limiting activity;Decreased knowledge of precautions ?  ?   ?OT Treatment/Interventions: Self-care/ADL training;Therapeutic exercise;Energy conservation;Therapeutic activities  ?  ?OT Goals(Current goals can be found in the  care plan section) Acute Rehab OT Goals ?Patient Stated Goal: to get back home ?OT Goal Formulation: With patient ?Time For Goal Achievement: 01/31/22 ?Potential to Achieve Goals: Good ?ADL Goals ?Pt Will Perform Lower Body Dressing: Independently;with adaptive equipment;sitting/lateral leans;sit to/from stand ?Pt Will Transfer to Toilet: Independently;ambulating ?Pt Will Perform Toileting - Clothing Manipulation and hygiene: Independently;with adaptive equipment;sitting/lateral leans;sit to/from stand ?Additional ADL Goal #1: Patient will complete functional task in standing for 3-5 minutes without need for seated rest breaks for safe discharge home. ?Additional ADL Goal #2: Patient will verbalize 3 strategies of energy conservation for safe discharge home.  ?OT Frequency: Min 2X/week ?  ? ?Co-evaluation   ?  ?  ?  ?  ? ?  ?AM-PAC OT "6 Clicks" Daily Activity     ?Outcome Measure Help from another person eating meals?: Total (NPO) ?Help from another person taking care of personal grooming?: A Little ?Help from another person toileting, which includes using toliet, bedpan, or urinal?: A Little ?Help from another person bathing (including washing, rinsing, drying)?: A Little ?Help from another person to put on and taking off regular upper body clothing?: A Little ?Help from another person to put on and taking off regular lower body clothing?: A Little ?6 Click Score: 16 ?  ?End  of Session Equipment Utilized During Treatment: Gait belt;Oxygen ?Nurse Communication: Mobility status;Other (comment) (Able to wean O2 to 6) ? ?Activity Tolerance: Patient tolerated treatment well ?Patient left: in chair;with call bell/phone within reach;with nursing/sitter in room ? ?OT Visit Diagnosis: Unsteadiness on feet (R26.81);Other abnormalities of gait and mobility (R26.89);Muscle weakness (generalized) (M62.81)  ?              ?Time: 9675-9163 ?OT Time Calculation (min): 21 min ?Charges:  OT General Charges ?$OT Visit: 1 Visit ?OT  Evaluation ?$OT Eval Moderate Complexity: 1 Mod ? ?Corinne Ports E. Michaelpaul Apo, OTR/L ?Acute Rehabilitation Services ?[phone] ?[phone]  ? ?Corinne Ports Para Cossey ?01/17/2022, 11:13 AM ?

## 2022-01-17 NOTE — Progress Notes (Signed)
Patient was on 11L O2 via nasal cannula for about 40mns to take her meds. Each time she's been off of bipap, her sats remain WNL.  ? ?ABG done and resulted. ? ?

## 2022-01-18 LAB — BASIC METABOLIC PANEL
Anion gap: 6 (ref 5–15)
BUN: 28 mg/dL — ABNORMAL HIGH (ref 8–23)
CO2: 34 mmol/L — ABNORMAL HIGH (ref 22–32)
Calcium: 9 mg/dL (ref 8.9–10.3)
Chloride: 98 mmol/L (ref 98–111)
Creatinine, Ser: 0.92 mg/dL (ref 0.44–1.00)
GFR, Estimated: >60 mL/min (ref >60)
Glucose, Bld: 214 mg/dL — ABNORMAL HIGH (ref 70–99)
Potassium: 4.9 mmol/L (ref 3.5–5.1)
Sodium: 138 mmol/L (ref 135–145)

## 2022-01-18 LAB — GLUCOSE, CAPILLARY
Glucose-Capillary: 237 mg/dL — ABNORMAL HIGH (ref 70–99)
Glucose-Capillary: 160 mg/dL — ABNORMAL HIGH (ref 70–99)
Glucose-Capillary: 214 mg/dL — ABNORMAL HIGH (ref 70–99)
Glucose-Capillary: 242 mg/dL — ABNORMAL HIGH (ref 70–99)

## 2022-01-18 LAB — CBC WITH DIFFERENTIAL/PLATELET
Abs Immature Granulocytes: 0.19 10*3/uL — ABNORMAL HIGH (ref 0.00–0.07)
Basophils Absolute: 0 10*3/uL (ref 0.0–0.1)
Basophils Relative: 0 %
Eosinophils Absolute: 0 10*3/uL (ref 0.0–0.5)
Eosinophils Relative: 0 %
HCT: 42.2 % (ref 36.0–46.0)
Hemoglobin: 13.5 g/dL (ref 12.0–15.0)
Immature Granulocytes: 2 %
Lymphocytes Relative: 6 %
Lymphs Abs: 0.6 10*3/uL — ABNORMAL LOW (ref 0.7–4.0)
MCH: 30.7 pg (ref 26.0–34.0)
MCHC: 32 g/dL (ref 30.0–36.0)
MCV: 95.9 fL (ref 80.0–100.0)
Monocytes Absolute: 0.3 10*3/uL (ref 0.1–1.0)
Monocytes Relative: 3 %
Neutro Abs: 9.7 10*3/uL — ABNORMAL HIGH (ref 1.7–7.7)
Neutrophils Relative %: 89 %
Platelets: 182 10*3/uL (ref 150–400)
RBC: 4.4 MIL/uL (ref 3.87–5.11)
RDW: 15.2 % (ref 11.5–15.5)
WBC: 10.8 10*3/uL — ABNORMAL HIGH (ref 4.0–10.5)
nRBC: 0 % (ref 0.0–0.2)

## 2022-01-18 LAB — LEGIONELLA PNEUMOPHILA SEROGP 1 UR AG: L. pneumophila Serogp 1 Ur Ag: NEGATIVE

## 2022-01-18 LAB — MAGNESIUM: Magnesium: 2 mg/dL (ref 1.7–2.4)

## 2022-01-18 MED ORDER — INSULIN GLARGINE-YFGN 100 UNIT/ML ~~LOC~~ SOLN
16.0000 [IU] | Freq: Every day | SUBCUTANEOUS | Status: DC
  Administered 2022-01-18 – 2022-01-20 (×3): 16 [IU] via SUBCUTANEOUS
  Filled 2022-01-18 (×3): qty 0.16

## 2022-01-18 MED ORDER — AMOXICILLIN-POT CLAVULANATE 875-125 MG PO TABS
1.0000 | ORAL_TABLET | Freq: Two times a day (BID) | ORAL | Status: DC
Start: 2022-01-19 — End: 2022-01-20
  Administered 2022-01-19 – 2022-01-20 (×3): 1 via ORAL
  Filled 2022-01-18 (×3): qty 1

## 2022-01-18 NOTE — Progress Notes (Signed)
?PROGRESS NOTE ? ? ? ?Deanna Petty  OIN:[id number] DOB: Nov 09, 1945 DOA: 01/15/2022 ?PCP: Arthur Holms, NP  ? ? ?Chief Complaint  ?Patient presents with  ? Shortness of Breath  ? ? ?Brief Narrative:  ?Patient 76 year old female history of COPD with ongoing tobacco use, chronic diastolic CHF, DM 2 presented to the ED with severe shortness of breath, cough, wheezing.  Patient noted to have sats of 63% on room air placed on a nonrebreather.  Patient received albuterol and Solu-Medrol by EMS and brought to the ED.  Chest x-ray done concerning for right lower lobe pneumonia.  ABG done with a respiratory acidosis with hypercarbia.  Patient admitted placed on the BiPAP and being treated for acute COPD exacerbation and CAP.  COVID-19 PCR, influenza PCR negative.  ? ? ?Assessment & Plan: ? Principal Problem: ?  Acute hypoxemic respiratory failure (HCC) ?Active Problems: ?  COPD exacerbation ([place]) ?  Tobacco use ?  CAP (community acquired pneumonia) ?  Type 2 diabetes mellitus without complication (HCC) ?  Acute on chronic diastolic CHF (congestive heart failure) ([place]) ?  Hyperlipidemia ?  Abnormal EKG ?  Essential hypertension ?  Hypomagnesemia ?  Acute respiratory failure with hypoxia and hypercapnia (HCC) ?  Hyperkalemia ? ? ? ?Assessment and Plan: ?* Acute hypoxemic respiratory failure (HCC) ?- Patient presented with acute hypoxemic and hypercarbic respiratory failure felt likely multifactorial secondary to acute COPD exacerbation and community-acquired pneumonia and acute diastolic CHF exacerbation/probable volume overload. ?-Patient had presented with hypoxia with sats of 63% on room air, shortness of breath, cough, diffuse wheezing. ?-ABG obtained with a pH of 7.24/PCO2 of 78/PO2 of 59/bicarb of 33. ?-Patient was on BiPAP overnight currently on high flow nasal cannula salter 5 L with sats of 96%. ?-Repeat ABG 01/17/2022, with a pH of 7.32, PCO2 of 63, PO2 of 96, bicarb of 33. ?-Urine strep pneumococcus antigen negative.   ?-Urine Legionella antigen negative. ?-COVID-19 PCR, influenza PCR negative. ?-Respiratory viral panel negative. ?-Continue empiric IV Rocephin, azithromycin, scheduled DuoNebs, Pulmicort, Flonase, Claritin, Mucinex, Brovana. ?-Decrease IV Solu-Medrol 120 mg daily. ?-Patient noted to have elevated BNP, volume overload on examination and placed on Lasix 40 mg IV every 12 hours with urine output of 3.750 L over the past 24 hours. ?-Current weight of 197.75 pounds.  Unsure of accuracy of weights. ?-Continue BiPAP nightly. ?-Supportive care. ? ? ?COPD exacerbation ([place]) ?- Patient presented with cough, wheezing, shortness of breath, hypoxia. ?-ABG with a respiratory acidosis with hypoxia and hypercarbia. ?-Patient with ongoing tobacco use. ?-Continue scheduled DuoNebs, Mucinex, Pulmicort, Flonase, Claritin, PPI, Brovana. ?-Decrease IV Solu-Medrol to '120mg'$  daily. ?-Tobacco cessation stressed to patient. ?-Patient tolerated BiPAP overnight and currently off BiPAP. ?-Keep off BiPAP today and placed on BiPAP nightly for now. ?-Will need outpatient follow-up with pulmonary postdischarge. ? ? ?CAP (community acquired pneumonia) ?- Noted on chest x-ray. ?-Urine strep pneumococcus antigen negative. ?-Urine Legionella antigen negative. ?-SLP evaluation. ?-Continue empiric IV Rocephin, Mucinex.  ?-Transition from IV Rocephin to oral Augmentin tomorrow.  -Complete azithromycin course tomorrow.   ?-Continue scheduled nebs.  ? ?Tobacco use ?- Tobacco cessation stressed to patient ?-Continue nicotine patch. ? ?Type 2 diabetes mellitus without complication ([place]) ?- Hemoglobin A1c 7.1 (01/15/2022). ?-CBG 237 this morning. ?-Continue to hold home regimen oral hypoglycemic agents. ?-Increase Semglee to 16 units daily.   ?-SSI.   ?-Monitor CBGs with steroid taper.   ?-May need to start NovoLog meal coverage.    ? ?Hyperlipidemia ?- Continue home regimen statin. ? ?Acute  on chronic diastolic CHF (congestive heart failure) ([place]) ?- Concern  for probable volume overload as patient noted to be volume overloaded on examination, elevated BNP on admission. ?-Continue to hold Cozaar. ?-Continue Toprol-XL. ?-Due to increased O2 requirements and hypoxia patient placed on Lasix 40 mg IV every 12 hours with a urine output of 3.750 L over the past 24 hours.   ?-Daily weights inaccurate.   ?-Clinical improvement.   ?-Continue IV Lasix, strict I's and O's, daily weights.   ?-Patient will need to be discharged home on oral diuretics with outpatient follow-up with cardiology once clinically improved and euvolemic.  ? ?Hyperkalemia ?- Repeat potassium of 4.9. ?-Repeat labs in the a.m. ? ?Hypomagnesemia ?- Repleted.  ?-Magnesium at 2.0. ?-Repeat labs in AM. ? ?Essential hypertension ?- Continue Norvasc, Toprol-XL. ? ?Abnormal EKG ?- Patient with abnormal EKG however similar to prior EKG. ?-Patient with no overt chest pain ?-2D echo with EF > 75%, NWMA, mild LVH, indeterminate diastolic left ventricular parameters.  Mild to moderate AS. ?-Continue IV diuretics. ?-Will need outpatient follow-up with cardiology. ? ? ? ? ?  ? ? ?DVT prophylaxis: Lovenox ?Code Status: Full ?Family Communication: Updated patient.  No family at bedside. ?Disposition: TBD ? ?Status is: Inpatient ?Remains inpatient appropriate because: Severity of illness ?  ?Consultants:  ?None ? ?Procedures:  ?Chest x-ray 01/15/2022 ? ?Antimicrobials:  ?IV azithromycin 01/15/2022 >>>>> ?IV Rocephin 01/15/2022 >>>>> ? ? ?Subjective: ?Patient sitting up onside of bed on 4-5L hIGH FLOW [place].  Patient states overall feeling better than she did on admission.  Denies any chest pain.  Denies any prior history of being followed by cardiology with a history of CHF.  Tolerated BiPAP machine overnight. ? ?Objective: ?Vitals:  ? 01/18/22 0851 01/18/22 0930 01/18/22 1134 01/18/22 1350  ?BP:   (!) 122/45   ?Pulse:   70   ?Resp:   20   ?Temp:   98.7 ?F (37.1 ?C)   ?TempSrc:   Oral   ?SpO2: 98% 96% 96% 96%  ?Weight:      ?Height:       ? ? ?Intake/Output Summary (Last 24 hours) at 01/18/2022 1859 ?Last data filed at 01/18/2022 1235 ?Gross per 24 hour  ?Intake 1069.38 ml  ?Output 3800 ml  ?Net -2730.62 ml  ? ?Filed Weights  ? 01/16/22 0402 01/17/22 0415 01/18/22 0014  ?Weight: 88.4 kg 89.8 kg 89.7 kg  ? ? ?Examination: ? ?General exam: NAD. ?Respiratory system: Diffuse scattered crackles.  Minimal expiratory wheezing.  Fair air movement.  Speaking in full sentences. ?No significant use of accessory muscles of respiration.  On Salter high flow nasal cannula at 5 L. ?Cardiovascular system: RRR.  Positive JVD.  No significant lower extremity edema. 2-3/6 SEM. ?Gastrointestinal system: Abdomen is soft, nontender, nondistended, positive bowel sounds.  No rebound.  No guarding.   ?Central nervous system: Alert and oriented. No focal neurological deficits. ?Extremities: Symmetric 5 x 5 power. ?Skin: No rashes, lesions or ulcers ?Psychiatry: Judgement and insight appear normal. Mood & affect appropriate.  ? ? ? ?Data Reviewed:  ? ?CBC: ?Recent Labs  ?Lab 01/15/22 ?1721 01/15/22 ?1827 01/15/22 ?1959 01/16/22 ?0426 01/17/22 ?5974 01/18/22 ?0421  ?WBC 11.9*  --  10.3 8.9 13.3* 10.8*  ?NEUTROABS 7.7  --  9.4* 8.0* 12.3* 9.7*  ?HGB 15.3* 16.3* 13.6 13.7 13.2 13.5  ?HCT 50.0* 48.0* 43.6 43.7 42.5 42.2  ?MCV 99.6  --  100.2* 98.4 99.3 95.9  ?PLT 171  --  133* 172 187 182  ? ? ?  Basic Metabolic Panel: ?Recent Labs  ?Lab 01/15/22 ?1721 01/15/22 ?1827 01/16/22 ?0426 01/17/22 ?8185 01/17/22 ?6314 01/18/22 ?0421  ?NA 137 136 137 135  --  138  ?K 4.4 4.6 5.2* 5.6* 5.2* 4.9  ?CL 98  --  101 99  --  98  ?CO2 33*  --  30 31  --  34*  ?GLUCOSE 149*  --  158* 184*  --  214*  ?BUN 8  --  10 23  --  28*  ?CREATININE 0.70  --  0.67 0.93  --  0.92  ?CALCIUM 9.7  --  9.3 8.9  --  9.0  ?MG 1.7  --  1.6* 2.3  --  2.0  ?PHOS 4.3  --  4.3  --   --   --   ? ? ?GFR: ?Estimated Creatinine Clearance: 55.8 mL/min (by C-G formula based on SCr of 0.92 mg/dL). ? ?Liver Function Tests: ?Recent  Labs  ?Lab 01/15/22 ?1721 01/16/22 ?0426  ?AST 54* 29  ?ALT 46* 37  ?ALKPHOS 86 68  ?BILITOT 0.9 0.7  ?PROT 7.8 6.8  ?ALBUMIN 3.7 3.2*  ? ? ?CBG: ?Recent Labs  ?Lab 01/17/22 ?1639 01/17/22 ?2105 01/18/22

## 2022-01-18 NOTE — Progress Notes (Signed)
Physical Therapy Treatment ?Patient Details ?Name: Deanna Petty ?MRN: [id number] ?DOB: 1945-12-31 ?Today's Date: 01/18/2022 ? ? ?History of Present Illness Pt is a 76 y.o. female who presented 01/15/22 with SOB and pt found to have SpO2 level of 63% on RA. Pt admitted with COPD exacerbation and CAP of RLL. PMH: COPD, DM2, chronic diastolic CHF ? ?  ?PT Comments  ? ? Patient received sitting on side of bed, reports she is feeling good, wants to go home. She is agreeable to PT session. Patient ambulated with single HHA on 5 liters O2. Saturations remained > 94% throughout ambulation. Patient decreased to 4 liters O2 when left in room. She will continue to benefit from skilled PT while here to improve strength and activity tolerance for safe return home at discharge.  ?    ?Recommendations for follow up therapy are one component of a multi-disciplinary discharge planning process, led by the attending physician.  Recommendations may be updated based on patient status, additional functional criteria and insurance authorization. ? ?Follow Up Recommendations ? Home health PT ?  ?  ?Assistance Recommended at Discharge Intermittent Supervision/Assistance  ?Patient can return home with the following A little help with walking and/or transfers;A little help with bathing/dressing/bathroom;Assistance with cooking/housework;Help with stairs or ramp for entrance ?  ?Equipment Recommendations ? None recommended by PT  ?  ?Recommendations for Other Services   ? ? ?  ?Precautions / Restrictions Precautions ?Precautions: Fall ?Restrictions ?Weight Bearing Restrictions: No  ?  ? ?Mobility ? Bed Mobility ?  ?  ?  ?  ?  ?  ?  ?General bed mobility comments: received sitting up on side of bed and left on side of bed ?  ? ?Transfers ?Overall transfer level: Modified independent ?Equipment used: None ?Transfers: Sit to/from Stand ?Sit to Stand: Modified independent (Device/Increase time) ?  ?  ?  ?  ?  ?General transfer comment: increased time,  no assist needed ?  ? ?Ambulation/Gait ?Ambulation/Gait assistance: Min assist ?Gait Distance (Feet): 75 Feet ?Assistive device: 1 person hand held assist ?Gait Pattern/deviations: Step-through pattern, Decreased step length - right, Decreased step length - left ?Gait velocity: decreased ?  ?  ?General Gait Details: min guard initially to min assist as she fatigued with HHA. One brief standing rest at turning point. O2 sats remained in mid 90%s with ambulation on 5 liters. Decreased to 4 liters when left seated in room. ? ? ?Stairs ?  ?  ?  ?  ?  ? ? ?Wheelchair Mobility ?  ? ?Modified Rankin (Stroke Patients Only) ?  ? ? ?  ?Balance Overall balance assessment: Needs assistance ?Sitting-balance support: Feet supported ?Sitting balance-Leahy Scale: Normal ?  ?  ?Standing balance support: Single extremity supported, During functional activity ?Standing balance-Leahy Scale: Fair ?  ?  ?  ?  ?  ?  ?  ?  ?  ?  ?  ?  ?  ? ?  ?Cognition Arousal/Alertness: Awake/alert ?Behavior During Therapy: [facility] for tasks assessed/performed ?Overall Cognitive Status: Within Functional Limits for tasks assessed ?  ?  ?  ?  ?  ?  ?  ?  ?  ?  ?  ?  ?  ?  ?  ?  ?  ?  ?  ? ?  ?Exercises   ? ?  ?General Comments   ?  ?  ? ?Pertinent Vitals/Pain Pain Assessment ?Pain Assessment: No/denies pain  ? ? ?Home Living   ?  ?  ?  ?  ?  ?  ?  ?  ?  ?   ?  ?  Prior Function    ?  ?  ?   ? ?PT Goals (current goals can now be found in the care plan section) Acute Rehab PT Goals ?Patient Stated Goal: return home ?PT Goal Formulation: With patient ?Time For Goal Achievement: 01/31/22 ?Potential to Achieve Goals: Good ?Progress towards PT goals: Progressing toward goals ? ?  ?Frequency ? ? ? Min 3X/week ? ? ? ?  ?PT Plan Current plan remains appropriate  ? ? ?Co-evaluation   ?  ?  ?  ?  ? ?  ?AM-PAC PT "6 Clicks" Mobility   ?Outcome Measure ? Help needed turning from your back to your side while in a flat bed without using bedrails?: A Little ?Help needed moving  from lying on your back to sitting on the side of a flat bed without using bedrails?: A Little ?Help needed moving to and from a bed to a chair (including a wheelchair)?: A Little ?Help needed standing up from a chair using your arms (e.g., wheelchair or bedside chair)?: A Little ?Help needed to walk in hospital room?: A Little ?Help needed climbing 3-5 steps with a railing? : A Lot ?6 Click Score: 17 ? ?  ?End of Session Equipment Utilized During Treatment: Gait belt;Oxygen ?Activity Tolerance: Patient tolerated treatment well ?Patient left: Other (comment);with call bell/phone within reach (seated on side of bed) ?Nurse Communication: Mobility status;Other (comment) (decreased O2) ?PT Visit Diagnosis: Muscle weakness (generalized) (M62.81) ?  ? ? ?Time: 3335-4562 ?PT Time Calculation (min) (ACUTE ONLY): 13 min ? ?Charges:  $Gait Training: 8-22 mins          ?          ? ?[employer], PT, [employer] ?01/18/22,9:37 AM ? ?

## 2022-01-18 NOTE — Plan of Care (Signed)
?  Problem: Activity: ?Goal: Ability to tolerate increased activity will improve ?Outcome: Progressing ?  ?Problem: Activity: ?Goal: Will verbalize the importance of balancing activity with adequate rest periods ?Outcome: Progressing ?  ?Problem: Respiratory: ?Goal: Levels of oxygenation will improve ?Outcome: Progressing ?  ?Problem: Respiratory: ?Goal: Ability to maintain adequate ventilation will improve ?Outcome: Progressing ?  ?Problem: Activity: ?Goal: Capacity to carry out activities will improve ?Outcome: Progressing ?  ?

## 2022-01-18 NOTE — Progress Notes (Signed)
Patient weaned down to 5L this morning so far. Oxygen saturations are 99% currently. ?

## 2022-01-19 DIAGNOSIS — I5033 Acute on chronic diastolic (congestive) heart failure: Secondary | ICD-10-CM

## 2022-01-19 LAB — CBC
HCT: 42.1 % (ref 36.0–46.0)
Hemoglobin: 13.8 g/dL (ref 12.0–15.0)
MCH: 31.3 pg (ref 26.0–34.0)
MCHC: 32.8 g/dL (ref 30.0–36.0)
MCV: 95.5 fL (ref 80.0–100.0)
Platelets: 184 10*3/uL (ref 150–400)
RBC: 4.41 MIL/uL (ref 3.87–5.11)
RDW: 14.9 % (ref 11.5–15.5)
WBC: 10.1 10*3/uL (ref 4.0–10.5)
nRBC: 0 % (ref 0.0–0.2)

## 2022-01-19 LAB — BASIC METABOLIC PANEL
Anion gap: 8 (ref 5–15)
BUN: 33 mg/dL — ABNORMAL HIGH (ref 8–23)
CO2: 34 mmol/L — ABNORMAL HIGH (ref 22–32)
Calcium: 8.9 mg/dL (ref 8.9–10.3)
Chloride: 94 mmol/L — ABNORMAL LOW (ref 98–111)
Creatinine, Ser: 0.92 mg/dL (ref 0.44–1.00)
GFR, Estimated: >60 mL/min (ref >60)
Glucose, Bld: 192 mg/dL — ABNORMAL HIGH (ref 70–99)
Potassium: 4.8 mmol/L (ref 3.5–5.1)
Sodium: 136 mmol/L (ref 135–145)

## 2022-01-19 LAB — GLUCOSE, CAPILLARY
Glucose-Capillary: 205 mg/dL — ABNORMAL HIGH (ref 70–99)
Glucose-Capillary: 280 mg/dL — ABNORMAL HIGH (ref 70–99)
Glucose-Capillary: 172 mg/dL — ABNORMAL HIGH (ref 70–99)
Glucose-Capillary: 273 mg/dL — ABNORMAL HIGH (ref 70–99)

## 2022-01-19 LAB — MAGNESIUM: Magnesium: 1.9 mg/dL (ref 1.7–2.4)

## 2022-01-19 MED ORDER — IPRATROPIUM-ALBUTEROL 0.5-2.5 (3) MG/3ML IN SOLN: 3.0000 mL | Freq: Two times a day (BID) | RESPIRATORY_TRACT | Status: DC

## 2022-01-19 MED ORDER — IPRATROPIUM-ALBUTEROL 0.5-2.5 (3) MG/3ML IN SOLN
3.0000 mL | Freq: Four times a day (QID) | RESPIRATORY_TRACT | Status: DC
  Administered 2022-01-19 – 2022-01-20 (×3): 3 mL via RESPIRATORY_TRACT
  Filled 2022-01-19 (×3): qty 3

## 2022-01-19 MED ORDER — PREDNISONE 20 MG PO TABS
20.0000 mg | ORAL_TABLET | Freq: Every day | ORAL | Status: DC
  Administered 2022-01-20: 20 mg via ORAL
  Filled 2022-01-19: qty 1

## 2022-01-19 NOTE — Assessment & Plan Note (Addendum)
Acute hypoxemic/ hypercapnic respiratory failure.  ? ?Patient was placed on systemic/ inhaled corticosteroids and aggressive bronchodilator therapy.  ?Antibiotic therapy for right lower lobe community acquired pneumonia.  ?Her oxygenation and work of breathing improved. ?She was liberated from non invasive mechanical ventilation and transitioned to nasal cannula with good toleration.  ? ?At the time of her discharge her oxymetry is 100% on 3 L/min per [place] ?Will continue antibiotic therapy with Augmentin for 2 more days. ?Continue bronchodilator therapy at home.  ?Smoking cessation, and continue nicotine patch at home.  ?

## 2022-01-19 NOTE — Progress Notes (Signed)
Occupational Therapy Treatment ?Patient Details ?Name: Deanna Petty ?MRN: [id number] ?DOB: Feb 21, 1946 ?Today's Date: 01/19/2022 ? ? ?History of present illness Pt is a 76 y.o. female who presented 01/15/22 with SOB and pt found to have SpO2 level of 63% on RA. Pt admitted with COPD exacerbation and CAP of RLL. PMH: COPD, DM2, chronic diastolic CHF ?  ?OT comments ? Patient continues to make steady progress towards goals in skilled OT session. Patient's session encompassed  therapeutic exercises to increase activity tolerance and energy conservation strategies. Patient able to complete exercises with seated rest breaks in between with oxygen saturations stable on 3L. Patient provided with energy conservation handout at end of session. OT will continue to follow.   ? ?Recommendations for follow up therapy are one component of a multi-disciplinary discharge planning process, led by the attending physician.  Recommendations may be updated based on patient status, additional functional criteria and insurance authorization. ?   ?Follow Up Recommendations ? Home health OT  ?  ?Assistance Recommended at Discharge Set up Supervision/Assistance  ?Patient can return home with the following ? A little help with bathing/dressing/bathroom;Assist for transportation;Assistance with cooking/housework;Help with stairs or ramp for entrance ?  ?Equipment Recommendations ? None recommended by OT  ?  ?Recommendations for Other Services   ? ?  ?Precautions / Restrictions Precautions ?Precautions: Fall ?Precaution Comments: watch O2 ?Restrictions ?Weight Bearing Restrictions: No  ? ? ?  ? ?Mobility Bed Mobility ?  ?  ?  ?  ?  ?  ?  ?General bed mobility comments: received sitting up on side of bed and left on side of bed ?  ? ?Transfers ?Overall transfer level: Modified independent ?Equipment used: None ?Transfers: Sit to/from Stand ?Sit to Stand: Modified independent (Device/Increase time) ?  ?  ?  ?  ?  ?General transfer comment: increased  time, no assist needed ?  ?  ?Balance Overall balance assessment: Needs assistance ?Sitting-balance support: Feet supported ?Sitting balance-Leahy Scale: Normal ?  ?  ?Standing balance support: Single extremity supported, During functional activity ?Standing balance-Leahy Scale: Fair ?  ?  ?  ?  ?  ?  ?  ?  ?  ?  ?  ?  ?   ? ?ADL either performed or assessed with clinical judgement  ? ?ADL Overall ADL's : Needs assistance/impaired ?  ?  ?  ?  ?  ?  ?  ?  ?  ?  ?  ?  ?  ?  ?  ?  ?  ?  ?  ?General ADL Comments: Session focus on increasing activity tolerance for safe discharge home ?  ? ?Extremity/Trunk Assessment   ?  ?  ?  ?  ?  ? ?Vision   ?  ?  ?Perception   ?  ?Praxis   ?  ? ?Cognition Arousal/Alertness: Awake/alert ?Behavior During Therapy: [facility] for tasks assessed/performed ?Overall Cognitive Status: Within Functional Limits for tasks assessed ?  ?  ?  ?  ?  ?  ?  ?  ?  ?  ?  ?  ?  ?  ?  ?  ?  ?  ?  ?   ?Exercises General Exercises - Lower Extremity ?Straight Leg Raises: AROM, Both, 20 reps, Seated ?Hip Flexion/Marching: AROM, Both, 20 reps, Seated ?Other Exercises ?Other Exercises: sit<>stands, one set of 6, one set of 8 ? ?  ?Shoulder Instructions   ? ? ?  ?General Comments    ? ? ?Pertinent  Vitals/ Pain       Pain Assessment ?Pain Assessment: No/denies pain ? ?Home Living   ?  ?  ?  ?  ?  ?  ?  ?  ?  ?  ?  ?  ?  ?  ?  ?  ?  ?  ? ?  ?Prior Functioning/Environment    ?  ?  ?  ?   ? ?Frequency ? Min 2X/week  ? ? ? ? ?  ?Progress Toward Goals ? ?OT Goals(current goals can now be found in the care plan section) ? Progress towards OT goals: Progressing toward goals ? ?Acute Rehab OT Goals ?Patient Stated Goal: to get back home ?OT Goal Formulation: With patient ?Time For Goal Achievement: 01/31/22 ?Potential to Achieve Goals: Good  ?Plan Discharge plan remains appropriate   ? ?Co-evaluation ? ? ?   ?  ?  ?  ?  ? ?  ?AM-PAC OT "6 Clicks" Daily Activity     ?Outcome Measure ? ? Help from another person eating meals?: A  Little ?Help from another person taking care of personal grooming?: A Little ?Help from another person toileting, which includes using toliet, bedpan, or urinal?: A Little ?Help from another person bathing (including washing, rinsing, drying)?: A Little ?Help from another person to put on and taking off regular upper body clothing?: A Little ?Help from another person to put on and taking off regular lower body clothing?: A Little ?6 Click Score: 18 ? ?  ?End of Session Equipment Utilized During Treatment: Oxygen ? ?OT Visit Diagnosis: Unsteadiness on feet (R26.81);Other abnormalities of gait and mobility (R26.89);Muscle weakness (generalized) (M62.81) ?  ?Activity Tolerance Patient tolerated treatment well ?  ?Patient Left in bed;with call bell/phone within reach (sitting EOB) ?  ?Nurse Communication Mobility status ?  ? ?   ? ?Time: 0932-6712 ?OT Time Calculation (min): 17 min ? ?Charges: OT General Charges ?$OT Visit: 1 Visit ?OT Treatments ?$Therapeutic Exercise: 8-22 mins ? ?Deanna Ports E. Ashleen Demma, OTR/L ?Acute Rehabilitation Services ?[phone] ?[phone]  ? ?Deanna Petty ?01/19/2022, 3:40 PM ?

## 2022-01-19 NOTE — Progress Notes (Addendum)
?Progress Note ? ? ?Patient: Deanna Petty TOI:[id number] DOB: [birth year] DOA: 01/15/2022     4 ?DOS: the patient was seen and examined on 01/19/2022 ?  ?Brief hospital course: ?Deanna Petty was admitted to the hospital with the working diagnosis of decompensated heart failure.  ? ?Patient 76 year old female history of COPD with ongoing tobacco use, chronic diastolic CHF, DM 2 presented to the ED with severe shortness of breath, cough, wheezing.  Patient noted to have sats of 63% on room air placed on a nonrebreather.  Patient received albuterol and Solu-Medrol by EMS and brought to the ED.  On her initial physical examination her blood pressure was 1125/104, HR 97, RR 23, 02 saturation 92%, heart with S1 and S2 present and rhythmic, lungs with wheezing and rales, abdomen not distended and no lower extremity edema.  ? ?ABG 7.23/ 77.8/ 59/ 36/ 84%  ?Na 137, K 4,4 CL 98, bicarbonate 33, glucose 149, bun 8 cr 0,70 ?BNP 380 ?Wbc 11.9, hgb 15,3 hct 50, plt 171 ?Sars covid 19 negative  ? ?Chest radiograph with bilateral interstitial infiltrates, predominantly at the right lower lobe (more dense).  ? ?EKG 92 bpm, right axis, normal qtx, sinus rhythm with poor R wave progression, no ST segment changes, negative T wave lead II, III, Avf, V4 to V6.  ? ?Patient was placed on non invasive mechanical ventilation, received systemic steroid and antibiotic therapy.  ?Added diuresis for volume overload.  ? ? ?Assessment and Plan: ?COPD exacerbation ([place]) ?Acute hypoxemic respiratory failure.  ? ?Respiratory status has improved, her oxygenation is 76 yo 97 on 3 L/min per [place] ?No wheezing. ? ?Plan to continue medical therapy with bronchodilator therapy and airway clearing techniques.  ?Taper systemic steroids and continue with inhaled steroids.   ?Continue antibiotic therapy for community acquired pneumonia right lower lobe with Augmentin.  ?Ok to discontinue Bipap. ?Out of bed to chair tid with meals, PT and Ot.  ? ?CAP (community acquired  pneumonia) ?Continue antibiotic therapy with Augmentin for 5 days.  ?Continue oxymetry monitoring ?No sepsis (on admission)  ? ?Acute on chronic diastolic CHF (congestive heart failure) ([place]) ?Acute diastolic heart failure.  ?Echocardiogram with preserved LV systolic function with EF >75%, mild LVH, preserved RV systolic function. Mild to moderate aortic stenosis.  ? ?Improved volume status, will hold on furosemide for now. ?Continue blood pressure monitoring.  ? ?Type 2 diabetes mellitus with hyperlipidemia ([place]) ?Continue glucose cover and monitoring with insulin sliding scale and basal insulin. ?Taper steroids ? ?Continue with statin therapy.     ? ?Tobacco use ?- Tobacco cessation stressed to patient ?-Continue nicotine patch. ? ?Hypomagnesemia ?Hyperkalemia. ? ?Electrolytes with Na 132, K is 4,8 and serum bicarbonate to 34. ?Plan to continue close monitoring of renal function, will hold on diuretic therapy for now.  ? ?Essential hypertension ?Blood pressure control with metoprolol and amlodipine  ? ? ? ? ?  ? ?Subjective: patient with improvement in dyspnea, has not been out of bed yet,  ? ?Physical Exam: ?Vitals:  ? 01/19/22 1003 01/19/22 1200 01/19/22 1535 01/19/22 1545  ?BP: (!) 140/40   (!) 141/49  ?Pulse: 75 66  69  ?Resp: '20 18  20  '$ ?Temp: 98.5 ?F (36.9 ?C)   97.7 ?F (36.5 ?C)  ?TempSrc: Oral   Oral  ?SpO2: 96% 96% 97% 96%  ?Weight:      ?Height:      ? ?Neurology awake and alert ?ENT with no pallor ?Cardiovascular with S1 and S2 present and  rhythmic with no gallops or murmurs ?No JVD ?No lower extremity edema  ?Respiratory with decreased breath sounds and prolonged expiratory phase but not wheezing or rhonchi  ?Abdomen not distended ? ?Data Reviewed: ? ? ? ?Family Communication: no family at the bedside ? ?Disposition: ?Status is: Inpatient ?Remains inpatient appropriate because: respiratory failure  ? Planned Discharge Destination: Home ? ?Author: ?Tawni Millers, MD ?01/19/2022 5:44 PM ? ?For on  call review [email or web address].  ?

## 2022-01-19 NOTE — Progress Notes (Signed)
Inpatient Diabetes Program Recommendations ? ?AACE/ADA: New Consensus Statement on Inpatient Glycemic Control (2015) ? ?Target Ranges:  Prepandial:   less than 140 mg/dL ?     Peak postprandial:   less than 180 mg/dL (1-2 hours) ?     Critically ill patients:  140 - 180 mg/dL  ? ?Lab Results  ?Component Value Date  ? GLUCAP 280 (H) 01/19/2022  ? HGBA1C 7.1 (H) 01/15/2022  ? ? ?Review of Glycemic Control ? Latest Reference Range & Units 01/18/22 06:19 01/18/22 11:31 01/18/22 16:03 01/18/22 21:36 01/19/22 05:58 01/19/22 11:30  ?Glucose-Capillary 70 - 99 mg/dL 237 (H) 160 (H) 214 (H) 242 (H) 205 (H) 280 (H)  ?(H): Data is abnormally high ? ?Diabetes history: DM2 ?Outpatient Diabetes medications: Metformin 500 mg bid ?Current orders for Inpatient glycemic control: Semglee 16 units qd, Novolog correction 0-15 units tid, Solumedrol 120 mg bid ? ?Inpatient Diabetes Program Recommendations:   ?While patient on steroids, please consider: ?-Add Novolog 3 units tid meal coverage if eats 50% meal ?Secure chat sent to Dr. Cathlean Sauer. ? ?Thank you, ?Nani Gasser Yahia Bottger, RN, MSN, CDE  ?Diabetes Coordinator ?Inpatient Glycemic Control Team ?Team Pager [phone] (8am-5pm) ?01/19/2022 11:38 AM ?' ? ? ? ?

## 2022-01-19 NOTE — Plan of Care (Signed)
  Problem: Education: Goal: Knowledge of disease or condition will improve Outcome: Progressing Goal: Knowledge of the prescribed therapeutic regimen will improve Outcome: Progressing   Problem: Activity: Goal: Ability to tolerate increased activity will improve Outcome: Progressing   

## 2022-01-19 NOTE — Progress Notes (Signed)
Pt placed on BIPAP for night rest tolerating well. 

## 2022-01-19 NOTE — TOC Progression Note (Signed)
Transition of Care (TOC) - Progression Note  ? ? ?Patient Details  ?Name: Deanna Petty ?MRN: [id number] ?Date of Birth: Mar 14, 1946 ? ?Transition of Care (TOC) CM/SW Contact  ?Zenon Mayo, RN ?Phone Number: ?01/19/2022, 4:01 PM ? ?Clinical Narrative:    ?NCM spoke with Patient at the bedside,  she states she lives with her cousin [place] [address].  She states she will have to ask her if it will be ok for her to get  [facility] services there at her home.  NCM spoke with Doodle at [phone], sthe states yes he can have [place] to come to her home.  NCM offered choice to patient. And she states she does not have a preference.  She has a rolling walker and a quad cane at home.  Per Doodle her grandson , Merrily Pew will transport patient home at discharge. NCM made referral to [facility] with Alvis Lemmings ,he is able to take referral . Soc will begin 24 to 48 hrs post dc.  ?  ? ? ?Expected Discharge Plan: [place] ?Barriers to Discharge: Continued Medical Work up ? ?Expected Discharge Plan and Services ?Expected Discharge Plan: [place] ?  ?Discharge Planning Services: CM Consult ?Post Acute Care Choice: Home Health ?Living arrangements for the past 2 months: [place] ?                ?  ?DME Agency: NA ?  ?  ?  ?HH Arranged: PT, OT ?[place] Agency: [place] ?Date HH Agency Contacted: 01/17/22 ?Time [place]: 1600 ?Representative spoke with at [place]: Tommi Rumps ? ? ?Social Determinants of Health (SDOH) Interventions ?  ? ?Readmission Risk Interventions ?   ? View : No data to display.  ?  ?  ?  ? ? ?

## 2022-01-19 NOTE — Plan of Care (Signed)
?  Problem: Education: ?Goal: Knowledge of disease or condition will improve ?Outcome: Progressing ?  ?Problem: Education: ?Goal: Knowledge of the prescribed therapeutic regimen will improve ?Outcome: Progressing ?  ?Problem: Activity: ?Goal: Ability to tolerate increased activity will improve ?Outcome: Progressing ?  ?Problem: Activity: ?Goal: Will verbalize the importance of balancing activity with adequate rest periods ?Outcome: Progressing ?  ?Problem: Respiratory: ?Goal: Ability to maintain a clear airway will improve ?Outcome: Progressing ?  ?Problem: Respiratory: ?Goal: Levels of oxygenation will improve ?Outcome: Progressing ?  ?

## 2022-01-20 ENCOUNTER — Other Ambulatory Visit (HOSPITAL_COMMUNITY): Payer: Self-pay

## 2022-01-20 DIAGNOSIS — E1169 Type 2 diabetes mellitus with other specified complication: Secondary | ICD-10-CM

## 2022-01-20 LAB — CULTURE, BLOOD (ROUTINE X 2)
Culture: NO GROWTH
Culture: NO GROWTH
Special Requests: ADEQUATE

## 2022-01-20 LAB — BASIC METABOLIC PANEL
Anion gap: 3 — ABNORMAL LOW (ref 5–15)
BUN: 24 mg/dL — ABNORMAL HIGH (ref 8–23)
CO2: 39 mmol/L — ABNORMAL HIGH (ref 22–32)
Calcium: 8.9 mg/dL (ref 8.9–10.3)
Chloride: 96 mmol/L — ABNORMAL LOW (ref 98–111)
Creatinine, Ser: 0.93 mg/dL (ref 0.44–1.00)
GFR, Estimated: >60 mL/min (ref >60)
Glucose, Bld: 120 mg/dL — ABNORMAL HIGH (ref 70–99)
Potassium: 4.7 mmol/L (ref 3.5–5.1)
Sodium: 138 mmol/L (ref 135–145)

## 2022-01-20 LAB — GLUCOSE, CAPILLARY
Glucose-Capillary: 119 mg/dL — ABNORMAL HIGH (ref 70–99)
Glucose-Capillary: 140 mg/dL — ABNORMAL HIGH (ref 70–99)

## 2022-01-20 MED ORDER — BUDESONIDE 0.5 MG/2ML IN SUSP
0.5000 mg | Freq: Two times a day (BID) | RESPIRATORY_TRACT | 0 refills | Status: DC
  Filled 2022-01-20: qty 60, 15d supply, fill #0

## 2022-01-20 MED ORDER — PREDNISONE 20 MG PO TABS
20.0000 mg | ORAL_TABLET | Freq: Every day | ORAL | 0 refills | Status: AC
  Filled 2022-01-20: qty 2, 2d supply, fill #0

## 2022-01-20 MED ORDER — ALBUTEROL SULFATE HFA 108 (90 BASE) MCG/ACT IN AERS: 2.0000 | INHALATION_SPRAY | RESPIRATORY_TRACT | 1 refills | Status: DC | PRN

## 2022-01-20 MED ORDER — ALBUTEROL SULFATE (2.5 MG/3ML) 0.083% IN NEBU
2.5000 mg | INHALATION_SOLUTION | Freq: Four times a day (QID) | RESPIRATORY_TRACT | 0 refills | Status: DC | PRN
  Filled 2022-01-20: qty 180, 15d supply, fill #0

## 2022-01-20 MED ORDER — AMOXICILLIN-POT CLAVULANATE 875-125 MG PO TABS
1.0000 | ORAL_TABLET | Freq: Two times a day (BID) | ORAL | 0 refills | Status: AC
  Filled 2022-01-20: qty 4, 2d supply, fill #0

## 2022-01-20 MED ORDER — NICOTINE 14 MG/24HR TD PT24
14.0000 mg | MEDICATED_PATCH | Freq: Every day | TRANSDERMAL | 0 refills | Status: DC
  Filled 2022-01-20: qty 28, 28d supply, fill #0

## 2022-01-20 NOTE — Plan of Care (Signed)
?  Problem: Education: ?Goal: Knowledge of disease or condition will improve ?Outcome: Adequate for Discharge ?Goal: Knowledge of the prescribed therapeutic regimen will improve ?Outcome: Adequate for Discharge ?Goal: Individualized Educational Video(s) ?Outcome: Adequate for Discharge ?  ?Problem: Activity: ?Goal: Ability to tolerate increased activity will improve ?Outcome: Adequate for Discharge ?Goal: Will verbalize the importance of balancing activity with adequate rest periods ?Outcome: Adequate for Discharge ?  ?Problem: Respiratory: ?Goal: Ability to maintain a clear airway will improve ?Outcome: Adequate for Discharge ?Goal: Levels of oxygenation will improve ?Outcome: Adequate for Discharge ?Goal: Ability to maintain adequate ventilation will improve ?Outcome: Adequate for Discharge ?  ?Problem: Education: ?Goal: Knowledge of General Education information will improve ?Description: Including pain rating scale, medication(s)/side effects and non-pharmacologic comfort measures ?Outcome: Adequate for Discharge ?  ?Problem: Health Behavior/Discharge Planning: ?Goal: Ability to manage health-related needs will improve ?Outcome: Adequate for Discharge ?  ?Problem: Clinical Measurements: ?Goal: Ability to maintain clinical measurements within normal limits will improve ?Outcome: Adequate for Discharge ?Goal: Will remain free from infection ?Outcome: Adequate for Discharge ?Goal: Diagnostic test results will improve ?Outcome: Adequate for Discharge ?Goal: Respiratory complications will improve ?Outcome: Adequate for Discharge ?Goal: Cardiovascular complication will be avoided ?Outcome: Adequate for Discharge ?  ?Problem: Activity: ?Goal: Risk for activity intolerance will decrease ?Outcome: Adequate for Discharge ?  ?Problem: Coping: ?Goal: Level of anxiety will decrease ?Outcome: Adequate for Discharge ?  ?Problem: Elimination: ?Goal: Will not experience complications related to bowel motility ?Outcome: Adequate  for Discharge ?Goal: Will not experience complications related to urinary retention ?Outcome: Adequate for Discharge ?  ?Problem: Pain Managment: ?Goal: General experience of comfort will improve ?Outcome: Adequate for Discharge ?  ?Problem: Safety: ?Goal: Ability to remain free from injury will improve ?Outcome: Adequate for Discharge ?  ?Problem: Skin Integrity: ?Goal: Risk for impaired skin integrity will decrease ?Outcome: Adequate for Discharge ?  ?Problem: Education: ?Goal: Ability to demonstrate management of disease process will improve ?Outcome: Adequate for Discharge ?Goal: Ability to verbalize understanding of medication therapies will improve ?Outcome: Adequate for Discharge ?Goal: Individualized Educational Video(s) ?Outcome: Adequate for Discharge ?  ?Problem: Activity: ?Goal: Capacity to carry out activities will improve ?Outcome: Adequate for Discharge ?  ?Problem: Cardiac: ?Goal: Ability to achieve and maintain adequate cardiopulmonary perfusion will improve ?Outcome: Adequate for Discharge ?  ?

## 2022-01-20 NOTE — TOC Transition Note (Signed)
Transition of Care (TOC) - CM/SW Discharge Note ? ? ?Patient Details  ?Name: Deanna Petty ?MRN: [id number] ?Date of Birth: [birth year] ? ?Transition of Care (TOC) CM/SW Contact:  ?Zenon Mayo, RN ?Phone Number: ?01/20/2022, 11:45 AM ? ? ?Clinical Narrative:    ?Patient is for dc today, she is set up with [facility] for [place], [place]. NCM notified [place] with [place].  NCM made referral to [facility] with [employer] for the home oxygen.  NCM notified MD for oxygen order.   ? ? ?Final next level of care: [place] ?Barriers to Discharge:  (waiting for oxygen to be ordered and delivered) ? ? ?Patient Goals and CMS Choice ?Patient states their goals for this hospitalization and ongoing recovery are:: return home ?CMS Medicare.gov Compare Post Acute Care list provided to:: Patient ?Choice offered to / list presented to : Patient ? ?Discharge Placement ?  ?           ?  ?  ?  ?  ? ?Discharge Plan and Services ?  ?Discharge Planning Services: CM Consult ?Post Acute Care Choice: Home Health          ?DME Arranged: Oxygen ?DME Agency: [employer] ?Date DME Agency Contacted: 01/20/22 ?Time DME Agency Contacted: [phone] ?Representative spoke with at DME Agency: Adela Lank ?HH Arranged: PT, OT ?[place] Agency: [place] ?Date HH Agency Contacted: 01/17/22 ?Time [place]: 1600 ?Representative spoke with at [place]: Tommi Rumps ? ?Social Determinants of Health (SDOH) Interventions ?  ? ? ?Readmission Risk Interventions ?   ? View : No data to display.  ?  ?  ?  ? ? ? ? ? ?

## 2022-01-20 NOTE — Progress Notes (Signed)
Mobility Specialist Progress Note: ? ? 01/20/22 1130  ?Mobility  ?Activity Ambulated with assistance in hallway  ?Level of Assistance Standby assist, set-up cues, supervision of patient - no hands on  ?Assistive Device None  ?Distance Ambulated (ft) 200 ft  ?Activity Response Tolerated well  ?$Mobility charge 1 Mobility  ? ?Pt eager for mobility session. Ambulated with no AD, minG for safety. Required 3LO2 upon exertion. Back sitting EOB with all needs met.  ? ?[place] Numbers ?Acute Rehab ?Phone: [phone] ?Office Phone: [phone] ? ?

## 2022-01-20 NOTE — Discharge Summary (Signed)
?Physician Discharge Summary ?  ?Patient: Deanna Petty MRN: [id number] DOB: Nov 30, 1945  ?Admit date:     01/15/2022  ?Discharge date: 01/20/22  ?Discharge Physician: Jimmy Picket Johncharles Fusselman  ? ?PCP: Arthur Holms, NP  ? ?Recommendations at discharge:  ? ? Patient will continue 2 more days of Augmentin ?Continue 2 more days of oral prednisone.  ?Continue with aggressive bronchodilatory therapy and added inhaled corticosteroid with budesonide.  ?Follow up as outpatient.  ? ?Discharge Diagnoses: ?Active Problems: ?  COPD exacerbation ([place]) ?  CAP (community acquired pneumonia) ?  Acute on chronic diastolic CHF (congestive heart failure) ([place]) ?  Tobacco use ?  Type 2 diabetes mellitus with hyperlipidemia ([place]) ?  Essential hypertension ?  Hypomagnesemia ? ?Resolved Problems: ?  * No resolved hospital problems. * ? ?Hospital Course: ?[name] was admitted to the hospital with the working diagnosis of decompensated COPD exacerbation complicated with heart failure exacerbation.  ? ?Patient 76 year old female history of COPD with ongoing tobacco use, chronic diastolic CHF, DM 2 presented to the ED with severe shortness of breath, cough, wheezing.  Patient noted to have sats of 63% on room air placed on a nonrebreather.  Patient received albuterol and Solu-Medrol by EMS and brought to the ED.  On her initial physical examination her blood pressure was 1125/104, HR 97, RR 23, 02 saturation 92% on supplemental 02, heart with S1 and S2 present and rhythmic, lungs with wheezing and rales, abdomen not distended and no lower extremity edema.  ? ?ABG 7.23/ 77.8/ 59/ 36/ 84%  ?Na 137, K 4,4 CL 98, bicarbonate 33, glucose 149, bun 8 cr 0,70 ?BNP 380 ?Wbc 11.9, hgb 15,3 hct 50, plt 171 ?Sars covid 19 negative  ? ?Chest radiograph with bilateral interstitial infiltrates, predominantly at the right lower lobe (more dense).  ? ?EKG 92 bpm, right axis, normal qtx, sinus rhythm with poor R wave progression, no ST segment changes, negative T  wave lead II, III, Avf, V4 to V6.  ? ?Patient was placed on non invasive mechanical ventilation, received systemic steroid and antibiotic therapy.  ?Added diuresis for volume overload with good response. ? ?Plan to continue bronchodilator therapy at home, smoking cessation and will have ambulatory oxymetry on room air before her discharge.  ? ?Assessment and Plan: ?COPD exacerbation ([place]) ?Acute hypoxemic/ hypercapnic respiratory failure.  ? ?Patient was placed on systemic/ inhaled corticosteroids and aggressive bronchodilator therapy.  ?Antibiotic therapy for right lower lobe community acquired pneumonia.  ?Her oxygenation and work of breathing improved. ?She was liberated from non invasive mechanical ventilation and transitioned to nasal cannula with good toleration.  ? ?At the time of her discharge her oxymetry is 100% on 3 L/min per [place] ?Will continue antibiotic therapy with Augmentin for 2 more days. ?Continue bronchodilator therapy at home.  ?Smoking cessation, and continue nicotine patch at home.  ? ?CAP (community acquired pneumonia) ?Continue antibiotic therapy with Augmentin Continue oxymetry monitoring ?No sepsis (on admission)  ? ?Acute on chronic diastolic CHF (congestive heart failure) ([place]) ?Acute diastolic heart failure.  ?Echocardiogram with preserved LV systolic function with EF >75%, mild LVH, preserved RV systolic function. Mild to moderate aortic stenosis.  ? ?Patient was diuresed with furosemide with good toleration.  ?Acute on chronic core pulmonale. ?At discharge will continue blood pressure monitoring.  ?Hold on further diuresis.  ? ?Type 2 diabetes mellitus with hyperlipidemia ([place]) ?Hyper and hypoglycemia,  ?Steroid induced hyperglycemia.  ? ?Patient was placed on insulin therapy with good toleration. ?At the time  of her discharge her fasting glucose is 120 mg/dl.  ?Will continue 2 more days with oral prednisone.  ?Resume metformin at discharge. ? ?Continue with atorvastatin for  dyslipidemia.  ? ?Tobacco use ?- Tobacco cessation stressed to patient ?-Continue nicotine patch. ? ?Hypomagnesemia ?Hyperkalemia. ? ?Her electrolytes were corrected and her renal function remained stable. ?A the time of her discharge her renal function has a serum cr of 0,93, K is 4,7 and serum bicarbonate at 39.  ? ?Essential hypertension ?Blood pressure control with metoprolol and amlodipine  ? ? ? ? ?  ? ? ?Consultants: none  ?Procedures performed: none   ?Disposition: Home ?Diet recommendation:  ?Cardiac diet ?DISCHARGE MEDICATION: ?Allergies as of 01/20/2022   ?No Known Allergies ?  ? ?  ?Medication List  ?  ? ?STOP taking these medications   ? ?cholecalciferol 25 MCG (1000 UNIT) tablet ?Commonly known as: VITAMIN D3 ?  ?ergocalciferol 1.25 MG (50000 UT) capsule ?Commonly known as: VITAMIN D2 ?  ?ibuprofen 200 MG tablet ?Commonly known as: ADVIL ?  ?losartan 50 MG tablet ?Commonly known as: COZAAR ?  ?Vicks VapoRub 4.7-1.2-2.6 % Oint ?  ? ?  ? ?TAKE these medications   ? ?Acetaminophen 500 MG capsule ?Take 500 mg by mouth every 6 (six) hours as needed for fever or pain. ?  ?albuterol (2.5 MG/3ML) 0.083% nebulizer solution ?Commonly known as: PROVENTIL ?Take 3 mLs (2.5 mg total) by nebulization every 6 (six) hours as needed for shortness of breath or wheezing. ?What changed: You were already taking a medication with the same name, and this prescription was added. Make sure you understand how and when to take each. ?  ?albuterol 108 (90 Base) MCG/ACT inhaler ?Commonly known as: VENTOLIN HFA ?Inhale 2 puffs into the lungs every 4 (four) hours as needed for wheezing or shortness of breath. ?What changed: when to take this ?  ?amLODipine 10 MG tablet ?Commonly known as: NORVASC ?Take 10 mg by mouth daily. ?  ?amoxicillin-clavulanate 875-125 MG tablet ?Commonly known as: AUGMENTIN ?Take 1 tablet by mouth every 12 (twelve) hours for 2 days. ?  ?atorvastatin 10 MG tablet ?Commonly known as: LIPITOR ?Take 10 mg by mouth  at bedtime. ?  ?budesonide 0.5 MG/2ML nebulizer solution ?Commonly known as: PULMICORT ?Take 2 mLs (0.5 mg total) by nebulization 2 (two) times daily. ?  ?metFORMIN 500 MG tablet ?Commonly known as: Glucophage ?Take 1 tablet (500 mg total) by mouth 2 (two) times daily with a meal. ?  ?metoprolol succinate 25 MG 24 hr tablet ?Commonly known as: TOPROL-XL ?Take 25 mg by mouth daily. ?  ?nicotine 14 mg/24hr patch ?Commonly known as: NICODERM CQ - dosed in mg/24 hours ?Place 1 patch (14 mg total) onto the skin daily. ?Start taking on: January 21, 2022 ?  ?pantoprazole 40 MG tablet ?Commonly known as: PROTONIX ?Take 40 mg by mouth daily. ?  ?predniSONE 20 MG tablet ?Commonly known as: DELTASONE ?Take 1 tablet (20 mg total) by mouth daily with breakfast for 2 days. ?Start taking on: January 21, 2022 ?  ?umeclidinium-vilanterol 62.5-25 MCG/INH Aepb ?Commonly known as: ANORO ELLIPTA ?Inhale 1 puff into the lungs daily. ?  ? ?  ? ?  ?  ? ? ?  ?Durable Medical Equipment  ?(From admission, onward)  ?  ? ? ?  ? ?  Start     Ordered  ? 01/20/22 0000  For home use only DME Nebulizer machine       ?Question Answer Comment  ?  Patient needs a nebulizer to treat with the following condition COPD exacerbation ([place])   ?Length of Need 6 Months   ?  ? 01/20/22 1112  ? ?  ?  ? ?  ? ? Follow-up Information   ? ? [facility], [facility] Follow up.   ?Specialty: Home Health Services ?Why: HHPT,HHOT- Agency will call you to set up apt times. ?Contact information: ?[address] ?[address] ?[place] [address] ?[phone] ? ? ?  ?  ? ? Arthur Holms, NP Follow up on 01/20/2022.   ?Specialty: Nurse Practitioner ?Contact information: ?[address] ?[place] [address] ?[phone] ? ? ?  ?  ? ?  ?  ? ?  ? ?Discharge Exam: ?Filed Weights  ? 01/18/22 0014 01/19/22 0433 01/20/22 0136  ?Weight: 89.7 kg 87 kg 85.8 kg  ? ?BP (!) 157/52 (BP Location: Right Arm)   Pulse 68   Temp 97.9 ?F (36.6 ?C) (Oral)   Resp 18   Ht '5\' 2"'$  (1.575 m)   Wt 85.8 kg    SpO2 100%   BMI 34.60 kg/m?  ? ?Patient is feeling better, dyspnea and edema have improved ? ?Neurology awake and alert ?ENT with no pallor ?Cardiovascular with S1 and S2 present and rhythmic with no gallops, rubs or

## 2022-01-20 NOTE — TOC Progression Note (Signed)
Transition of Care (TOC) - Progression Note  ? ? ?Patient Details  ?Name: Deanna Petty ?MRN: [id number] ?Date of Birth: 20-Aug-1946 ? ?Transition of Care (TOC) CM/SW Contact  ?Zenon Mayo, RN ?Phone Number: ?01/20/2022, 11:27 AM ? ?Clinical Narrative:    ?Patient need to be checked for walking oxygen sats.  ? ? ?Expected Discharge Plan: [place] ?Barriers to Discharge: Continued Medical Work up ? ?Expected Discharge Plan and Services ?Expected Discharge Plan: [place] ?  ?Discharge Planning Services: CM Consult ?Post Acute Care Choice: Home Health ?Living arrangements for the past 2 months: [place] ?Expected Discharge Date: 01/20/22               ?  ?DME Agency: NA ?  ?  ?  ?HH Arranged: PT, OT ?[place] Agency: [place] ?Date HH Agency Contacted: 01/17/22 ?Time [place]: 1600 ?Representative spoke with at [place]: Tommi Rumps ? ? ?Social Determinants of Health (SDOH) Interventions ?  ? ?Readmission Risk Interventions ?   ? View : No data to display.  ?  ?  ?  ? ? ?

## 2022-01-20 NOTE — Progress Notes (Signed)
Patient slept with 3L O2 via nasal cannula instead of BiPAP. Patient continues to desat without supplemental O2. ?

## 2022-01-20 NOTE — Progress Notes (Signed)
SATURATION QUALIFICATIONS: (This note is used to comply with regulatory documentation for home oxygen) ? ?Patient Saturations on Room Air at Rest = 97% ? ?Patient Saturations on Room Air while Ambulating = 80% ? ?Patient Saturations on 3 Liters of oxygen while Ambulating = 93% ? ?Please briefly explain why patient needs home oxygen: ? ?Deanna Petty ?Acute Rehab ?Phone: [phone] ?Office Phone: [phone] ? ?

## 2022-01-20 NOTE — Care Management Important Message (Signed)
Important Message ? ?Patient Details  ?Name: Deanna Petty ?MRN: [id number] ?Date of Birth: 10/11/1946 ? ? ?Medicare Important Message Given:  Yes ? ? ? ? ?[name] ?01/20/2022, 8:52 AM ?

## 2022-01-20 NOTE — Progress Notes (Signed)
Physical Therapy Treatment ?Patient Details ?Name: Deanna Petty ?MRN: [id number] ?DOB: [birth year] ?Today's Date: 01/20/2022 ? ? ?History of Present Illness Pt is a 76 y.o. female who presented 01/15/22 with SOB and pt found to have SpO2 level of 63% on RA. Pt admitted with COPD exacerbation and CAP of RLL. PMH: COPD, DM2, chronic diastolic CHF ? ?  ?PT Comments  ? ? The pt was agreeable to session with focus on determining O2 needs with gait and exertion of stairs. The pt was able to ambulate ~150 ft with single UE support on rails intermittently or no UE support at times with SpO2 >92% on 2L. She did need boost to 3L to manage stairs with SpO2 > 92%, then maintained SpO2 > 94% with gait. The pt did need x2 standing rest breaks due to fatigue, but was able to self-monitor exertion appropriately. Will continue to benefit from skilled PT acutely and after d/c to maximize functional endurance and stability, but is safe to return home with 3L O2 as needed. Pt educated on use of pulse-ox for home monitoring, no further questions at this time.  ? ?SpO2 on RA at rest: 95% ?SpO2 on 2L with gait: 92% ?SpO2 on 3L with stairs: 92% ?   ?Recommendations for follow up therapy are one component of a multi-disciplinary discharge planning process, led by the attending physician.  Recommendations may be updated based on patient status, additional functional criteria and insurance authorization. ? ?Follow Up Recommendations ? Home health PT ?  ?  ?Assistance Recommended at Discharge Intermittent Supervision/Assistance  ?Patient can return home with the following A little help with walking and/or transfers;A little help with bathing/dressing/bathroom;Assistance with cooking/housework;Help with stairs or ramp for entrance ?  ?Equipment Recommendations ? None recommended by PT  ?  ?Recommendations for Other Services   ? ? ?  ?Precautions / Restrictions Precautions ?Precautions: Fall ?Precaution Comments: watch SpO2, stable on RA at rest, 3L  with activity ?Restrictions ?Weight Bearing Restrictions: No  ?  ? ?Mobility ? Bed Mobility ?Overal bed mobility: Modified Independent ?  ?  ?  ?  ?  ?  ?General bed mobility comments: pt sitting EOB upon arrival ?  ? ?Transfers ?Overall transfer level: Modified independent ?Equipment used: None ?Transfers: Sit to/from Stand ?Sit to Stand: Modified independent (Device/Increase time) ?  ?  ?  ?  ?  ?General transfer comment: supervision during session, no DME or assist ?  ? ?Ambulation/Gait ?Ambulation/Gait assistance: Min guard, Min assist ?Gait Distance (Feet): 150 Feet ?Assistive device: None ?Gait Pattern/deviations: Step-through pattern, Decreased stride length, Trunk flexed ?Gait velocity: decreased ?Gait velocity interpretation: <1.31 ft/sec, indicative of household ambulator ?  ?General Gait Details: pt with intermittent use of rail with fatigue, able to ambulate without UE support when no rail available. slow but steady, progressed to minA with fatigue. SpO2 >94% on 2L but needed 3L to maintain on stairs ? ? ?Stairs ?Stairs: Yes ?Stairs assistance: Min guard ?Stair Management: One rail Left, Step to pattern, Forwards ?Number of Stairs: 2 ?General stair comments: 3L O2 to maintain in 90s, slow with significant effort but able to complete wihtout assist ? ? ? ? ?  ?Balance Overall balance assessment: Needs assistance ?Sitting-balance support: Feet supported ?Sitting balance-Leahy Scale: Normal ?  ?  ?Standing balance support: Single extremity supported, During functional activity, No upper extremity supported ?Standing balance-Leahy Scale: Fair ?Standing balance comment: minG with single to no UE support, unable to tolerate challenge ?  ?  ?  ?  ?  ?  ?  ?  ?  ?  ?  ?  ? ?  ?  Cognition Arousal/Alertness: Awake/alert ?Behavior During Therapy: [facility] for tasks assessed/performed ?Overall Cognitive Status: Within Functional Limits for tasks assessed ?Area of Impairment: Following commands, Problem solving ?  ?  ?  ?   ?  ?  ?  ?  ?  ?  ?  ?Following Commands: Follows one step commands consistently ?  ?  ?Problem Solving: Slow processing ?General Comments: slightly slowed processing and responses to all questions, all answers appropriate and able to monitor exertion independently well ?  ?  ? ?  ?Exercises   ? ?  ?General Comments General comments (skin integrity, edema, etc.): SpO2 stable on RA at rest, 95%, but needs 2-3L with exertion for gait. SpO2 94% on 2L with gait, 92% on 3L with stairs ?  ?  ? ?Pertinent Vitals/Pain Pain Assessment ?Pain Assessment: No/denies pain  ? ? ? ?PT Goals (current goals can now be found in the care plan section) Acute Rehab PT Goals ?Patient Stated Goal: return home ?PT Goal Formulation: With patient ?Time For Goal Achievement: 01/31/22 ?Potential to Achieve Goals: Good ?Progress towards PT goals: Progressing toward goals ? ?  ?Frequency ? ? ? Min 3X/week ? ? ? ?  ?PT Plan Current plan remains appropriate  ? ? ?   ?AM-PAC PT "6 Clicks" Mobility   ?Outcome Measure ? Help needed turning from your back to your side while in a flat bed without using bedrails?: A Little ?Help needed moving from lying on your back to sitting on the side of a flat bed without using bedrails?: A Little ?Help needed moving to and from a bed to a chair (including a wheelchair)?: A Little ?Help needed standing up from a chair using your arms (e.g., wheelchair or bedside chair)?: A Little ?Help needed to walk in hospital room?: A Little ?Help needed climbing 3-5 steps with a railing? : A Lot ?6 Click Score: 17 ? ?  ?End of Session Equipment Utilized During Treatment: Gait belt;Oxygen ?Activity Tolerance: Patient tolerated treatment well ?Patient left: with call bell/phone within reach;in bed (sitting EOB) ?Nurse Communication: Mobility status ?PT Visit Diagnosis: Muscle weakness (generalized) (M62.81) ?  ? ? ?Time: 6468-0321 ?PT Time Calculation (min) (ACUTE ONLY): 15 min ? ?Charges:  $Therapeutic Exercise: 8-22 mins           ?          ? ?West Carbo, PT, DPT  ? ?Acute Rehabilitation Department ?Pager #: [phone] - [phone] ? ? ?Deanna Petty ?01/20/2022, 12:00 PM ? ?

## 2022-01-20 NOTE — Progress Notes (Deleted)
Patient slept with 3L O2 via nasal cannula instead of CPAP. Patient continues to desat without supplemental O2. ?

## 2022-01-20 NOTE — Progress Notes (Signed)
Explained discharge instructions to patient. Reviewed follow up appointments, next medication administration times, and ensured the patient had all belongings to include TOC Meds are in patient's possession. All belongings listed on admission are in patient care at the time of discharge. IV was removed by RN prior to discharging. Oxygen equipment is in the patient's room. She is awaiting son's arrival to transport home.  ?

## 2022-01-20 NOTE — Progress Notes (Signed)
Initial Nutrition Assessment ? ?DOCUMENTATION CODES:  ? ?Obesity unspecified ? ?INTERVENTION:  ? ?- MVI with minerals daily ? ?- Encourage ongoing adequate PO intake ? ?NUTRITION DIAGNOSIS:  ? ?Increased nutrient needs related to chronic illness (CHF, COPD) as evidenced by estimated needs. ? ?GOAL:  ? ?Patient will meet greater than or equal to 90% of their needs ? ?MONITOR:  ? ?PO intake, Labs, Weight trends, I & O's ? ?REASON FOR ASSESSMENT:  ? ?Consult ?Assessment of nutrition requirement/status ? ?ASSESSMENT:  ? ?76 year old female with PMH of COPD with ongoing tobacco use, CHF, T2DM. Pt presented to the ED with severe SOB, cough, wheezing. Chest x-ray done concerning for RLL pneumonia. Pt admitted placed on the BiPAP and being treated for acute COPD exacerbation and CAP. ? ?Spoke with pt at bedside. Pt reports an excellent appetite and eating well. Noted meal completions of 100% for last 7 documented meals. Pt reports eating well and having a good appetite PTA. She reports typically eating 3 meals daily. She denies any recent weight changes and reports a UBW of 181 lbs. ? ?Pt reports that she does take a MVI with minerals daily at home. RD will order this during admission. Encouraged ongoing adequate PO intake at meals. ? ?Admit weight: 88.1 kg ?Current weight: 85.8 kg ? ?Meal Completion: 100% x last 7 documented meals ? ?Medications reviewed and include: SSI, semglee 16 units daily, protonix, prednisone ? ?Labs reviewed: BUN 24, hemoglobin A1C 7.1 ?CBG's: 119-280 x 24 hours ? ?UOP: 4450 ml x 24 hours ?I/O's: -7.8 L since admit ? ?NUTRITION - FOCUSED PHYSICAL EXAM: ? ?Flowsheet Row Most Recent Value  ?Orbital Region No depletion  ?Upper Arm Region Mild depletion  ?Thoracic and Lumbar Region No depletion  ?Buccal Region No depletion  ?Temple Region No depletion  ?Clavicle Bone Region No depletion  ?Clavicle and Acromion Bone Region No depletion  ?Scapular Bone Region No depletion  ?Dorsal Hand No depletion   ?Patellar Region Mild depletion  ?Anterior Thigh Region Mild depletion  ?Posterior Calf Region Mild depletion  ?Edema (RD Assessment) Mild  [BLE]  ?Hair Reviewed  ?Eyes Reviewed  ?Mouth Reviewed  ?Skin Reviewed  ?Nails Reviewed  ? ?  ? ? ?Diet Order:   ?Diet Order   ? ?       ?  Diet regular Room service appropriate? Yes; Fluid consistency: Thin  Diet effective now       ?  ? ?  ?  ? ?  ? ? ?EDUCATION NEEDS:  ? ?Education needs have been addressed ? ?Skin:  Skin Assessment: Reviewed RN Assessment ? ?Last BM:  01/19/22 ? ?Height:  ? ?Ht Readings from Last 1 Encounters:  ?01/15/22 '5\' 2"'$  (1.575 m)  ? ? ?Weight:  ? ?Wt Readings from Last 1 Encounters:  ?01/20/22 85.8 kg  ? ? ?BMI:  Body mass index is 34.6 kg/m?. ? ?Estimated Nutritional Needs:  ? ?Kcal:  1700-1900 ? ?Protein:  85-100 grams ? ?Fluid:  1.7-1.9 L ? ? ? ?Gustavus Bryant, MS, RD, LDN ?Inpatient Clinical Dietitian ?Please see AMiON for contact information. ? ?

## 2022-02-03 ENCOUNTER — Other Ambulatory Visit: Payer: Self-pay

## 2022-02-03 DIAGNOSIS — I739 Peripheral vascular disease, unspecified: Secondary | ICD-10-CM

## 2022-02-11 ENCOUNTER — Encounter: Payer: Medicare (Managed Care) | Admitting: Vascular Surgery

## 2022-02-11 ENCOUNTER — Ambulatory Visit (HOSPITAL_COMMUNITY)
Admission: RE | Admit: 2022-02-11 | Discharge: 2022-02-11 | Disposition: A | Discharge status: Home / Self Care | Payer: Medicare (Managed Care) | Source: Ambulatory Visit | Attending: Vascular Surgery | Admitting: Vascular Surgery

## 2022-02-11 DIAGNOSIS — I739 Peripheral vascular disease, unspecified: Secondary | ICD-10-CM

## 2022-02-16 ENCOUNTER — Ambulatory Visit (HOSPITAL_COMMUNITY)
Admission: RE | Admit: 2022-02-16 | Discharge: 2022-02-16 | Disposition: A | Discharge status: Home / Self Care | Payer: Medicare (Managed Care) | Source: Ambulatory Visit | Attending: Vascular Surgery | Admitting: Vascular Surgery

## 2022-02-16 DIAGNOSIS — I739 Peripheral vascular disease, unspecified: Secondary | ICD-10-CM | POA: Insufficient documentation

## 2022-02-16 NOTE — Progress Notes (Signed)
[facility] [place] ? ?ASSESSMENT / PLAN: ?Deanna Petty is a 76 y.o. female with atherosclerosis of native arteries of bilateral lower extremities causing atypical symptoms / claudication. ? ?Patient counseled patients with asymptomatic peripheral arterial disease or claudication have a 1-2% risk of developing chronic limb threatening ischemia, but a 15-30% risk of mortality in the next 5 years. Intervention should only be considered for medically optimized patients with disabling symptoms.  ? ?Recommend the following which can slow the progression of atherosclerosis and reduce the risk of major adverse cardiac / limb events:  ?Complete cessation from all tobacco products. ?Blood glucose control with goal A1c < 7%. ?Blood pressure control with goal blood pressure < 140/90 mmHg. ?Lipid reduction therapy with goal LDL-C <100 mg/dL (<70 if symptomatic from PAD).  ?Aspirin '81mg'$  PO QD.  ?Daily walking to and past the point of discomfort.  ?Adequate hydration (at least 2 liters / day) if patient's heart and kidney function is adequate. ? ?Patient is doing a good job trying to quit smoking.  I will see her again in 1 year.  We will repeat ABI at that time.  Patient was counseled of limb threatening symptoms.  She was encouraged to return to care should these develop. ? ?CHIEF COMPLAINT: Peripheral arterial disease identified by podiatrist ? ?HISTORY OF PRESENT ILLNESS: ?Deanna Petty is a 76 y.o. female who presents to clinic for evaluation of peripheral arterial disease.  The patient has been under the care of a podiatrist, who is concerned that she had significant peripheral arterial disease.  A arterial duplex was performed confirming significant disease in bilateral lower extremities.  She was referred for further care. ? ?Patient was recently discharged from the hospital for COPD exacerbation and pneumonia.  She is thankfully recovering well from that.  She has stopped smoking, thankfully.   The patient reports weakness in her legs after some walking.  I suspect she does not walk fast enough to claudicate.  She does tire easily, needs to take frequent rests when walking through a big box store like [employer].  She does not describe symptoms typical of ischemic rest pain.  She has no ulcers about her feet ? ?Past Medical History:  ?Diagnosis Date  ? Bronchitis   ? Cigarette smoker   ? COPD (chronic obstructive pulmonary disease) ([place])   ? Pneumonia   ? Shortness of breath   ? with exertion; lasted used inhaler 06/02/13  ? ? ?Past Surgical History:  ?Procedure Laterality Date  ? ABDOMINAL HYSTERECTOMY    ? BREAST LUMPECTOMY WITH NEEDLE LOCALIZATION Left 06/12/2013  ? Procedure: LEFT BREAST LUMPECTOMY WITH NEEDLE LOCALIZATION;  Surgeon: Imogene Burn. Georgette Dover, MD;  Location: [facility];  Service: General;  Laterality: Left;  ? BREAST SURGERY    ? HIP FRACTURE SURGERY Right   ? ? ?Family History  ?Problem Relation Age of Onset  ? Cancer Mother   ?     oral, brain  ? Diabetes Father   ? ? ?Social History  ? ?Socioeconomic History  ? Marital status: Single  ?  Spouse name: Not on file  ? Number of children: Not on file  ? Years of education: Not on file  ? Highest education level: Not on file  ?Occupational History  ? Not on file  ?Tobacco Use  ? Smoking status: Every Day  ?  Packs/day: 0.25  ?  Years: 40.00  ?  Pack years: 10.00  ?  Types: Cigarettes  ? Smokeless tobacco:  Never  ?Vaping Use  ? Vaping Use: Never used  ?Substance and Sexual Activity  ? Alcohol use: Yes  ?  Comment: rare  ? Drug use: No  ? Sexual activity: Not on file  ?Other Topics Concern  ? Not on file  ?Social History Narrative  ? Not on file  ? ?Social Determinants of Health  ? ?Financial Resource Strain: Not on file  ?Food Insecurity: Not on file  ?Transportation Needs: Not on file  ?Physical Activity: Not on file  ?Stress: Not on file  ?Social Connections: Not on file  ?Intimate Partner Violence: Not on file  ? ? ?No Known Allergies ? ?Current  Outpatient Medications  ?Medication Sig Dispense Refill  ? Acetaminophen 500 MG capsule Take 500 mg by mouth every 6 (six) hours as needed for fever or pain.    ? albuterol (PROVENTIL) (2.5 MG/3ML) 0.083% nebulizer solution Use 1 vial (2.5 mg total) by nebulization every 6 (six) hours as needed for shortness of breath or wheezing. 180 mL 0  ? albuterol (VENTOLIN HFA) 108 (90 Base) MCG/ACT inhaler Inhale 2 puffs into the lungs every 4 (four) hours as needed for wheezing or shortness of breath. 8 g 1  ? amLODipine (NORVASC) 10 MG tablet Take 10 mg by mouth daily.    ? atorvastatin (LIPITOR) 10 MG tablet Take 10 mg by mouth at bedtime.    ? budesonide (PULMICORT) 0.5 MG/2ML nebulizer solution Take 2 mLs (0.5 mg total) by nebulization 2 (two) times daily. 120 mL 0  ? losartan (COZAAR) 100 MG tablet Take 100 mg by mouth daily.    ? metoprolol succinate (TOPROL-XL) 25 MG 24 hr tablet Take 25 mg by mouth daily.    ? nicotine (NICODERM CQ - DOSED IN MG/24 HOURS) 14 mg/24hr patch Place 1 patch (14 mg total) onto the skin daily. 28 patch 0  ? pantoprazole (PROTONIX) 40 MG tablet Take 40 mg by mouth daily.    ? umeclidinium-vilanterol (ANORO ELLIPTA) 62.5-25 MCG/INH AEPB Inhale 1 puff into the lungs daily. 60 each 3  ? metFORMIN (GLUCOPHAGE) 500 MG tablet Take 1 tablet (500 mg total) by mouth 2 (two) times daily with a meal. 60 tablet 11  ? ?No current facility-administered medications for this visit.  ? ? ?PHYSICAL EXAM ?Vitals:  ? 02/17/22 1512  ?BP: (!) 126/57  ?Pulse: 83  ?Resp: 14  ?Temp: (!) 97.5 ?F (36.4 ?C)  ?TempSrc: Temporal  ?SpO2: 92%  ?Weight: 194 lb (88 kg)  ?Height: '5\' 2"'$  (1.575 m)  ? ? ?Constitutional: Chronically ill-appearing woman in no acute distress. ?Cardiac: regular rate and rhythm.  ?Respiratory:  unlabored. ?Abdominal:  soft, non-tender, non-distended.  ?Peripheral vascular: No palpable pedal pulses.  No ulcers about the foot, interdigital spaces, or heel. ? ?PERTINENT LABORATORY AND RADIOLOGIC  DATA ? ?Most recent CBC ? ?  Latest Ref Rng & Units 01/19/2022  ?  3:12 AM 01/18/2022  ?  4:21 AM 01/17/2022  ?  2:47 AM  ?CBC  ?WBC 4.0 - 10.5 K/uL 10.1   10.8   13.3    ?Hemoglobin 12.0 - 15.0 g/dL 13.8   13.5   13.2    ?Hematocrit 36.0 - 46.0 % 42.1   42.2   42.5    ?Platelets 150 - 400 K/uL 184   182   187    ?  ? ?Most recent CMP ? ?  Latest Ref Rng & Units 01/20/2022  ?  4:49 AM 01/19/2022  ?  3:12 AM  01/18/2022  ?  4:21 AM  ?CMP  ?Glucose 70 - 99 mg/dL 120   192   214    ?BUN 8 - 23 mg/dL 24   33   28    ?Creatinine 0.44 - 1.00 mg/dL 0.93   0.92   0.92    ?Sodium 135 - 145 mmol/L 138   136   138    ?Potassium 3.5 - 5.1 mmol/L 4.7   4.8   4.9    ?Chloride 98 - 111 mmol/L 96   94   98    ?CO2 22 - 32 mmol/L 39   34   34    ?Calcium 8.9 - 10.3 mg/dL 8.9   8.9   9.0    ? ? ?Renal function ?CrCl cannot be calculated (Patient's most recent lab result is older than the maximum 21 days allowed.). ? ?Hgb A1c MFr Bld (%)  ?Date Value  ?01/15/2022 7.1 (H)  ? ? ?LDL Cholesterol  ?Date Value Ref Range Status  ?09/20/2021 55 0 - 99 mg/dL Final  ?  Comment:  ?         ?Total Cholesterol/HDL:CHD Risk ?Coronary Heart Disease Risk Table ?                    Men   Women ? 1/2 Average Risk   3.4   3.3 ? Average Risk       5.0   4.4 ? 2 X Average Risk   9.6   7.1 ? 3 X Average Risk  23.4   11.0 ?       ?Use the calculated Patient Ratio ?above and the CHD Risk Table ?to determine the patient's CHD Risk. ?       ?ATP III CLASSIFICATION (LDL): ? <100     mg/dL   Optimal ? 100-129  mg/dL   Near or Above ?                   Optimal ? 130-159  mg/dL   Borderline ? 160-189  mg/dL   High ? >190     mg/dL   Very High ?Performed at [facility], [place] [address], [place], [place] [address] ?  ?  ? ?+-------+-----------+-----------+------------+------------+  ?ABI/TBIToday's ABIToday's TBIPrevious ABIPrevious TBI  ?+-------+-----------+-----------+------------+------------+  ?Right  0.40       0.21                                  ?+-------+-----------+-----------+------------+------------+  ?Left   0.54       0.39                                 ?+-------+-----------+-----------+------------+------------+  ? ?Yevonne Aline. Stanford Breed, MD ?[facility]

## 2022-02-17 ENCOUNTER — Encounter: Payer: Self-pay | Admitting: Vascular Surgery

## 2022-02-17 ENCOUNTER — Ambulatory Visit (INDEPENDENT_AMBULATORY_CARE_PROVIDER_SITE_OTHER): Payer: Medicare (Managed Care) | Admitting: Vascular Surgery

## 2022-02-17 VITALS — BP 126/57 | HR 83 | Temp 97.5°F | Resp 14 | Ht 62.0 in | Wt 194.0 lb

## 2022-02-17 DIAGNOSIS — I739 Peripheral vascular disease, unspecified: Secondary | ICD-10-CM | POA: Diagnosis not present

## 2022-03-17 IMAGING — DX DG CHEST 1V PORT
1 series · 1 of 1 positions shown · non-contrast
Comparison: Chest x-ray 06/03/2013

CLINICAL DATA: Shortness of breath x-ray days, current smoker

EXAM:
PORTABLE CHEST 1 VIEW. Bilateral costophrenic angles collimated off
the

[chest ap]
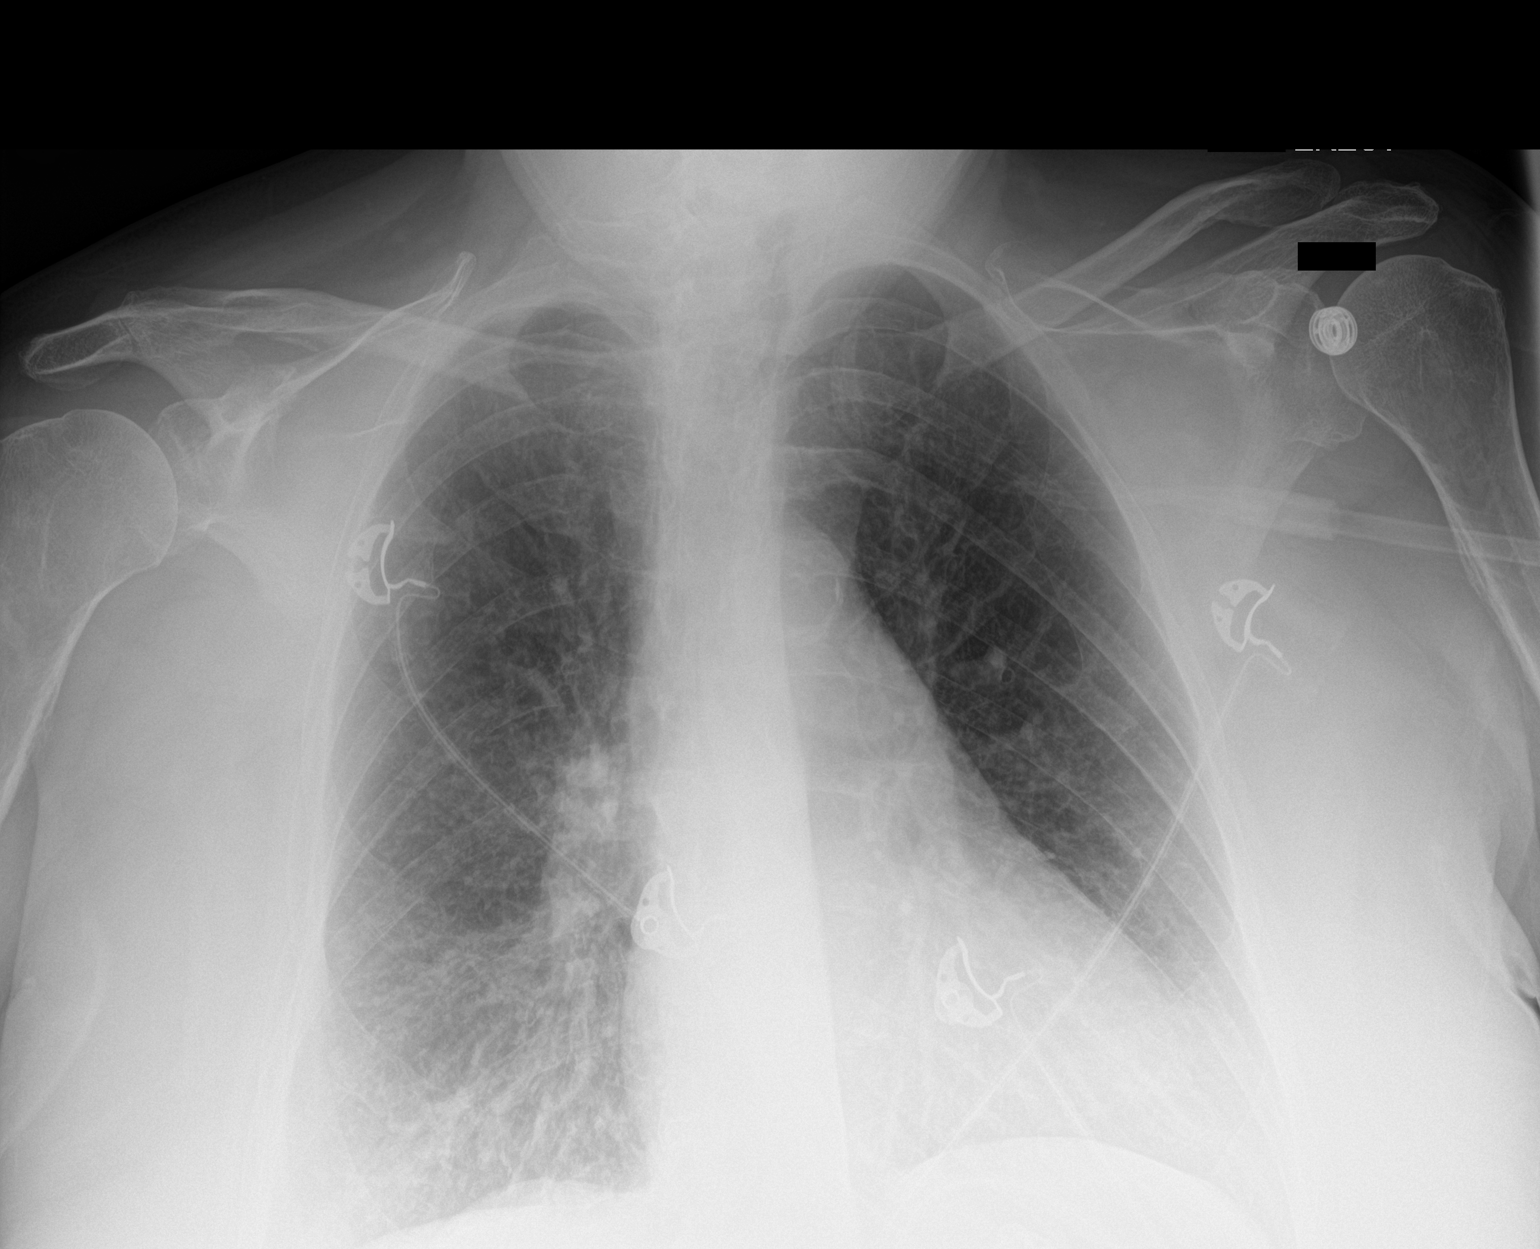

[1 of 1 positions shown; findings below may reference images not displayed]

FINDINGS: The heart size and mediastinal contours are unchanged. Aortic arch
calcifications.

Bibasilar increased interstitial markings. Hazy airspace opacity
within bilateral lower lobes. Limited evaluation for pleural
effusion with costophrenic angles collimated off view. No
pneumothorax.

No acute osseous abnormality.
IMPRESSION: 1. Increased interstitial markings and hazy airspace opacities
within bilateral lower lung zones suggestive of atypical/viral
pneumonia. Differential diagnosis includes pulmonary edema.
2. No definite pleural effusion; however, bilateral costophrenic
angles collimated off view and overlying soft tissues.

## 2022-05-05 ENCOUNTER — Ambulatory Visit: Payer: Medicare (Managed Care) | Admitting: Student

## 2022-05-10 ENCOUNTER — Ambulatory Visit: Payer: Medicare (Managed Care) | Admitting: Nurse Practitioner

## 2022-05-24 ENCOUNTER — Ambulatory Visit (INDEPENDENT_AMBULATORY_CARE_PROVIDER_SITE_OTHER): Payer: Medicare (Managed Care) | Admitting: Nurse Practitioner

## 2022-05-24 ENCOUNTER — Encounter: Payer: Self-pay | Admitting: Nurse Practitioner

## 2022-05-24 VITALS — BP 130/60 | HR 77 | Temp 97.9°F | Ht 62.0 in | Wt 190.4 lb

## 2022-05-24 DIAGNOSIS — J9611 Chronic respiratory failure with hypoxia: Secondary | ICD-10-CM

## 2022-05-24 DIAGNOSIS — Z720 Tobacco use: Secondary | ICD-10-CM

## 2022-05-24 DIAGNOSIS — F17210 Nicotine dependence, cigarettes, uncomplicated: Secondary | ICD-10-CM | POA: Diagnosis not present

## 2022-05-24 DIAGNOSIS — J449 Chronic obstructive pulmonary disease, unspecified: Secondary | ICD-10-CM | POA: Diagnosis not present

## 2022-05-24 HISTORY — DX: Chronic respiratory failure with hypoxia: J96.11

## 2022-05-24 MED ORDER — TRELEGY ELLIPTA 100-62.5-25 MCG/ACT IN AEPB
1.0000 | INHALATION_SPRAY | Freq: Every day | RESPIRATORY_TRACT | 5 refills | Status: DC
Start: 2022-05-24 — End: 2023-01-14

## 2022-05-24 MED ORDER — VARENICLINE TARTRATE 0.5 MG X 11 & 1 MG X 42 PO TBPK: ORAL_TABLET | ORAL | 0 refills | Status: DC

## 2022-05-24 MED ORDER — TRELEGY ELLIPTA 100-62.5-25 MCG/ACT IN AEPB: 1.0000 | INHALATION_SPRAY | Freq: Every day | RESPIRATORY_TRACT | 0 refills | Status: DC

## 2022-05-24 NOTE — Progress Notes (Unsigned)
$'@Patient'V$  ID: Deanna Petty, female    DOB: 08-Aug-1946, 76 y.o.   MRN: [id number]  Chief Complaint  Patient presents with   Follow-up    Patient says things are going okay.     Referring provider: Arthur Holms, NP  HPI: 76 year old female, active smoker followed for COPD, chronic respiratory failure on supplemental oxygen, mild OSA not on CPAP.  She is a patient of Dr. Matilde Bash last seen in office 08/05/2020.  Past medical history significant for CHF, hypertension, DM 2.  TEST/EVENTS:  09/18/2021 CTA chest: Mild cardiomegaly with moderate patchy three-vessel calcific CAD.  Heavy aortic calcification.  Shotty up to borderline lymph nodes continue to be noted in the precarinal, subcarinal and left-sided prevascular mediastinum, unchanged.  There is asymmetric posterior basal left lower lobe increased opacity with groundglass appearance, and an additional left lower lobe infrahilar groundglass opacity.  There is increasing bronchial thickening in both lower lobes as well.  Remaining lungs are clear. 01/17/2022 CXR 1 view: Improved aeration within the right lung with prominent interstitial markings remaining in the right lung base. 04/05/2022 CXR: Scarring/atelectasis in both lung bases.  No acute process  05/24/2022: Today-follow-up Patient presents today for overdue follow-up.  She was seen in the ED at the end of June for AECOPD.  She has also had multiple hospitalizations since we saw her last for acute on chronic respiratory failure and AECOPD.  Today, she reports that her breathing has been overall stable since her last ED visit.  She does notice that her oxygen drops when she is up walking but she does not wear her oxygen consistently. She is usually around the mid 80's with activity; recovers after resting for a period of time to the 90's. She feels like her legs get weak when she's up moving around too, which limits her activity tolerance. She has some occasionally wheezing, which she will use  her albuterol rescue for, usually once a day. She denies any cough, orthopnea, PND, leg swelling, hemoptysis, fevers, night sweats, anorexia, weight loss. She is currently using Anoro inhaler daily. Doesn't use her nebulizer machine. She is not on CPAP at night, sleeps okay at night. Denies drowsy driving or morning headaches. She is still smoking 2-3 cigarettes a day. Nicotine patches irritate her skin.   No Known Allergies  Immunization History  Administered Date(s) Administered   Fluad Quad(high Dose 65+) 07/05/2020   Moderna Sars-Covid-2 Vaccination 03/27/2020, 04/24/2020   Pneumococcal Polysaccharide-23 07/07/2020    Past Medical History:  Diagnosis Date   Bronchitis    Cigarette smoker    COPD (chronic obstructive pulmonary disease) ([facility])    Pneumonia    Shortness of breath    with exertion; lasted used inhaler 06/02/13    Tobacco History: Social History   Tobacco Use  Smoking Status Some Days   Packs/day: 0.25   Years: 40.00   Total pack years: 10.00   Types: Cigarettes  Smokeless Tobacco Never   Ready to quit: Not Answered Counseling given: Not Answered   Outpatient Medications Prior to Visit  Medication Sig Dispense Refill   Acetaminophen 500 MG capsule Take 500 mg by mouth every 6 (six) hours as needed for fever or pain.     albuterol (VENTOLIN HFA) 108 (90 Base) MCG/ACT inhaler Inhale 2 puffs into the lungs every 4 (four) hours as needed for wheezing or shortness of breath. 8 g 1   amLODipine (NORVASC) 10 MG tablet Take 10 mg by mouth daily.     atorvastatin (  LIPITOR) 10 MG tablet Take 10 mg by mouth at bedtime.     losartan (COZAAR) 100 MG tablet Take 100 mg by mouth daily.     metoprolol succinate (TOPROL-XL) 25 MG 24 hr tablet Take 25 mg by mouth daily.     pantoprazole (PROTONIX) 40 MG tablet Take 40 mg by mouth daily.     nicotine (NICODERM CQ - DOSED IN MG/24 HOURS) 14 mg/24hr patch Place 1 patch (14 mg total) onto the skin daily. 28 patch 0    umeclidinium-vilanterol (ANORO ELLIPTA) 62.5-25 MCG/INH AEPB Inhale 1 puff into the lungs daily. 60 each 3   albuterol (PROVENTIL) (2.5 MG/3ML) 0.083% nebulizer solution Use 1 vial (2.5 mg total) by nebulization every 6 (six) hours as needed for shortness of breath or wheezing. 180 mL 0   metFORMIN (GLUCOPHAGE) 500 MG tablet Take 1 tablet (500 mg total) by mouth 2 (two) times daily with a meal. 60 tablet 11   budesonide (PULMICORT) 0.5 MG/2ML nebulizer solution Take 2 mLs (0.5 mg total) by nebulization 2 (two) times daily. 120 mL 0   No facility-administered medications prior to visit.     Review of Systems:   Constitutional: No weight loss or gain, night sweats, fevers, chills, fatigue, or lassitude. HEENT: No headaches, difficulty swallowing, tooth/dental problems, or sore throat. No sneezing, itching, ear ache, nasal congestion, or post nasal drip CV:  No chest pain, orthopnea, PND, swelling in lower extremities, anasarca, dizziness, palpitations, syncope Resp: No shortness of breath with exertion or at rest. No excess mucus or change in color of mucus. No productive or non-productive. No hemoptysis. No wheezing.  No chest wall deformity GI:  No heartburn, indigestion, abdominal pain, nausea, vomiting, diarrhea, change in bowel habits, loss of appetite, bloody stools.  GU: No dysuria, change in color of urine, urgency or frequency.  No flank pain, no hematuria  Skin: No rash, lesions, ulcerations MSK:  No joint pain or swelling.  No decreased range of motion.  No back pain. Neuro: No dizziness or lightheadedness.  Psych: No depression or anxiety. Mood stable.     Physical Exam:  BP 130/60 (BP Location: Right Arm, Patient Position: Sitting, Cuff Size: Normal)   Pulse 77   Temp 97.9 F (36.6 C) (Oral)   Ht '5\' 2"'$  (1.575 m)   Wt 190 lb 6.4 oz (86.4 kg)   SpO2 96%   BMI 34.82 kg/m   GEN: Pleasant, interactive, well-nourished/chronically-ill appearing/acutely-ill  appearing/poorly-nourished/morbidly obese; in no acute distress.****** HEENT:  Normocephalic and atraumatic. EACs patent bilaterally. TM pearly gray with present light reflex bilaterally. PERRLA. Sclera white. Nasal turbinates pink, moist and patent bilaterally. No rhinorrhea present. Oropharynx pink and moist, without exudate or edema. No lesions, ulcerations, or postnasal drip.  NECK:  Supple w/ fair ROM. No JVD present. Normal carotid impulses w/o bruits. Thyroid symmetrical with no goiter or nodules palpated. No lymphadenopathy.   CV: RRR, no m/r/g, no peripheral edema. Pulses intact, +2 bilaterally. No cyanosis, pallor or clubbing. PULMONARY:  Unlabored, regular breathing. Clear bilaterally A&P w/o wheezes/rales/rhonchi. No accessory muscle use. No dullness to percussion. GI: BS present and normoactive. Soft, non-tender to palpation. No organomegaly or masses detected. No CVA tenderness. MSK: No erythema, warmth or tenderness. Cap refil <2 sec all extrem. No deformities or joint swelling noted.  Neuro: A/Ox3. No focal deficits noted.   Skin: Warm, no lesions or rashe Psych: Normal affect and behavior. Judgement and thought content appropriate.     Lab Results:  CBC  Component Value Date/Time   WBC 10.1 01/19/2022 0312   RBC 4.41 01/19/2022 0312   HGB 13.8 01/19/2022 0312   HCT 42.1 01/19/2022 0312   PLT 184 01/19/2022 0312   MCV 95.5 01/19/2022 0312   MCH 31.3 01/19/2022 0312   MCHC 32.8 01/19/2022 0312   RDW 14.9 01/19/2022 0312   LYMPHSABS 0.6 (L) 01/18/2022 0421   MONOABS 0.3 01/18/2022 0421   EOSABS 0.0 01/18/2022 0421   BASOSABS 0.0 01/18/2022 0421    BMET    Component Value Date/Time   NA 138 01/20/2022 0449   K 4.7 01/20/2022 0449   CL 96 (L) 01/20/2022 0449   CO2 39 (H) 01/20/2022 0449   GLUCOSE 120 (H) 01/20/2022 0449   BUN 24 (H) 01/20/2022 0449   CREATININE 0.93 01/20/2022 0449   CALCIUM 8.9 01/20/2022 0449   GFRNONAA >60 01/20/2022 0449   GFRAA >60  07/06/2020 0513    BNP    Component Value Date/Time   BNP 320.9 (H) 01/17/2022 0247     Imaging:  No results found.        No data to display          No results found for: "NITRICOXIDE"      Assessment & Plan:   COPD (chronic obstructive pulmonary disease) ([facility]) Severe COPD with high symptom burden and prone to frequent exacerbations. She does not appear to be in acute exacerbation today. We are going to step her up to triple therapy regimen with Trelegy. Continue prn albuterol.   Patient Instructions  Stop Anoro. Start Trelegy 1 puff daily. Brush tongue and rinse mouth afterwards  Continue Albuterol inhaler 2 puffs or 3 mL neb every 6 hours as needed for shortness of breath or wheezing. Notify if symptoms persist despite rescue inhaler/neb use.  Use your supplemental oxygen 3 lpm POC with activity and 2 lpm continuous at night. Goal oxygen greater than 88-90%. Orders sent to [employer] to get you a POC machine to use  Chantix pack. Take as directed on packaging. Monitor closely for any mood changes. Notify and stop if you develop any worsening mood.  Goal quit date for smoking: 06/24/2022  Referred to lung cancer screening program   Follow up in 6 weeks with Dr. Vaughan Browner. If symptoms do not improve or worsen, please contact office for sooner follow up or seek emergency care.     Chronic respiratory failure with hypoxia ([facility]) Not wearing oxygen consistently. We discussed the importance of use and correlated hypoxia to her symptoms with activity. Verbalized understanding. Qualified for POC today - orders sent to switch her DME to [employer] with POC 3 lpm with activity. She will continue to wear 2 lpm at night. Goal oxygen >88-90%  Tobacco use The patient's current tobacco use: 1/4 pack or less The patient was advised to quit and impact of smoking on their health.  I assessed the patient's willingness to attempt to quit. I provided methods and skills for cessation. We  reviewed medication management of smoking session drugs if appropriate. Switched to Chantix and stopped nicotine patches due to irritation.  Resources to help quit smoking were provided. A smoking cessation quit date was set: 06/24/2022 Follow-up was arranged in our clinic.  The amount of time spent counseling patient was 5 mins   Referred to lung cancer screening program.    I spent 35 minutes of dedicated to the care of this patient on the date of this encounter to include pre-visit review of records, face-to-face time  with the patient discussing conditions above, post visit ordering of testing, clinical documentation with the electronic health record, making appropriate referrals as documented, and communicating necessary findings to members of the patients care team.  Clayton Bibles, NP 05/24/2022  Pt aware and understands NP's role.

## 2022-05-24 NOTE — Assessment & Plan Note (Addendum)
Not wearing oxygen consistently. We discussed the importance of use and correlated hypoxia to her symptoms with activity. Verbalized understanding. Qualified for POC today - orders sent to switch her DME to [employer] with POC 3 lpm with activity. She will continue to wear 2 lpm at night. Goal oxygen >88-90%

## 2022-05-24 NOTE — Assessment & Plan Note (Signed)
The patient's current tobacco use: 1/4 pack or less The patient was advised to quit and impact of smoking on their health.  I assessed the patient's willingness to attempt to quit. I provided methods and skills for cessation. We reviewed medication management of smoking session drugs if appropriate. Switched to Chantix and stopped nicotine patches due to irritation.  Resources to help quit smoking were provided. A smoking cessation quit date was set: 06/24/2022 Follow-up was arranged in our clinic.  The amount of time spent counseling patient was 5 mins   Referred to lung cancer screening program.

## 2022-05-24 NOTE — Patient Instructions (Addendum)
Stop Anoro. Start Trelegy 1 puff daily. Brush tongue and rinse mouth afterwards  Continue Albuterol inhaler 2 puffs or 3 mL neb every 6 hours as needed for shortness of breath or wheezing. Notify if symptoms persist despite rescue inhaler/neb use.  Use your supplemental oxygen 3 lpm POC with activity and 2 lpm continuous at night. Goal oxygen greater than 88-90%. Orders sent to [employer] to get you a POC machine to use  Chantix pack. Take as directed on packaging. Monitor closely for any mood changes. Notify and stop if you develop any worsening mood.  Goal quit date for smoking: 06/24/2022  Referred to lung cancer screening program   Follow up in 6 weeks with Dr. Vaughan Browner. If symptoms do not improve or worsen, please contact office for sooner follow up or seek emergency care.

## 2022-05-24 NOTE — Assessment & Plan Note (Signed)
Severe COPD with high symptom burden and prone to frequent exacerbations. She does not appear to be in acute exacerbation today. We are going to step her up to triple therapy regimen with Trelegy. Continue prn albuterol.   Patient Instructions  Stop Anoro. Start Trelegy 1 puff daily. Brush tongue and rinse mouth afterwards  Continue Albuterol inhaler 2 puffs or 3 mL neb every 6 hours as needed for shortness of breath or wheezing. Notify if symptoms persist despite rescue inhaler/neb use.  Use your supplemental oxygen 3 lpm POC with activity and 2 lpm continuous at night. Goal oxygen greater than 88-90%. Orders sent to [employer] to get you a POC machine to use  Chantix pack. Take as directed on packaging. Monitor closely for any mood changes. Notify and stop if you develop any worsening mood.  Goal quit date for smoking: 06/24/2022  Referred to lung cancer screening program   Follow up in 6 weeks with Dr. Vaughan Browner. If symptoms do not improve or worsen, please contact office for sooner follow up or seek emergency care.

## 2022-05-25 ENCOUNTER — Encounter: Payer: Self-pay | Admitting: Nurse Practitioner

## 2022-07-25 ENCOUNTER — Encounter: Payer: Self-pay | Admitting: Pulmonary Disease

## 2022-07-25 ENCOUNTER — Ambulatory Visit (INDEPENDENT_AMBULATORY_CARE_PROVIDER_SITE_OTHER): Payer: Medicare (Managed Care) | Admitting: Pulmonary Disease

## 2022-07-25 VITALS — BP 130/52 | HR 75 | Temp 97.1°F | Ht 62.0 in | Wt 191.2 lb

## 2022-07-25 DIAGNOSIS — J449 Chronic obstructive pulmonary disease, unspecified: Secondary | ICD-10-CM

## 2022-07-25 DIAGNOSIS — F17210 Nicotine dependence, cigarettes, uncomplicated: Secondary | ICD-10-CM | POA: Diagnosis not present

## 2022-07-25 NOTE — Patient Instructions (Addendum)
Continue the Trelegy inhaler Continue to work on smoking cessation Follow-up in 6 months.

## 2022-07-25 NOTE — Progress Notes (Unsigned)
Deanna Petty    [id number]    1946-07-30  Primary Care Physician:Nguyen, Maudie Mercury, NP  Referring Physician: Arthur Holms, NP [name],  [name] [id number]  Chief complaint:  Follow up for COPD exacerbation, hypercarbic respiratory failure  HPI: 76 y.o.  heavy smoker admitted in September with hypoxic, hypercarbic respiratory failure in the setting of COPD exacerbation. PCCM consulted for hypoxic, hypercarbic respiratory failure and need for trilogy vent at home Assessment at that time was that she needs to get over her acute exacerbation and reassess in office after optimal treatment of COPD  She had been told that she had COPD but no PFTs are on record.  She had been on inhalers but ran out of medications around 2018 and never refilled them.  Pets: No pets Occupation: Retired Geophysicist/field seismologist Exposures: No known exposures.  No mold, hot tub, Jacuzzi.  No feather pillows or comforters Smoking history: Greater than 100-pack-year smoker.   Travel history: No significant travel history Relevant family history: No significant family history of lung disease.  Interim history: Anoro changed to Trelegy at last visit.  Has chronic dyspnea on exertion with no change.  Still smoking.  Outpatient Encounter Medications as of 07/25/2022  Medication Sig   Acetaminophen 500 MG capsule Take 500 mg by mouth every 6 (six) hours as needed for fever or pain.   albuterol (VENTOLIN HFA) 108 (90 Base) MCG/ACT inhaler Inhale 2 puffs into the lungs every 4 (four) hours as needed for wheezing or shortness of breath.   amLODipine (NORVASC) 10 MG tablet Take 10 mg by mouth daily.   atorvastatin (LIPITOR) 10 MG tablet Take 10 mg by mouth at bedtime.   Fluticasone-Umeclidin-Vilant (TRELEGY ELLIPTA) 100-62.5-25 MCG/ACT AEPB Inhale 1 puff into the lungs daily.   Fluticasone-Umeclidin-Vilant (TRELEGY ELLIPTA) 100-62.5-25 MCG/ACT AEPB Inhale 1 puff into the lungs daily.   losartan (COZAAR) 100 MG tablet Take  100 mg by mouth daily.   metoprolol succinate (TOPROL-XL) 25 MG 24 hr tablet Take 25 mg by mouth daily.   pantoprazole (PROTONIX) 40 MG tablet Take 40 mg by mouth daily.   varenicline (CHANTIX PAK) 0.5 MG X 11 & 1 MG X 42 tablet Take one 0.5 mg tablet by mouth once daily for 3 days, then increase to one 0.5 mg tablet twice daily for 4 days, then increase to one 1 mg tablet twice daily.   albuterol (PROVENTIL) (2.5 MG/3ML) 0.083% nebulizer solution Use 1 vial (2.5 mg total) by nebulization every 6 (six) hours as needed for shortness of breath or wheezing.   metFORMIN (GLUCOPHAGE) 500 MG tablet Take 1 tablet (500 mg total) by mouth 2 (two) times daily with a meal.   No facility-administered encounter medications on file as of 07/25/2022.    Physical Exam: Blood pressure (!) 130/52, pulse 75, temperature (!) 97.1 F (36.2 C), temperature source Oral, height '5\' 2"'$  (1.575 m), weight 191 lb 3.2 oz (86.7 kg), SpO2 95 %. Gen:      No acute distress HEENT:  EOMI, sclera anicteric Neck:     No masses; no thyromegaly Lungs:    Clear to auscultation bilaterally; normal respiratory effort CV:         Regular rate and rhythm; no murmurs Abd:      + bowel sounds; soft, non-tender; no palpable masses, no distension Ext:    No edema; adequate peripheral perfusion Skin:      Warm and dry; no rash Neuro: alert and  oriented x 3 Psych: normal mood and affect   Data Reviewed: Imaging: Chest x-ray 07/03/20-hazy interstitial lower lung opacities.  CTA 07/03/20-no PE, minimal lower lobe atelectasis. I have reviewed the images personally  PFTs:  Labs: CBC 01/18/2022-WBC 10.8, eos 0% Alpha-1 antitrypsin 08/05/2020-147, PI MM  Sleep: Home sleep study 09/08/2020-mild OSA, AHI 6.1, desats to 79%   Cardiac: Echo 07/04/20 LVEF 60-65%, mild LVH grade 1 diastolic dysfunction.  RV systolic function is normal  Assessment:  COPD Switch to Trelegy inhaler recently.  She seems to be doing okay on those Schedule  PFTs for evaluation of COPD  Active smoker Discussed smoking cessation in detail with patient She wants to quit on her own.  Time spent counseling-5 minutes Reassess at return visit. Referral for low-dose screening CT has already been placed.  Plan/Recommendations: Continue Trelegy inhaler PFTs Smoking cessation  Marshell Garfinkel MD [facility] 07/25/2022, 9:44 AM  CC: Arthur Holms, NP

## 2022-07-27 ENCOUNTER — Other Ambulatory Visit: Payer: Self-pay | Admitting: *Deleted

## 2022-07-27 DIAGNOSIS — J449 Chronic obstructive pulmonary disease, unspecified: Secondary | ICD-10-CM

## 2022-10-11 ENCOUNTER — Emergency Department (HOSPITAL_COMMUNITY)
Admission: EM | Admit: 2022-10-11 | Discharge: 2022-10-11 | Discharge status: Against Medical Advice | Payer: Medicare (Managed Care)

## 2022-10-11 NOTE — ED Notes (Signed)
Family turned labels and 02 tank in to registration then left.

## 2022-10-26 ENCOUNTER — Encounter: Payer: Self-pay | Admitting: Pulmonary Disease

## 2022-10-26 ENCOUNTER — Ambulatory Visit (INDEPENDENT_AMBULATORY_CARE_PROVIDER_SITE_OTHER): Payer: Medicare (Managed Care) | Admitting: Pulmonary Disease

## 2022-10-26 VITALS — BP 140/58 | HR 78 | Temp 97.7°F | Ht 62.0 in | Wt 186.0 lb

## 2022-10-26 DIAGNOSIS — J449 Chronic obstructive pulmonary disease, unspecified: Secondary | ICD-10-CM

## 2022-10-26 DIAGNOSIS — Z720 Tobacco use: Secondary | ICD-10-CM

## 2022-10-26 NOTE — Progress Notes (Signed)
Deanna Petty    [id number]    01/25/1946  Primary Care Physician:Pcp, No  Referring Physician: No referring provider defined for this encounter.  Chief complaint:  Follow up for COPD exacerbation, hypercarbic respiratory failure  HPI: 77 y.o.  heavy smoker admitted in September with hypoxic, hypercarbic respiratory failure in the setting of COPD exacerbation. PCCM consulted for hypoxic, hypercarbic respiratory failure and need for trilogy vent at home Assessment at that time was that she needs to get over her acute exacerbation and reassess in office after optimal treatment of COPD  She had been told that she had COPD but no PFTs are on record.  She had been on inhalers but ran out of medications around 2018 and never refilled them.  Pets: No pets Occupation: Retired Geophysicist/field seismologist Exposures: No known exposures.  No mold, hot tub, Jacuzzi.  No feather pillows or comforters Smoking history: Greater than 100-pack-year smoker.   Travel history: No significant travel history Relevant family history: No significant family history of lung disease.  Interim history: Continues on Trelegy inhaler.  Chantix prescribed at last visit but she is still smoking Here for POC qualification.  Outpatient Encounter Medications as of 10/26/2022  Medication Sig   Acetaminophen 500 MG capsule Take 500 mg by mouth every 6 (six) hours as needed for fever or pain.   albuterol (PROVENTIL) (2.5 MG/3ML) 0.083% nebulizer solution Use 1 vial (2.5 mg total) by nebulization every 6 (six) hours as needed for shortness of breath or wheezing.   albuterol (VENTOLIN HFA) 108 (90 Base) MCG/ACT inhaler Inhale 2 puffs into the lungs every 4 (four) hours as needed for wheezing or shortness of breath.   amLODipine (NORVASC) 10 MG tablet Take 10 mg by mouth daily.   atorvastatin (LIPITOR) 10 MG tablet Take 10 mg by mouth at bedtime.   Fluticasone-Umeclidin-Vilant (TRELEGY ELLIPTA) 100-62.5-25 MCG/ACT AEPB Inhale 1 puff  into the lungs daily.   Fluticasone-Umeclidin-Vilant (TRELEGY ELLIPTA) 100-62.5-25 MCG/ACT AEPB Inhale 1 puff into the lungs daily.   losartan (COZAAR) 100 MG tablet Take 100 mg by mouth daily.   metFORMIN (GLUCOPHAGE) 500 MG tablet Take 1 tablet (500 mg total) by mouth 2 (two) times daily with a meal.   metoprolol succinate (TOPROL-XL) 25 MG 24 hr tablet Take 25 mg by mouth daily.   pantoprazole (PROTONIX) 40 MG tablet Take 40 mg by mouth daily.   varenicline (CHANTIX PAK) 0.5 MG X 11 & 1 MG X 42 tablet Take one 0.5 mg tablet by mouth once daily for 3 days, then increase to one 0.5 mg tablet twice daily for 4 days, then increase to one 1 mg tablet twice daily.   No facility-administered encounter medications on file as of 10/26/2022.    Physical Exam: Blood pressure (!) 140/58, pulse 78, temperature 97.7 F (36.5 C), temperature source Oral, height '5\' 2"'$  (1.575 m), weight 186 lb (84.4 kg), SpO2 94 %. Gen:      No acute distress HEENT:  EOMI, sclera anicteric Neck:     No masses; no thyromegaly Lungs:    Clear to auscultation bilaterally; normal respiratory effort CV:         Regular rate and rhythm; no murmurs Abd:      + bowel sounds; soft, non-tender; no palpable masses, no distension Ext:    No edema; adequate peripheral perfusion Skin:      Warm and dry; no rash Neuro: alert and oriented x 3 Psych: normal mood and  affect   Data Reviewed: Imaging: Chest x-ray 07/03/20-hazy interstitial lower lung opacities.  CTA 07/03/20-no PE, minimal lower lobe atelectasis. CTA 09/18/2021-lower lobe bronchial thickening, no pulmonary embolism I have reviewed the images personally  PFTs:  Labs: CBC 01/18/2022-WBC 10.8, eos 0% Alpha-1 antitrypsin 08/05/2020-147, PI MM  Sleep: Home sleep study 09/08/2020-mild OSA, AHI 6.1, desats to 79%   Cardiac: Echo 07/04/20 LVEF 60-65%, mild LVH grade 1 diastolic dysfunction.  RV systolic function is normal  Assessment:  COPD Continue Trelegy  inhaler Schedule PFTs for evaluation of COPD  Active smoker Discussed smoking cessation in detail with patient She wants to quit on her own.  Time spent counseling-5 minutes Reassess at return visit. Referral for low-dose screening CT has already been placed but not completed yet.  We will repeat order.  Plan/Recommendations: Continue Trelegy inhaler PFTs Smoking cessation Low-dose screening CT  Marshell Garfinkel MD [facility] 10/26/2022, 1:26 PM  CC: No ref. provider found

## 2022-10-26 NOTE — Addendum Note (Signed)
Addended by: Elton Sin on: 10/26/2022 02:26 PM   Modules accepted: Orders

## 2022-10-26 NOTE — Patient Instructions (Signed)
Continue the Trelegy Continue to work on smoking cessation Will order the Marine scientist Will refer for lung cancer screening CT Order PFTs in 6 months and return to clinic in 6 months after PFTs

## 2022-11-03 ENCOUNTER — Telehealth: Payer: Self-pay | Admitting: Pulmonary Disease

## 2022-11-03 NOTE — Telephone Encounter (Signed)
Fax received from [facility].  [employer] referral order form completed and faxed to attention Dwyane Luo [phone].  Insurance information and last office note requested with fax sent.  Confirmation received.  Nothing further at this time.

## 2022-11-14 NOTE — Telephone Encounter (Signed)
Fax received from [facility] for additional O2 concentrator.  Additional O2 concentrator was denied by insurance.

## 2022-11-29 ENCOUNTER — Telehealth: Payer: Self-pay | Admitting: Pulmonary Disease

## 2022-11-30 NOTE — Telephone Encounter (Signed)
ATC patient. It rang and then the line went busy. Will try to call back.

## 2022-11-30 NOTE — Telephone Encounter (Signed)
Called and spoke with patient. Patient stated [employer] is needing DME order sent for POC processing.  Patient is requesting another POC order be placed to [employer]. Order was placed 10/26/22.  Will route message to [facility] to assist

## 2022-12-01 NOTE — Telephone Encounter (Signed)
Called [employer] and spoke to Legrand Como - he states they have our order but it is in the prior auth status.  Insurance initially denied due to pt was already on service with another company.  They resubmitted with a coordinated date of pick up so pt will not have lapse in O2 and have not heard back from insurance yet.  Spoke to pt & made her aware of status and gave her Michael's phone #.  Nothing further needed.

## 2023-01-12 ENCOUNTER — Inpatient Hospital Stay (HOSPITAL_COMMUNITY)
Admission: EM | Admit: 2023-01-12 | Discharge: 2023-01-18 | DRG: 190 | Disposition: A | Discharge status: Home / Self Care | Payer: Medicare (Managed Care) | Attending: Internal Medicine | Admitting: Internal Medicine

## 2023-01-12 ENCOUNTER — Emergency Department (HOSPITAL_COMMUNITY): Payer: Medicare (Managed Care)

## 2023-01-12 DIAGNOSIS — Z8701 Personal history of pneumonia (recurrent): Secondary | ICD-10-CM

## 2023-01-12 DIAGNOSIS — E1165 Type 2 diabetes mellitus with hyperglycemia: Secondary | ICD-10-CM | POA: Diagnosis present

## 2023-01-12 DIAGNOSIS — J449 Chronic obstructive pulmonary disease, unspecified: Secondary | ICD-10-CM | POA: Diagnosis not present

## 2023-01-12 DIAGNOSIS — I21A1 Myocardial infarction type 2: Secondary | ICD-10-CM | POA: Diagnosis not present

## 2023-01-12 DIAGNOSIS — Z720 Tobacco use: Secondary | ICD-10-CM | POA: Diagnosis not present

## 2023-01-12 DIAGNOSIS — I480 Paroxysmal atrial fibrillation: Secondary | ICD-10-CM

## 2023-01-12 DIAGNOSIS — Z90710 Acquired absence of both cervix and uterus: Secondary | ICD-10-CM

## 2023-01-12 DIAGNOSIS — Z7951 Long term (current) use of inhaled steroids: Secondary | ICD-10-CM

## 2023-01-12 DIAGNOSIS — I2510 Atherosclerotic heart disease of native coronary artery without angina pectoris: Secondary | ICD-10-CM | POA: Diagnosis present

## 2023-01-12 DIAGNOSIS — I214 Non-ST elevation (NSTEMI) myocardial infarction: Secondary | ICD-10-CM | POA: Diagnosis present

## 2023-01-12 DIAGNOSIS — Z9981 Dependence on supplemental oxygen: Secondary | ICD-10-CM

## 2023-01-12 DIAGNOSIS — G4733 Obstructive sleep apnea (adult) (pediatric): Secondary | ICD-10-CM | POA: Diagnosis present

## 2023-01-12 DIAGNOSIS — Z833 Family history of diabetes mellitus: Secondary | ICD-10-CM

## 2023-01-12 DIAGNOSIS — J441 Chronic obstructive pulmonary disease with (acute) exacerbation: Principal | ICD-10-CM

## 2023-01-12 DIAGNOSIS — I110 Hypertensive heart disease with heart failure: Secondary | ICD-10-CM | POA: Diagnosis present

## 2023-01-12 DIAGNOSIS — Z66 Do not resuscitate: Secondary | ICD-10-CM | POA: Diagnosis present

## 2023-01-12 DIAGNOSIS — J9621 Acute and chronic respiratory failure with hypoxia: Secondary | ICD-10-CM | POA: Diagnosis not present

## 2023-01-12 DIAGNOSIS — E669 Obesity, unspecified: Secondary | ICD-10-CM | POA: Diagnosis present

## 2023-01-12 DIAGNOSIS — E875 Hyperkalemia: Secondary | ICD-10-CM | POA: Diagnosis not present

## 2023-01-12 DIAGNOSIS — E876 Hypokalemia: Secondary | ICD-10-CM | POA: Diagnosis present

## 2023-01-12 DIAGNOSIS — Z7984 Long term (current) use of oral hypoglycemic drugs: Secondary | ICD-10-CM

## 2023-01-12 DIAGNOSIS — D6869 Other thrombophilia: Secondary | ICD-10-CM | POA: Diagnosis present

## 2023-01-12 DIAGNOSIS — I5033 Acute on chronic diastolic (congestive) heart failure: Secondary | ICD-10-CM | POA: Diagnosis present

## 2023-01-12 DIAGNOSIS — F17210 Nicotine dependence, cigarettes, uncomplicated: Secondary | ICD-10-CM | POA: Diagnosis present

## 2023-01-12 DIAGNOSIS — I350 Nonrheumatic aortic (valve) stenosis: Secondary | ICD-10-CM | POA: Diagnosis not present

## 2023-01-12 DIAGNOSIS — J9611 Chronic respiratory failure with hypoxia: Secondary | ICD-10-CM | POA: Diagnosis present

## 2023-01-12 DIAGNOSIS — Z79899 Other long term (current) drug therapy: Secondary | ICD-10-CM

## 2023-01-12 DIAGNOSIS — Z1152 Encounter for screening for COVID-19: Secondary | ICD-10-CM

## 2023-01-12 DIAGNOSIS — I5032 Chronic diastolic (congestive) heart failure: Secondary | ICD-10-CM | POA: Diagnosis present

## 2023-01-12 DIAGNOSIS — J9622 Acute and chronic respiratory failure with hypercapnia: Secondary | ICD-10-CM | POA: Diagnosis present

## 2023-01-12 DIAGNOSIS — E785 Hyperlipidemia, unspecified: Secondary | ICD-10-CM

## 2023-01-12 DIAGNOSIS — R7989 Other specified abnormal findings of blood chemistry: Secondary | ICD-10-CM

## 2023-01-12 DIAGNOSIS — I10 Essential (primary) hypertension: Secondary | ICD-10-CM | POA: Diagnosis not present

## 2023-01-12 DIAGNOSIS — I4891 Unspecified atrial fibrillation: Secondary | ICD-10-CM

## 2023-01-12 DIAGNOSIS — E1151 Type 2 diabetes mellitus with diabetic peripheral angiopathy without gangrene: Secondary | ICD-10-CM | POA: Diagnosis present

## 2023-01-12 DIAGNOSIS — R911 Solitary pulmonary nodule: Secondary | ICD-10-CM | POA: Diagnosis present

## 2023-01-12 DIAGNOSIS — E1169 Type 2 diabetes mellitus with other specified complication: Secondary | ICD-10-CM

## 2023-01-12 DIAGNOSIS — I5031 Acute diastolic (congestive) heart failure: Secondary | ICD-10-CM | POA: Diagnosis not present

## 2023-01-12 DIAGNOSIS — I4510 Unspecified right bundle-branch block: Secondary | ICD-10-CM | POA: Diagnosis present

## 2023-01-12 DIAGNOSIS — Z716 Tobacco abuse counseling: Secondary | ICD-10-CM

## 2023-01-12 DIAGNOSIS — Z6834 Body mass index (BMI) 34.0-34.9, adult: Secondary | ICD-10-CM

## 2023-01-12 DIAGNOSIS — Z91199 Patient's noncompliance with other medical treatment and regimen due to unspecified reason: Secondary | ICD-10-CM

## 2023-01-12 HISTORY — DX: Paroxysmal atrial fibrillation: I48.0

## 2023-01-12 HISTORY — DX: Unspecified atrial fibrillation: I48.91

## 2023-01-12 HISTORY — DX: Chronic obstructive pulmonary disease with (acute) exacerbation: J44.1

## 2023-01-12 HISTORY — DX: Myocardial infarction type 2: I21.A1

## 2023-01-12 HISTORY — DX: Hypokalemia: E87.6

## 2023-01-12 LAB — URINALYSIS, COMPLETE (UACMP) WITH MICROSCOPIC
Bacteria, UA: NONE SEEN
Bilirubin Urine: NEGATIVE
Glucose, UA: 50 mg/dL — AB
Ketones, ur: NEGATIVE mg/dL
Leukocytes,Ua: NEGATIVE
Nitrite: NEGATIVE
Protein, ur: 100 mg/dL — AB
Specific Gravity, Urine: >1.046 — ABNORMAL HIGH (ref 1.005–1.030)
pH: 6 (ref 5.0–8.0)

## 2023-01-12 LAB — CBC WITH DIFFERENTIAL/PLATELET
Abs Immature Granulocytes: 0.03 10*3/uL (ref 0.00–0.07)
Abs Immature Granulocytes: 0.05 10*3/uL (ref 0.00–0.07)
Basophils Absolute: 0 10*3/uL (ref 0.0–0.1)
Basophils Absolute: 0 10*3/uL (ref 0.0–0.1)
Basophils Relative: 1 %
Basophils Relative: 0 %
Eosinophils Absolute: 0.1 10*3/uL (ref 0.0–0.5)
Eosinophils Absolute: 0 10*3/uL (ref 0.0–0.5)
Eosinophils Relative: 1 %
Eosinophils Relative: 0 %
HCT: 33.3 % — ABNORMAL LOW (ref 36.0–46.0)
HCT: 44 % (ref 36.0–46.0)
Hemoglobin: 10.2 g/dL — ABNORMAL LOW (ref 12.0–15.0)
Hemoglobin: 13.5 g/dL (ref 12.0–15.0)
Immature Granulocytes: 0 %
Immature Granulocytes: 1 %
Lymphocytes Relative: 21 %
Lymphocytes Relative: 7 %
Lymphs Abs: 1.5 10*3/uL (ref 0.7–4.0)
Lymphs Abs: 0.6 10*3/uL — ABNORMAL LOW (ref 0.7–4.0)
MCH: 30.4 pg (ref 26.0–34.0)
MCH: 30 pg (ref 26.0–34.0)
MCHC: 30.6 g/dL (ref 30.0–36.0)
MCHC: 30.7 g/dL (ref 30.0–36.0)
MCV: 99.4 fL (ref 80.0–100.0)
MCV: 97.8 fL (ref 80.0–100.0)
Monocytes Absolute: 0.4 10*3/uL (ref 0.1–1.0)
Monocytes Absolute: 0 10*3/uL — ABNORMAL LOW (ref 0.1–1.0)
Monocytes Relative: 6 %
Monocytes Relative: 0 %
Neutro Abs: 4.9 10*3/uL (ref 1.7–7.7)
Neutro Abs: 7.7 10*3/uL (ref 1.7–7.7)
Neutrophils Relative %: 71 %
Neutrophils Relative %: 92 %
Platelets: 115 10*3/uL — ABNORMAL LOW (ref 150–400)
Platelets: 173 10*3/uL (ref 150–400)
RBC: 3.35 MIL/uL — ABNORMAL LOW (ref 3.87–5.11)
RBC: 4.5 MIL/uL (ref 3.87–5.11)
RDW: 12.7 % (ref 11.5–15.5)
RDW: 12.7 % (ref 11.5–15.5)
WBC: 7 10*3/uL (ref 4.0–10.5)
WBC: 8.4 10*3/uL (ref 4.0–10.5)
nRBC: 0 % (ref 0.0–0.2)
nRBC: 0 % (ref 0.0–0.2)

## 2023-01-12 LAB — COMPREHENSIVE METABOLIC PANEL
ALT: 18 U/L (ref 0–44)
AST: 18 U/L (ref 15–41)
Albumin: 1.9 g/dL — ABNORMAL LOW (ref 3.5–5.0)
Alkaline Phosphatase: 46 U/L (ref 38–126)
Anion gap: 8 (ref 5–15)
BUN: 11 mg/dL (ref 8–23)
CO2: 25 mmol/L (ref 22–32)
Calcium: 6.2 mg/dL — CL (ref 8.9–10.3)
Chloride: 111 mmol/L (ref 98–111)
Creatinine, Ser: 0.61 mg/dL (ref 0.44–1.00)
GFR, Estimated: >60 mL/min (ref >60)
Glucose, Bld: 149 mg/dL — ABNORMAL HIGH (ref 70–99)
Potassium: 2.8 mmol/L — ABNORMAL LOW (ref 3.5–5.1)
Sodium: 144 mmol/L (ref 135–145)
Total Bilirubin: 0.4 mg/dL (ref 0.3–1.2)
Total Protein: 3.9 g/dL — ABNORMAL LOW (ref 6.5–8.1)

## 2023-01-12 LAB — I-STAT VENOUS BLOOD GAS, ED
Acid-Base Excess: 9 mmol/L — ABNORMAL HIGH (ref 0.0–2.0)
Acid-Base Excess: 10 mmol/L — ABNORMAL HIGH (ref 0.0–2.0)
Bicarbonate: 39.1 mmol/L — ABNORMAL HIGH (ref 20.0–28.0)
Bicarbonate: 39.6 mmol/L — ABNORMAL HIGH (ref 20.0–28.0)
Calcium, Ion: 1.22 mmol/L (ref 1.15–1.40)
Calcium, Ion: 1.18 mmol/L (ref 1.15–1.40)
HCT: 39 % (ref 36.0–46.0)
HCT: 43 % (ref 36.0–46.0)
Hemoglobin: 13.3 g/dL (ref 12.0–15.0)
Hemoglobin: 14.6 g/dL (ref 12.0–15.0)
O2 Saturation: 73 %
O2 Saturation: 57 %
Potassium: 4.4 mmol/L (ref 3.5–5.1)
Potassium: 5.3 mmol/L — ABNORMAL HIGH (ref 3.5–5.1)
Sodium: 141 mmol/L (ref 135–145)
Sodium: 137 mmol/L (ref 135–145)
TCO2: 42 mmol/L — ABNORMAL HIGH (ref 22–32)
TCO2: 42 mmol/L — ABNORMAL HIGH (ref 22–32)
pCO2, Ven: 81.7 mmHg (ref 44–60)
pCO2, Ven: 74.8 mmHg (ref 44–60)
pH, Ven: 7.288 (ref 7.25–7.43)
pH, Ven: 7.332 (ref 7.25–7.43)
pO2, Ven: 45 mmHg (ref 32–45)
pO2, Ven: 33 mmHg (ref 32–45)

## 2023-01-12 LAB — TROPONIN I (HIGH SENSITIVITY)
Troponin I (High Sensitivity): 20 ng/L — ABNORMAL HIGH (ref <18)
Troponin I (High Sensitivity): 2667 ng/L (ref <18)

## 2023-01-12 LAB — RESP PANEL BY RT-PCR (RSV, FLU A&B, COVID)  RVPGX2
Influenza A by PCR: NEGATIVE
Influenza B by PCR: NEGATIVE
Resp Syncytial Virus by PCR: NEGATIVE
SARS Coronavirus 2 by RT PCR: NEGATIVE

## 2023-01-12 LAB — BRAIN NATRIURETIC PEPTIDE: B Natriuretic Peptide: 140.7 pg/mL — ABNORMAL HIGH (ref 0.0–100.0)

## 2023-01-12 LAB — MAGNESIUM: Magnesium: 1.1 mg/dL — ABNORMAL LOW (ref 1.7–2.4)

## 2023-01-12 MED ORDER — MAGNESIUM SULFATE 2 GM/50ML IV SOLN
2.0000 g | Freq: Once | INTRAVENOUS | Status: AC
  Administered 2023-01-12: 2 g via INTRAVENOUS
  Filled 2023-01-12: qty 50

## 2023-01-12 MED ORDER — IPRATROPIUM BROMIDE 0.02 % IN SOLN
0.5000 mg | Freq: Once | RESPIRATORY_TRACT | Status: AC
  Administered 2023-01-12: 0.5 mg via RESPIRATORY_TRACT
  Filled 2023-01-12: qty 2.5

## 2023-01-12 MED ORDER — POTASSIUM CHLORIDE 20 MEQ PO PACK
20.0000 meq | PACK | Freq: Once | ORAL | Status: AC
  Administered 2023-01-12: 20 meq via ORAL
  Filled 2023-01-12: qty 1

## 2023-01-12 MED ORDER — LEVALBUTEROL HCL 1.25 MG/0.5ML IN NEBU
1.2500 mg | INHALATION_SOLUTION | Freq: Once | RESPIRATORY_TRACT | Status: AC
  Administered 2023-01-12: 1.25 mg via RESPIRATORY_TRACT
  Filled 2023-01-12: qty 0.5

## 2023-01-12 MED ORDER — HEPARIN (PORCINE) 25000 UT/250ML-% IV SOLN
850.0000 [IU]/h | INTRAVENOUS | Status: AC
  Administered 2023-01-12: 800 [IU]/h via INTRAVENOUS
  Administered 2023-01-13: 850 [IU]/h via INTRAVENOUS
  Filled 2023-01-12 (×2): qty 250

## 2023-01-12 MED ORDER — IOHEXOL 350 MG/ML SOLN
75.0000 mL | Freq: Once | INTRAVENOUS | Status: AC | PRN
  Administered 2023-01-12: 75 mL via INTRAVENOUS

## 2023-01-12 MED ORDER — HEPARIN BOLUS VIA INFUSION
4000.0000 [IU] | Freq: Once | INTRAVENOUS | Status: AC
  Administered 2023-01-12: 4000 [IU] via INTRAVENOUS
  Filled 2023-01-12: qty 4000

## 2023-01-12 MED ORDER — METHYLPREDNISOLONE SODIUM SUCC 125 MG IJ SOLR
125.0000 mg | Freq: Once | INTRAMUSCULAR | Status: AC
  Administered 2023-01-12: 125 mg via INTRAVENOUS
  Filled 2023-01-12: qty 2

## 2023-01-12 MED ORDER — DILTIAZEM HCL 25 MG/5ML IV SOLN
10.0000 mg | Freq: Once | INTRAVENOUS | Status: AC
Start: 2023-01-12 — End: 2023-01-12
  Administered 2023-01-12: 10 mg via INTRAVENOUS
  Filled 2023-01-12: qty 5

## 2023-01-12 MED ORDER — INSULIN ASPART 100 UNIT/ML IJ SOLN
0.0000 [IU] | INTRAMUSCULAR | Status: DC
  Administered 2023-01-13: 1 [IU] via SUBCUTANEOUS
  Administered 2023-01-13 (×3): 5 [IU] via SUBCUTANEOUS
  Administered 2023-01-13 (×2): 2 [IU] via SUBCUTANEOUS
  Administered 2023-01-14: 3 [IU] via SUBCUTANEOUS
  Administered 2023-01-14: 2 [IU] via SUBCUTANEOUS
  Administered 2023-01-14: 5 [IU] via SUBCUTANEOUS
  Administered 2023-01-14: 2 [IU] via SUBCUTANEOUS
  Administered 2023-01-14 (×2): 3 [IU] via SUBCUTANEOUS
  Administered 2023-01-15 (×2): 2 [IU] via SUBCUTANEOUS
  Administered 2023-01-15: 3 [IU] via SUBCUTANEOUS
  Administered 2023-01-15: 2 [IU] via SUBCUTANEOUS
  Administered 2023-01-15: 1 [IU] via SUBCUTANEOUS
  Administered 2023-01-16: 2 [IU] via SUBCUTANEOUS

## 2023-01-12 NOTE — Assessment & Plan Note (Signed)
this patient has acute respiratory failure with Hypoxia and  Hypercarbia as documented by the presence of following: O2 saturatio< 90% on RA  Likely due to:   COPD exacerbation,   Provide O2 therapy and titrate as needed  Continuous pulse ox   check Pulse ox with ambulation prior to discharge   may need  TC consult for home O2 set up    flutter valve ordered

## 2023-01-12 NOTE — Progress Notes (Addendum)
ANTICOAGULATION CONSULT NOTE - Initial Consult  Pharmacy Consult for heparin  Indication: chest pain/ACS, new onset afib with RVR   No Known Allergies  Patient Measurements: Weight: 84.4 kg (186 lb 1.1 oz) Heparin Dosing Weight: 69.2kg   Vital Signs: Temp: 98.2 F (36.8 C) (03/28 1850) Temp Source: Oral (03/28 1850) BP: 144/78 (03/28 1930) Pulse Rate: 71 (03/28 1930)  Labs: Recent Labs    01/12/23 1545 01/12/23 1625 01/12/23 1940  HGB 10.2* 13.3  --   HCT 33.3* 39.0  --   PLT 115*  --   --   CREATININE 0.61  --   --   TROPONINIHS 20*  --  2,667*    Estimated Creatinine Clearance: 61.2 mL/min (by C-G formula based on SCr of 0.61 mg/dL).   Medical History: Past Medical History:  Diagnosis Date   Bronchitis    Cigarette smoker    COPD (chronic obstructive pulmonary disease) (HCC)    Pneumonia    Shortness of breath    with exertion; lasted used inhaler 06/02/13    Assessment: Patient presented with weakness and fatigue. Trop elevation up to 2,667, CBC stable. Noted thrombocytopenia, appears chronic. Not on anticoagulation prior to admission.   Patient also with new onset afib with RVR.   Goal of Therapy:  Heparin level 0.3-0.7 units/ml Monitor platelets by anticoagulation protocol: Yes   Plan:  Give 4000 units bolus x 1 Start heparin infusion at 800 units/hr Check anti-Xa level in 8 hours and daily while on heparin Continue to monitor H&H and platelets  Esmeralda Arthur, PharmD, BCCCP  01/12/2023,9:01 PM

## 2023-01-12 NOTE — Assessment & Plan Note (Addendum)
Continue to cycle cardiac enzymes Cardiac Panel (last 3 results) Recent Labs    01/12/23 1545 01/12/23 1940 01/12/23 2258  CKTOTAL  --   --  256*  TROPONINIHS 20* 2,667* 5,742*   Keep NPo for possible cath Aspirin 365 mg po Discussed with cardiology  No CP  No PE  Obtain echogram Appreciate cardiology consult  Possible demand ischemia vs silent NStEMI patient currently chest pain-free

## 2023-01-12 NOTE — Assessment & Plan Note (Signed)
Doubt was true, I suspect labs were diluted will repeat and follow Hold off on further replacement

## 2023-01-12 NOTE — ED Notes (Signed)
Pt placed back on 4L O2- 92-93% sats

## 2023-01-12 NOTE — ED Provider Notes (Signed)
[facility] Provider Note   CSN: [id number] Arrival date & time: 01/12/23  1531     History  Chief Complaint  Patient presents with   Tachycardia    AFIB    Deanna Petty is a 77 y.o. female with PMH T2DM, COPD on 3 L [place] oxygen at baseline, HTN presenting with shortness of breath, increased oxygen requirement at home while she was walking to her chart from the bathroom.  She has had a couple episodes of diarrhea today, and 1 episode of NBNB emesis. She states that the sob didn't improve w/ 5L. She denies chest pain, HA, numbness/weakenss in extremities, abd pain, dysuria, hematuria, hematochezia /melena.   HPI     Home Medications Prior to Admission medications   Medication Sig Start Date End Date Taking? Authorizing Provider  Acetaminophen 500 MG capsule Take 500 mg by mouth every 6 (six) hours as needed for fever or pain.    [provider]  albuterol (PROVENTIL) (2.5 MG/3ML) 0.083% nebulizer solution Use 1 vial (2.5 mg total) by nebulization every 6 (six) hours as needed for shortness of breath or wheezing. 01/20/22 02/19/22  Arrien, Jimmy Picket, MD  albuterol (VENTOLIN HFA) 108 (90 Base) MCG/ACT inhaler Inhale 2 puffs into the lungs every 4 (four) hours as needed for wheezing or shortness of breath. 01/20/22   Arrien, Jimmy Picket, MD  amLODipine (NORVASC) 10 MG tablet Take 10 mg by mouth daily. 11/09/21   [provider]  atorvastatin (LIPITOR) 10 MG tablet Take 10 mg by mouth at bedtime. 07/29/21   [provider]  Fluticasone-Umeclidin-Vilant (TRELEGY ELLIPTA) 100-62.5-25 MCG/ACT AEPB Inhale 1 puff into the lungs daily. 05/24/22   Cobb, Karie Schwalbe, NP  losartan (COZAAR) 100 MG tablet Take 100 mg by mouth daily. 02/01/22   [provider]  metFORMIN (GLUCOPHAGE) 500 MG tablet Take 1 tablet (500 mg total) by mouth 2 (two) times daily with a meal. 07/07/20 01/15/22  [place] Shi, MD  metoprolol  succinate (TOPROL-XL) 25 MG 24 hr tablet Take 25 mg by mouth daily. 12/14/21   [provider]  pantoprazole (PROTONIX) 40 MG tablet Take 40 mg by mouth daily. 07/29/21   [provider]      Allergies    Patient has no known allergies.    Review of Systems   Review of Systems  Physical Exam Updated Vital Signs BP (!) 135/48   Pulse 80   Temp 98.2 F (36.8 C) (Oral)   Resp 17   Ht 5\' 2"  (1.575 m)   Wt 84.4 kg   SpO2 90%   BMI 34.03 kg/m  Physical Exam Vitals and nursing note reviewed.  Constitutional:      General: She is not in acute distress.    Appearance: She is well-developed.  HENT:     Head: Normocephalic and atraumatic.     Nose: Nose normal.     Mouth/Throat:     Mouth: Mucous membranes are moist.     Pharynx: Oropharynx is clear.  Eyes:     Extraocular Movements: Extraocular movements intact.     Conjunctiva/sclera: Conjunctivae normal.     Pupils: Pupils are equal, round, and reactive to light.  Cardiovascular:     Rate and Rhythm: Tachycardia present. Rhythm irregular.     Heart sounds: Normal heart sounds. No murmur heard. Pulmonary:     Effort: No respiratory distress.     Breath sounds: Wheezing (expiratory) present.  Abdominal:  General: Abdomen is flat. There is no distension.     Palpations: Abdomen is soft.     Tenderness: There is no abdominal tenderness.  Musculoskeletal:        General: No swelling.     Cervical back: Neck supple.  Skin:    General: Skin is warm and dry.     Capillary Refill: Capillary refill takes less than 2 seconds.  Neurological:     Mental Status: She is alert and oriented to person, place, and time.  Psychiatric:        Mood and Affect: Mood normal.     ED Results / Procedures / Treatments   Labs (all labs ordered are listed, but only abnormal results are displayed) Labs Reviewed  CBC WITH DIFFERENTIAL/PLATELET - Abnormal; Notable for the following components:      Result Value   RBC  3.35 (*)    Hemoglobin 10.2 (*)    HCT 33.3 (*)    Platelets 115 (*)    All other components within normal limits  COMPREHENSIVE METABOLIC PANEL - Abnormal; Notable for the following components:   Potassium 2.8 (*)    Glucose, Bld 149 (*)    Calcium 6.2 (*)    Total Protein 3.9 (*)    Albumin 1.9 (*)    All other components within normal limits  BRAIN NATRIURETIC PEPTIDE - Abnormal; Notable for the following components:   B Natriuretic Peptide 140.7 (*)    All other components within normal limits  MAGNESIUM - Abnormal; Notable for the following components:   Magnesium 1.1 (*)    All other components within normal limits  I-STAT VENOUS BLOOD GAS, ED - Abnormal; Notable for the following components:   pCO2, Ven 81.7 (*)    Bicarbonate 39.1 (*)    TCO2 42 (*)    Acid-Base Excess 9.0 (*)    All other components within normal limits  TROPONIN I (HIGH SENSITIVITY) - Abnormal; Notable for the following components:   Troponin I (High Sensitivity) 20 (*)    All other components within normal limits  TROPONIN I (HIGH SENSITIVITY) - Abnormal; Notable for the following components:   Troponin I (High Sensitivity) 2,667 (*)    All other components within normal limits  RESP PANEL BY RT-PCR (RSV, FLU A&B, COVID)  [id number]  CBC  HEPARIN LEVEL (UNFRACTIONATED)    EKG EKG Interpretation  Date/Time:  Thursday January 12 2023 16:43:15 EDT Ventricular Rate:  70 PR Interval:  157 QRS Duration: 124 QT Interval:  423 QTC Calculation: 457 R Axis:   112 Text Interpretation: Sinus rhythm Right bundle branch block Repol abnrm suggests ischemia, lateral leads previous ekg is atrial fibrillation Confirmed by Wandra Arthurs [phone]) on 01/12/2023 5:19:23 PM  Radiology CT Angio Chest PE W and/or Wo Contrast  Result Date: 01/12/2023 CLINICAL DATA:  Pulmonary embolus suspected with high probability. Weakness and nausea. EXAM: CT ANGIOGRAPHY CHEST WITH CONTRAST TECHNIQUE: Multidetector CT imaging of the  chest was performed using the standard protocol during bolus administration of intravenous contrast. Multiplanar CT image reconstructions and MIPs were obtained to evaluate the vascular anatomy. RADIATION DOSE REDUCTION: This exam was performed according to the departmental dose-optimization program which includes automated exposure control, adjustment of the mA and/or kV according to patient size and/or use of iterative reconstruction technique. CONTRAST:  71mL OMNIPAQUE IOHEXOL 350 MG/ML SOLN COMPARISON:  09/18/2021 FINDINGS: Cardiovascular: Good opacification of the central and segmental pulmonary arteries representing technically adequate study. No focal filling defects. No evidence  of significant pulmonary embolus. Normal heart size. No pericardial effusions. Calcification of the mitral valve annulus, coronary arteries, and aorta. No aortic aneurysm or dissection. Mediastinum/Nodes: Esophagus is decompressed with minimal esophageal hiatal hernia. Thyroid gland is unremarkable. No significant lymphadenopathy. Lungs/Pleura: Motion artifact limits examination. Mildly spiculated nodule in the superior segment of the left upper lung measuring 1.5 x 1.8 cm diameter. This is enlarged since the previous study, at which time it measured 4 mm diameter. 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 Pulmonary consultation is suggested. No focal consolidation or airspace disease. No pleural effusions. No pneumothorax. Mild atelectasis in the lung bases. Upper Abdomen: Cholelithiasis.  No acute Musculoskeletal: Abnormalities. Degenerative changes in the spine. No destructive bone lesions. Review of the MIP images confirms the above findings. IMPRESSION: 1. No evidence of significant pulmonary embolus. 2. 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 Pulmonary consultation is suggested. 3. Aortic  atherosclerosis. Electronically Signed   By: Lucienne Capers M.D.   On: 01/12/2023 20:24   DG Chest Portable 1 View  Result Date: 01/12/2023 CLINICAL DATA:  Shortness of breath EXAM: PORTABLE CHEST 1 VIEW COMPARISON:  01/17/22 FINDINGS: Atherosclerotic calcification of the aortic arch. Borderline enlargement of the cardiopericardial silhouette. Mild interstitial accentuation, as can commonly be encountered in smokers. No edema or airspace opacity. No blunting of the costophrenic angles. IMPRESSION: 1. Borderline enlargement of the cardiopericardial silhouette, without edema. 2. Mild interstitial accentuation, as can commonly be encountered in smokers. 3.  Aortic Atherosclerosis (ICD10-I70.0). Electronically Signed   By: Van Clines M.D.   On: 01/12/2023 16:26    Medications Ordered in ED Medications  heparin ADULT infusion 100 units/mL (25000 units/245mL) (800 Units/hr Intravenous New Bag/Given 01/12/23 2125)  diltiazem (CARDIZEM) injection 10 mg (10 mg Intravenous Given 01/12/23 1555)  methylPREDNISolone sodium succinate (SOLU-MEDROL) 125 mg/2 mL injection 125 mg (125 mg Intravenous Given 01/12/23 1640)  levalbuterol (XOPENEX) nebulizer solution 1.25 mg (1.25 mg Nebulization Given 01/12/23 1641)  ipratropium (ATROVENT) nebulizer solution 0.5 mg (0.5 mg Nebulization Given 01/12/23 1641)  magnesium sulfate IVPB 2 g 50 mL (0 g Intravenous Stopped 01/12/23 1734)  potassium chloride (KLOR-CON) packet 20 mEq (20 mEq Oral Given 01/12/23 2119)  iohexol (OMNIPAQUE) 350 MG/ML injection 75 mL (75 mLs Intravenous Contrast Given 01/12/23 2012)  heparin bolus via infusion 4,000 Units (4,000 Units Intravenous Bolus from Bag 01/12/23 2121)    ED Course/ Medical Decision Making/ A&P Clinical Course as of 01/12/23 2130  Thu Jan 12, 2023  1719 Unassigned admit [CO]  2017 Imagea nd admit  [CO]    Clinical Course User Index [CO] Bradd Canary, MD                             Medical Decision Making Amount  and/or Complexity of Data Reviewed Labs: ordered. Radiology: ordered.  Risk Prescription drug management. Decision regarding hospitalization.   Patient presented extremely tachycardic with heart rate in the 150s, she was requiring 6 L to maintain saturation in the 90s, blood pressure did remain normotensive.  On exam she has expiratory wheezing and increased work of breathing.  Patient did not appear significantly volume overloaded, but did require 5 L to maintain saturations in the low 90s.  Labs are significant for hypomagnesemia, hypokalemia and hypocalcemia.  Patient had delta troponin that was significantly elevated from initial, likely due to demand ischemia.  Patient was hypercarbic  but compensated and mentating appropriately, this is likely what her baseline is.  She has no obvious leukocytosis, COVID and influenza negative.  EKG does show diffuse ST segment depression particularly when she was in A-fib with RVR.  Concern for possible ischemic etiology.  Chest x-ray did not show any signs of focal consolidation, pleural effusion or pulmonary edema.  CTA was negative for pulmonary embolism, but it did show a spiculated nodule in the left upper lung that could be bronchogenic carcinoma.  Discussed this finding with the patient who was aware that she had a nodule but it has not been further worked up yet.  Patient was given potassium and magnesium.  Patient was converted back to sinus rhythm with 10 of Cardizem.  Due to likely COPD exacerbation being a potential trigger for the atrial fibrillation patient was given Solu-Medrol as well as leave albuterol nebulizer treatment with Atrovent.  Patient was a started on heparin for anticoagulation.  She was admitted to the hospital for further care and observation.  Cardiology came and saw the patient and will continue to follow as patient is admitted to the hospital.          Final Clinical Impression(s) / ED Diagnoses Final diagnoses:   Atrial fibrillation with rapid ventricular response ([place])  Elevated troponin  COPD exacerbation ([facility])     Bradd Canary, MD 01/13/23 0004    Drenda Freeze, MD 01/14/23 [phone]

## 2023-01-12 NOTE — Assessment & Plan Note (Signed)
Replace and recheck 

## 2023-01-12 NOTE — Assessment & Plan Note (Signed)
-  -   Will initiate: Steroid taper  -  Antibiotics rocephin - Albuterol  PRN, - scheduled duoneb,     -  Mucinex.  Titrate O2 to saturation >90%. Follow patients respiratory status.  influenza PCR neg   VBG  - pulmonology consult in AM  -   BiPAP ordered PRN for increased work of breathing.  Currently mentating well no evidence of symptomatic hypercarbia

## 2023-01-12 NOTE — ED Triage Notes (Signed)
Pt coming from home, experiencing weakness and nausea. EMS arrived and found her HR being 180BPM. Alert and oriented, no hx of afib.

## 2023-01-12 NOTE — ED Notes (Signed)
CRITICAL RESULT- 2,667 Deanna Householder, MD made aware.

## 2023-01-12 NOTE — H&P (Addendum)
[name] [id number] DOB: 1945-10-27 DOA: 01/12/2023     PCP: Merryl Hacker, No   Outpatient Specialists:     Pulmonary   Dr.Mannam    Patient arrived to ER on 01/12/23 at 1531 Referred by Attending Drenda Freeze, MD   Patient coming from:    home Lives   With Roomate    Chief Complaint:   Chief Complaint  Patient presents with   Tachycardia    AFIB    HPI: Deanna Petty is a 77 y.o. female with medical history significant of Dm2, COPD on 3L,   need for trilogy vent at home   Presented with   cough worsening shortness of breath Patient coming in from home secondary to nausea and weakness heart rates in the 180s has no prior history of atrial fibrillation Placed on nonrebreather satting in the 70s emergency department She has COPD and at baseline wears 3 L of O2 has been having worsening shortness of breath has had diarrhea today and 1 emesis.  She tried to increase her oxygen via shortness of breath did not improve.  Initially denied any chest pain    Reports cough occasionally coughing phlegm Denies any CP Has been getting more sleepy at home  Reports no weight loss   No bleeding problems  No fever no chills  Denies significant ETOH intake    Smokes 3 cig/day but interested in quitting   Lab Results  Component Value Date   SARSCOV2NAA NEGATIVE 01/12/2023   [place] NEGATIVE 01/15/2022   [place] NEGATIVE 09/18/2021   [place] NEGATIVE 07/03/2020        Regarding pertinent Chronic problems:     Hyperlipidemia -  on statins Lipitor (atorvastatin)  Lipid Panel     Component Value Date/Time   CHOL 110 09/20/2021 0509   TRIG 55 09/20/2021 0509   HDL 44 09/20/2021 0509   CHOLHDL 2.5 09/20/2021 0509   VLDL 11 09/20/2021 0509   LDLCALC 55 09/20/2021 0509     HTN on NOrvasc, Cozaar   last echo April 23 EF >75%  Recent Results (from the past 43800 hour(s))  ECHOCARDIOGRAM COMPLETE   Collection Time: 01/16/22  2:31 PM  Result Value    Weight 3,118.19   Height 62   BP 108/51   S' Lateral 2.50   AR max vel 1.61   AV Area VTI 1.21   AV Mean grad 17.0   AV Peak grad 28.5   Ao pk vel 2.67   Area-P 1/2 3.53   AV Area mean vel 1.33   Narrative      ECHOCARDIOGRAM REPORT       Patient Name:   Deanna Petty Date of Exam: 01/16/2022 Medical Rec #:  [id number]     Height:       62.0 in Accession #:    [id number]    Weight:       194.9 lb Date of Birth:  [birth year]    BSA:          1.891 m Patient Age:    75 years      BP:           108/51 mmHg Patient Gender: F             HR:           89 bpm. Exam Location:  Inpatient  Procedure: 2D Echo, Cardiac Doppler, Color Doppler and Intracardiac  Opacification Agent  Indications:    Atrial Fibrillation   History:        Patient has prior history of Echocardiogram examinations, most                 recent 09/19/2021. CHF, COPD; Risk Factors:Diabetes.   Sonographer:    Merrie Roof RDCS Referring Phys: [place]    1. Left ventricular ejection fraction, by estimation, is >75%. The left ventricle has hyperdynamic function. The left ventricle has no regional wall motion abnormalities. There is mild left ventricular hypertrophy. Left ventricular diastolic parameters  are indeterminate.  2. Right ventricular systolic function is normal. The right ventricular size is normal.  3. The mitral valve is normal in structure. Trivial mitral valve regurgitation.  4. AV is thickened, calcified with restricted motion. Difficult to see well.. Peak and mean gradients through the valve 29 and 17 mm Hg respectively AVA is 1.21 cm 2 Dimensionless index is 0.39. Overall consistent with mild to moderate AS. Compared to  echo from 12.4.22, mean gradient is increased (10 to 17 mm Hg). Aortic valve regurgitation is not visualized.  5. The inferior vena cava is normal in size with greater than 50% respiratory variability, suggesting right atrial pressure of 3  mmHg.  FINDINGS  Left Ventricle: Left ventricular ejection fraction, by estimation, is >75%. The left ventricle has hyperdynamic function. The left ventricle has no regional wall motion abnormalities. Definity contrast agent was given IV to delineate the left  ventricular endocardial borders. The left ventricular internal cavity size was normal in size. There is mild left ventricular hypertrophy. Left ventricular diastolic parameters are indeterminate.  Right Ventricle: The right ventricular size is normal. Right vetricular wall thickness was not assessed. Right ventricular systolic function is normal.  Left Atrium: Left atrial size was normal in size.  Right Atrium: Right atrial size was normal in size.  Pericardium: There is no evidence of pericardial effusion.  Mitral Valve: The mitral valve is normal in structure. Trivial mitral valve regurgitation.  Tricuspid Valve: The tricuspid valve is normal in structure. Tricuspid valve regurgitation is trivial.  Aortic Valve: AV is thickened, calcified with restricted motion. Difficult to see well.. Peak and mean gradients through the valve 29 and 17 mm Hg respectively AVA is 1.21 cm 2 Dimensionless index is 0.39. Overall consistent with mild to moderate AS.  Compared to echo from 12.4.22, mean gradient is increased (10 to 17 mm Hg). Aortic valve regurgitation is not visualized. Aortic valve mean gradient measures 17.0 mmHg. Aortic valve peak gradient measures 28.5 mmHg. Aortic valve area, by VTI measures  1.21 cm.  Pulmonic Valve: The pulmonic valve was not well visualized.  Aorta: The aortic root is normal in size and structure.  Venous: The inferior vena cava is normal in size with greater than 50% respiratory variability, suggesting right atrial pressure of 3 mmHg.  IAS/Shunts: The interatrial septum was not assessed.    LEFT VENTRICLE PLAX 2D LVIDd:         4.20 cm   Diastology LVIDs:         2.50 cm   LV e' medial:    6.09 cm/s LV  PW:         1.10 cm   LV E/e' medial:  21.3 LV IVS:        1.20 cm   LV e' lateral:   6.96 cm/s LVOT diam:     2.00 cm   LV E/e' lateral: 18.7 LV  SV:         69 LV SV Index:   37 LVOT Area:     3.14 cm    RIGHT VENTRICLE RV Basal diam:  3.00 cm RV S prime:     11.60 cm/s TAPSE (M-mode): 1.4 cm  LEFT ATRIUM             Index        RIGHT ATRIUM           Index LA diam:        4.80 cm 2.54 cm/m   RA Area:     12.00 cm LA Vol (A2C):   44.4 ml 23.48 ml/m  RA Volume:   25.00 ml  13.22 ml/m LA Vol (A4C):   38.3 ml 20.26 ml/m LA Biplane Vol: 41.1 ml 21.74 ml/m  AORTIC VALVE AV Area (Vmax):    1.61 cm AV Area (Vmean):   1.33 cm AV Area (VTI):     1.21 cm AV Vmax:           267.00 cm/s AV Vmean:          201.000 cm/s AV VTI:            0.570 m AV Peak Grad:      28.5 mmHg AV Mean Grad:      17.0 mmHg LVOT Vmax:         137.00 cm/s LVOT Vmean:        84.900 cm/s LVOT VTI:          0.220 m LVOT/AV VTI ratio: 0.39   AORTA Ao Root diam: 2.90 cm  MITRAL VALVE MV Area (PHT): 3.53 cm     SHUNTS MV Decel Time: 215 msec     Systemic VTI:  0.22 m MV E velocity: 130.00 cm/s  Systemic Diam: 2.00 cm MV A velocity: 147.00 cm/s MV E/A ratio:  0.88  Dorris Carnes MD Electronically signed by Dorris Carnes MD Signature Date/Time: 01/16/2022/6:04:39 PM       Final     *Note: Due to a large number of results and/or encounters for the requested time period, some results have not been displayed. A complete set of results can be found in Results Review.        DM 2 -  Lab Results  Component Value Date   HGBA1C 7.1 (H) 01/15/2022   PO meds only,     obesity-   BMI Readings from Last 1 Encounters:  01/12/23 34.03 kg/m       COPD -  followed by pulmonology    on baseline oxygen  3L,   Trilegy    OSA - noncompliant with CPAP/  vent     While in ER: Clinical Course as of 01/12/23 2200  Thu Jan 12, 2023  1719 Unassigned admit [CO]  2017 Imagea nd admit  [CO]    Clinical  Course User Index [CO] Bradd Canary, MD     Converted to sinus after diltiazem 10 mg now in sinus But noted elevated troponin in 2000 cardiology was called  Noted to be wheezing CTA neg for PE but did show increasing spiculated mass     Lab Orders         Resp panel by RT-PCR (RSV, Flu A&B, Covid) Anterior Nasal Swab         CBC with Differential         Comprehensive metabolic panel         Brain natriuretic peptide  Magnesium         CBC         Heparin level (unfractionated)         Heparin level (unfractionated)         I-Stat venous blood gas, ([facility], [facility], [facility])      CXR - Borderline enlargement of the cardiopericardial silhouette, without edema.   CTA chest -  no PE no evidence of infiltrate Mildly spiculated nodule in the superior segment of the left upper lung measuring 1.5 x 1.8 cm diameter. This is enlarged since the previous study, at which time it measured 4 mm diameter. 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   Following Medications were ordered in ER: Medications  heparin ADULT infusion 100 units/mL (25000 units/239mL) (800 Units/hr Intravenous New Bag/Given 01/12/23 2125)  diltiazem (CARDIZEM) injection 10 mg (10 mg Intravenous Given 01/12/23 1555)  methylPREDNISolone sodium succinate (SOLU-MEDROL) 125 mg/2 mL injection 125 mg (125 mg Intravenous Given 01/12/23 1640)  levalbuterol (XOPENEX) nebulizer solution 1.25 mg (1.25 mg Nebulization Given 01/12/23 1641)  ipratropium (ATROVENT) nebulizer solution 0.5 mg (0.5 mg Nebulization Given 01/12/23 1641)  magnesium sulfate IVPB 2 g 50 mL (0 g Intravenous Stopped 01/12/23 1734)  potassium chloride (KLOR-CON) packet 20 mEq (20 mEq Oral Given 01/12/23 2119)  iohexol (OMNIPAQUE) 350 MG/ML injection 75 mL (75 mLs Intravenous Contrast Given 01/12/23 2012)  heparin bolus via infusion 4,000 Units (4,000 Units Intravenous Bolus from Bag  01/12/23 2121)    _______________________________________________________ ER Provider Called:  Cardiology    Dr. Nikki Dom They Recommend admit to medicine    SEEN in ER Rec treat COPD continue heparin drip start metoprolol 12.5 twice a day correct electrolytes Suspect type II myocardial infarction   ED Triage Vitals  Enc Vitals Group     BP 01/12/23 1545 (!) 115/58     Pulse Rate 01/12/23 1545 89     Resp 01/12/23 1545 (!) 34     Temp 01/12/23 1850 98.2 F (36.8 C)     Temp Source 01/12/23 1850 Oral     SpO2 01/12/23 1545 (!) 89 %     Weight 01/12/23 2100 186 lb 1.1 oz (84.4 kg)     Height 01/12/23 2100 5\' 2"  (1.575 m)     Head Circumference --      Peak Flow --      Pain Score --      Pain Loc --      Pain Edu? --      Excl. in [place]? --   TMAX(24)@     _________________________________________ Significant initial  Findings: Abnormal Labs Reviewed  CBC WITH DIFFERENTIAL/PLATELET - Abnormal; Notable for the following components:      Result Value   RBC 3.35 (*)    Hemoglobin 10.2 (*)    HCT 33.3 (*)    Platelets 115 (*)    All other components within normal limits  COMPREHENSIVE METABOLIC PANEL - Abnormal; Notable for the following components:   Potassium 2.8 (*)    Glucose, Bld 149 (*)    Calcium 6.2 (*)    Total Protein 3.9 (*)    Albumin 1.9 (*)    All other components within normal limits  BRAIN NATRIURETIC PEPTIDE - Abnormal; Notable for the following components:   B Natriuretic Peptide 140.7 (*)    All other components within normal limits  MAGNESIUM - Abnormal; Notable for the following components:   Magnesium 1.1 (*)    All  other components within normal limits  I-STAT VENOUS BLOOD GAS, ED - Abnormal; Notable for the following components:   pCO2, Ven 81.7 (*)    Bicarbonate 39.1 (*)    TCO2 42 (*)    Acid-Base Excess 9.0 (*)    All other components within normal limits  TROPONIN I (HIGH SENSITIVITY) - Abnormal; Notable for the following components:    Troponin I (High Sensitivity) 20 (*)    All other components within normal limits  TROPONIN I (HIGH SENSITIVITY) - Abnormal; Notable for the following components:   Troponin I (High Sensitivity) 2,667 (*)    All other components within normal limits      _________________________ Troponin   Cardiac Panel (last 3 results) Recent Labs    01/12/23 1545 01/12/23 1940  TROPONINIHS 20* 2,667*     ECG: Ordered Personally reviewed and interpreted by me showing: HR : 77 Rhythm:Sinus rhythm IRBBB and LPFB Abnormal T, consider ischemia, lateral leads QTC 454  BNP (last 3 results) Recent Labs    01/15/22 1721 01/17/22 0247 01/12/23 1545  BNP 380.1* 320.9* 140.7*     COVID-19 Labs   Lab Results  Component Value Date   SARSCOV2NAA NEGATIVE 01/12/2023   [place] NEGATIVE 01/15/2022   [place] NEGATIVE 09/18/2021   [place] NEGATIVE 07/03/2020    The recent clinical data is shown below. Vitals:   01/12/23 1930 01/12/23 2030 01/12/23 2045 01/12/23 2100  BP: (!) 144/78 (!) 142/52 (!) 136/50 (!) 135/48  Pulse: 71 71 76 80  Resp: (!) 23 (!) 22 (!) 26 17  Temp:      TempSrc:      SpO2: 92% 100% 96% 90%  Weight:    84.4 kg  Height:    5\' 2"  (1.575 m)    WBC     Component Value Date/Time   WBC 7.0 01/12/2023 1545   LYMPHSABS 1.5 01/12/2023 1545   MONOABS 0.4 01/12/2023 1545   EOSABS 0.1 01/12/2023 1545   BASOSABS 0.0 01/12/2023 1545     Lactic Acid, Venous    Component Value Date/Time   LATICACIDVEN 1.9 01/15/2022 2010      Lactic Acid, Venous    Component Value Date/Time   LATICACIDVEN 1.9 01/15/2022 2010      UA  ordered    Results for orders placed or performed during the hospital encounter of 01/12/23  Resp panel by RT-PCR (RSV, Flu A&B, Covid) Anterior Nasal Swab     Status: None   Collection Time: 01/12/23  3:40 PM   Specimen: Anterior Nasal Swab  Result Value Ref Range Status   SARS Coronavirus 2 by RT PCR NEGATIVE NEGATIVE Final    Influenza A by PCR NEGATIVE NEGATIVE Final   Influenza B by PCR NEGATIVE NEGATIVE Final         Resp Syncytial Virus by PCR NEGATIVE NEGATIVE Final             _____ Venous  Blood Gas result:  pH  7.288 Sodium 141 mmol/L   pCO2, Ven 81.7 High Panic  mmHg Potassium 4.4 mmol/L  pO2, Ven 45 mmHg        __________________________________________________________ Recent Labs  Lab 01/12/23 1545 01/12/23 1625 01/12/23 2300  NA 144 141 137  K 2.8* 4.4 5.3*  CO2 25  --   --   GLUCOSE 149*  --   --   BUN 11  --   --   CREATININE 0.61  --   --   CALCIUM 6.2*  --   --  MG 1.1*  --   --     Cr  down from baseline see below Lab Results  Component Value Date   CREATININE 0.61 01/12/2023   CREATININE 0.93 01/20/2022   CREATININE 0.92 01/19/2022    Recent Labs  Lab 01/12/23 1545  AST 18  ALT 18  ALKPHOS 46  BILITOT 0.4  PROT 3.9*  ALBUMIN 1.9*   Lab Results  Component Value Date   CALCIUM 6.2 (LL) 01/12/2023   PHOS 4.3 01/16/2022    Plt: Lab Results  Component Value Date   PLT 115 (L) 01/12/2023    Recent Labs  Lab 01/12/23 1545 01/12/23 1625 01/12/23 2300  WBC 7.0  --   --   NEUTROABS 4.9  --   --   HGB 10.2* 13.3 14.6  HCT 33.3* 39.0 43.0  MCV 99.4  --   --   PLT 115*  --   --     HG/HCT   Down   from baseline see below    Component Value Date/Time   HGB 13.3 01/12/2023 1625   HCT 39.0 01/12/2023 1625   MCV 99.4 01/12/2023 1545    _______________________________________________ Hospitalist was called for admission for   Atrial fibrillation with rapid ventricular response    Elevated troponin    COPD exacerbation (HCC)    The following Work up has been ordered so far:  Orders Placed This Encounter  Procedures   Resp panel by RT-PCR (RSV, Flu A&B, Covid) Anterior Nasal Swab   DG Chest Portable 1 View   CT Angio Chest PE W and/or Wo Contrast   CBC with Differential   Comprehensive metabolic panel   Brain natriuretic peptide   Magnesium    CBC   Heparin level (unfractionated)   Heparin level (unfractionated)   Consult for [facility] Admission   heparin per pharmacy consult   Inpatient consult to Cardiology   I-Stat venous blood gas, ([facility], [facility], [facility])   ED EKG   EKG 12-Lead   EKG 12-Lead   EKG 12-Lead     OTHER Significant initial  Findings:  labs showing:     DM  labs:  HbA1C: Recent Labs    01/15/22 1959  HGBA1C 7.1*       CBG (last 3)  No results for input(s): "GLUCAP" in the last 72 hours.        Cultures:    Component Value Date/Time   SDES URINE, CLEAN CATCH 01/16/2022 1050   SPECREQUEST  01/16/2022 1050    NONE Performed at [facility], [place] [address], [place], [place] [address]    CULT MULTIPLE SPECIES PRESENT, SUGGEST RECOLLECTION (A) 01/16/2022 1050   REPTSTATUS 01/17/2022 FINAL 01/16/2022 1050     Radiological Exams on Admission: CT Angio Chest PE W and/or Wo Contrast  Result Date: 01/12/2023 CLINICAL DATA:  Pulmonary embolus suspected with high probability. Weakness and nausea. EXAM: CT ANGIOGRAPHY CHEST WITH CONTRAST TECHNIQUE: Multidetector CT imaging of the chest was performed using the standard protocol during bolus administration of intravenous contrast. Multiplanar CT image reconstructions and MIPs were obtained to evaluate the vascular anatomy. RADIATION DOSE REDUCTION: This exam was performed according to the departmental dose-optimization program which includes automated exposure control, adjustment of the mA and/or kV according to patient size and/or use of iterative reconstruction technique. CONTRAST:  36mL OMNIPAQUE IOHEXOL 350 MG/ML SOLN COMPARISON:  09/18/2021 FINDINGS: Cardiovascular: Good opacification of the central and segmental pulmonary arteries representing technically adequate study. No focal filling  defects. No evidence of significant pulmonary embolus. Normal heart size. No pericardial effusions. Calcification of the mitral valve annulus, coronary arteries,  and aorta. No aortic aneurysm or dissection. Mediastinum/Nodes: Esophagus is decompressed with minimal esophageal hiatal hernia. Thyroid gland is unremarkable. No significant lymphadenopathy. Lungs/Pleura: Motion artifact limits examination. Mildly spiculated nodule in the superior segment of the left upper lung measuring 1.5 x 1.8 cm diameter. This is enlarged since the previous study, at which time it measured 4 mm diameter. 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 Pulmonary consultation is suggested. No focal consolidation or airspace disease. No pleural effusions. No pneumothorax. Mild atelectasis in the lung bases. Upper Abdomen: Cholelithiasis.  No acute Musculoskeletal: Abnormalities. Degenerative changes in the spine. No destructive bone lesions. Review of the MIP images confirms the above findings. IMPRESSION: 1. No evidence of significant pulmonary embolus. 2. 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 Pulmonary consultation is suggested. 3. Aortic atherosclerosis. Electronically Signed   By: Lucienne Capers M.D.   On: 01/12/2023 20:24   DG Chest Portable 1 View  Result Date: 01/12/2023 CLINICAL DATA:  Shortness of breath EXAM: PORTABLE CHEST 1 VIEW COMPARISON:  01/17/22 FINDINGS: Atherosclerotic calcification of the aortic arch. Borderline enlargement of the cardiopericardial silhouette. Mild interstitial accentuation, as can commonly be encountered in smokers. No edema or airspace opacity. No blunting of the costophrenic angles. IMPRESSION: 1. Borderline enlargement of the cardiopericardial silhouette, without edema. 2. Mild interstitial accentuation, as can commonly be encountered in smokers. 3.  Aortic Atherosclerosis (ICD10-I70.0). Electronically Signed   By: Van Clines M.D.   On: 01/12/2023 16:26    _______________________________________________________________________________________________________ Latest  Blood pressure (!) 135/48, pulse 80, temperature 98.2 F (36.8 C), temperature source Oral, resp. rate 17, height 5\' 2"  (1.575 m), weight 84.4 kg, SpO2 90 %.   Vitals  labs and radiology finding personally reviewed  Review of Systems:    Pertinent positives include:    fatigue,  shortness of breath at rest.  dyspnea on exertion,  wheezing. Constitutional:  No weight loss, night sweats, Fevers, chills,weight loss  HEENT:  No headaches, Difficulty swallowing,Tooth/dental problems,Sore throat,  No sneezing, itching, ear ache, nasal congestion, post nasal drip,  Cardio-vascular:  No chest pain, Orthopnea, PND, anasarca, dizziness, palpitations.no Bilateral lower extremity swelling  GI:  No heartburn, indigestion, abdominal pain, nausea, vomiting, diarrhea, change in bowel habits, loss of appetite, melena, blood in stool, hematemesis Resp:   No excess mucus, no productive cough, No non-productive cough, No coughing up of blood.No change in color of mucus.No Skin:  no rash or lesions. No jaundice GU:  no dysuria, change in color of urine, no urgency or frequency. No straining to urinate.  No flank pain.  Musculoskeletal:  No joint pain or no joint swelling. No decreased range of motion. No back pain.  Psych:  No change in mood or affect. No depression or anxiety. No memory loss.  Neuro: no localizing neurological complaints, no tingling, no weakness, no double vision, no gait abnormality, no slurred speech, no confusion  All systems reviewed and apart from [place] all are negative _______________________________________________________________________________________________ Past Medical History:   Past Medical History:  Diagnosis Date   Bronchitis    Cigarette smoker    COPD (chronic obstructive pulmonary disease) (HCC)    Pneumonia    Shortness of breath    with  exertion; lasted used inhaler 06/02/13      Past Surgical History:  Procedure  Laterality Date   ABDOMINAL HYSTERECTOMY     BREAST LUMPECTOMY WITH NEEDLE LOCALIZATION Left 06/12/2013   Procedure: LEFT BREAST LUMPECTOMY WITH NEEDLE LOCALIZATION;  Surgeon: Imogene Burn. Georgette Dover, MD;  Location: [place] [facility];  Service: General;  Laterality: Left;   BREAST SURGERY     HIP FRACTURE SURGERY Right     Social History:  Ambulatory  cane      reports that she has been smoking cigarettes. She has a 10.00 pack-year smoking history. She has never used smokeless tobacco. She reports current alcohol use. She reports that she does not use drugs.     Family History:  Family History  Problem Relation Age of Onset   Cancer Mother        oral, brain   Diabetes Father    ______________________________________________________________________________________________ Allergies: No Known Allergies   Prior to Admission medications   Medication Sig Start Date End Date Taking? Authorizing Provider  Acetaminophen 500 MG capsule Take 500 mg by mouth every 6 (six) hours as needed for fever or pain.    [provider]  albuterol (PROVENTIL) (2.5 MG/3ML) 0.083% nebulizer solution Use 1 vial (2.5 mg total) by nebulization every 6 (six) hours as needed for shortness of breath or wheezing. 01/20/22 02/19/22  Arrien, Jimmy Picket, MD  albuterol (VENTOLIN HFA) 108 (90 Base) MCG/ACT inhaler Inhale 2 puffs into the lungs every 4 (four) hours as needed for wheezing or shortness of breath. 01/20/22   Arrien, Jimmy Picket, MD  amLODipine (NORVASC) 10 MG tablet Take 10 mg by mouth daily. 11/09/21   [provider]  atorvastatin (LIPITOR) 10 MG tablet Take 10 mg by mouth at bedtime. 07/29/21   [provider]  Fluticasone-Umeclidin-Vilant (TRELEGY ELLIPTA) 100-62.5-25 MCG/ACT AEPB Inhale 1 puff into the lungs daily. 05/24/22   Cobb, Karie Schwalbe, NP  losartan (COZAAR) 100 MG tablet Take 100 mg by mouth daily.  02/01/22   [provider]  metFORMIN (GLUCOPHAGE) 500 MG tablet Take 1 tablet (500 mg total) by mouth 2 (two) times daily with a meal. 07/07/20 01/15/22  [place] Shi, MD  metoprolol succinate (TOPROL-XL) 25 MG 24 hr tablet Take 25 mg by mouth daily. 12/14/21   [provider]  pantoprazole (PROTONIX) 40 MG tablet Take 40 mg by mouth daily. 07/29/21   [provider]    ___________________________________________________________________________________________________ Physical Exam:    01/12/2023    9:00 PM 01/12/2023    8:45 PM 01/12/2023    8:30 PM  Vitals with BMI  Height 5\' 2"     Weight 186 lbs 1 oz    BMI [id number]    Systolic [id number]  Diastolic 48 50 52  Pulse 80 76 71     1. General:  in No  Acute distress   Chronically ill  -appearing 2. Psychological: Alert and  Oriented 3. Head/ENT:    Dry Mucous Membranes                          Head Non traumatic, neck supple                           Poor Dentition 4. SKIN:     decreased Skin turgor,  Skin clean Dry and intact no rash    5. Heart: Regular rate and rhythm systolic Murmur, no Rub or gallop 6. Lungs:  minimal wheezes or crackles   7. Abdomen: Soft,  non-tender, Non distended  bowel sounds present 8. Lower extremities: no clubbing, cyanosis, no  edema 9. Neurologically Grossly intact, moving all 4 extremities equally   10. MSK: Normal range of motion    Chart has been reviewed  ______________________________________________________________________________________________  Assessment/Plan 77 y.o. female with medical history significant of Dm2, COPD on 3L,   need for trilogy vent at home    Admitted for   Atrial fibrillation with rapid ventricular response (HCC)    Elevated troponin   COPD exacerbation     Present on Admission:  COPD with acute exacerbation ([place])  Atrial fibrillation ([place])  Type 2 MI (myocardial infarction) ([place])  Acute on chronic respiratory failure with  hypoxia ([place])  Essential hypertension  Hypomagnesemia  Tobacco use  Type 2 diabetes mellitus with hyperlipidemia (HCC)  Elevated troponin  Hypokalemia     Atrial fibrillation ([place]) New onset  A. Fib -  - CHA2DS2 vas score 7 On heparin will need long term anticoagulation COPD exacerbation will try Zebeta for today Now rate controlled     COPD with acute exacerbation ([place])  -  - Will initiate: Steroid taper  -  Antibiotics rocephin - Albuterol  PRN, - scheduled duoneb,     -  Mucinex.  Titrate O2 to saturation >90%. Follow patients respiratory status.  influenza PCR neg   VBG  - pulmonology consult in AM  -   BiPAP ordered PRN for increased work of breathing.  Currently mentating well no evidence of symptomatic hypercarbia   Acute on chronic respiratory failure with hypoxia (HCC)  this patient has acute respiratory failure with Hypoxia and  Hypercarbia as documented by the presence of following: O2 saturatio< 90% on RA  Likely due to:   COPD exacerbation,   Provide O2 therapy and titrate as needed  Continuous pulse ox   check Pulse ox with ambulation prior to discharge   may need  TC consult for home O2 set up    flutter valve ordered   Essential hypertension Zebeta 5mg  po q day for now and monitor BP  Hypomagnesemia Replace and recheck  Tobacco use  - Spoke about importance of quitting spent 5 minutes discussing options for treatment, prior attempts at quitting, and dangers of smoking  -At this point patient is   interested in quitting  - refused nicotine patch ( causes rash)  - nursing tobacco cessation protocol   Type 2 diabetes mellitus with hyperlipidemia ([place])  - Order Sensitive   SSI    -  check TSH and HgA1C  - Hold by mouth medications    Elevated troponin Continue to cycle cardiac enzymes Cardiac Panel (last 3 results) Recent Labs    01/12/23 1545 01/12/23 1940 01/12/23 2258  CKTOTAL  --   --  256*  TROPONINIHS 20* 2,667* 5,742*    Keep NPo for possible cath Aspirin 365 mg po Discussed with cardiology  No CP  No PE  Obtain echogram Appreciate cardiology consult  Possible demand ischemia vs silent NStEMI patient currently chest pain-free  Hypokalemia Doubt was true, I suspect labs were diluted will repeat and follow Hold off on further replacement    Other plan as per orders.  DVT prophylaxis: heparin    Code Status:    Code Status: DNR DNR/DNI   as per patient    I had personally discussed CODE STATUS with patient Patient states her family may be disagreeble but she feels strongly about DNR/DNI ACP none   Family Communication:   Family not at  Bedside  Diet heart healthy   Disposition Plan:    To home once workup is complete and patient is stable   Following barriers for discharge:                            Electrolytes corrected                                                          Will need consultants to evaluate patient prior to discharge                       Consults called: Cardiology is awre  Sent msg to pulmonology  to see in AM   Admission status:  ED Disposition     ED Disposition  [place]: [facility] [[id number]]  Level of Care: Progressive [102]  Admit to Progressive based on following criteria: CARDIOVASCULAR & THORACIC of moderate stability with acute coronary syndrome symptoms/low risk myocardial infarction/hypertensive urgency/arrhythmias/heart failure potentially compromising stability and stable post cardiovascular intervention patients.  Admit to Progressive based on following criteria: MULTISYSTEM THREATS such as stable sepsis, metabolic/electrolyte imbalance with or without encephalopathy that is responding to early treatment.  May admit patient to [facility] or [facility] if equivalent level of care is available:: No  Covid Evaluation: Confirmed COVID Negative  Diagnosis: COPD with acute exacerbation  [id number]  Admitting Physician: Toy Baker [id number]  Attending Physician: Toy Baker [id number]  Certification:: I certify this patient will need inpatient services for at least 2 midnights  Estimated Length of Stay: 2            inpatient     I Expect 2 midnight stay secondary to severity of patient's current illness need for inpatient interventions justified by the following:  hemodynamic instability despite optimal treatment ( hypoxia, hypercapnia)  Severe lab/radiological/exam abnormalities including:    COPD exacerbation troponin elevation  and extensive comorbidities including:   COPD/asthma   That are currently affecting medical management.   I expect  patient to be hospitalized for 2 midnights requiring inpatient medical care.  Patient is at high risk for adverse outcome (such as loss of life or disability) if not treated.  Indication for inpatient stay as follows:    Hemodynamic instability despite maximal medical therapy,    New or worsening hypoxia   Need for IV antibiotics   need for biPAP    Level of care         progressive tele indefinitely please discontinue once patient no longer qualifies COVID-19 Labs   Lab Results  Component Value Date   [place] 01/12/2023     Precautions: admitted as   Covid Negative      Rosselyn Martha 01/12/2023, 11:58 PM    [facility]     after 2 AM please page floor coverage PA If 7AM-7PM, please contact the day team taking care of the patient using [email or web address]

## 2023-01-12 NOTE — Assessment & Plan Note (Signed)
Zebeta 5mg  po q day for now and monitor BP

## 2023-01-12 NOTE — Assessment & Plan Note (Addendum)
-   Spoke about importance of quitting spent 5 minutes discussing options for treatment, prior attempts at quitting, and dangers of smoking  -At this point patient is   interested in quitting  - refused nicotine patch ( causes rash)  - nursing tobacco cessation protocol

## 2023-01-12 NOTE — Consult Note (Addendum)
Cardiology Consultation   Patient ID: Deanna Petty MRN: [id number]; DOB: [birth year]  Admit date: 01/12/2023 Date of Consult: 01/12/2023  PCP:  Deanna Petty, No   [facility] Providers Cardiologist:  None    Has seen Sherren Mocha, MD previously Click here to update MD or APP on Care Team, Refresh:1}     Patient Profile:   Deanna Petty is a 77 y.o. female with a hx of hypertension, hyperlipidemia, type 2 diabetes, PAD, coronary calcifications, chronic hypoxic respiratory failure secondary to COPD on home O2, ongoing tobacco use who is being seen 01/12/2023 for the evaluation of atrial fibrillation with rapid ventricular response at the request of Dr. Darl Householder.  History of Present Illness:   [name] states that she was in her usual state of health until earlier today when she notes that she started to feel generally unwell.  She is unable to further elaborate on her symptoms though does deny any chest pain, acute onset dyspnea, palpitations, dizziness lightheadedness, presyncope.  Denies new cough, dyspnea or sputum production.  Denies new sick contacts.  She does state that her symptoms continue to worsen to the point where she became nauseous and had an episode of NBNB emesis.  She asked her son to call EMS.  On arrival to the emergency room, she was noted to be afebrile, tachycardic with heart rates in the 150s, BP 90-110/50-80, oxygen saturations in the upper 80s and respiratory rate in the 20s-30s.  BMP significant for K 2.8, magnesium 1.1, normal LFTs.  VBG 7.28/82.  Initial hsTnT 20 with repeat 2,667.  CTA chest with no PE though incidentally noted mitral annular calcification, aortic calcifications and calcifications of the coronary arteries.  No aortic dissection or aneurysm.  Also noted was a mildly spiculated nodule in the superior segment of the left upper lung measuring 1.5 x 1.8 cm which has significantly enlarged from last imaged.  No focal consolidation, pleural effusions or  pneumothorax.  EKG on arrival with atrial fibrillation with rapid ventricular response with T wave inversions and ST depressions.  She received 10 mg of IV diltiazem resulting in cardioversion to normal sinus rhythm.  She was also started on IV heparin and given 125 mg of IV Solu-Medrol for COPD exacerbation.  She has previously seen Dr. Sherren Mocha, MD in cardiology clinic in 2014 given abnormal EKG prior to surgical intervention.  Stress echo was recommended at that time and though does not appear that this was performed.  She states that she lives a sedentary life with very little ambulation.  In this context, denies recent history of chest pain at rest or on exertion.  No new DOE, orthopnea, PND.  Stable lower extremity edema recently.  Denies a known history of arrhythmias.  Continues to smoke 3 cigarettes/day.  Reports alcohol use in the past but quit multiple years ago.   Past Medical History:  Diagnosis Date   Bronchitis    Cigarette smoker    COPD (chronic obstructive pulmonary disease) (HCC)    Pneumonia    Shortness of breath    with exertion; lasted used inhaler 06/02/13    Past Surgical History:  Procedure Laterality Date   ABDOMINAL HYSTERECTOMY     BREAST LUMPECTOMY WITH NEEDLE LOCALIZATION Left 06/12/2013   Procedure: LEFT BREAST LUMPECTOMY WITH NEEDLE LOCALIZATION;  Surgeon: Imogene Burn. Georgette Dover, MD;  Location: [facility];  Service: General;  Laterality: Left;   BREAST SURGERY     HIP FRACTURE SURGERY Right  Home Medications:  Prior to Admission medications   Medication Sig Start Date End Date Taking? Authorizing Provider  Acetaminophen 500 MG capsule Take 500 mg by mouth every 6 (six) hours as needed for fever or pain.    [provider]  albuterol (PROVENTIL) (2.5 MG/3ML) 0.083% nebulizer solution Use 1 vial (2.5 mg total) by nebulization every 6 (six) hours as needed for shortness of breath or wheezing. 01/20/22 02/19/22  Arrien, Jimmy Picket, MD  albuterol  (VENTOLIN HFA) 108 (90 Base) MCG/ACT inhaler Inhale 2 puffs into the lungs every 4 (four) hours as needed for wheezing or shortness of breath. 01/20/22   Arrien, Jimmy Picket, MD  amLODipine (NORVASC) 10 MG tablet Take 10 mg by mouth daily. 11/09/21   [provider]  atorvastatin (LIPITOR) 10 MG tablet Take 10 mg by mouth at bedtime. 07/29/21   [provider]  Fluticasone-Umeclidin-Vilant (TRELEGY ELLIPTA) 100-62.5-25 MCG/ACT AEPB Inhale 1 puff into the lungs daily. 05/24/22   Cobb, Karie Schwalbe, NP  losartan (COZAAR) 100 MG tablet Take 100 mg by mouth daily. 02/01/22   [provider]  metFORMIN (GLUCOPHAGE) 500 MG tablet Take 1 tablet (500 mg total) by mouth 2 (two) times daily with a meal. 07/07/20 01/15/22  Guilford Shi, MD  metoprolol succinate (TOPROL-XL) 25 MG 24 hr tablet Take 25 mg by mouth daily. 12/14/21   [provider]  pantoprazole (PROTONIX) 40 MG tablet Take 40 mg by mouth daily. 07/29/21   [provider]    Inpatient Medications: Scheduled Meds:  Continuous Infusions:  heparin 800 Units/hr (01/12/23 2125)   PRN Meds:   Allergies:   No Known Allergies  Social History:   Social History   Socioeconomic History   Marital status: Single    Spouse name: Not on file   Number of children: Not on file   Years of education: Not on file   Highest education level: Not on file  Occupational History   Not on file  Tobacco Use   Smoking status: Some Days    Packs/day: 0.25    Years: 40.00    Additional pack years: 0.00    Total pack years: 10.00    Types: Cigarettes   Smokeless tobacco: Never   Tobacco comments:    Smokes 4/day 10/26/22  Vaping Use   Vaping Use: Never used  Substance and Sexual Activity   Alcohol use: Yes    Comment: rare   Drug use: No   Sexual activity: Not on file  Other Topics Concern   Not on file  Social History Narrative   Not on file   Social Determinants of Health   Financial Resource  Strain: Not on file  Food Insecurity: Not on file  Transportation Needs: Not on file  Physical Activity: Not on file  Stress: Not on file  Social Connections: Not on file  Intimate Partner Violence: Not on file    Family History:    Family History  Problem Relation Age of Onset   Cancer Mother        oral, brain   Diabetes Father      ROS:  Please see the history of present illness.   All other ROS reviewed and negative.     Physical Exam/Data:   Vitals:   01/12/23 1930 01/12/23 2030 01/12/23 2045 01/12/23 2100  BP: (!) 144/78 (!) 142/52 (!) 136/50 (!) 135/48  Pulse: 71 71 76 80  Resp: (!) 23 (!) 22 (!) 26 17  Temp:  TempSrc:      SpO2: 92% 100% 96% 90%  Weight:    84.4 kg  Height:    5\' 2"  (1.575 m)    Intake/Output Summary (Last 24 hours) at 01/12/2023 2214 Last data filed at 01/12/2023 1734 Gross per 24 hour  Intake 50 ml  Output --  Net 50 ml      01/12/2023    9:00 PM 10/26/2022    1:34 PM 07/25/2022    9:36 AM  Last 3 Weights  Weight (lbs) 186 lb 1.1 oz 186 lb 191 lb 3.2 oz  Weight (kg) 84.4 kg 84.369 kg 86.728 kg     Body mass index is 34.03 kg/m.  General:  chronically ill appearing, sitting upright in bed HEENT: normal Neck: no JVD Cardiac:  normal S1, S2; RRR; 2/6 systolic murmur Lungs: Mild respiratory distress at rest, diminished lung sounds bilaterally Abd: soft, nontender Ext: Mild pretibial edema bilaterally Musculoskeletal:  No deformities, BUE and BLE strength normal and equal Skin: warm and dry  Neuro:  CNs 2-12 intact, no focal abnormalities noted Psych:  Normal affect   EKG:  The EKG was personally reviewed and demonstrates: Atrial fibrillation with rapid ventricular response with ST segment depression and T wave inversions (personal read)  Relevant CV Studies: CT PE 01/12/2023  IMPRESSION: 1. No evidence of significant pulmonary embolus. 2. 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 Pulmonary consultation is suggested. 3. Aortic atherosclerosis.  TTE 01/2022     ECHOCARDIOGRAM REPORT     IMPRESSIONS    1. Left ventricular ejection fraction, by estimation, is >75%. The left ventricle has hyperdynamic function. The left ventricle has no regional wall motion abnormalities. There is mild left ventricular hypertrophy. Left ventricular diastolic parameters are indeterminate.  2. Right ventricular systolic function is normal. The right ventricular size is normal.  3. The mitral valve is normal in structure. Trivial mitral valve regurgitation.  4. AV is thickened, calcified with restricted motion. Difficult to see well.. Peak and mean gradients through the valve 29 and 17 mm Hg respectively AVA is 1.21 cm 2 Dimensionless index is 0.39. Overall consistent with mild to moderate AS. Compared to echo from 12.4.22, mean gradient is increased (10 to 17 mm Hg). Aortic valve regurgitation is not visualized.  5. The inferior vena cava is normal in size with greater than 50% respiratory variability, suggesting right atrial pressure of 3 mmHg.     Laboratory Data:  High Sensitivity Troponin:   Recent Labs  Lab 01/12/23 1545 01/12/23 1940  TROPONINIHS 20* 2,667*     Chemistry Recent Labs  Lab 01/12/23 1545 01/12/23 1625  NA 144 141  K 2.8* 4.4  CL 111  --   CO2 25  --   GLUCOSE 149*  --   BUN 11  --   CREATININE 0.61  --   CALCIUM 6.2*  --   MG 1.1*  --   GFRNONAA >60  --   ANIONGAP 8  --     Recent Labs  Lab 01/12/23 1545  PROT 3.9*  ALBUMIN 1.9*  AST 18  ALT 18  ALKPHOS 46  BILITOT 0.4   Lipids No results for input(s): "CHOL", "TRIG", "HDL", "LABVLDL", "LDLCALC", "CHOLHDL" in the last 168 hours.  Hematology Recent Labs  Lab 01/12/23 1545 01/12/23 1625  WBC 7.0  --   RBC 3.35*  --   HGB 10.2* 13.3  HCT 33.3* 39.0  MCV 99.4  --  MCH 30.4  --   MCHC 30.6  --   RDW 12.7  --   PLT 115*  --    Thyroid  No results for input(s): "TSH", "FREET4" in the last 168 hours.  BNP Recent Labs  Lab 01/12/23 1545  BNP 140.7*    DDimer No results for input(s): "DDIMER" in the last 168 hours.   Radiology/Studies:  CT Angio Chest PE W and/or Wo Contrast  Result Date: 01/12/2023 CLINICAL DATA:  Pulmonary embolus suspected with high probability. Weakness and nausea. EXAM: CT ANGIOGRAPHY CHEST WITH CONTRAST TECHNIQUE: Multidetector CT imaging of the chest was performed using the standard protocol during bolus administration of intravenous contrast. Multiplanar CT image reconstructions and MIPs were obtained to evaluate the vascular anatomy. RADIATION DOSE REDUCTION: This exam was performed according to the departmental dose-optimization program which includes automated exposure control, adjustment of the mA and/or kV according to patient size and/or use of iterative reconstruction technique. CONTRAST:  27mL OMNIPAQUE IOHEXOL 350 MG/ML SOLN COMPARISON:  09/18/2021 FINDINGS: Cardiovascular: Good opacification of the central and segmental pulmonary arteries representing technically adequate study. No focal filling defects. No evidence of significant pulmonary embolus. Normal heart size. No pericardial effusions. Calcification of the mitral valve annulus, coronary arteries, and aorta. No aortic aneurysm or dissection. Mediastinum/Nodes: Esophagus is decompressed with minimal esophageal hiatal hernia. Thyroid gland is unremarkable. No significant lymphadenopathy. Lungs/Pleura: Motion artifact limits examination. Mildly spiculated nodule in the superior segment of the left upper lung measuring 1.5 x 1.8 cm diameter. This is enlarged since the previous study, at which time it measured 4 mm diameter. 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 Pulmonary consultation is suggested. No focal consolidation or airspace disease. No pleural effusions. No pneumothorax. Mild  atelectasis in the lung bases. Upper Abdomen: Cholelithiasis.  No acute Musculoskeletal: Abnormalities. Degenerative changes in the spine. No destructive bone lesions. Review of the MIP images confirms the above findings. IMPRESSION: 1. No evidence of significant pulmonary embolus. 2. 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 Pulmonary consultation is suggested. 3. Aortic atherosclerosis. Electronically Signed   By: Lucienne Capers M.D.   On: 01/12/2023 20:24   DG Chest Portable 1 View  Result Date: 01/12/2023 CLINICAL DATA:  Shortness of breath EXAM: PORTABLE CHEST 1 VIEW COMPARISON:  01/17/22 FINDINGS: Atherosclerotic calcification of the aortic arch. Borderline enlargement of the cardiopericardial silhouette. Mild interstitial accentuation, as can commonly be encountered in smokers. No edema or airspace opacity. No blunting of the costophrenic angles. IMPRESSION: 1. Borderline enlargement of the cardiopericardial silhouette, without edema. 2. Mild interstitial accentuation, as can commonly be encountered in smokers. 3.  Aortic Atherosclerosis (ICD10-I70.0). Electronically Signed   By: Van Clines M.D.   On: 01/12/2023 16:26     Assessment and Plan:   Acute atrial fibrillation with rapid ventricular response, new onset Noted with A-fib with RVR with ventricular rates in the 150s with conversion to normal sinus rhythm with IV diltiazem.  Possible exacerbating factors include hypokalemia, hypomagnesemia and COPD exacerbation.  Given need for steroids for treatment of COPD exacerbation, high risk for A-fib recurrence.  CHA2DS2-VASc 7.  No previous history of anticoagulation use and denies history of hematochezia, melena, hematuria.  No evidence of shock at this time.  Would avoid diltiazem if possible until LVEF is determined. - Continue heparin drip with goal PTT 60-90 - Start metoprolol tartrate 12.5 mg twice daily - No  rhythm control agents at this  time - Aggressive electrolyte supplementation, goal K> 4 and goal mag> 2 - Will need anticoagulation at discharge given severely elevated CHA2DS2-VASc and risk for stroke, anticipate apixaban 5mg  BID - COPD treatment per medicine team  Type II myocardial infarction Presented with significant troponin elevation with delta and EKG changes suggestive of ischemia in the setting of atrial fibrillation with RVR and significant coronary calcifications consistent with type II MI.  Notably, no new chest pain or chest pain equivalent.  Her EKG is abnormal with fairly diffuse ST segment depression, worse when in atrial fibrillation with RVR.  This in combination with her significant troponin elevation and coronary calcifications in all 3 major epicardial arteries are concerning for 3V disease.  She has not undergone ischemic evaluation in the past though should likely be pursued, especially given other significant risk factors including PAD and smoking history as well as calcific disease seen at the mitral valve and the aorta.  There is also concern for malignancy on CT chest and may require further coronary investigation prior to treatment considerations. - ASA 325mg  - obtain one additional troponin - Already treating with heparin for atrial fibrillation with RVR - Obtain echocardiogram - Beta-blocker as above - Would benefit from ischemic evaluation -unable to exercise and dobutamine stress likely to precipitate atrial fibrillation with RVR, therefore regadenoson SPECT (vs PET d/t BMI) is likely best option.  Given significant coronary calcifications, CCTA less likely to be beneficial. - NPO MN for possible cath  Coronary calcifications Peripheral artery disease Hyperlipidemia Hypertension - Obtain lipid panel, goal LDL <70 - Continue aspirin and statin - Hold home amlodipine and losartan pending clinical course given relative hypotension associated with atrial fibrillation  with RVR  7.  Mild to moderate AS Last echo in 2023 with mean gradient 17 mm Hg, AVA is 1.21 cm 2 and Dimensionless index is 0.39.   - repeat echo  Risk Assessment/Risk Scores:     TIMI Risk Score for Unstable Angina or Non-ST Elevation MI:   The patient's TIMI risk score is 5, which indicates a 26% risk of all cause mortality, new or recurrent myocardial infarction or need for urgent revascularization in the next 14 days.  New York Heart Association (NYHA) Functional Class NYHA Class II  CHA2DS2-VASc Score =   7  This indicates a 11.2 % annual risk of stroke. The patient's score is based upon:           For questions or updates, please contact [place] Please consult [email or web address] for contact info under    Signed, Jeralene Huff, MD  01/12/2023 10:14 PM

## 2023-01-12 NOTE — ED Notes (Signed)
Pt assisted with bedpan for urinary needs.

## 2023-01-12 NOTE — Subjective & Objective (Signed)
Patient coming in from home secondary to nausea and weakness heart rates in the 180s has no prior history of atrial fibrillation Placed on nonrebreather satting in the 70s emergency department She has COPD and at baseline wears 3 L of O2 has been having worsening shortness of breath has had diarrhea today and 1 emesis.  She tried to increase her oxygen via shortness of breath did not improve.  Initially denied any chest pain

## 2023-01-12 NOTE — ED Notes (Signed)
Pt placed on non-rebreather/ 6L O2- pt was still satting in 70's/ provider made aware. EKG done

## 2023-01-12 NOTE — Assessment & Plan Note (Signed)
-   Order Sensitive  SSI     -  check TSH and HgA1C  - Hold by mouth medications*  

## 2023-01-12 NOTE — Assessment & Plan Note (Signed)
New onset  A. Fib -  - CHA2DS2 vas score 7 On heparin will need long term anticoagulation COPD exacerbation will try Zebeta for today Now rate controlled

## 2023-01-13 ENCOUNTER — Other Ambulatory Visit (HOSPITAL_COMMUNITY): Payer: Self-pay

## 2023-01-13 ENCOUNTER — Inpatient Hospital Stay (HOSPITAL_COMMUNITY): Payer: Medicare (Managed Care)

## 2023-01-13 DIAGNOSIS — I21A1 Myocardial infarction type 2: Secondary | ICD-10-CM

## 2023-01-13 DIAGNOSIS — I214 Non-ST elevation (NSTEMI) myocardial infarction: Secondary | ICD-10-CM

## 2023-01-13 DIAGNOSIS — E876 Hypokalemia: Secondary | ICD-10-CM | POA: Diagnosis not present

## 2023-01-13 DIAGNOSIS — I480 Paroxysmal atrial fibrillation: Secondary | ICD-10-CM | POA: Diagnosis not present

## 2023-01-13 DIAGNOSIS — J441 Chronic obstructive pulmonary disease with (acute) exacerbation: Secondary | ICD-10-CM | POA: Diagnosis not present

## 2023-01-13 DIAGNOSIS — R7989 Other specified abnormal findings of blood chemistry: Secondary | ICD-10-CM

## 2023-01-13 DIAGNOSIS — J9622 Acute and chronic respiratory failure with hypercapnia: Secondary | ICD-10-CM

## 2023-01-13 DIAGNOSIS — J449 Chronic obstructive pulmonary disease, unspecified: Secondary | ICD-10-CM

## 2023-01-13 LAB — TROPONIN I (HIGH SENSITIVITY)
Troponin I (High Sensitivity): 5742 ng/L (ref <18)
Troponin I (High Sensitivity): 6255 ng/L (ref <18)
Troponin I (High Sensitivity): 4959 ng/L (ref <18)
Troponin I (High Sensitivity): 4191 ng/L (ref <18)
Troponin I (High Sensitivity): 2071 ng/L (ref <18)

## 2023-01-13 LAB — CBC
HCT: 40.8 % (ref 36.0–46.0)
HCT: 41.7 % (ref 36.0–46.0)
Hemoglobin: 12.7 g/dL (ref 12.0–15.0)
Hemoglobin: 13.1 g/dL (ref 12.0–15.0)
MCH: 30.2 pg (ref 26.0–34.0)
MCH: 30.2 pg (ref 26.0–34.0)
MCHC: 31.1 g/dL (ref 30.0–36.0)
MCHC: 31.4 g/dL (ref 30.0–36.0)
MCV: 96.9 fL (ref 80.0–100.0)
MCV: 96.1 fL (ref 80.0–100.0)
Platelets: 165 10*3/uL (ref 150–400)
Platelets: 168 10*3/uL (ref 150–400)
RBC: 4.21 MIL/uL (ref 3.87–5.11)
RBC: 4.34 MIL/uL (ref 3.87–5.11)
RDW: 12.7 % (ref 11.5–15.5)
RDW: 12.6 % (ref 11.5–15.5)
WBC: 8.4 10*3/uL (ref 4.0–10.5)
WBC: 10 10*3/uL (ref 4.0–10.5)
nRBC: 0 % (ref 0.0–0.2)
nRBC: 0 % (ref 0.0–0.2)

## 2023-01-13 LAB — LACTIC ACID, PLASMA
Lactic Acid, Venous: 2.9 mmol/L (ref 0.5–1.9)
Lactic Acid, Venous: 1.2 mmol/L (ref 0.5–1.9)

## 2023-01-13 LAB — GLUCOSE, CAPILLARY
Glucose-Capillary: 141 mg/dL — ABNORMAL HIGH (ref 70–99)
Glucose-Capillary: 287 mg/dL — ABNORMAL HIGH (ref 70–99)
Glucose-Capillary: 235 mg/dL — ABNORMAL HIGH (ref 70–99)

## 2023-01-13 LAB — COMPREHENSIVE METABOLIC PANEL
ALT: 26 U/L (ref 0–44)
AST: 47 U/L — ABNORMAL HIGH (ref 15–41)
Albumin: 2.8 g/dL — ABNORMAL LOW (ref 3.5–5.0)
Alkaline Phosphatase: 63 U/L (ref 38–126)
Anion gap: 8 (ref 5–15)
BUN: 14 mg/dL (ref 8–23)
CO2: 32 mmol/L (ref 22–32)
Calcium: 8.8 mg/dL — ABNORMAL LOW (ref 8.9–10.3)
Chloride: 98 mmol/L (ref 98–111)
Creatinine, Ser: 0.72 mg/dL (ref 0.44–1.00)
GFR, Estimated: >60 mL/min (ref >60)
Glucose, Bld: 175 mg/dL — ABNORMAL HIGH (ref 70–99)
Potassium: 4.8 mmol/L (ref 3.5–5.1)
Sodium: 138 mmol/L (ref 135–145)
Total Bilirubin: 0.5 mg/dL (ref 0.3–1.2)
Total Protein: 5.9 g/dL — ABNORMAL LOW (ref 6.5–8.1)

## 2023-01-13 LAB — BASIC METABOLIC PANEL
Anion gap: 11 (ref 5–15)
BUN: 15 mg/dL (ref 8–23)
CO2: 33 mmol/L — ABNORMAL HIGH (ref 22–32)
Calcium: 9.2 mg/dL (ref 8.9–10.3)
Chloride: 92 mmol/L — ABNORMAL LOW (ref 98–111)
Creatinine, Ser: 0.97 mg/dL (ref 0.44–1.00)
GFR, Estimated: >60 mL/min (ref >60)
Glucose, Bld: 307 mg/dL — ABNORMAL HIGH (ref 70–99)
Potassium: 5.4 mmol/L — ABNORMAL HIGH (ref 3.5–5.1)
Sodium: 136 mmol/L (ref 135–145)

## 2023-01-13 LAB — FERRITIN: Ferritin: 87 ng/mL (ref 11–307)

## 2023-01-13 LAB — IRON AND TIBC
Iron: 84 ug/dL (ref 28–170)
Saturation Ratios: 24 % (ref 10.4–31.8)
TIBC: 354 ug/dL (ref 250–450)
UIBC: 270 ug/dL

## 2023-01-13 LAB — ECHOCARDIOGRAM COMPLETE
AR max vel: 1.25 cm2
AV Area VTI: 1.26 cm2
AV Area mean vel: 1.12 cm2
AV Mean grad: 12.3 mmHg
AV Peak grad: 19 mmHg
Ao pk vel: 2.18 m/s
Area-P 1/2: 3.19 cm2
Calc EF: 75.2 %
Height: 62 in
S' Lateral: 2 cm
Single Plane A2C EF: 74.4 %
Single Plane A4C EF: 76.8 %
Weight: 2977.09 oz

## 2023-01-13 LAB — BLOOD GAS, VENOUS
Acid-Base Excess: 12.4 mmol/L — ABNORMAL HIGH (ref 0.0–2.0)
Bicarbonate: 39.6 mmol/L — ABNORMAL HIGH (ref 20.0–28.0)
O2 Saturation: 94.1 %
Patient temperature: 37
pCO2, Ven: 61 mmHg — ABNORMAL HIGH (ref 44–60)
pH, Ven: 7.42 (ref 7.25–7.43)
pO2, Ven: 59 mmHg — ABNORMAL HIGH (ref 32–45)

## 2023-01-13 LAB — PHOSPHORUS
Phosphorus: 4.3 mg/dL (ref 2.5–4.6)
Phosphorus: 3.5 mg/dL (ref 2.5–4.6)

## 2023-01-13 LAB — LIPID PANEL
Cholesterol: 92 mg/dL (ref 0–200)
HDL: 51 mg/dL (ref >40)
LDL Cholesterol: 32 mg/dL (ref 0–99)
Total CHOL/HDL Ratio: 1.8 RATIO
Triglycerides: 46 mg/dL (ref <150)
VLDL: 9 mg/dL (ref 0–40)

## 2023-01-13 LAB — FOLATE: Folate: 21 ng/mL (ref >5.9)

## 2023-01-13 LAB — RETICULOCYTES
Immature Retic Fract: 6.5 % (ref 2.3–15.9)
RBC.: 4.51 MIL/uL (ref 3.87–5.11)
Retic Count, Absolute: 58.6 10*3/uL (ref 19.0–186.0)
Retic Ct Pct: 1.3 % (ref 0.4–3.1)

## 2023-01-13 LAB — MAGNESIUM
Magnesium: 2.1 mg/dL (ref 1.7–2.4)
Magnesium: 2 mg/dL (ref 1.7–2.4)

## 2023-01-13 LAB — PREALBUMIN: Prealbumin: 13 mg/dL — ABNORMAL LOW (ref 18–38)

## 2023-01-13 LAB — CBG MONITORING, ED
Glucose-Capillary: 155 mg/dL — ABNORMAL HIGH (ref 70–99)
Glucose-Capillary: 178 mg/dL — ABNORMAL HIGH (ref 70–99)
Glucose-Capillary: 292 mg/dL — ABNORMAL HIGH (ref 70–99)

## 2023-01-13 LAB — CK: Total CK: 256 U/L — ABNORMAL HIGH (ref 38–234)

## 2023-01-13 LAB — VITAMIN B12: Vitamin B-12: 566 pg/mL (ref 180–914)

## 2023-01-13 LAB — HEPARIN LEVEL (UNFRACTIONATED): Heparin Unfractionated: 0.34 IU/mL (ref 0.30–0.70)

## 2023-01-13 LAB — TSH: TSH: 0.573 u[IU]/mL (ref 0.350–4.500)

## 2023-01-13 MED ORDER — METHYLPREDNISOLONE SODIUM SUCC 40 MG IJ SOLR
40.0000 mg | Freq: Two times a day (BID) | INTRAMUSCULAR | Status: AC
  Administered 2023-01-13 (×2): 40 mg via INTRAVENOUS
  Filled 2023-01-13 (×2): qty 1

## 2023-01-13 MED ORDER — SODIUM CHLORIDE 0.9 % IV SOLN
1.0000 g | INTRAVENOUS | Status: AC
  Administered 2023-01-13 – 2023-01-17 (×5): 1 g via INTRAVENOUS
  Filled 2023-01-13 (×5): qty 10

## 2023-01-13 MED ORDER — FENTANYL CITRATE PF 50 MCG/ML IJ SOSY
12.5000 ug | PREFILLED_SYRINGE | INTRAMUSCULAR | Status: DC | PRN
  Administered 2023-01-13: 50 ug via INTRAVENOUS
  Filled 2023-01-13: qty 1

## 2023-01-13 MED ORDER — ATORVASTATIN CALCIUM 10 MG PO TABS
10.0000 mg | ORAL_TABLET | Freq: Every day | ORAL | Status: DC
  Administered 2023-01-13 – 2023-01-15 (×3): 10 mg via ORAL
  Filled 2023-01-13 (×3): qty 1

## 2023-01-13 MED ORDER — ASPIRIN 81 MG PO TBEC
81.0000 mg | DELAYED_RELEASE_TABLET | Freq: Every day | ORAL | Status: DC
  Administered 2023-01-13 – 2023-01-18 (×5): 81 mg via ORAL
  Filled 2023-01-13 (×6): qty 1

## 2023-01-13 MED ORDER — SODIUM CHLORIDE 0.9 % IV SOLN: INTRAVENOUS | Status: DC

## 2023-01-13 MED ORDER — SODIUM CHLORIDE 0.9 % IV BOLUS
500.0000 mL | Freq: Once | INTRAVENOUS | Status: AC
  Administered 2023-01-13: 500 mL via INTRAVENOUS

## 2023-01-13 MED ORDER — ACETAMINOPHEN 650 MG RE SUPP: 650.0000 mg | Freq: Four times a day (QID) | RECTAL | Status: DC | PRN

## 2023-01-13 MED ORDER — ONDANSETRON HCL 4 MG/2ML IJ SOLN: 4.0000 mg | Freq: Four times a day (QID) | INTRAMUSCULAR | Status: DC | PRN

## 2023-01-13 MED ORDER — GUAIFENESIN ER 600 MG PO TB12
600.0000 mg | ORAL_TABLET | Freq: Two times a day (BID) | ORAL | Status: DC
  Administered 2023-01-13 – 2023-01-18 (×11): 600 mg via ORAL
  Filled 2023-01-13 (×11): qty 1

## 2023-01-13 MED ORDER — PREDNISONE 20 MG PO TABS
40.0000 mg | ORAL_TABLET | Freq: Every day | ORAL | Status: DC
  Administered 2023-01-14: 40 mg via ORAL
  Filled 2023-01-13: qty 2

## 2023-01-13 MED ORDER — PERFLUTREN LIPID MICROSPHERE
1.0000 mL | INTRAVENOUS | Status: AC | PRN
  Administered 2023-01-13: 4 mL via INTRAVENOUS

## 2023-01-13 MED ORDER — ASPIRIN 325 MG PO TABS
325.0000 mg | ORAL_TABLET | Freq: Once | ORAL | Status: AC
  Administered 2023-01-13: 325 mg via ORAL
  Filled 2023-01-13: qty 1

## 2023-01-13 MED ORDER — IPRATROPIUM-ALBUTEROL 0.5-2.5 (3) MG/3ML IN SOLN
3.0000 mL | Freq: Four times a day (QID) | RESPIRATORY_TRACT | Status: DC
  Administered 2023-01-13 – 2023-01-14 (×4): 3 mL via RESPIRATORY_TRACT
  Filled 2023-01-13 (×5): qty 3

## 2023-01-13 MED ORDER — BISOPROLOL FUMARATE 5 MG PO TABS
5.0000 mg | ORAL_TABLET | Freq: Every day | ORAL | Status: DC
  Administered 2023-01-13 – 2023-01-18 (×6): 5 mg via ORAL
  Filled 2023-01-13 (×6): qty 1

## 2023-01-13 MED ORDER — HYDROCODONE-ACETAMINOPHEN 5-325 MG PO TABS: 1.0000 | ORAL_TABLET | ORAL | Status: DC | PRN

## 2023-01-13 MED ORDER — ALBUTEROL SULFATE (2.5 MG/3ML) 0.083% IN NEBU
2.5000 mg | INHALATION_SOLUTION | RESPIRATORY_TRACT | Status: DC | PRN
  Administered 2023-01-14: 2.5 mg via RESPIRATORY_TRACT

## 2023-01-13 MED ORDER — ACETAMINOPHEN 325 MG PO TABS
650.0000 mg | ORAL_TABLET | Freq: Four times a day (QID) | ORAL | Status: DC | PRN
  Administered 2023-01-16: 650 mg via ORAL
  Filled 2023-01-13: qty 2

## 2023-01-13 MED ORDER — PANTOPRAZOLE SODIUM 40 MG PO TBEC
40.0000 mg | DELAYED_RELEASE_TABLET | Freq: Every day | ORAL | Status: DC
  Administered 2023-01-13 – 2023-01-18 (×4): 40 mg via ORAL
  Filled 2023-01-13 (×6): qty 1

## 2023-01-13 MED ORDER — ONDANSETRON HCL 4 MG PO TABS: 4.0000 mg | ORAL_TABLET | Freq: Four times a day (QID) | ORAL | Status: DC | PRN

## 2023-01-13 NOTE — ED Notes (Signed)
ED TO INPATIENT HANDOFF REPORT  ED Nurse Name and Phone #: Wyline Copas  S Name/Age/Gender Deanna Petty 77 y.o. female Room/Bed: 039C/039C  Code Status   Code Status: DNR  Home/SNF/Other Home Patient oriented to: self, place, time, and situation Is this baseline? Yes   Triage Complete: Triage complete  Chief Complaint COPD with acute exacerbation [J44.1]  Triage Note Pt coming from home, experiencing weakness and nausea. EMS arrived and found her HR being 180BPM. Alert and oriented, no hx of afib.   Allergies No Known Allergies  Level of Care/Admitting Diagnosis ED Disposition     ED Disposition  Admit   Condition  --   Comment  Hospital Area: [facility] [[id number]]  Level of Care: Progressive [102]  Admit to Progressive based on following criteria: CARDIOVASCULAR & THORACIC of moderate stability with acute coronary syndrome symptoms/low risk myocardial infarction/hypertensive urgency/arrhythmias/heart failure potentially compromising stability and stable post cardiovascular intervention patients.  Admit to Progressive based on following criteria: MULTISYSTEM THREATS such as stable sepsis, metabolic/electrolyte imbalance with or without encephalopathy that is responding to early treatment.  May [facility] patient to [facility] if equivalent level of care is available:: No  Covid Evaluation: Confirmed COVID Negative  Diagnosis: COPD with acute exacerbation [id number]  Admitting Physician: Toy Baker [[id number]]  Attending Physician: Toy Baker [id number]  Certification:: I certify this patient will need inpatient services for at least 2 midnights  Estimated Length of Stay: 2          B Medical/Surgery History Past Medical History:  Diagnosis Date   Bronchitis    Cigarette smoker    COPD (chronic obstructive pulmonary disease) ([place])    Pneumonia    Shortness of breath    with exertion; lasted used inhaler 06/02/13    Past Surgical History:  Procedure Laterality Date   ABDOMINAL HYSTERECTOMY     BREAST LUMPECTOMY WITH NEEDLE LOCALIZATION Left 06/12/2013   Procedure: LEFT BREAST LUMPECTOMY WITH NEEDLE LOCALIZATION;  Surgeon: Imogene Burn. Tsuei, MD;  Location: [facility];  Service: General;  Laterality: Left;   BREAST SURGERY     HIP FRACTURE SURGERY Right      A IV Location/Drains/Wounds Patient Lines/Drains/Airways Status     Active Line/Drains/Airways     Name Placement date Placement time Site Days   Peripheral IV 01/12/23 20 G Right Antecubital 01/12/23  1355  Antecubital  1            Intake/Output Last 24 hours  Intake/Output Summary (Last 24 hours) at 01/13/2023 [id number] Last data filed at 01/13/2023 0331 Gross per 24 hour  Intake 550 ml  Output --  Net 550 ml    Labs/Imaging Results for orders placed or performed during the hospital encounter of 01/12/23 (from the past 48 hour(s))  Resp panel by RT-PCR (RSV, Flu A&B, Covid) Anterior Nasal Swab     Status: None   Collection Time: 01/12/23  3:40 PM   Specimen: Anterior Nasal Swab  Result Value Ref Range   SARS Coronavirus 2 by RT PCR NEGATIVE NEGATIVE   Influenza A by PCR NEGATIVE NEGATIVE   Influenza B by PCR NEGATIVE NEGATIVE    Comment: (NOTE) The Xpert Xpress SARS-CoV-2/FLU/RSV plus assay is intended as an aid in the diagnosis of influenza from Nasopharyngeal swab specimens and should not be used as a sole basis for treatment. Nasal washings and aspirates are unacceptable for Xpert Xpress SARS-CoV-2/FLU/RSV testing.  Fact Sheet for Patients: EntrepreneurPulse.com.au  Fact Sheet  for Healthcare Providers: IncredibleEmployment.be  This test is not yet approved or cleared by the [place] and has been authorized for detection and/or diagnosis of SARS-CoV-2 by FDA under an Emergency Use Authorization (EUA). This EUA will remain in effect (meaning this test can be used) for the  duration of the COVID-19 declaration under Section 564(b)(1) of the Act, 21 U.S.C. section 360bbb-3(b)(1), unless the authorization is terminated or revoked.     Resp Syncytial Virus by PCR NEGATIVE NEGATIVE    Comment: (NOTE) Fact Sheet for Patients: EntrepreneurPulse.com.au  Fact Sheet for Healthcare Providers: IncredibleEmployment.be  This test is not yet approved or cleared by the [place] FDA and has been authorized for detection and/or diagnosis of SARS-CoV-2 by FDA under an Emergency Use Authorization (EUA). This EUA will remain in effect (meaning this test can be used) for the duration of the COVID-19 declaration under Section 564(b)(1) of the Act, 21 U.S.C. section 360bbb-3(b)(1), unless the authorization is terminated or revoked.  Performed at [facility], [place] [address] [place], [place] [address]   CBC with Differential     Status: Abnormal   Collection Time: 01/12/23  3:45 PM  Result Value Ref Range   WBC 7.0 4.0 - 10.5 K/uL   RBC 3.35 (L) 3.87 - 5.11 MIL/uL   Hemoglobin 10.2 (L) 12.0 - 15.0 g/dL   HCT 33.3 (L) 36.0 - 46.0 %   MCV 99.4 80.0 - 100.0 fL   MCH 30.4 26.0 - 34.0 pg   MCHC 30.6 30.0 - 36.0 g/dL   RDW 12.7 11.5 - 15.5 %   Platelets 115 (L) 150 - 400 K/uL   nRBC 0.0 0.0 - 0.2 %   Neutrophils Relative % 71 %   Neutro Abs 4.9 1.7 - 7.7 K/uL   Lymphocytes Relative 21 %   Lymphs Abs 1.5 0.7 - 4.0 K/uL   Monocytes Relative 6 %   Monocytes Absolute 0.4 0.1 - 1.0 K/uL   Eosinophils Relative 1 %   Eosinophils Absolute 0.1 0.0 - 0.5 K/uL   Basophils Relative 1 %   Basophils Absolute 0.0 0.0 - 0.1 K/uL   Immature Granulocytes 0 %   Abs Immature Granulocytes 0.03 0.00 - 0.07 K/uL    Comment: Performed at [facility] [address], [place], [place] [address]  Comprehensive metabolic panel     Status: Abnormal   Collection Time: 01/12/23  3:45 PM  Result Value Ref Range   Sodium 144 135 - 145 mmol/L    Potassium 2.8 (L) 3.5 - 5.1 mmol/L   Chloride 111 98 - 111 mmol/L   CO2 25 22 - 32 mmol/L   Glucose, Bld 149 (H) 70 - 99 mg/dL    Comment: Glucose reference range applies only to samples taken after fasting for at least 8 hours.   BUN 11 8 - 23 mg/dL   Creatinine, Ser 0.61 0.44 - 1.00 mg/dL   Calcium 6.2 (LL) 8.9 - 10.3 mg/dL    Comment: CRITICAL RESULT CALLED TO, READ BACK BY AND VERIFIED WITH A.TOWNSLY RN 1823 01/12/23 MCCORMICK K   Total Protein 3.9 (L) 6.5 - 8.1 g/dL   Albumin 1.9 (L) 3.5 - 5.0 g/dL   AST 18 15 - 41 U/L   ALT 18 0 - 44 U/L   Alkaline Phosphatase 46 38 - 126 U/L   Total Bilirubin 0.4 0.3 - 1.2 mg/dL   GFR, Estimated >60 >60 mL/min    Comment: (NOTE) Calculated using the  CKD-EPI Creatinine Equation (2021)    Anion gap 8 5 - 15    Comment: Performed at [facility], [place] [address], [place], [place] [address]  Brain natriuretic peptide     Status: Abnormal   Collection Time: 01/12/23  3:45 PM  Result Value Ref Range   B Natriuretic Peptide 140.7 (H) 0.0 - 100.0 pg/mL    Comment: Performed at [facility] [address], [place], [place] [address]  Magnesium     Status: Abnormal   Collection Time: 01/12/23  3:45 PM  Result Value Ref Range   Magnesium 1.1 (L) 1.7 - 2.4 mg/dL    Comment: Performed at [facility] [address], [place], [place] [address]  Troponin I (High Sensitivity)     Status: Abnormal   Collection Time: 01/12/23  3:45 PM  Result Value Ref Range   Troponin I (High Sensitivity) 20 (H) <18 ng/L    Comment: (NOTE) Elevated high sensitivity troponin I (hsTnI) values and significant  changes across serial measurements may suggest ACS but many other  chronic and acute conditions are known to elevate hsTnI results.  Refer to the "Links" section for chest pain algorithms and additional  guidance. Performed at [facility], [place] [address], [place], [place] [address]   I-Stat venous blood gas, [facility], [facility], [facility])      Status: Abnormal   Collection Time: 01/12/23  4:25 PM  Result Value Ref Range   pH, Ven 7.288 7.25 - 7.43   pCO2, Ven 81.7 (HH) 44 - 60 mmHg   pO2, Ven 45 32 - 45 mmHg   Bicarbonate 39.1 (H) 20.0 - 28.0 mmol/L   TCO2 42 (H) 22 - 32 mmol/L   O2 Saturation 73 %   Acid-Base Excess 9.0 (H) 0.0 - 2.0 mmol/L   Sodium 141 135 - 145 mmol/L   Potassium 4.4 3.5 - 5.1 mmol/L   Calcium, Ion 1.22 1.15 - 1.40 mmol/L   HCT 39.0 36.0 - 46.0 %   Hemoglobin 13.3 12.0 - 15.0 g/dL   Sample type VENOUS    Comment NOTIFIED PHYSICIAN   Troponin I (High Sensitivity)     Status: Abnormal   Collection Time: 01/12/23  7:40 PM  Result Value Ref Range   Troponin I (High Sensitivity) 2,667 (HH) <18 ng/L    Comment: CRITICAL RESULT CALLED TO, READ BACK BY AND VERIFIED WITH S.TUCKER,RN @2045  01/12/2023 VANG.J (NOTE) Elevated high sensitivity troponin I (hsTnI) values and significant  changes across serial measurements may suggest ACS but many other  chronic and acute conditions are known to elevate hsTnI results.  Refer to the "Links" section for chest pain algorithms and additional  guidance. Performed at [facility], [place] [address] [place],  [address]   Urinalysis, Complete w Microscopic -Urine, Clean Catch     Status: Abnormal   Collection Time: 01/12/23 10:55 PM  Result Value Ref Range   Color, Urine YELLOW YELLOW   APPearance HAZY (A) CLEAR   Specific Gravity, Urine >1.046 (H) 1.005 - 1.030   pH 6.0 5.0 - 8.0   Glucose, UA 50 (A) NEGATIVE mg/dL   Hgb urine dipstick SMALL (A) NEGATIVE   Bilirubin Urine NEGATIVE NEGATIVE   Ketones, ur NEGATIVE NEGATIVE mg/dL   Protein, ur 100 (A) NEGATIVE mg/dL   Nitrite NEGATIVE NEGATIVE   Leukocytes,Ua NEGATIVE NEGATIVE   RBC / HPF 21-50 0 - 5 RBC/hpf   WBC, UA 0-5 0 -  5 WBC/hpf   Bacteria, UA NONE SEEN NONE SEEN   Squamous Epithelial / HPF 0-5 0 - 5 /HPF    Comment: Performed at [facility], [place] [address] [place], [place] [address]   Troponin I (High Sensitivity)     Status: Abnormal   Collection Time: 01/12/23 10:58 PM  Result Value Ref Range   Troponin I (High Sensitivity) 5,742 (HH) <18 ng/L    Comment: CRITICAL RESULT CALLED TO, READ BACK BY AND VERIFIED WITH SHELBY TUCKER RN 01/13/23 0040 M KOROLESKI (NOTE) Elevated high sensitivity troponin I (hsTnI) values and significant  changes across serial measurements may suggest ACS but many other  chronic and acute conditions are known to elevate hsTnI results.  Refer to the "Links" section for chest pain algorithms and additional  guidance. Performed at [facility], [place] [address] [place], [place] [address]   CK     Status: Abnormal   Collection Time: 01/12/23 10:58 PM  Result Value Ref Range   Total CK 256 (H) 38 - 234 U/L    Comment: Performed at [facility], [place] [address] [place], [place] [address]  Magnesium     Status: None   Collection Time: 01/12/23 10:58 PM  Result Value Ref Range   Magnesium 2.1 1.7 - 2.4 mg/dL    Comment: Performed at [facility], [place] [address], [place], [place] [address]  Phosphorus     Status: None   Collection Time: 01/12/23 10:58 PM  Result Value Ref Range   Phosphorus 4.3 2.5 - 4.6 mg/dL    Comment: Performed at [facility], [place] [address] [place], [place] [address]  TSH     Status: None   Collection Time: 01/12/23 10:58 PM  Result Value Ref Range   TSH 0.573 0.350 - 4.500 uIU/mL    Comment: Performed by a 3rd Generation assay with a functional sensitivity of <=0.01 uIU/mL. Performed at [facility], [place] [address], [place], [place] [address]   Lactic acid, plasma     Status: Abnormal   Collection Time: 01/12/23 10:58 PM  Result Value Ref Range   Lactic Acid, Venous 2.9 (HH) 0.5 - 1.9 mmol/L    Comment: ATTEMPTED CALL TO [phone] 01/12/23 2346 M KOROLESKI CRITICAL RESULT CALLED TO, READ BACK BY AND VERIFIED WITH Wilburn Cornelia TUCKER RN 01/13/23 0040 Wiliam Ke Performed at [facility], [place] [address], [place], [place] [id number]   Basic metabolic panel     Status: Abnormal   Collection Time: 01/12/23 10:58 PM  Result Value Ref Range   Sodium 136 135 - 145 mmol/L    Comment: DELTA CHECK NOTED   Potassium 5.4 (H) 3.5 - 5.1 mmol/L    Comment: DELTA CHECK NOTED   Chloride 92 (L) 98 - 111 mmol/L   CO2 33 (H) 22 - 32 mmol/L   Glucose, Bld 307 (H) 70 - 99 mg/dL    Comment: Glucose reference range applies only to samples taken after fasting for at least 8 hours.   BUN 15 8 - 23 mg/dL   Creatinine, Ser 0.97 0.44 - 1.00 mg/dL   Calcium 9.2 8.9 - 10.3 mg/dL    Comment: DELTA CHECK NOTED   GFR, Estimated >60 >60 mL/min    Comment: (NOTE) Calculated using the CKD-EPI Creatinine Equation (2021)    Anion gap 11 5 - 15    Comment: Performed at [facility]  [address], [place], [place] [address]  CBC with Differential/Platelet     Status: Abnormal   Collection Time: 01/12/23 10:58 PM  Result Value Ref Range   WBC 8.4 4.0 - 10.5 K/uL   RBC 4.50 3.87 - 5.11 MIL/uL   Hemoglobin 13.5 12.0 - 15.0 g/dL    Comment: REPEATED TO VERIFY NOTIFIED S TUCKER RN WAS INFORMED TO RELEASE [id number] JWHITE    HCT 44.0 36.0 - 46.0 %   MCV 97.8 80.0 - 100.0 fL   MCH 30.0 26.0 - 34.0 pg   MCHC 30.7 30.0 - 36.0 g/dL   RDW 12.7 11.5 - 15.5 %   Platelets 173 150 - 400 K/uL   nRBC 0.0 0.0 - 0.2 %   Neutrophils Relative % 92 %   Neutro Abs 7.7 1.7 - 7.7 K/uL   Lymphocytes Relative 7 %   Lymphs Abs 0.6 (L) 0.7 - 4.0 K/uL   Monocytes Relative 0 %   Monocytes Absolute 0.0 (L) 0.1 - 1.0 K/uL   Eosinophils Relative 0 %   Eosinophils Absolute 0.0 0.0 - 0.5 K/uL   Basophils Relative 0 %   Basophils Absolute 0.0 0.0 - 0.1 K/uL   Immature Granulocytes 1 %   Abs Immature Granulocytes 0.05 0.00 - 0.07 K/uL    Comment: Performed at [facility] [address], [place], [place] [address]  I-Stat venous blood gas, ED     Status: Abnormal   Collection Time: 01/12/23 11:00 PM   Result Value Ref Range   pH, Ven 7.332 7.25 - 7.43   pCO2, Ven 74.8 (HH) 44 - 60 mmHg   pO2, Ven 33 32 - 45 mmHg   Bicarbonate 39.6 (H) 20.0 - 28.0 mmol/L   TCO2 42 (H) 22 - 32 mmol/L   O2 Saturation 57 %   Acid-Base Excess 10.0 (H) 0.0 - 2.0 mmol/L   Sodium 137 135 - 145 mmol/L   Potassium 5.3 (H) 3.5 - 5.1 mmol/L   Calcium, Ion 1.18 1.15 - 1.40 mmol/L   HCT 43.0 36.0 - 46.0 %   Hemoglobin 14.6 12.0 - 15.0 g/dL   Sample type VENOUS    Comment NOTIFIED PHYSICIAN   CBG monitoring, ED     Status: Abnormal   Collection Time: 01/13/23 12:06 AM  Result Value Ref Range   Glucose-Capillary 292 (H) 70 - 99 mg/dL    Comment: Glucose reference range applies only to samples taken after fasting for at least 8 hours.  CBC     Status: None   Collection Time: 01/13/23  1:50 AM  Result Value Ref Range   WBC 8.4 4.0 - 10.5 K/uL   RBC 4.21 3.87 - 5.11 MIL/uL   Hemoglobin 12.7 12.0 - 15.0 g/dL   HCT 40.8 36.0 - 46.0 %   MCV 96.9 80.0 - 100.0 fL   MCH 30.2 26.0 - 34.0 pg   MCHC 31.1 30.0 - 36.0 g/dL   RDW 12.7 11.5 - 15.5 %   Platelets 165 150 - 400 K/uL   nRBC 0.0 0.0 - 0.2 %    Comment: Performed at [facility], [place] [address] [place], [place] [address]  Lipid panel     Status: None   Collection Time: 01/13/23  1:50 AM  Result Value Ref Range   Cholesterol 92 0 - 200 mg/dL   Triglycerides 46 <150 mg/dL   HDL 51 >40 mg/dL   Total CHOL/HDL Ratio 1.8 RATIO   VLDL 9 0 - 40  mg/dL   LDL Cholesterol 32 0 - 99 mg/dL    Comment:        Total Cholesterol/HDL:CHD Risk Coronary Heart Disease Risk Table                     Men   Women  1/2 Average Risk   3.4   3.3  Average Risk       5.0   4.4  2 X Average Risk   9.6   7.1  3 X Average Risk  23.4   11.0        Use the calculated Patient Ratio above and the CHD Risk Table to determine the patient's CHD Risk.        ATP III CLASSIFICATION (LDL):  <100     mg/dL   Optimal  100-129  mg/dL   Near or Above                    Optimal   130-159  mg/dL   Borderline  160-189  mg/dL   High  >190     mg/dL   Very High Performed at [facility] [address] [place], [place] [address]   Prealbumin     Status: Abnormal   Collection Time: 01/13/23  1:50 AM  Result Value Ref Range   Prealbumin 13 (L) 18 - 38 mg/dL    Comment: Performed at [facility] [address], [place], [place] [address]  Lactic acid, plasma     Status: None   Collection Time: 01/13/23  1:50 AM  Result Value Ref Range   Lactic Acid, Venous 1.2 0.5 - 1.9 mmol/L    Comment: Performed at [facility] [address] [place], [place] [address]  Blood gas, venous     Status: Abnormal   Collection Time: 01/13/23  1:58 AM  Result Value Ref Range   pH, Ven 7.42 7.25 - 7.43   pCO2, Ven 61 (H) 44 - 60 mmHg   pO2, Ven 59 (H) 32 - 45 mmHg   Bicarbonate 39.6 (H) 20.0 - 28.0 mmol/L   Acid-Base Excess 12.4 (H) 0.0 - 2.0 mmol/L   O2 Saturation 94.1 %   Patient temperature 37.0     Comment: Performed at [facility] [address] [place], [place] [address]  Troponin I (High Sensitivity)     Status: Abnormal   Collection Time: 01/13/23  3:10 AM  Result Value Ref Range   Troponin I (High Sensitivity) 6,255 (HH) <18 ng/L    Comment: CRITICAL VALUE NOTED. VALUE IS CONSISTENT WITH PREVIOUSLY REPORTED/CALLED VALUE (NOTE) Elevated high sensitivity troponin I (hsTnI) values and significant  changes across serial measurements may suggest ACS but many other  chronic and acute conditions are known to elevate hsTnI results.  Refer to the "Links" section for chest pain algorithms and additional  guidance. Performed at [facility], [place] [address] [place], [place] [address]   CBG monitoring, ED     Status: Abnormal   Collection Time: 01/13/23  4:10 AM  Result Value Ref Range   Glucose-Capillary 178 (H) 70 - 99 mg/dL    Comment: Glucose reference range applies only to samples taken after fasting for at least 8 hours.  Heparin level  (unfractionated)     Status: None   Collection Time: 01/13/23  6:46 AM  Result Value Ref Range   Heparin Unfractionated 0.34 0.30 - 0.70 IU/mL  Comment: (NOTE) The clinical reportable range upper limit is being lowered to >1.10 to align with the FDA approved guidance for the current laboratory assay.  If heparin results are below expected values, and patient dosage has  been confirmed, suggest follow up testing of antithrombin III levels. Performed at [facility], [place] [address], [place], [place] [address]   Magnesium     Status: None   Collection Time: 01/13/23  7:10 AM  Result Value Ref Range   Magnesium 2.0 1.7 - 2.4 mg/dL    Comment: Performed at [facility] [address], [place], [place] [address]  Phosphorus     Status: None   Collection Time: 01/13/23  7:10 AM  Result Value Ref Range   Phosphorus 3.5 2.5 - 4.6 mg/dL    Comment: Performed at [facility] [address] [place], [place] [address]  Comprehensive metabolic panel     Status: Abnormal   Collection Time: 01/13/23  7:10 AM  Result Value Ref Range   Sodium 138 135 - 145 mmol/L   Potassium 4.8 3.5 - 5.1 mmol/L   Chloride 98 98 - 111 mmol/L   CO2 32 22 - 32 mmol/L   Glucose, Bld 175 (H) 70 - 99 mg/dL    Comment: Glucose reference range applies only to samples taken after fasting for at least 8 hours.   BUN 14 8 - 23 mg/dL   Creatinine, Ser 0.72 0.44 - 1.00 mg/dL   Calcium 8.8 (L) 8.9 - 10.3 mg/dL   Total Protein 5.9 (L) 6.5 - 8.1 g/dL   Albumin 2.8 (L) 3.5 - 5.0 g/dL   AST 47 (H) 15 - 41 U/L   ALT 26 0 - 44 U/L   Alkaline Phosphatase 63 38 - 126 U/L   Total Bilirubin 0.5 0.3 - 1.2 mg/dL   GFR, Estimated >60 >60 mL/min    Comment: (NOTE) Calculated using the CKD-EPI Creatinine Equation (2021)    Anion gap 8 5 - 15    Comment: Performed at [facility], [place] [address], [place], [place] [address]  CBC     Status: None   Collection Time: 01/13/23  7:10 AM  Result Value Ref  Range   WBC 10.0 4.0 - 10.5 K/uL   RBC 4.34 3.87 - 5.11 MIL/uL   Hemoglobin 13.1 12.0 - 15.0 g/dL   HCT 41.7 36.0 - 46.0 %   MCV 96.1 80.0 - 100.0 fL   MCH 30.2 26.0 - 34.0 pg   MCHC 31.4 30.0 - 36.0 g/dL   RDW 12.6 11.5 - 15.5 %   Platelets 168 150 - 400 K/uL   nRBC 0.0 0.0 - 0.2 %    Comment: Performed at [facility], [place] [address] [place], [place] [address]  Troponin I (High Sensitivity)     Status: Abnormal   Collection Time: 01/13/23  7:10 AM  Result Value Ref Range   Troponin I (High Sensitivity) 4,959 (HH) <18 ng/L    Comment: CRITICAL VALUE NOTED. VALUE IS CONSISTENT WITH PREVIOUSLY REPORTED/CALLED VALUE (NOTE) Elevated high sensitivity troponin I (hsTnI) values and significant  changes across serial measurements may suggest ACS but many other  chronic and acute conditions are known to elevate hsTnI results.  Refer to the "Links" section for chest pain algorithms and additional  guidance. Performed at [facility], [place] [address] [place],  [address]   CBG monitoring, ED     Status: Abnormal  Collection Time: 01/13/23  7:40 AM  Result Value Ref Range   Glucose-Capillary 155 (H) 70 - 99 mg/dL    Comment: Glucose reference range applies only to samples taken after fasting for at least 8 hours.   CT Angio Chest PE W and/or Wo Contrast  Result Date: 01/12/2023 CLINICAL DATA:  Pulmonary embolus suspected with high probability. Weakness and nausea. EXAM: CT ANGIOGRAPHY CHEST WITH CONTRAST TECHNIQUE: Multidetector CT imaging of the chest was performed using the standard protocol during bolus administration of intravenous contrast. Multiplanar CT image reconstructions and MIPs were obtained to evaluate the vascular anatomy. RADIATION DOSE REDUCTION: This exam was performed according to the departmental dose-optimization program which includes automated exposure control, adjustment of the mA and/or kV according to patient size and/or use of iterative  reconstruction technique. CONTRAST:  27mL OMNIPAQUE IOHEXOL 350 MG/ML SOLN COMPARISON:  09/18/2021 FINDINGS: Cardiovascular: Good opacification of the central and segmental pulmonary arteries representing technically adequate study. No focal filling defects. No evidence of significant pulmonary embolus. Normal heart size. No pericardial effusions. Calcification of the mitral valve annulus, coronary arteries, and aorta. No aortic aneurysm or dissection. Mediastinum/Nodes: Esophagus is decompressed with minimal esophageal hiatal hernia. Thyroid gland is unremarkable. No significant lymphadenopathy. Lungs/Pleura: Motion artifact limits examination. Mildly spiculated nodule in the superior segment of the left upper lung measuring 1.5 x 1.8 cm diameter. This is enlarged since the previous study, at which time it measured 4 mm diameter. 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 Pulmonary consultation is suggested. No focal consolidation or airspace disease. No pleural effusions. No pneumothorax. Mild atelectasis in the lung bases. Upper Abdomen: Cholelithiasis.  No acute Musculoskeletal: Abnormalities. Degenerative changes in the spine. No destructive bone lesions. Review of the MIP images confirms the above findings. IMPRESSION: 1. No evidence of significant pulmonary embolus. 2. 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 Pulmonary consultation is suggested. 3. Aortic atherosclerosis. Electronically Signed   By: Lucienne Capers M.D.   On: 01/12/2023 20:24   DG Chest Portable 1 View  Result Date: 01/12/2023 CLINICAL DATA:  Shortness of breath EXAM: PORTABLE CHEST 1 VIEW COMPARISON:  01/17/22 FINDINGS: Atherosclerotic calcification of the aortic arch. Borderline enlargement of the cardiopericardial silhouette. Mild interstitial accentuation, as can commonly be encountered in smokers. No edema  or airspace opacity. No blunting of the costophrenic angles. IMPRESSION: 1. Borderline enlargement of the cardiopericardial silhouette, without edema. 2. Mild interstitial accentuation, as can commonly be encountered in smokers. 3.  Aortic Atherosclerosis (ICD10-I70.0). Electronically Signed   By: Van Clines M.D.   On: 01/12/2023 16:26    Pending Labs Unresulted Labs (From admission, onward)     Start     Ordered   01/14/23 0500  Heparin level (unfractionated)  Daily,   R      01/12/23 2114   01/13/23 0500  CBC  Daily,   R      01/12/23 2114   01/12/23 2231  Hemoglobin A1c  Once,   R       Comments: To assess prior glycemic control    01/12/23 2230            Vitals/Pain Today's Vitals   01/13/23 0445 01/13/23 0707 01/13/23 0758 01/13/23 0814  BP: (!) 153/113  (!) 158/55   Pulse: 76  72   Resp: (!) 28  (!) 24   Temp:  98.9 F (37.2 C)    TempSrc:  Oral  SpO2: 92%  99%   Weight:      Height:      PainSc:    3     Isolation Precautions No active isolations  Medications Medications  heparin ADULT infusion 100 units/mL (25000 units/226mL) (850 Units/hr Intravenous Rate/Dose Change 01/13/23 0806)  albuterol (PROVENTIL) (2.5 MG/3ML) 0.083% nebulizer solution 2.5 mg (has no administration in time range)  atorvastatin (LIPITOR) tablet 10 mg (has no administration in time range)  pantoprazole (PROTONIX) EC tablet 40 mg (has no administration in time range)  bisoprolol (ZEBETA) tablet 5 mg (has no administration in time range)  acetaminophen (TYLENOL) tablet 650 mg (has no administration in time range)    Or  acetaminophen (TYLENOL) suppository 650 mg (has no administration in time range)  HYDROcodone-acetaminophen (NORCO/VICODIN) 5-325 MG per tablet 1-2 tablet (has no administration in time range)  ondansetron (ZOFRAN) tablet 4 mg (has no administration in time range)    Or  ondansetron (ZOFRAN) injection 4 mg (has no administration in time range)  cefTRIAXone  (ROCEPHIN) 1 g in sodium chloride 0.9 % 100 mL IVPB (1 g Intravenous New Bag/Given 01/13/23 0805)  methylPREDNISolone sodium succinate (SOLU-MEDROL) 40 mg/mL injection 40 mg (40 mg Intravenous Given 01/13/23 0809)    Followed by  predniSONE (DELTASONE) tablet 40 mg (has no administration in time range)  ipratropium-albuterol (DUONEB) 0.5-2.5 (3) MG/3ML nebulizer solution 3 mL (3 mLs Nebulization Given 01/13/23 0808)  fentaNYL (SUBLIMAZE) injection 12.5-50 mcg (50 mcg Intravenous Given 01/13/23 0745)  aspirin EC tablet 81 mg (has no administration in time range)  guaiFENesin (MUCINEX) 12 hr tablet 600 mg (has no administration in time range)  insulin aspart (novoLOG) injection 0-9 Units (2 Units Subcutaneous Given 01/13/23 0807)  0.9 %  sodium chloride infusion ( Intravenous New Bag/Given 01/13/23 0025)  diltiazem (CARDIZEM) injection 10 mg (10 mg Intravenous Given 01/12/23 1555)  methylPREDNISolone sodium succinate (SOLU-MEDROL) 125 mg/2 mL injection 125 mg (125 mg Intravenous Given 01/12/23 1640)  levalbuterol (XOPENEX) nebulizer solution 1.25 mg (1.25 mg Nebulization Given 01/12/23 1641)  ipratropium (ATROVENT) nebulizer solution 0.5 mg (0.5 mg Nebulization Given 01/12/23 1641)  magnesium sulfate IVPB 2 g 50 mL (0 g Intravenous Stopped 01/12/23 1734)  potassium chloride (KLOR-CON) packet 20 mEq (20 mEq Oral Given 01/12/23 2119)  iohexol (OMNIPAQUE) 350 MG/ML injection 75 mL (75 mLs Intravenous Contrast Given 01/12/23 2012)  heparin bolus via infusion 4,000 Units (4,000 Units Intravenous Bolus from Bag 01/12/23 2121)  aspirin tablet 325 mg (325 mg Oral Given 01/13/23 0221)  sodium chloride 0.9 % bolus 500 mL (0 mLs Intravenous Stopped 01/13/23 0331)    Mobility walks with device     Focused Assessments Cardiac Assessment Handoff:  Cardiac Rhythm: Atrial fibrillation Lab Results  Component Value Date   CKTOTAL 256 (H) 01/12/2023   No results found for: "DDIMER" Does the Patient currently have  chest pain?  Was having sever lower back pain this am given medication pt now is comfortable and pain free   R Recommendations: See Admitting Provider Note  Report given to:   Additional Notes: .

## 2023-01-13 NOTE — Progress Notes (Signed)
Initial Nutrition Assessment  DOCUMENTATION CODES:   Not applicable  INTERVENTION:   Changed pt to room service appropriate  Explained menu options and ordering process to patient Encouraged pt to focus on adequate protein and fruit/vegetable intake Vitamin C level pending along with iron panel and Vitamin B12  NUTRITION DIAGNOSIS:   Predicted suboptimal nutrient intake related to  (hospitalization) as evidenced by  (recent COPD exacerbation and lung nodule).  GOAL:   Patient will meet greater than or equal to 90% of their needs  MONITOR:   PO intake  REASON FOR ASSESSMENT:   Consult Assessment of nutrition requirement/status  ASSESSMENT:   Pt with PMH of life long smoker, COPD O2 dependent, CHF, HTN, and LUL mass followed by Dr Vaughan Browner as outpatient admitted for non-ST elevation MI and enlarging LUL mass.   Spoke with pt who reports no recent weight changes and has a good appetite PTA.    Medications reviewed and include: SSI, solumedrol, protonix  Labs reviewed: troponin 4191 CBG's: 141-178  NUTRITION - FOCUSED PHYSICAL EXAM:  Flowsheet Row Most Recent Value  Orbital Region No depletion  Upper Arm Region No depletion  Thoracic and Lumbar Region No depletion  Buccal Region No depletion  Temple Region No depletion  Clavicle Bone Region No depletion  Clavicle and Acromion Bone Region No depletion  Scapular Bone Region No depletion  Dorsal Hand No depletion  Patellar Region No depletion  Anterior Thigh Region No depletion  Posterior Calf Region No depletion  Edema (RD Assessment) None  Hair Reviewed  Eyes Reviewed  Mouth Reviewed  Skin Reviewed  [dry/flaky]  Nails Reviewed       Diet Order:   Diet Order             Diet Carb Modified Fluid consistency: Thin; Room service appropriate? Yes  Diet effective now                   EDUCATION NEEDS:   Education needs have been addressed  Skin:  Skin Assessment: Reviewed RN Assessment  Last  BM:  unknown  Height:   Ht Readings from Last 1 Encounters:  01/12/23 5\' 2"  (1.575 m)    Weight:   Wt Readings from Last 1 Encounters:  01/12/23 84.4 kg    BMI:  Body mass index is 34.03 kg/m.  Estimated Nutritional Needs:   Kcal:  1500-1700  Protein:  75-85 grams  Fluid:  >1.5 L/day  Lockie Pares., RD, LDN, CNSC See AMiON for contact information

## 2023-01-13 NOTE — Consult Note (Signed)
NAME:  Deanna Petty, MRN:  [id number], DOB:  1946-05-11, LOS: 1 ADMISSION DATE:  01/12/2023, CONSULTATION DATE: 01/13/2023 REFERRING MD: [facility], CHIEF COMPLAINT: Enlarging left upper lobe spiculated mass  History of Present Illness:  77 year old lifelong smoker who has known left upper lobe mass is followed by Dr. Vaughan Browner in the outpatient she was admitted for non-ST elevation MI and CT scan revealed enlarging spiculated mass left upper lobe.  She is currently on a heparin drip being treated for her non-ST elevation MI, atrial fibrillation ventricular response and history heart failure.  Pulmonary critical care asked to evaluate for questionable intervention for enlarging left upper lobe spiculated mass noted to be 1.8 cm on the latest CT scan.  She is followed by Dr. Vaughan Browner on outpatient basis.  The most reasonable route for evaluation of this pulmonary nodule and need to follow-up with Dr. Vaughan Browner is about patient she will need PET scan to evaluate for possible metastases.  Need to meet with her and the family as to her willingness to undergo this procedure and her ability to withstand strain of this procedure.  Once the current ST elevation MI has been treated appropriately  Pertinent  Medical History   Past Medical History:  Diagnosis Date   Bronchitis    Cigarette smoker    COPD (chronic obstructive pulmonary disease) (HCC)    Pneumonia    Shortness of breath    with exertion; lasted used inhaler 06/02/13     Significant Hospital Events: Including procedures, antibiotic start and stop dates in addition to other pertinent events   None ST elevation MI  Interim History / Subjective:   Acute distress on low-dose oxygen  Objective   Blood pressure (!) 143/55, pulse 75, temperature 97.9 F (36.6 C), temperature source Oral, resp. rate (!) 24, height 5\' 2"  (1.575 m), weight 84.4 kg, SpO2 (!) 87 %.        Intake/Output Summary (Last 24 hours) at 01/13/2023 1156 Last data filed at  01/13/2023 0331 Gross per 24 hour  Intake 550 ml  Output --  Net 550 ml   Filed Weights   01/12/23 2100  Weight: 84.4 kg    Examination: General: 77 year old female who looks older than stated age but is sitting on the side of bed no acute distress HENT: No JVD or lymphadenopathy Lungs: Mild rhonchi bilaterally Cardiovascular: Heart sounds are regular Abdomen: Obese soft nontender Extremities: Mild edema Neuro: Follows commands moves all extremities gets frustrated with trying to remember things in the past. GU: [facility] Problem list     Assessment & Plan:  Enlarging 1.8 cm left upper lobe spiculated mass has been followed by Dr. Vaughan Browner as an outpatient.  She was to have a CT scan performed but has forgotten to have it scheduled and is an incidental finding on this admission for non-ST elevated MI.  He will most likely need to follow-up with Dr. Vaughan Browner in office she will need a PET scan and an evaluation with her and family members to whether or not she would want to undergo navigational bronchoscopy.  She does carry the  status of a DNR and she does not want to be on life support. Follow-up with Dr. Vaughan Browner as an outpatient. She will need a PET scan. And evaluation for possible navigational bronchoscopy  O2 dependent respiratory failure with continued smoking despite being on oxygen for the last year, underlying COPD Follow-up with Dr. Almon Register steroids Continue bronchodilators as needed  Non-ST elevation  MI with elevated troponins currently on a heparin drip Atrial fibrillation with rapid trickle response Congestive heart failure Hypertension Per cardiology  Best Practice (right click and "Reselect all SmartList Selections" daily)   Diet/type: Regular consistency (see orders) DVT prophylaxis: systemic heparin GI prophylaxis: PPI Lines: N/A Foley:  N/A Code Status:  DNR Last date of multidisciplinary goals of care discussion [tbd]  Labs    CBC: Recent Labs  Lab 01/12/23 1545 01/12/23 1625 01/12/23 2258 01/12/23 2300 01/13/23 0150 01/13/23 0710  WBC 7.0  --  8.4  --  8.4 10.0  NEUTROABS 4.9  --  7.7  --   --   --   HGB 10.2* 13.3 13.5 14.6 12.7 13.1  HCT 33.3* 39.0 44.0 43.0 40.8 41.7  MCV 99.4  --  97.8  --  96.9 96.1  PLT 115*  --  173  --  165 [id number]    Basic Metabolic Panel: Recent Labs  Lab 01/12/23 1545 01/12/23 1625 01/12/23 2258 01/12/23 2300 01/13/23 0710  NA 144 141 136 137 138  K 2.8* 4.4 5.4* 5.3* 4.8  CL 111  --  92*  --  98  CO2 25  --  33*  --  32  GLUCOSE 149*  --  307*  --  175*  BUN 11  --  15  --  14  CREATININE 0.61  --  0.97  --  0.72  CALCIUM 6.2*  --  9.2  --  8.8*  MG 1.1*  --  2.1  --  2.0  PHOS  --   --  4.3  --  3.5   GFR: Estimated Creatinine Clearance: 61.2 mL/min (by C-G formula based on SCr of 0.72 mg/dL). Recent Labs  Lab 01/12/23 1545 01/12/23 2258 01/13/23 0150 01/13/23 0710  WBC 7.0 8.4 8.4 10.0  LATICACIDVEN  --  2.9* 1.2  --     Liver Function Tests: Recent Labs  Lab 01/12/23 1545 01/13/23 0710  AST 18 47*  ALT 18 26  ALKPHOS 46 63  BILITOT 0.4 0.5  PROT 3.9* 5.9*  ALBUMIN 1.9* 2.8*   No results for input(s): "LIPASE", "AMYLASE" in the last 168 hours. No results for input(s): "AMMONIA" in the last 168 hours.  ABG    Component Value Date/Time   PHART 7.32 (L) 01/17/2022 0648   PCO2ART 63 (H) 01/17/2022 0648   PO2ART 96 01/17/2022 0648   HCO3 39.6 (H) 01/13/2023 0158   TCO2 42 (H) 01/12/2023 2300   O2SAT 94.1 01/13/2023 0158     Coagulation Profile: No results for input(s): "INR", "PROTIME" in the last 168 hours.  Cardiac Enzymes: Recent Labs  Lab 01/12/23 2258  CKTOTAL 256*    HbA1C: Hgb A1c MFr Bld  Date/Time Value Ref Range Status  01/15/2022 07:59 PM 7.1 (H) 4.8 - 5.6 % Final    Comment:    (NOTE) Pre diabetes:          5.7%-6.4%  Diabetes:              >6.4%  Glycemic control for   <7.0% adults with diabetes    09/18/2021 12:55 PM 7.4 (H) 4.8 - 5.6 % Final    Comment:    (NOTE) Pre diabetes:          5.7%-6.4%  Diabetes:              >6.4%  Glycemic control for   <7.0% adults with diabetes     CBG: Recent Labs  Lab  01/13/23 0006 01/13/23 0410 01/13/23 0740 01/13/23 1011  GLUCAP 292* 178* 155* 141*    Review of Systems:   10 point review of system taken, please see HPI for positives and negatives.   Past Medical History:  She,  has a past medical history of Bronchitis, Cigarette smoker, COPD (chronic obstructive pulmonary disease) ([place]), Pneumonia, and Shortness of breath.   Surgical History:   Past Surgical History:  Procedure Laterality Date   ABDOMINAL HYSTERECTOMY     BREAST LUMPECTOMY WITH NEEDLE LOCALIZATION Left 06/12/2013   Procedure: LEFT BREAST LUMPECTOMY WITH NEEDLE LOCALIZATION;  Surgeon: Imogene Burn. Georgette Dover, MD;  Location: [facility];  Service: General;  Laterality: Left;   BREAST SURGERY     HIP FRACTURE SURGERY Right      Social History:   reports that she has been smoking cigarettes. She has a 10.00 pack-year smoking history. She has never used smokeless tobacco. She reports current alcohol use. She reports that she does not use drugs.   Family History:  Her family history includes Cancer in her mother; Diabetes in her father.   Allergies No Known Allergies   Home Medications  Prior to Admission medications   Medication Sig Start Date End Date Taking? Authorizing Provider  Acetaminophen (TYLENOL PO) Take 2 tablets by mouth daily as needed (pain).   Yes [provider]  amLODipine (NORVASC) 10 MG tablet Take 10 mg by mouth daily. 11/09/21  Yes [provider]  ASCORBIC ACID PO Take 1 tablet by mouth daily. Vitamin C, unknown strength   Yes [provider]  atorvastatin (LIPITOR) 10 MG tablet Take 10 mg by mouth at bedtime. 07/29/21  Yes [provider]  Calcium Carb-Cholecalciferol (CALCIUM + VITAMIN D3 PO) Take 1 tablet by  mouth daily.   Yes [provider]  fluticasone (FLONASE) 50 MCG/ACT nasal spray Place 1-2 sprays into both nostrils daily as needed for allergies.   Yes [provider]  Fluticasone-Umeclidin-Vilant (TRELEGY ELLIPTA) 100-62.5-25 MCG/ACT AEPB Inhale 1 puff into the lungs daily. 05/24/22  Yes Cobb, Karie Schwalbe, NP  losartan (COZAAR) 100 MG tablet Take 100 mg by mouth at bedtime. 02/01/22  Yes [provider]  metFORMIN (GLUCOPHAGE) 1000 MG tablet Take 1,000 mg by mouth 2 (two) times daily.   Yes [provider]  metoprolol succinate (TOPROL-XL) 50 MG 24 hr tablet Take 50 mg by mouth at bedtime.   Yes [provider]  Multiple Vitamin (MULTIVITAMIN) tablet Take 1 tablet by mouth daily.   Yes [provider]  metFORMIN (GLUCOPHAGE) 500 MG tablet Take 1 tablet (500 mg total) by mouth 2 (two) times daily with a meal. Patient not taking: Reported on 01/13/2023 07/07/20 01/15/22  [place] Shi, MD     Critical care time: Ferol Luz Joelyn Lover ACNP Acute Care Nurse Practitioner [place] Please consult Amion 01/13/2023, 11:56 AM

## 2023-01-13 NOTE — TOC Benefit Eligibility Note (Signed)
Patient Teacher, English as a foreign language completed.    The patient is currently admitted and upon discharge could be taking Eliquis 5 mg.  The current 30 day co-pay is $0.00.   The patient is currently admitted and upon discharge could be taking Xarelto 20 mg.  The current 30 day co-pay is $0.00.   The patient is insured through [employer] Part D   This test claim was processed through [facility] amounts may vary at other pharmacies due to pharmacy/plan contracts, or as the patient moves through the different stages of their insurance plan.  Lyndel Safe, [place] Patient Advocate Specialist [place] Patient Advocate Team Direct Number: [phone]  Fax: [phone]

## 2023-01-13 NOTE — Progress Notes (Signed)
PT Cancellation Note  Patient Details Name: MARIELA KUZIA MRN: [id number] DOB: [birth year]   Cancelled Treatment:    Reason Eval/Treat Not Completed: Medical issues which prohibited therapy. Troponin continuing to uptrend. Notes suggest pt pending possible cardiac cath. PT will hold until pt is more medically stable.   Zenaida Niece 01/13/2023, 8:08 AM

## 2023-01-13 NOTE — TOC Initial Note (Signed)
Transition of Care [facility]) - Initial/Assessment Note    Patient Details  Name: Deanna Petty MRN: [id number] Date of Birth: Mar 21, 1946  Transition of [facility]) CM/SW Contact:    Verdell Carmine, RN Phone Number: 01/13/2023, 12:40 PM  Clinical Narrative:                 77 year old patient, smoker, presented with chest pain atrial fibrillation. She is mostly sedentary and smokes 3 cigarettes a day, she is on oxygen at home. She has son and friends to support her.  She has no PCP one suggested on AVS to assist her with continuation of care.  TOC will follow for needs, recommendations, and transitions of care   Barriers to Discharge: Continued Medical Work up   Patient Goals and CMS Choice            Expected Discharge Plan and Services       Living arrangements for the past 2 months: [place]                                      Prior Living Arrangements/Services Living arrangements for the past 2 months: Single Family Home Lives with:: Self Patient language and need for interpreter reviewed:: Yes        Need for Family Participation in Patient Care: Yes (Comment) Care giver support system in place?: Yes (comment) Current home services: DME (RW, oxygen) Criminal Activity/Legal Involvement Pertinent to Current Situation/Hospitalization: No - Comment as needed  Activities of Daily Living      Permission Sought/Granted                  Emotional Assessment       Orientation: : Oriented to Self, Oriented to Place, Oriented to Situation Alcohol / Substance Use: Tobacco Use Psych Involvement: No (comment)  Admission diagnosis:  Elevated troponin [R79.89] COPD exacerbation [J44.1] Atrial fibrillation with rapid ventricular response [I48.91] COPD with acute exacerbation [J44.1] Patient Active Problem List   Diagnosis Date Noted   COPD with acute exacerbation ([place]) 01/12/2023   New onset atrial fibrillation ([place]) 01/12/2023   Non-STEMI  (non-ST elevated myocardial infarction) ([place]) 01/12/2023   Elevated troponin 01/12/2023   Hypokalemia 01/12/2023   Chronic respiratory failure with hypoxia ([place]) 05/24/2022   Hypomagnesemia 01/16/2022   COPD exacerbation ([place]) 01/15/2022   Essential hypertension 01/15/2022   Acute on chronic respiratory failure with hypoxia and hypercapnia ([place]) 09/18/2021   COPD (chronic obstructive pulmonary disease) ([place])    Type 2 diabetes mellitus with hyperlipidemia ([place]) 07/07/2020   Acute on chronic diastolic CHF (congestive heart failure) ([place]) 07/07/2020   Polycythemia, secondary 07/07/2020   Tobacco use 07/03/2020   Elevated brain natriuretic peptide (BNP) level 07/03/2020   Left breast mass 05/14/2013   PCP:  Pcp, No Pharmacy:   [facility] [address] ([address]), [place] [id number] [place] ([place]) [place] [address] Phone: [phone] Fax: [phone]  [facility] [place], [place] [address] [place] [address] Phone: [phone] Fax: [place] [address] [place] [address] Phone: [phone] Fax: [phone]     Social Determinants of Health (SDOH) Social History: SDOH Screenings   Tobacco Use: High Risk (10/26/2022)   SDOH Interventions:     Readmission Risk Interventions  No data to display

## 2023-01-13 NOTE — ED Notes (Signed)
Pt reports that she feels better resp went from 38 to 17

## 2023-01-13 NOTE — Progress Notes (Signed)
PROGRESS NOTE  Deanna Petty [id number] DOB: 1946-02-14   PCP: Pcp, No  Patient is from: Home.  Uses cane at baseline  DOA: 01/12/2023 LOS: 1  Chief complaints Chief Complaint  Patient presents with   Tachycardia    AFIB     Brief Narrative / Interim history: 77 year old F with PMH of COPD/chronic hypoxic RF on 3 L, moderate AS, DM-2, tobacco use disorder (lifetime smoker), HTN, HLD and obesity presenting with cough, shortness of breath, nausea, weakness and increased heart rate, and admitted for new onset A-fib with RVR, COPD exacerbation and NSTEMI.  In ED, in RVR to 151.  RR 34.  Desaturated to 81% on home 3 L.  VBG with acute on chronic respiratory acidosis.  K2.8.  Mg 1.1.  BNP 140.  Troponin 20 and trended up to 2700.  EKG in A-fib with RVR to 149.  Repeat EKG sinus rhythm with RBBB and TWI in inferolateral leads.  CTA chest negative for PE but enlarging spiculated 1.8 cm nodule in LUL concerning for bronchogenic carcinoma.  Patient converted to sinus rhythm after IV Cardizem push.  Started on IV heparin and COPD meds.  Cardiology consulted.  TTE ordered.   Subjective: Seen and examined earlier this morning.  Feels better.  Denies chest pain, dyspnea, palpitation or dizziness.  Reports chronic DOE unchanged from baseline.  Remains in sinus rhythm.  Troponin peaked at 6000 and trended down.  Objective: Vitals:   01/13/23 0445 01/13/23 0707 01/13/23 0758 01/13/23 1005  BP: (!) 153/113  (!) 158/55 (!) 143/55  Pulse: 76  72 75  Resp: (!) 28  (!) 24   Temp:  98.9 F (37.2 C)  97.9 F (36.6 C)  TempSrc:  Oral  Oral  SpO2: 92%  99% (!) 87%  Weight:      Height:        Examination:  GENERAL: No apparent distress.  Nontoxic. HEENT: MMM.  Vision and hearing grossly intact.  NECK: Supple.  No apparent JVD.  RESP:  No IWOB.  Somewhat diminished aeration with bibasilar crackles. CVS:  RRR. Heart sounds normal.  ABD/GI/GU: BS+. Abd soft, NTND.  MSK/EXT:  Moves extremities.  No apparent deformity. No edema.  SKIN: no apparent skin lesion or wound NEURO: Awake, alert and oriented appropriately.  No apparent focal neuro deficit. PSYCH: Calm. Normal affect.   Procedures:  None  Microbiology summarized: [id number], influenza and RSV PCR nonreactive  Assessment and plan: Principal Problem:   COPD with acute exacerbation ([place]) Active Problems:   Tobacco use   Type 2 diabetes mellitus with hyperlipidemia (H[facility])   Elevated brain natriuretic peptide (BNP) level   Acute on chronic respiratory failure with hypoxia and hypercapnia ([facility]CC)   Essential hypertension   Hypomagnesemia   New onset atrial fibrillation (HCC)   Non-STEMI (non-ST elevated myocardial infarction) (HCC)   Elevated troponin   Hypokalemia  New onset A-fib with RVR: Converted to sinus rhythm after IV Cardizem push in ED.  Remains in sinus rhythm.  Could be due to COPD exacerbation and electrolyte derangement.  CHA2DS2-VASc score > 5 -Cardiology on board -Continue Zebeta and IV heparin -Optimize electrolytes -Follow echocardiogram -Check TSH   COPD exacerbation: Likely due to ongoing cigarette smoking.  Lifetime smoker.  Reports good compliance with Trelegy.  Improved. -Continue Solu-Medrol, antibiotics and scheduled DuoNeb -Discussed the importance of smoking cessation  Acute on chronic respiratory failure with hypoxia and hypercapnia: On 3 L at baseline.  Seems to have resolved. -Minimum oxygen to  keep saturation above 88% -Incentive spirometry/OOB/PT/OT  Non-STEMI: Patient without chest pain or anginal symptoms.  Troponin peaked at 6000 and started to trend down.  EKG with TWI in inferolateral leads but not new. -Cardiology on board -Continue IV heparin, Zebeta, aspirin and Lipitor -Check A1c and lipid panel -Follow TTE  Lung nodule: CT angio showed 1.8 cm enlarging and spiculated LUL nodule concerning for bronchogenic carcinoma.  Patient is lifetime smoker. -PCCM  consult  Hypokalemia/hypomagnesemia -Monitor replenish as appropriate  NIDDM-2 with hyperglycemia and hyperlipidemia: A1c 7.1% in 01/2022.  On metformin at home. Recent Labs  Lab 01/13/23 0006 01/13/23 0410 01/13/23 0740 01/13/23 1011  GLUCAP 292* 178* 155* 141*  -Continue SSI-sensitive -Check hemoglobin A1c -Check lipid panel  Tobacco use disorder: Reportedly cut down to 5 cigarettes a day.  Previously smoked up to > 2 packs a day since age of 16. -Encouraged smoking cessation -Likes to try nicotine gums.  Reports skin irritation with nicotine patch  Essential hypertension: BP slightly elevated. -Continue Zebeta for now -May resume home amlodipine after TTE results.  Physical deconditioning: Uses cane at baseline -PT/OT  Obesity Body mass index is 34.03 kg/m.          DVT prophylaxis:  SCDs Start: 01/13/23 0659 On full dose anticoagulation Code Status: DNR/DNI Family Communication: None at bedside Level of care: Progressive Status is: Inpatient Remains inpatient appropriate because: Respiratory failure, non-STEMI, A-fib and COPD exacerbation   Final disposition: TBD Consultants:  Cardiology PCCM  55 minutes with more than 50% spent in reviewing records, counseling patient/family and coordinating care.   Sch Meds:  Scheduled Meds:  aspirin EC  81 mg Oral Daily   atorvastatin  10 mg Oral QHS   bisoprolol  5 mg Oral Daily   guaiFENesin  600 mg Oral BID   insulin aspart  0-9 Units Subcutaneous Q4H   ipratropium-albuterol  3 mL Nebulization Q6H   methylPREDNISolone (SOLU-MEDROL) injection  40 mg Intravenous Q12H   Followed by   Derrill Memo ON 01/14/2023] predniSONE  40 mg Oral Q breakfast   pantoprazole  40 mg Oral Daily   Continuous Infusions:  sodium chloride 75 mL/hr at 01/13/23 0931   cefTRIAXone (ROCEPHIN)  IV Stopped (01/13/23 0904)   heparin 850 Units/hr (01/13/23 0806)   PRN Meds:.acetaminophen **OR** acetaminophen, albuterol, fentaNYL (SUBLIMAZE)  injection, HYDROcodone-acetaminophen, ondansetron **OR** ondansetron (ZOFRAN) IV, perflutren lipid microspheres (DEFINITY) IV suspension  Antimicrobials: Anti-infectives (From admission, onward)    Start     Dose/Rate Route Frequency Ordered Stop   01/13/23 0658  cefTRIAXone (ROCEPHIN) 1 g in sodium chloride 0.9 % 100 mL IVPB        1 g 200 mL/hr over 30 Minutes Intravenous Every 24 hours 01/13/23 0658 01/18/23 0659        I have personally reviewed the following labs and images: CBC: Recent Labs  Lab 01/12/23 1545 01/12/23 1625 01/12/23 2258 01/12/23 2300 01/13/23 0150 01/13/23 0710  WBC 7.0  --  8.4  --  8.4 10.0  NEUTROABS 4.9  --  7.7  --   --   --   HGB 10.2* 13.3 13.5 14.6 12.7 13.1  HCT 33.3* 39.0 44.0 43.0 40.8 41.7  MCV 99.4  --  97.8  --  96.9 96.1  PLT 115*  --  173  --  165 168   BMP &GFR Recent Labs  Lab 01/12/23 1545 01/12/23 1625 01/12/23 2258 01/12/23 2300 01/13/23 0710  NA 144 141 136 137 138  K 2.8* 4.4 5.4*  5.3* 4.8  CL 111  --  92*  --  98  CO2 25  --  33*  --  32  GLUCOSE 149*  --  307*  --  175*  BUN 11  --  15  --  14  CREATININE 0.61  --  0.97  --  0.72  CALCIUM 6.2*  --  9.2  --  8.8*  MG 1.1*  --  2.1  --  2.0  PHOS  --   --  4.3  --  3.5   Estimated Creatinine Clearance: 61.2 mL/min (by C-G formula based on SCr of 0.72 mg/dL). Liver & Pancreas: Recent Labs  Lab 01/12/23 1545 01/13/23 0710  AST 18 47*  ALT 18 26  ALKPHOS 46 63  BILITOT 0.4 0.5  PROT 3.9* 5.9*  ALBUMIN 1.9* 2.8*   No results for input(s): "LIPASE", "AMYLASE" in the last 168 hours. No results for input(s): "AMMONIA" in the last 168 hours. Diabetic: No results for input(s): "HGBA1C" in the last 72 hours. Recent Labs  Lab 01/13/23 0006 01/13/23 0410 01/13/23 0740 01/13/23 1011  GLUCAP 292* 178* 155* 141*   Cardiac Enzymes: Recent Labs  Lab 01/12/23 2258  CKTOTAL 256*   No results for input(s): "PROBNP" in the last 8760 hours. Coagulation  Profile: No results for input(s): "INR", "PROTIME" in the last 168 hours. Thyroid Function Tests: Recent Labs    01/12/23 2258  TSH 0.573   Lipid Profile: Recent Labs    01/13/23 0150  CHOL 92  HDL 51  LDLCALC 32  TRIG 46  CHOLHDL 1.8   Anemia Panel: No results for input(s): "VITAMINB12", "FOLATE", "FERRITIN", "TIBC", "IRON", "RETICCTPCT" in the last 72 hours. Urine analysis:    Component Value Date/Time   COLORURINE YELLOW 01/12/2023 2255   APPEARANCEUR HAZY (A) 01/12/2023 2255   LABSPEC >1.046 (H) 01/12/2023 2255   PHURINE 6.0 01/12/2023 2255   GLUCOSEU 50 (A) 01/12/2023 2255   HGBUR SMALL (A) 01/12/2023 2255   BILIRUBINUR NEGATIVE 01/12/2023 2255   KETONESUR NEGATIVE 01/12/2023 2255   PROTEINUR 100 (A) 01/12/2023 2255   NITRITE NEGATIVE 01/12/2023 2255   LEUKOCYTESUR NEGATIVE 01/12/2023 2255   Sepsis Labs: Invalid input(s): "PROCALCITONIN", "LACTICIDVEN"  Microbiology: Recent Results (from the past 240 hour(s))  Resp panel by RT-PCR (RSV, Flu A&B, Covid) Anterior Nasal Swab     Status: None   Collection Time: 01/12/23  3:40 PM   Specimen: Anterior Nasal Swab  Result Value Ref Range Status   SARS Coronavirus 2 by RT PCR NEGATIVE NEGATIVE Final   Influenza A by PCR NEGATIVE NEGATIVE Final   Influenza B by PCR NEGATIVE NEGATIVE Final    Comment: (NOTE) The Xpert Xpress SARS-CoV-2/FLU/RSV plus assay is intended as an aid in the diagnosis of influenza from Nasopharyngeal swab specimens and should not be used as a sole basis for treatment. Nasal washings and aspirates are unacceptable for Xpert Xpress SARS-CoV-2/FLU/RSV testing.  Fact Sheet for Patients: EntrepreneurPulse.com.au  Fact Sheet for Healthcare Providers: IncredibleEmployment.be  This test is not yet approved or cleared by the [place] FDA and has been authorized for detection and/or diagnosis of SARS-CoV-2 by FDA under an Emergency Use Authorization (EUA).  This EUA will remain in effect (meaning this test can be used) for the duration of the COVID-19 declaration under Section 564(b)(1) of the Act, 21 U.S.C. section 360bbb-3(b)(1), unless the authorization is terminated or revoked.     Resp Syncytial Virus by PCR NEGATIVE NEGATIVE Final    Comment: (  NOTE) Fact Sheet for Patients: EntrepreneurPulse.com.au  Fact Sheet for Healthcare Providers: IncredibleEmployment.be  This test is not yet approved or cleared by the [place] FDA and has been authorized for detection and/or diagnosis of SARS-CoV-2 by FDA under an Emergency Use Authorization (EUA). This EUA will remain in effect (meaning this test can be used) for the duration of the COVID-19 declaration under Section 564(b)(1) of the Act, 21 U.S.C. section 360bbb-3(b)(1), unless the authorization is terminated or revoked.  Performed at [facility], [place] [address], [place], [place] [address]     Radiology Studies: CT Angio Chest PE W and/or Wo Contrast  Result Date: 01/12/2023 CLINICAL DATA:  Pulmonary embolus suspected with high probability. Weakness and nausea. EXAM: CT ANGIOGRAPHY CHEST WITH CONTRAST TECHNIQUE: Multidetector CT imaging of the chest was performed using the standard protocol during bolus administration of intravenous contrast. Multiplanar CT image reconstructions and MIPs were obtained to evaluate the vascular anatomy. RADIATION DOSE REDUCTION: This exam was performed according to the departmental dose-optimization program which includes automated exposure control, adjustment of the mA and/or kV according to patient size and/or use of iterative reconstruction technique. CONTRAST:  59mL OMNIPAQUE IOHEXOL 350 MG/ML SOLN COMPARISON:  09/18/2021 FINDINGS: Cardiovascular: Good opacification of the central and segmental pulmonary arteries representing technically adequate study. No focal filling defects. No evidence of significant  pulmonary embolus. Normal heart size. No pericardial effusions. Calcification of the mitral valve annulus, coronary arteries, and aorta. No aortic aneurysm or dissection. Mediastinum/Nodes: Esophagus is decompressed with minimal esophageal hiatal hernia. Thyroid gland is unremarkable. No significant lymphadenopathy. Lungs/Pleura: Motion artifact limits examination. Mildly spiculated nodule in the superior segment of the left upper lung measuring 1.5 x 1.8 cm diameter. This is enlarged since the previous study, at which time it measured 4 mm diameter. 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 Pulmonary consultation is suggested. No focal consolidation or airspace disease. No pleural effusions. No pneumothorax. Mild atelectasis in the lung bases. Upper Abdomen: Cholelithiasis.  No acute Musculoskeletal: Abnormalities. Degenerative changes in the spine. No destructive bone lesions. Review of the MIP images confirms the above findings. IMPRESSION: 1. No evidence of significant pulmonary embolus. 2. 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 Pulmonary consultation is suggested. 3. Aortic atherosclerosis. Electronically Signed   By: Lucienne Capers M.D.   On: 01/12/2023 20:24   DG Chest Portable 1 View  Result Date: 01/12/2023 CLINICAL DATA:  Shortness of breath EXAM: PORTABLE CHEST 1 VIEW COMPARISON:  01/17/22 FINDINGS: Atherosclerotic calcification of the aortic arch. Borderline enlargement of the cardiopericardial silhouette. Mild interstitial accentuation, as can commonly be encountered in smokers. No edema or airspace opacity. No blunting of the costophrenic angles. IMPRESSION: 1. Borderline enlargement of the cardiopericardial silhouette, without edema. 2. Mild interstitial accentuation, as can commonly be encountered in smokers. 3.  Aortic Atherosclerosis (ICD10-I70.0). Electronically  Signed   By: Van Clines M.D.   On: 01/12/2023 16:26      Celester Morgan T. Milton  If 7PM-7AM, please contact night-coverage [email or web address] 01/13/2023, 11:13 AM

## 2023-01-13 NOTE — Progress Notes (Signed)
Pt resting on 4l [place]. No distress. Bipap not needed at this time.

## 2023-01-13 NOTE — ED Notes (Signed)
Cards at bedside

## 2023-01-13 NOTE — Progress Notes (Addendum)
Rounding Note    Patient Name: Deanna Petty Date of Encounter: 01/13/2023  [place] Cardiologist: None   Subjective   76 yof hx tobacco use, dm, htn  Presented feeling unwell, found to be in afiv rvr and copd exacerbation.  Describes her o2 sats dropping to 70s while in the bathroom yesterday. Had son call ems because she felt she was in enough distress that she needed to be in hospital. Has not noticed any chest pain. She lives with her son's ex SO.  Previously followed with dr Burt Knack. Follows with Dr. Vaughan Browner of pulmonology.  In the ED, found to be in Afib with rvr with EKG showing t wave inversions and st depressions. She received 10 mg IV diltiazem and cardioverted. Has been on heparin Initial k 2.8 and mag 1.1. Trops 20 and uptrended to 2667 Cardiology consulted for afib rvr and type 2 nstemi.   Inpatient Medications    Scheduled Meds:  aspirin EC  81 mg Oral Daily   atorvastatin  10 mg Oral QHS   bisoprolol  5 mg Oral Daily   guaiFENesin  600 mg Oral BID   insulin aspart  0-9 Units Subcutaneous Q4H   ipratropium-albuterol  3 mL Nebulization Q6H   methylPREDNISolone (SOLU-MEDROL) injection  40 mg Intravenous Q12H   Followed by   Derrill Memo ON 01/14/2023] predniSONE  40 mg Oral Q breakfast   pantoprazole  40 mg Oral Daily   Continuous Infusions:  sodium chloride 75 mL/hr at 01/13/23 0025   cefTRIAXone (ROCEPHIN)  IV Stopped (01/13/23 0904)   heparin 850 Units/hr (01/13/23 0806)   PRN Meds: acetaminophen **OR** acetaminophen, albuterol, fentaNYL (SUBLIMAZE) injection, HYDROcodone-acetaminophen, ondansetron **OR** ondansetron (ZOFRAN) IV   Vital Signs    Vitals:   01/13/23 0255 01/13/23 0445 01/13/23 0707 01/13/23 0758  BP:  (!) 153/113  (!) 158/55  Pulse:  76  72  Resp:  (!) 28  (!) 24  Temp: 97.9 F (36.6 C)  98.9 F (37.2 C)   TempSrc: Oral  Oral   SpO2:  92%  99%  Weight:      Height:        Intake/Output Summary (Last 24 hours) at 01/13/2023  0938 Last data filed at 01/13/2023 0331 Gross per 24 hour  Intake 550 ml  Output --  Net 550 ml      01/12/2023    9:00 PM 10/26/2022    1:34 PM 07/25/2022    9:36 AM  Last 3 Weights  Weight (lbs) 186 lb 1.1 oz 186 lb 191 lb 3.2 oz  Weight (kg) 84.4 kg 84.369 kg 86.728 kg      Telemetry     - Personally Reviewed  ECG    Inferolateral  t wave inversions unchanged compared to prior - Personally Reviewed  Physical Exam   GEN: No acute distress.   Neck: Bilateral carotid bruit Cardiac: RRR, Soft Outflow murmur, No lee Respiratory: Bibasilar crackles, Wheeze GI: Soft, nontender, non-distended  MS: No edema; No deformity. Neuro:  Nonfocal  Psych: Normal affect   Labs    High Sensitivity Troponin:   Recent Labs  Lab 01/12/23 1545 01/12/23 1940 01/12/23 2258 01/13/23 0310 01/13/23 0710  TROPONINIHS 20* 2,667* 5,742* 6,255* 4,959*     Chemistry Recent Labs  Lab 01/12/23 1545 01/12/23 1625 01/12/23 2258 01/12/23 2300 01/13/23 0710  NA 144   < > 136 137 138  K 2.8*   < > 5.4* 5.3* 4.8  CL 111  --  92*  --  98  CO2 25  --  33*  --  32  GLUCOSE 149*  --  307*  --  175*  BUN 11  --  15  --  14  CREATININE 0.61  --  0.97  --  0.72  CALCIUM 6.2*  --  9.2  --  8.8*  MG 1.1*  --  2.1  --  2.0  PROT 3.9*  --   --   --  5.9*  ALBUMIN 1.9*  --   --   --  2.8*  AST 18  --   --   --  47*  ALT 18  --   --   --  26  ALKPHOS 46  --   --   --  63  BILITOT 0.4  --   --   --  0.5  GFRNONAA >60  --  >60  --  >60  ANIONGAP 8  --  11  --  8   < > = values in this interval not displayed.    Lipids  Recent Labs  Lab 01/13/23 0150  CHOL 92  TRIG 46  HDL 51  LDLCALC 32  CHOLHDL 1.8    Hematology Recent Labs  Lab 01/12/23 2258 01/12/23 2300 01/13/23 0150 01/13/23 0710  WBC 8.4  --  8.4 10.0  RBC 4.50  --  4.21 4.34  HGB 13.5 14.6 12.7 13.1  HCT 44.0 43.0 40.8 41.7  MCV 97.8  --  96.9 96.1  MCH 30.0  --  30.2 30.2  MCHC 30.7  --  31.1 31.4  RDW 12.7  --  12.7  12.6  PLT 173  --  165 168   Thyroid  Recent Labs  Lab 01/12/23 2258  TSH 0.573    BNP Recent Labs  Lab 01/12/23 1545  BNP 140.7*    DDimer No results for input(s): "DDIMER" in the last 168 hours.   Radiology    CT Angio Chest PE W and/or Wo Contrast  Result Date: 01/12/2023 CLINICAL DATA:  Pulmonary embolus suspected with high probability. Weakness and nausea. EXAM: CT ANGIOGRAPHY CHEST WITH CONTRAST TECHNIQUE: Multidetector CT imaging of the chest was performed using the standard protocol during bolus administration of intravenous contrast. Multiplanar CT image reconstructions and MIPs were obtained to evaluate the vascular anatomy. RADIATION DOSE REDUCTION: This exam was performed according to the departmental dose-optimization program which includes automated exposure control, adjustment of the mA and/or kV according to patient size and/or use of iterative reconstruction technique. CONTRAST:  67mL OMNIPAQUE IOHEXOL 350 MG/ML SOLN COMPARISON:  09/18/2021 FINDINGS: Cardiovascular: Good opacification of the central and segmental pulmonary arteries representing technically adequate study. No focal filling defects. No evidence of significant pulmonary embolus. Normal heart size. No pericardial effusions. Calcification of the mitral valve annulus, coronary arteries, and aorta. No aortic aneurysm or dissection. Mediastinum/Nodes: Esophagus is decompressed with minimal esophageal hiatal hernia. Thyroid gland is unremarkable. No significant lymphadenopathy. Lungs/Pleura: Motion artifact limits examination. Mildly spiculated nodule in the superior segment of the left upper lung measuring 1.5 x 1.8 cm diameter. This is enlarged since the previous study, at which time it measured 4 mm diameter. 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 Pulmonary consultation is suggested. No focal consolidation or airspace disease. No pleural effusions. No  pneumothorax. Mild atelectasis in the lung bases. Upper Abdomen: Cholelithiasis.  No acute Musculoskeletal: Abnormalities. Degenerative changes in the spine. No destructive bone lesions. Review of the MIP images  confirms the above findings. IMPRESSION: 1. No evidence of significant pulmonary embolus. 2. 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 Pulmonary consultation is suggested. 3. Aortic atherosclerosis. Electronically Signed   By: Lucienne Capers M.D.   On: 01/12/2023 20:24   DG Chest Portable 1 View  Result Date: 01/12/2023 CLINICAL DATA:  Shortness of breath EXAM: PORTABLE CHEST 1 VIEW COMPARISON:  01/17/22 FINDINGS: Atherosclerotic calcification of the aortic arch. Borderline enlargement of the cardiopericardial silhouette. Mild interstitial accentuation, as can commonly be encountered in smokers. No edema or airspace opacity. No blunting of the costophrenic angles. IMPRESSION: 1. Borderline enlargement of the cardiopericardial silhouette, without edema. 2. Mild interstitial accentuation, as can commonly be encountered in smokers. 3.  Aortic Atherosclerosis (ICD10-I70.0). Electronically Signed   By: Van Clines M.D.   On: 01/12/2023 16:26    Cardiac Studies    CT PE 01/12/2023 IMPRESSION: 1. No evidence of significant pulmonary embolus. 2. 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 Pulmonary consultation is suggested. 3. Aortic atherosclerosis  01/2022 ECHO IMPRESSIONS  1. Left ventricular ejection fraction, by estimation, is >75%. The left ventricle has hyperdynamic function. The left ventricle has no regional wall motion abnormalities. There is mild left ventricular hypertrophy. Left ventricular diastolic parameters are indeterminate.  2. Right ventricular systolic function is normal. The right ventricular size is normal.  3. The  mitral valve is normal in structure. Trivial mitral valve regurgitation.  4. AV is thickened, calcified with restricted motion. Difficult to see well.. Peak and mean gradients through the valve 29 and 17 mm Hg respectively AVA is 1.21 cm 2 Dimensionless index is 0.39. Overall consistent with mild to moderate AS. Compared to echo from 12.4.22, mean gradient is increased (10 to 17 mm Hg). Aortic valve regurgitation is not visualized.  5. The inferior vena cava is normal in size with greater than 50% respiratory variability, suggesting right atrial pressure of 3 mmHg   Patient Profile     77 y.o. female hx of HTN, DM, tobacco use, COPD, who presented for hypoxia, found to be in Afib with RVR with type 2 NSTEMI.   Assessment & Plan    # NSTEMI type II Although patient does have coronary calcifcations and several risks factors for CAD, she is not currently having CP. She does have DM, but a1cs have been controlled and doubt there is autonomic neuropathy masking any CP that may be present. Demand ischemia seems more likely given clinical scenario. Trops have been trending up, now 6k, but do expect this to begin showing improvement since she is no longer in RVR.  EKG demonstrating t wave inversions in inferolateral leads, though these are also seen in EKG 1 year ago. ST depressions are resolved.   She is HDS and in NSR. No lactic acidoisis, warm with pulses, no LEE or JVD present. Crackles in lungs No indication for urgent/emergent cath - Asa - heparin - F/u echo - cycle trops, if they continue to trend up, may reconsider cath - no urgent/emergent cath at this time.   # Afib with RVR  Afib with RVR has resolved after receiving 10mg  IV diltiazem. She is now in sinus and rate controlled 70s with bisoprolol 5mg . HDS She is getting steroids so will be high risk of recurrence. - continue BB. Avoid diltiazem until new echo results. - Need to keep K and Mg repleted. - She will  need anticoagulation  for afib.  - Could consider long term cardiac monitor as outpatient to try to capture PAF.   # Coronary calcifications CTA showed calcifications of the coronaries and aorta. She is feeling well and stable clinically. Can perform outpatient non-invasive workup. If outpatient workup isn't high risk, can focus on risk factor modification. - on asa and statin  # Moderate AS Does have some exertional dyspnea, but has other reasons for this as well including her COPD. She is HDS - f/u ECHO  # Lung nodule She is still smoking. Spiculated nodule lung has enlarged since last time.  For questions or updates, please contact [place] Please consult [email or web address] for contact info under     Signed, Delene Ruffini, MD  01/13/2023, 9:38 AM    Agree with A & P of Dr Elliot Gurney  Patient consulted on yesterday because of new onset A-fib with RVR and positive troponins.  She had seen Dr. Burt Knack 10 years ago for preoperative clearance because of an abnormal EKG with inferolateral T wave inversion.  She does have multiple cardiac risk factors.  She is on home O2 (3 L) and measures her oxygen saturations.  She denies chest pain.  She developed more significant respiratory insufficiency been going to the bathroom with lowered oxygen saturations in the 70s and was transported to [facility] where she was found to be in A-fib with RVR.  Heart rate is 150.  She did have ST segment depression.  She did receive IV Dilt and converted to sinus rhythm.  Her initial troponins were in the 2000 range and ultimately went to 6000.  She does have T wave inversion unchanged from prior EKGs.  She is never had chest pain.  On exam she has inspiratory wheezes and occasional crackles.  She does have is soft outflow tract murmur and bilateral carotid bruits.  She has no peripheral edema.  She currently is on IV heparin.  Would place on Eliquis twice daily for stroke prophylaxis.  Recheck a 2D echocardiogram.   Suspect her enzyme leak was "type II" secondary to demand ischemia.  No indication for invasive evaluation at this time.  She can have noninvasive coronary evaluation as an outpatient.  Her chest CT did show coronary calcification.  There is no evidence of pulmonary emboli.  She sees Dr. Vaughan Browner as an outpatient for pulmonary.  Lorretta Harp, M.D., [place], [facility], [place] [address] [place], [place]  [id number]  [phone] 01/13/2023 10:48 AM

## 2023-01-13 NOTE — Progress Notes (Signed)
Echocardiogram 2D Echocardiogram has been performed.  Deanna Petty 01/13/2023, 11:12 AM

## 2023-01-13 NOTE — ED Notes (Signed)
Report received assumed care at this time pt resting pillows provided for comfort will continue to monitor

## 2023-01-13 NOTE — Progress Notes (Signed)
ANTICOAGULATION CONSULT NOTE  Pharmacy Consult for heparin  Indication: chest pain/ACS, new onset afib with RVR   No Known Allergies  Patient Measurements: Height: 5\' 2"  (157.5 cm) Weight: 84.4 kg (186 lb 1.1 oz) IBW/kg (Calculated) : 50.1 Heparin Dosing Weight: 69.2kg   Vital Signs: Temp: 98.9 F (37.2 C) (03/29 0707) Temp Source: Oral (03/29 0707) BP: 158/55 (03/29 0758) Pulse Rate: 72 (03/29 0758)  Labs: Recent Labs    01/12/23 1545 01/12/23 1625 01/12/23 1940 01/12/23 2258 01/12/23 2300 01/13/23 0150 01/13/23 0310 01/13/23 0646 01/13/23 0710  HGB 10.2*   < >  --  13.5 14.6 12.7  --   --  13.1  HCT 33.3*   < >  --  44.0 43.0 40.8  --   --  41.7  PLT 115*  --   --  173  --  165  --   --  168  HEPARINUNFRC  --   --   --   --   --   --   --  0.34  --   CREATININE 0.61  --   --  0.97  --   --   --   --   --   CKTOTAL  --   --   --  256*  --   --   --   --   --   TROPONINIHS 20*  --  2,667* 5,742*  --   --  6,255*  --   --    < > = values in this interval not displayed.     Estimated Creatinine Clearance: 50.5 mL/min (by C-G formula based on SCr of 0.97 mg/dL).   Medical History: Past Medical History:  Diagnosis Date   Bronchitis    Cigarette smoker    COPD (chronic obstructive pulmonary disease) (HCC)    Pneumonia    Shortness of breath    with exertion; lasted used inhaler 06/02/13    Assessment: Patient presented with weakness and fatigue. Trop elevation up to 2,667, CBC stable. Noted thrombocytopenia, appears chronic. Not on anticoagulation prior to admission.   Initial heparin level low end therapeutic on 800 units/hr  Goal of Therapy:  Heparin level 0.3-0.7 units/ml Monitor platelets by anticoagulation protocol: Yes   Plan:  Increase heparin gtt slightly to 850 units/hr Daily heparin level, CBC, s/s bleeding F/u long term [facility] plan  Bertis Ruddy, PharmD, [place] Pharmacist ED Pharmacist Phone # [phone] 01/13/2023 8:00 AM

## 2023-01-14 ENCOUNTER — Telehealth (HOSPITAL_BASED_OUTPATIENT_CLINIC_OR_DEPARTMENT_OTHER): Payer: Self-pay | Admitting: Pulmonary Disease

## 2023-01-14 ENCOUNTER — Encounter (HOSPITAL_COMMUNITY): Payer: Self-pay | Admitting: Internal Medicine

## 2023-01-14 DIAGNOSIS — R911 Solitary pulmonary nodule: Secondary | ICD-10-CM

## 2023-01-14 DIAGNOSIS — J441 Chronic obstructive pulmonary disease with (acute) exacerbation: Secondary | ICD-10-CM | POA: Diagnosis not present

## 2023-01-14 DIAGNOSIS — E876 Hypokalemia: Secondary | ICD-10-CM | POA: Diagnosis not present

## 2023-01-14 DIAGNOSIS — I350 Nonrheumatic aortic (valve) stenosis: Secondary | ICD-10-CM | POA: Diagnosis not present

## 2023-01-14 DIAGNOSIS — I214 Non-ST elevation (NSTEMI) myocardial infarction: Secondary | ICD-10-CM | POA: Diagnosis not present

## 2023-01-14 DIAGNOSIS — I480 Paroxysmal atrial fibrillation: Secondary | ICD-10-CM | POA: Diagnosis not present

## 2023-01-14 LAB — GLUCOSE, CAPILLARY
Glucose-Capillary: 188 mg/dL — ABNORMAL HIGH (ref 70–99)
Glucose-Capillary: 163 mg/dL — ABNORMAL HIGH (ref 70–99)
Glucose-Capillary: 154 mg/dL — ABNORMAL HIGH (ref 70–99)
Glucose-Capillary: 215 mg/dL — ABNORMAL HIGH (ref 70–99)
Glucose-Capillary: 266 mg/dL — ABNORMAL HIGH (ref 70–99)
Glucose-Capillary: 213 mg/dL — ABNORMAL HIGH (ref 70–99)

## 2023-01-14 LAB — RENAL FUNCTION PANEL
Albumin: 3.1 g/dL — ABNORMAL LOW (ref 3.5–5.0)
Anion gap: 8 (ref 5–15)
BUN: 17 mg/dL (ref 8–23)
CO2: 31 mmol/L (ref 22–32)
Calcium: 9 mg/dL (ref 8.9–10.3)
Chloride: 97 mmol/L — ABNORMAL LOW (ref 98–111)
Creatinine, Ser: 0.78 mg/dL (ref 0.44–1.00)
GFR, Estimated: >60 mL/min (ref >60)
Glucose, Bld: 191 mg/dL — ABNORMAL HIGH (ref 70–99)
Phosphorus: 3.4 mg/dL (ref 2.5–4.6)
Potassium: 5 mmol/L (ref 3.5–5.1)
Sodium: 136 mmol/L (ref 135–145)

## 2023-01-14 LAB — HEMOGLOBIN A1C
Hgb A1c MFr Bld: 7.4 % — ABNORMAL HIGH (ref 4.8–5.6)
Mean Plasma Glucose: 166 mg/dL

## 2023-01-14 LAB — CBC
HCT: 39.7 % (ref 36.0–46.0)
Hemoglobin: 12.4 g/dL (ref 12.0–15.0)
MCH: 29.9 pg (ref 26.0–34.0)
MCHC: 31.2 g/dL (ref 30.0–36.0)
MCV: 95.7 fL (ref 80.0–100.0)
Platelets: 165 10*3/uL (ref 150–400)
RBC: 4.15 MIL/uL (ref 3.87–5.11)
RDW: 13.1 % (ref 11.5–15.5)
WBC: 14.7 10*3/uL — ABNORMAL HIGH (ref 4.0–10.5)
nRBC: 0 % (ref 0.0–0.2)

## 2023-01-14 LAB — HEPARIN LEVEL (UNFRACTIONATED): Heparin Unfractionated: 0.43 IU/mL (ref 0.30–0.70)

## 2023-01-14 LAB — MAGNESIUM: Magnesium: 1.9 mg/dL (ref 1.7–2.4)

## 2023-01-14 MED ORDER — IPRATROPIUM-ALBUTEROL 0.5-2.5 (3) MG/3ML IN SOLN
3.0000 mL | RESPIRATORY_TRACT | Status: DC | PRN
  Administered 2023-01-15 – 2023-01-16 (×2): 3 mL via RESPIRATORY_TRACT
  Filled 2023-01-14 (×2): qty 3

## 2023-01-14 MED ORDER — ATORVASTATIN CALCIUM 20 MG PO TABS: 20.0000 mg | ORAL_TABLET | Freq: Every day | ORAL | 1 refills | Status: DC

## 2023-01-14 MED ORDER — BUDESONIDE 0.5 MG/2ML IN SUSP
0.5000 mg | Freq: Two times a day (BID) | RESPIRATORY_TRACT | Status: DC
  Administered 2023-01-14 – 2023-01-18 (×8): 0.5 mg via RESPIRATORY_TRACT
  Filled 2023-01-14 (×8): qty 2

## 2023-01-14 MED ORDER — APIXABAN 5 MG PO TABS
5.0000 mg | ORAL_TABLET | Freq: Two times a day (BID) | ORAL | Status: DC
  Administered 2023-01-14 (×2): 5 mg via ORAL
  Filled 2023-01-14 (×3): qty 1

## 2023-01-14 MED ORDER — REVEFENACIN 175 MCG/3ML IN SOLN
175.0000 ug | Freq: Every day | RESPIRATORY_TRACT | Status: DC
  Administered 2023-01-14 – 2023-01-18 (×5): 175 ug via RESPIRATORY_TRACT
  Filled 2023-01-14 (×5): qty 3

## 2023-01-14 MED ORDER — ALBUTEROL SULFATE (2.5 MG/3ML) 0.083% IN NEBU: 2.5000 mg | INHALATION_SOLUTION | Freq: Four times a day (QID) | RESPIRATORY_TRACT | 0 refills | Status: DC | PRN

## 2023-01-14 MED ORDER — NICOTINE POLACRILEX 2 MG MT GUM: 2.0000 mg | CHEWING_GUM | OROMUCOSAL | Status: DC | PRN

## 2023-01-14 MED ORDER — PANTOPRAZOLE SODIUM 40 MG PO TBEC: 40.0000 mg | DELAYED_RELEASE_TABLET | Freq: Every day | ORAL | 0 refills | Status: DC

## 2023-01-14 MED ORDER — PREDNISONE 50 MG PO TABS: 50.0000 mg | ORAL_TABLET | Freq: Every day | ORAL | 0 refills | Status: DC

## 2023-01-14 MED ORDER — METHYLPREDNISOLONE SODIUM SUCC 125 MG IJ SOLR
80.0000 mg | Freq: Every day | INTRAMUSCULAR | Status: DC
  Administered 2023-01-14: 80 mg via INTRAVENOUS
  Filled 2023-01-14: qty 2

## 2023-01-14 MED ORDER — TRELEGY ELLIPTA 100-62.5-25 MCG/ACT IN AEPB: 1.0000 | INHALATION_SPRAY | Freq: Every day | RESPIRATORY_TRACT | 5 refills | Status: AC

## 2023-01-14 MED ORDER — ARFORMOTEROL TARTRATE 15 MCG/2ML IN NEBU
15.0000 ug | INHALATION_SOLUTION | Freq: Two times a day (BID) | RESPIRATORY_TRACT | Status: DC
  Administered 2023-01-14 – 2023-01-18 (×8): 15 ug via RESPIRATORY_TRACT
  Filled 2023-01-14 (×8): qty 2

## 2023-01-14 MED ORDER — APIXABAN 5 MG PO TABS: 5.0000 mg | ORAL_TABLET | Freq: Two times a day (BID) | ORAL | 2 refills | Status: DC

## 2023-01-14 NOTE — Discharge Instructions (Signed)

## 2023-01-14 NOTE — Telephone Encounter (Signed)
Please arrange follow-up with Dr. Vaughan Browner, primary pulmonologist, within the next 1-2 weeks for new lung nodule needing urgent evaluation.Encourage family to come if patient wishes. May need PET if she wishes to be aggressive. Note: Wait time for PET can be up to 3 weeks right now so need sooner appt

## 2023-01-14 NOTE — Progress Notes (Signed)
PROGRESS NOTE  Deanna Petty [id number] DOB: [birth year]   PCP: Pcp, No  Patient is from: Home.  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  In ED, in RVR to 151.  RR 34.  Desaturated to 81% on home 3 L.  VBG with acute on chronic respiratory acidosis.  K2.8.  Mg 1.1.  BNP 140.  Troponin 20 and trended up to 2700.  EKG in A-fib with RVR to 149.  Repeat EKG sinus rhythm with RBBB and TWI in inferolateral leads.  CTA chest negative for PE but enlarging spiculated 1.8 cm nodule in LUL concerning for bronchogenic carcinoma.  Patient converted to sinus rhythm after IV Cardizem push.  Started on IV heparin and COPD meds.  Cardiology consulted.  TTE ordered.  TTE without significant finding.  Troponin elevation felt to be demand ischemia.  Pulmonology evaluated patient and recommended close follow-up outpatient for LUL mass.   Subjective: Seen and examined earlier this morning.  No major events overnight of this morning.  Feels better this morning.  However, she required 6 L to maintain saturation above 88% with ambulation.  She was very symptomatic.   Objective: Vitals:   01/14/23 0830 01/14/23 1013 01/14/23 1204 01/14/23 1336  BP:   124/87   Pulse: 81  64   Resp: 18  19   Temp:   97.7 F (36.5 C)   TempSrc:   Oral   SpO2: 96% (!) 83% 95% 92%  Weight:      Height:        Examination:  GENERAL: No apparent distress.  Nontoxic. HEENT: MMM.  Vision and hearing grossly intact.  NECK: Supple.  No apparent JVD.  RESP:  No IWOB.  Rhonchi bilaterally. CVS:  RRR. Heart  sounds normal.  ABD/GI/GU: BS+. Abd soft, NTND.  MSK/EXT:   No apparent deformity. Moves extremities. No edema.  SKIN: no apparent skin lesion or wound NEURO: Awake and alert. Oriented appropriately.  No apparent focal neuro deficit. PSYCH: Calm. Normal affect.   Procedures:  None  Microbiology summarized: [id number], influenza and RSV PCR nonreactive  Assessment and plan: Principal Problem:   COPD with acute exacerbation ([place]) Active Problems:   Tobacco use   Type 2 diabetes mellitus with hyperlipidemia (HCC)   Elevated brain natriuretic peptide (BNP) level   Acute on chronic respiratory failure with hypoxia and hypercapnia (HCC)   Essential hypertension   Hypomagnesemia   New onset atrial fibrillation (HCC)   Myocardial infarction due to demand ischemia (HCC)   Elevated troponin   Hypokalemia  New onset A-fib with RVR: Converted to sinus rhythm after IV Cardizem push in ED.  Remains in sinus rhythm.  Could be due to COPD exacerbation and electrolyte derangement.  TTE without significant finding.  CHA2DS2-VASc score > 5 -Cardiology on board -Continue Zebeta -Changed heparin to Eliquis -Optimize electrolytes   COPD exacerbation: Likely due to ongoing cigarette smoking.  Lifetime smoker.  Reports good compliance with Trelegy.  Improved. -Continue IV Solu-Medrol and ceftriaxone -Brovana, Pulmicort and Yupelri -Change DuoNebs to as needed -Discussed the importance  of smoking cessation  Acute on chronic respiratory failure with hypoxia and hypercapnia: On 3 L at baseline.  Required 6 L to maintain saturation above 88% with ambulation -Minimum oxygen to keep saturation above 88% -Incentive spirometry/OOB/PT/OT -Daily ambulatory saturation  Non-STEMI: Patient without chest pain but the DOE.  Troponin peaked at 6000 and started to trend down.  EKG with TWI in inferolateral leads but not new.  TTE without significant finding.  Felt to be demand ischemia but cannot rule out ACS.   LDL 32.  A1c 7.4%. -Cardiology on board-continue Zebeta, aspirin and Lipitor -Changed IV heparin to Eliquis  Lung nodule: CT angio showed 1.8 cm enlarging and spiculated LUL nodule concerning for bronchogenic carcinoma.  Followed by Dr. Kimber Relic. -PCCM consulted and recommended close follow-up outpatient.  Hypokalemia/hypomagnesemia: Resolved -Monitor replenish as appropriate  NIDDM-2 with hyperglycemia and hyperlipidemia: A1c is 1.4%.  On metformin at home. Recent Labs  Lab 01/13/23 2126 01/14/23 0120 01/14/23 0443 01/14/23 0734 01/14/23 1201  GLUCAP 235* 188* 163* 154* 215*  -Increased SSI to moderate -Start Semglee 10 units daily -Continue statin  Tobacco use disorder: Lifetime smoker.  Over 100-pack-year history -Encouraged smoking cessation -Likes to try nicotine gums.  Reports skin irritation with nicotine patch  Essential hypertension: Normotensive. -Continue Zebeta for now -Continue holding amlodipine  Physical deconditioning: Uses cane at baseline -PT/OT  Obesity Body mass index is 34.03 kg/m. Nutrition Problem: Predicted suboptimal nutrient intake Etiology:  (hospitalization) Signs/Symptoms:  (recent COPD exacerbation and lung nodule) Interventions: Education   DVT prophylaxis:  SCDs Start: 01/13/23 0659 apixaban (ELIQUIS) tablet 5 mg  Code Status: DNR/DNI Family Communication: None at bedside Level of care: Progressive Status is: Inpatient Remains inpatient appropriate because: Respiratory failure, non-STEMI, A-fib and COPD exacerbation   Final disposition: Home Consultants:  Cardiology PCCM  55 minutes with more than 50% spent in reviewing records, counseling patient/family and coordinating care.   Sch Meds:  Scheduled Meds:  apixaban  5 mg Oral BID   arformoterol  15 mcg Nebulization BID   aspirin EC  81 mg Oral Daily   atorvastatin  10 mg Oral QHS   bisoprolol  5 mg Oral Daily   budesonide (PULMICORT) nebulizer solution  0.5 mg  Nebulization BID   guaiFENesin  600 mg Oral BID   insulin aspart  0-9 Units Subcutaneous Q4H   methylPREDNISolone (SOLU-MEDROL) injection  80 mg Intravenous Daily   pantoprazole  40 mg Oral Daily   revefenacin  175 mcg Nebulization Daily   Continuous Infusions:  cefTRIAXone (ROCEPHIN)  IV Stopped (01/14/23 0629)   PRN Meds:.acetaminophen **OR** acetaminophen, fentaNYL (SUBLIMAZE) injection, HYDROcodone-acetaminophen, ipratropium-albuterol, ondansetron **OR** ondansetron (ZOFRAN) IV  Antimicrobials: Anti-infectives (From admission, onward)    Start     Dose/Rate Route Frequency Ordered Stop   01/13/23 0658  cefTRIAXone (ROCEPHIN) 1 g in sodium chloride 0.9 % 100 mL IVPB        1 g 200 mL/hr over 30 Minutes Intravenous Every 24 hours 01/13/23 0658 01/18/23 0659        I have personally reviewed the following labs and images: CBC: Recent Labs  Lab 01/12/23 1545 01/12/23 1625 01/12/23 2258 01/12/23 2300 01/13/23 0150 01/13/23 0710 01/14/23 0231  WBC 7.0  --  8.4  --  8.4 10.0 14.7*  NEUTROABS 4.9  --  7.7  --   --   --   --   HGB 10.2*   < > 13.5 14.6 12.7 13.1 12.4  HCT 33.3*   < > 44.0  43.0 40.8 41.7 39.7  MCV 99.4  --  97.8  --  96.9 96.1 95.7  PLT 115*  --  173  --  165 168 165   < > = values in this interval not displayed.   BMP &GFR Recent Labs  Lab 01/12/23 1545 01/12/23 1625 01/12/23 2258 01/12/23 2300 01/13/23 0710 01/14/23 0231  NA 144 141 136 137 138 136  K 2.8* 4.4 5.4* 5.3* 4.8 5.0  CL 111  --  92*  --  98 97*  CO2 25  --  33*  --  32 31  GLUCOSE 149*  --  307*  --  175* 191*  BUN 11  --  15  --  14 17  CREATININE 0.61  --  0.97  --  0.72 0.78  CALCIUM 6.2*  --  9.2  --  8.8* 9.0  MG 1.1*  --  2.1  --  2.0 1.9  PHOS  --   --  4.3  --  3.5 3.4   Estimated Creatinine Clearance: 61.2 mL/min (by C-G formula based on SCr of 0.78 mg/dL). Liver & Pancreas: Recent Labs  Lab 01/12/23 1545 01/13/23 0710 01/14/23 0231  AST 18 47*  --   ALT 18 26   --   ALKPHOS 46 63  --   BILITOT 0.4 0.5  --   PROT 3.9* 5.9*  --   ALBUMIN 1.9* 2.8* 3.1*   No results for input(s): "LIPASE", "AMYLASE" in the last 168 hours. No results for input(s): "AMMONIA" in the last 168 hours. Diabetic: Recent Labs    01/12/23 2258  HGBA1C 7.4*   Recent Labs  Lab 01/13/23 2126 01/14/23 0120 01/14/23 0443 01/14/23 0734 01/14/23 1201  GLUCAP 235* 188* 163* 154* 215*   Cardiac Enzymes: Recent Labs  Lab 01/12/23 2258  CKTOTAL 256*   No results for input(s): "PROBNP" in the last 8760 hours. Coagulation Profile: No results for input(s): "INR", "PROTIME" in the last 168 hours. Thyroid Function Tests: Recent Labs    01/12/23 2258  TSH 0.573   Lipid Profile: Recent Labs    01/13/23 0150  CHOL 92  HDL 51  LDLCALC 32  TRIG 46  CHOLHDL 1.8   Anemia Panel: Recent Labs    01/13/23 1005 01/13/23 1006 01/13/23 1234  VITAMINB12 566  --   --   FOLATE  --  21.0  --   FERRITIN 87  --   --   TIBC 354  --   --   IRON 84  --   --   RETICCTPCT  --   --  1.3   Urine analysis:    Component Value Date/Time   COLORURINE YELLOW 01/12/2023 2255   APPEARANCEUR HAZY (A) 01/12/2023 2255   LABSPEC >1.046 (H) 01/12/2023 2255   PHURINE 6.0 01/12/2023 2255   GLUCOSEU 50 (A) 01/12/2023 2255   HGBUR SMALL (A) 01/12/2023 2255   BILIRUBINUR NEGATIVE 01/12/2023 2255   KETONESUR NEGATIVE 01/12/2023 2255   PROTEINUR 100 (A) 01/12/2023 2255   NITRITE NEGATIVE 01/12/2023 2255   LEUKOCYTESUR NEGATIVE 01/12/2023 2255   Sepsis Labs: Invalid input(s): "PROCALCITONIN", "LACTICIDVEN"  Microbiology: Recent Results (from the past 240 hour(s))  Resp panel by RT-PCR (RSV, Flu A&B, Covid) Anterior Nasal Swab     Status: None   Collection Time: 01/12/23  3:40 PM   Specimen: Anterior Nasal Swab  Result Value Ref Range Status   SARS Coronavirus 2 by RT PCR NEGATIVE NEGATIVE Final   Influenza A by  PCR NEGATIVE NEGATIVE Final   Influenza B by PCR NEGATIVE NEGATIVE  Final    Comment: (NOTE) The Xpert Xpress SARS-CoV-2/FLU/RSV plus assay is intended as an aid in the diagnosis of influenza from Nasopharyngeal swab specimens and should not be used as a sole basis for treatment. Nasal washings and aspirates are unacceptable for Xpert Xpress SARS-CoV-2/FLU/RSV testing.  Fact Sheet for Patients: EntrepreneurPulse.com.au  Fact Sheet for Healthcare Providers: IncredibleEmployment.be  This test is not yet approved or cleared by the [place] FDA and has been authorized for detection and/or diagnosis of SARS-CoV-2 by FDA under an Emergency Use Authorization (EUA). This EUA will remain in effect (meaning this test can be used) for the duration of the COVID-19 declaration under Section 564(b)(1) of the Act, 21 U.S.C. section 360bbb-3(b)(1), unless the authorization is terminated or revoked.     Resp Syncytial Virus by PCR NEGATIVE NEGATIVE Final    Comment: (NOTE) Fact Sheet for Patients: EntrepreneurPulse.com.au  Fact Sheet for Healthcare Providers: IncredibleEmployment.be  This test is not yet approved or cleared by the [place] FDA and has been authorized for detection and/or diagnosis of SARS-CoV-2 by FDA under an Emergency Use Authorization (EUA). This EUA will remain in effect (meaning this test can be used) for the duration of the COVID-19 declaration under Section 564(b)(1) of the Act, 21 U.S.C. section 360bbb-3(b)(1), unless the authorization is terminated or revoked.  Performed at [facility], [place] [address] [place], [place] [address]     Radiology Studies: No results found.    Emani Morad T. [place]  If 7PM-7AM, please contact night-coverage [email or web address] 01/14/2023, 2:11 PM

## 2023-01-14 NOTE — Evaluation (Signed)
Occupational Therapy Evaluation Patient Details Name: Deanna Petty MRN: [id number] DOB: Jun 18, 1946 Today's Date: 01/14/2023   History of Present Illness 77 year old admitted for SOB and new onset Afib, COPD exacerbation and NSTEMI.  W/U also revealed nodule in LUL concerning for bronchogenic carcinoma.  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   Clinical Impression   PTA, pt lived with a roommate who assisted with home management and supervised showers. Upon eval, pt presents with decreased activity tolerance and balance. Pt performing ADL with up to supervision. On 4L O2 on entry via nasal cannula and desatting to 82% SpO2 with functional mobility to sink and ~2 minutes to recover to ~90%. 6L during additional functional mobility into hall and maintaining >88%. Pt reports 88% is her baseline with movement. Recommending discharge home with no OT follow up at this time.     Recommendations for follow up therapy are one component of a multi-disciplinary discharge planning process, led by the attending physician.  Recommendations may be updated based on patient status, additional functional criteria and insurance authorization.   Assistance Recommended at Discharge Intermittent Supervision/Assistance  Patient can return home with the following A little help with walking and/or transfers;A little help with bathing/dressing/bathroom;Assistance with cooking/housework;Assist for transportation;Help with stairs or ramp for entrance    Functional Status Assessment  Patient has had a recent decline in their functional status and demonstrates the ability to make significant improvements in function in a reasonable and predictable amount of time.  Equipment Recommendations  None recommended by OT    Recommendations for Other Services PT consult     Precautions / Restrictions Precautions Precautions:  Fall Restrictions Weight Bearing Restrictions: No      Mobility Bed Mobility               General bed mobility comments: EOB on arrival and departure    Transfers Overall transfer level: Needs assistance Equipment used: Quad cane Transfers: Sit to/from Stand Sit to Stand: Modified independent (Device/Increase time)           General transfer comment: for STS transfer. Increased time. No LOB, performed 2x      Balance Overall balance assessment: Mild deficits observed, not formally tested                                         ADL either performed or assessed with clinical judgement   ADL Overall ADL's : Needs assistance/impaired Eating/Feeding: Independent;Sitting   Grooming: Supervision/safety;Standing;Wash/dry face Grooming Details (indicate cue type and reason): distant supervision Upper Body Bathing: Set up;Sitting   Lower Body Bathing: Supervison/ safety;Sit to/from stand   Upper Body Dressing : Sitting;Set up   Lower Body Dressing: Supervision/safety;Sit to/from stand   Toilet Transfer: Supervision/safety;Ambulation (cane)   Toileting- Clothing Manipulation and Hygiene: Modified independent;Sitting/lateral lean   Tub/ Shower Transfer: Tub transfer;Supervision/safety;Tub bench;Ambulation Tub/Shower Transfer Details (indicate cue type and reason): Pt reporting someone is always home when she bathes Functional mobility during ADLs: Supervision/safety;Cane General ADL Comments: Intermittent desatting during session on 4L when OOB, but when bumped up to 6L during mobility, mainatining around 88SpO2 which is what she reports she is usually at when home.     Vision Ability to See in Adequate Light: 0 Adequate Patient Visual Report: No change from baseline Vision Assessment?: No apparent visual deficits  Perception Perception Perception Tested?: No   Praxis Praxis Praxis tested?: Within functional limits    Pertinent  Vitals/Pain       Hand Dominance Right   Extremity/Trunk Assessment Upper Extremity Assessment Upper Extremity Assessment: Generalized weakness   Lower Extremity Assessment Lower Extremity Assessment: Defer to PT evaluation       Communication Communication Communication: No difficulties   Cognition                                             General Comments  SpO2 of 83% with mobility to sink on 4L; Icreased time to recover. on 6L in hall and SpO2 >88%, which pt reports is baseline. Pt additionally reports she wears 5L when mobilizing normally.    Exercises     Shoulder Instructions      Home Living Family/patient expects to be discharged to:: Private residence Living Arrangements: Non-relatives/Friends (friend) Available Help at Discharge: Available PRN/intermittently;Friend(s);Family Type of Home: House Home Access: Stairs to enter [employer] of Steps: 5-6 Entrance Stairs-Rails: Right;Left Home Layout: One level     Bathroom Shower/Tub: Teacher, early years/pre: Handicapped height (has both)     Home Equipment: Tub bench;Cane - quad          Prior Functioning/Environment Prior Level of Function : Needs assist             Mobility Comments: ambulatory with cane ADLs Comments: assist with IADL/home management, supervision for shower        OT Problem List: Decreased strength;Decreased activity tolerance;Impaired balance (sitting and/or standing);Cardiopulmonary status limiting activity      OT Treatment/Interventions:      OT Goals(Current goals can be found in the care plan section) Acute Rehab OT Goals Patient Stated Goal: go home OT Goal Formulation: With patient  OT Frequency:      Co-evaluation              AM-PAC OT "6 Clicks" Daily Activity     Outcome Measure Help from another person eating meals?: None Help from another person taking care of personal grooming?: A Little Help from  another person toileting, which includes using toliet, bedpan, or urinal?: A Little Help from another person bathing (including washing, rinsing, drying)?: A Little Help from another person to put on and taking off regular upper body clothing?: A Little Help from another person to put on and taking off regular lower body clothing?: A Little 6 Click Score: 19   End of Session Equipment Utilized During Treatment: Gait belt;Oxygen (4-6L, quad cane) Nurse Communication: Mobility status  Activity Tolerance: Patient tolerated treatment well Patient left: in bed;with call bell/phone within reach;Other (comment);with nursing/sitter in room (PT in room)  OT Visit Diagnosis: Unsteadiness on feet (R26.81);Muscle weakness (generalized) (M62.81);Other abnormalities of gait and mobility (R26.89)                Time: [id number] OT Time Calculation (min): 27 min Charges:  OT General Charges $OT Visit: 1 Visit OT Evaluation $OT Eval Low Complexity: 1 Low OT Treatments $Self Care/Home Management : 8-22 mins  Elder Cyphers, OTR/L [facility] Acute Rehabilitation Office: [phone]   Magnus Ivan 01/14/2023, 10:57 AM

## 2023-01-14 NOTE — Progress Notes (Signed)
Patient shows no signs of respiratory distress at  this time.

## 2023-01-14 NOTE — Progress Notes (Signed)
Progress Note  Patient Name: Deanna Petty Date of Encounter: 01/14/2023  Primary Cardiologist: None  Subjective   No acute events overnight. Denies any symptoms of chest pain but continues to have SOB which she says is at baseline.Increase nasal: Oxygen from 3L to 4L due to persistent hypoxia of 85% during my interview.   Inpatient Medications    Scheduled Meds:  apixaban  5 mg Oral BID   aspirin EC  81 mg Oral Daily   atorvastatin  10 mg Oral QHS   bisoprolol  5 mg Oral Daily   guaiFENesin  600 mg Oral BID   insulin aspart  0-9 Units Subcutaneous Q4H   ipratropium-albuterol  3 mL Nebulization Q6H   pantoprazole  40 mg Oral Daily   predniSONE  40 mg Oral Q breakfast   Continuous Infusions:  cefTRIAXone (ROCEPHIN)  IV Stopped (01/14/23 0629)   heparin Stopped (01/14/23 0954)   PRN Meds: acetaminophen **OR** acetaminophen, albuterol, fentaNYL (SUBLIMAZE) injection, HYDROcodone-acetaminophen, ondansetron **OR** ondansetron (ZOFRAN) IV   Vital Signs    Vitals:   01/14/23 0434 01/14/23 0736 01/14/23 0800 01/14/23 0830  BP: (!) 148/53 (!) 159/80    Pulse: 74 70 89   Resp: 20 17    Temp: 97.7 F (36.5 C) 97.7 F (36.5 C)    TempSrc: Oral Oral    SpO2: 93% 92% (!) 86% 96%  Weight:      Height:        Intake/Output Summary (Last 24 hours) at 01/14/2023 0956 Last data filed at 01/14/2023 0800 Gross per 24 hour  Intake 3168.34 ml  Output 1550 ml  Net 1618.34 ml   Filed Weights   01/12/23 2100  Weight: 84.4 kg    Telemetry     Personally reviewed, rate is controlled  ECG    EKG not performed today  Physical Exam   GEN: No acute distress.   Neck: No JVD. Cardiac: RRR, no murmur, rub, or gallop.  Respiratory: Nonlabored. Clear to auscultation bilaterally. GI: Soft, nontender, bowel sounds present. MS: No edema; No deformity. Neuro:  Nonfocal. Psych: Alert and oriented x 3. Normal affect.  Labs    Chemistry Recent Labs  Lab 01/12/23 1545  01/12/23 1625 01/12/23 2258 01/12/23 2300 01/13/23 0710 01/14/23 0231  NA 144   < > 136 137 138 136  K 2.8*   < > 5.4* 5.3* 4.8 5.0  CL 111  --  92*  --  98 97*  CO2 25  --  33*  --  32 31  GLUCOSE 149*  --  307*  --  175* 191*  BUN 11  --  15  --  14 17  CREATININE 0.61  --  0.97  --  0.72 0.78  CALCIUM 6.2*  --  9.2  --  8.8* 9.0  PROT 3.9*  --   --   --  5.9*  --   ALBUMIN 1.9*  --   --   --  2.8* 3.1*  AST 18  --   --   --  47*  --   ALT 18  --   --   --  26  --   ALKPHOS 46  --   --   --  63  --   BILITOT 0.4  --   --   --  0.5  --   GFRNONAA >60  --  >60  --  >60 >60  ANIONGAP 8  --  11  --  8 8   < > = values in this interval not displayed.     Hematology Recent Labs  Lab 01/13/23 0150 01/13/23 0710 01/13/23 1234 01/14/23 0231  WBC 8.4 10.0  --  14.7*  RBC 4.21 4.34 4.51 4.15  HGB 12.7 13.1  --  12.4  HCT 40.8 41.7  --  39.7  MCV 96.9 96.1  --  95.7  MCH 30.2 30.2  --  29.9  MCHC 31.1 31.4  --  31.2  RDW 12.7 12.6  --  13.1  PLT 165 168  --  165    Cardiac Enzymes Recent Labs  Lab 01/12/23 2258 01/13/23 0310 01/13/23 0710 01/13/23 1010 01/13/23 1234  TROPONINIHS 5,742* 6,255* 4,959* 4,191* 2,071*    BNP Recent Labs  Lab 01/12/23 1545  BNP 140.7*     DDimerNo results for input(s): "DDIMER" in the last 168 hours.   Radiology    ECHOCARDIOGRAM COMPLETE  Result Date: 01/13/2023    ECHOCARDIOGRAM REPORT   Patient Name:   Deanna Petty Date of Exam: 01/13/2023 Medical Rec #:  [id number]     Height:       62.0 in Accession #:    [id number]    Weight:       186.1 lb Date of Birth:  June 21, 1946    BSA:          1.854 m Patient Age:    77 years      BP:           143/55 mmHg Patient Gender: F             HR:           73 bpm. Exam Location:  Inpatient Procedure: 2D Echo, Cardiac Doppler, Color Doppler and Intracardiac            Opacification Agent Indications:    elevated troponin  History:        Patient has prior history of Echocardiogram examinations.  COPD.  Sonographer:    Phineas Douglas Referring Phys: [id number] [place]  1. Left ventricular ejection fraction, by estimation, is 60 to 65%. The left ventricle has normal function. The left ventricle has no regional wall motion abnormalities. There is mild concentric left ventricular hypertrophy. Left ventricular diastolic parameters are consistent with Grade I diastolic dysfunction (impaired relaxation).  2. Right ventricular systolic function is normal. The right ventricular size is normal. Tricuspid regurgitation signal is inadequate for assessing PA pressure.  3. No evidence of mitral valve regurgitation.  4. There is moderate calcification of the aortic valve. Aortic valve regurgitation is not visualized. Mild aortic valve stenosis.  5. The inferior vena cava is dilated in size with >50% respiratory variability, suggesting right atrial pressure of 8 mmHg. FINDINGS  Left Ventricle: Left ventricular ejection fraction, by estimation, is 60 to 65%. The left ventricle has normal function. The left ventricle has no regional wall motion abnormalities. Definity contrast agent was given IV to delineate the left ventricular  endocardial borders. The left ventricular internal cavity size was normal in size. There is mild concentric left ventricular hypertrophy. Left ventricular diastolic parameters are consistent with Grade I diastolic dysfunction (impaired relaxation). Right Ventricle: The right ventricular size is normal. Right ventricular systolic function is normal. Tricuspid regurgitation signal is inadequate for assessing PA pressure. Left Atrium: Left atrial size was normal in size. Right Atrium: Right atrial size was normal in size. Pericardium: There is no evidence of pericardial effusion. Mitral Valve: No evidence  of mitral valve regurgitation. Tricuspid Valve: Tricuspid valve regurgitation is not demonstrated. Aortic Valve: There is moderate calcification of the aortic valve. Aortic valve  regurgitation is not visualized. Mild aortic stenosis is present. Aortic valve mean gradient measures 12.3 mmHg. Aortic valve peak gradient measures 19.0 mmHg. Aortic valve area, by VTI measures 1.26 cm. Pulmonic Valve: Pulmonic valve regurgitation is not visualized. Aorta: The aortic root and ascending aorta are structurally normal, with no evidence of dilitation. Venous: The inferior vena cava is dilated in size with greater than 50% respiratory variability, suggesting right atrial pressure of 8 mmHg. IAS/Shunts: No atrial level shunt detected by color flow Doppler.  LEFT VENTRICLE PLAX 2D LVIDd:         4.00 cm     Diastology LVIDs:         2.00 cm     LV e' medial:    6.09 cm/s LV PW:         1.30 cm     LV E/e' medial:  19.5 LV IVS:        1.20 cm     LV e' lateral:   8.70 cm/s LVOT diam:     1.80 cm     LV E/e' lateral: 13.7 LV SV:         67 LV SV Index:   36 LVOT Area:     2.54 cm  LV Volumes (MOD) LV vol d, MOD A2C: 88.4 ml LV vol d, MOD A4C: 82.3 ml LV vol s, MOD A2C: 22.6 ml LV vol s, MOD A4C: 19.1 ml LV SV MOD A2C:     65.8 ml LV SV MOD A4C:     82.3 ml LV SV MOD BP:      64.4 ml RIGHT VENTRICLE             IVC RV Basal diam:  3.40 cm     IVC diam: 2.40 cm RV S prime:     11.50 cm/s TAPSE (M-mode): 1.5 cm LEFT ATRIUM             Index        RIGHT ATRIUM           Index LA diam:        4.20 cm 2.27 cm/m   RA Area:     13.40 cm LA Vol (A2C):   54.1 ml 29.18 ml/m  RA Volume:   29.30 ml  15.80 ml/m LA Vol (A4C):   51.0 ml 27.51 ml/m LA Biplane Vol: 55.9 ml 30.15 ml/m  AORTIC VALVE AV Area (Vmax):    1.25 cm AV Area (Vmean):   1.12 cm AV Area (VTI):     1.26 cm AV Vmax:           217.67 cm/s AV Vmean:          170.667 cm/s AV VTI:            0.531 m AV Peak Grad:      19.0 mmHg AV Mean Grad:      12.3 mmHg LVOT Vmax:         107.00 cm/s LVOT Vmean:        75.200 cm/s LVOT VTI:          0.263 m LVOT/AV VTI ratio: 0.49  AORTA Ao Root diam: 2.80 cm Ao Asc diam:  2.50 cm MITRAL VALVE MV Area (PHT):  3.19 cm     SHUNTS MV Decel Time: 238 msec  Systemic VTI:  0.26 m MV E velocity: 119.00 cm/s  Systemic Diam: 1.80 cm MV A velocity: 140.00 cm/s MV E/A ratio:  0.85 Phineas Inches Electronically signed by Phineas Inches Signature Date/Time: 01/13/2023/11:25:37 AM    Final    CT Angio Chest PE W and/or Wo Contrast  Result Date: 01/12/2023 CLINICAL DATA:  Pulmonary embolus suspected with high probability. Weakness and nausea. EXAM: CT ANGIOGRAPHY CHEST WITH CONTRAST TECHNIQUE: Multidetector CT imaging of the chest was performed using the standard protocol during bolus administration of intravenous contrast. Multiplanar CT image reconstructions and MIPs were obtained to evaluate the vascular anatomy. RADIATION DOSE REDUCTION: This exam was performed according to the departmental dose-optimization program which includes automated exposure control, adjustment of the mA and/or kV according to patient size and/or use of iterative reconstruction technique. CONTRAST:  69mL OMNIPAQUE IOHEXOL 350 MG/ML SOLN COMPARISON:  09/18/2021 FINDINGS: Cardiovascular: Good opacification of the central and segmental pulmonary arteries representing technically adequate study. No focal filling defects. No evidence of significant pulmonary embolus. Normal heart size. No pericardial effusions. Calcification of the mitral valve annulus, coronary arteries, and aorta. No aortic aneurysm or dissection. Mediastinum/Nodes: Esophagus is decompressed with minimal esophageal hiatal hernia. Thyroid gland is unremarkable. No significant lymphadenopathy. Lungs/Pleura: Motion artifact limits examination. Mildly spiculated nodule in the superior segment of the left upper lung measuring 1.5 x 1.8 cm diameter. This is enlarged since the previous study, at which time it measured 4 mm diameter. 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 Pulmonary consultation is suggested. No focal consolidation or  airspace disease. No pleural effusions. No pneumothorax. Mild atelectasis in the lung bases. Upper Abdomen: Cholelithiasis.  No acute Musculoskeletal: Abnormalities. Degenerative changes in the spine. No destructive bone lesions. Review of the MIP images confirms the above findings. IMPRESSION: 1. No evidence of significant pulmonary embolus. 2. 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 Pulmonary consultation is suggested. 3. Aortic atherosclerosis. Electronically Signed   By: Lucienne Capers M.D.   On: 01/12/2023 20:24   DG Chest Portable 1 View  Result Date: 01/12/2023 CLINICAL DATA:  Shortness of breath EXAM: PORTABLE CHEST 1 VIEW COMPARISON:  01/17/22 FINDINGS: Atherosclerotic calcification of the aortic arch. Borderline enlargement of the cardiopericardial silhouette. Mild interstitial accentuation, as can commonly be encountered in smokers. No edema or airspace opacity. No blunting of the costophrenic angles. IMPRESSION: 1. Borderline enlargement of the cardiopericardial silhouette, without edema. 2. Mild interstitial accentuation, as can commonly be encountered in smokers. 3.  Aortic Atherosclerosis (ICD10-I70.0). Electronically Signed   By: Van Clines M.D.   On: 01/12/2023 16:26    Cardiac Studies   Echocardiogram on 12/2008/24 LVEF normal Mild aortic valve stenosis   Assessment & Plan   Patient is a 77 year old F known to have HTN, DM 2, tobacco abuse, 1.8 cm lung nodule, COPD is currently admitted to hospitalist team for the management of acute on chronic hypoxic respiratory failure secondary to COPD exacerbation versus HFpEF exacerbation and new onset of A-fib with RVR.  Cardiology was consulted for elevated troponins.  # NSTEMI -EKG showed diffuse T wave inversions since 2021 (although these EKGs were performed during ER/hospital admissions), Hs troponins 2,667>>5742>>6255>>4959>>4191. Patient denies having  chest pain but has been having DOE which is an anginal equivalent. Due to risk factors of HTN, DM 2, longstanding tobacco abuse history and significantly elevated troponins in the current hospitalization, significant CAD cannot be ruled out.  She will benefit  from inpatient invasive ischemia evaluation however patient would like to think about it, discuss with her family and make a decision.  She understands that DNR status has to be reversed for LHC procedure.  Diagnostic coronary angiogram would be reasonable at this point. If she needs PCI, will need to have discussion with the pulmonology about the urgency of the biopsy. But patient wants to be discharged home and follow-up with her outpatient pulmonology about further discussions of the lung nodule. -Strongly recommend to continue ACS protocol for total duration of at least 48 hours, if not longer. It would be ideal to continue for 5 days but patient does not have to stay in the hospital only to be on heparin drip. -Outpatient cardiology follow-up  # Paroxysmal A-fib with RVR, rate controlled -Continue bisoprolol 5 mg once daily -Continue heparin drip (switch to Eliquis upon discharge)  # Mild aortic valve stenosis on admission: Outpatient echo surveillance   I have spent a total of 30 minutes with patient reviewing chart , telemetry, EKGs, labs and examining patient as well as establishing an assessment and plan that was discussed with the patient.  > 50% of time was spent in direct patient care.    Signed, Chalmers Guest, MD  01/14/2023, 9:56 AM

## 2023-01-14 NOTE — Evaluation (Signed)
Physical Therapy Evaluation Patient Details Name: STARLEIGH RENNIE MRN: [id number] DOB: Jan 11, 1946 Today's Date: 01/14/2023  History of Present Illness  77 year old admitted for SOB and new onset Afib, COPD exacerbation and NSTEMI.  W/U also revealed nodule in LUL concerning for bronchogenic carcinoma.  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  Clinical Impression  Patient presents essentially at baseline with slightly decreased endurance.  Patient distant supervision for mobility.  Anticipate patient will continue to improve as she returns home.  No further PT recommended.  Will ask mobility specialist to see while in hospital.  PT to sign off.         Recommendations for follow up therapy are one component of a multi-disciplinary discharge planning process, led by the attending physician.  Recommendations may be updated based on patient status, additional functional criteria and insurance authorization.  Follow Up Recommendations       Assistance Recommended at Discharge Intermittent Supervision/Assistance  Patient can return home with the following  Assistance with cooking/housework    Equipment Recommendations None recommended by PT  Recommendations for Other Services       Functional Status Assessment Patient has had a recent decline in their functional status and demonstrates the ability to make significant improvements in function in a reasonable and predictable amount of time.     Precautions / Restrictions Precautions Precautions: Fall Restrictions Weight Bearing Restrictions: No      Mobility  Bed Mobility               General bed mobility comments: EOB on arrival and departure    Transfers Overall transfer level: Needs assistance Equipment used: Quad cane Transfers: Sit to/from Stand Sit to Stand: Modified independent (Device/Increase time)                 Ambulation/Gait Ambulation/Gait assistance: Supervision Gait Distance (Feet): 60 Feet Assistive device: Quad cane Gait Pattern/deviations: WFL(Within Functional Limits), Decreased stride length Gait velocity: decreased        Stairs            Wheelchair Mobility    Modified Rankin (Stroke Patients Only)       Balance Overall balance assessment: Needs assistance Sitting-balance support: No upper extremity supported, Feet supported Sitting balance-Leahy Scale: Good     Standing balance support: Single extremity supported, During functional activity, Reliant on assistive device for balance Standing balance-Leahy Scale: Poor                               Pertinent Vitals/Pain Pain Assessment Pain Assessment: No/denies pain    Home Living Family/patient expects to be discharged to:: Private residence Living Arrangements: Non-relatives/Friends (friend) Available Help at Discharge: Available PRN/intermittently;Friend(s);Family Type of Home: House Home Access: Stairs to enter Entrance Stairs-Rails: Psychiatric nurse of Steps: 5-6   Home Layout: One level Home Equipment: Tub bench;Cane - quad      Prior Function Prior Level of Function : Needs assist             Mobility Comments: ambulatory with cane ADLs Comments: assist with IADL/home management, supervision for shower     Hand Dominance   Dominant Hand: Right    Extremity/Trunk Assessment   Upper Extremity Assessment Upper Extremity Assessment: Generalized weakness    Lower Extremity Assessment Lower Extremity Assessment: Generalized weakness    Cervical / Trunk Assessment Cervical /  Trunk Assessment: Kyphotic  Communication   Communication: No difficulties  Cognition Arousal/Alertness: Awake/alert Behavior During Therapy: WFL for tasks assessed/performed Overall Cognitive Status: Within Functional Limits for tasks assessed                                           General Comments General comments (skin integrity, edema, etc.): SpO2 of 83% with mobility to sink on 4L; Icreased time to recover. on 6L in hall and SpO2 >88%, which pt reports is baseline. Pt additionally reports she wears 5L when mobilizing normally.    Exercises     Assessment/Plan    PT Assessment Patient does not need any further PT services  PT Problem List         PT Treatment Interventions      PT Goals (Current goals can be found in the Care Plan section)  Acute Rehab PT Goals Patient Stated Goal: get back home PT Goal Formulation: All assessment and education complete, DC therapy    Frequency       Co-evaluation               AM-PAC PT "6 Clicks" Mobility  Outcome Measure Help needed turning from your back to your side while in a flat bed without using bedrails?: A Little Help needed moving from lying on your back to sitting on the side of a flat bed without using bedrails?: A Little Help needed moving to and from a bed to a chair (including a wheelchair)?: A Little Help needed standing up from a chair using your arms (e.g., wheelchair or bedside chair)?: A Little Help needed to walk in hospital room?: A Little Help needed climbing 3-5 steps with a railing? : A Little 6 Click Score: 18    End of Session Equipment Utilized During Treatment: Gait belt;Oxygen Activity Tolerance: Patient tolerated treatment well Patient left: in bed;with call bell/phone within reach (sitting EOB)   PT Visit Diagnosis: Other abnormalities of gait and mobility (R26.89);Muscle weakness (generalized) (M62.81)    Time: [id number] PT Time Calculation (min) (ACUTE ONLY): 22 min   Charges:   PT Evaluation $PT Eval Moderate Complexity: 1 Mod          01/14/2023 Margie, PT Acute Rehabilitation Services Office:  [phone]   Shanna Cisco 01/14/2023, 11:44 AM

## 2023-01-14 NOTE — Progress Notes (Signed)
SATURATION QUALIFICATIONS: (This note is used to comply with regulatory documentation for home oxygen)  Patient Saturations on Room Air at Rest = 85%  Patient Saturations on Room Air while Ambulating = Did not put patient on room air while ambulating because at rest, patient oxygen saturation decreased to 85%  Patient Saturations on 6 Liters of oxygen while Ambulating = >88%  Please briefly explain why patient needs home oxygen:  At rest, patient requires a minimum of 3L oxygen to maintain saturation above 88%. While ambulating, patient needs a minimum of 6L to maintain saturation above 88%

## 2023-01-14 NOTE — Progress Notes (Addendum)
ANTICOAGULATION CONSULT NOTE  Pharmacy Consult for heparin  Indication: chest pain/ACS, new onset afib with RVR   No Known Allergies  Patient Measurements: Height: 5\' 2"  (157.5 cm) Weight: 84.4 kg (186 lb 1.1 oz) IBW/kg (Calculated) : 50.1 Heparin Dosing Weight: 69.2kg   Vital Signs: Temp: 97.7 F (36.5 C) (03/30 0434) Temp Source: Oral (03/30 0434) BP: 148/53 (03/30 0434) Pulse Rate: 74 (03/30 0434)  Labs: Recent Labs    01/12/23 2258 01/12/23 2300 01/13/23 0150 01/13/23 0310 01/13/23 0646 01/13/23 0710 01/13/23 1010 01/13/23 1234 01/14/23 0231  HGB 13.5   < > 12.7  --   --  13.1  --   --  12.4  HCT 44.0   < > 40.8  --   --  41.7  --   --  39.7  PLT 173  --  165  --   --  168  --   --  165  HEPARINUNFRC  --   --   --   --  0.34  --   --   --  0.43  CREATININE 0.97  --   --   --   --  0.72  --   --  0.78  CKTOTAL 256*  --   --   --   --   --   --   --   --   TROPONINIHS 5,742*  --   --    < >  --  4,959* 4,191* 2,071*  --    < > = values in this interval not displayed.     Estimated Creatinine Clearance: 61.2 mL/min (by C-G formula based on SCr of 0.78 mg/dL).   Medical History: Past Medical History:  Diagnosis Date   Bronchitis    Cigarette smoker    COPD (chronic obstructive pulmonary disease) (HCC)    Pneumonia    Shortness of breath    with exertion; lasted used inhaler 06/02/13    Assessment: Patient presented with weakness and fatigue. Trop elevation up to 2,667, CBC stable. Noted thrombocytopenia, appears chronic. Not on anticoagulation prior to admission.   Heparin level therapeutic on 850 units/hr. Hgb and platelets wnl. No issues with infusion or bleeding noted.   Goal of Therapy:  Heparin level 0.3-0.7 units/ml Monitor platelets by anticoagulation protocol: Yes   Plan:  Continue heparin gtt 850 units/hr Daily heparin level, CBC, s/s bleeding F/u long term AC plan  Addendum:  Start apixaban 5 mg BID  Stop heparin gtt at the time of  apixaban administration    Francena Hanly, PharmD Pharmacy Resident  01/14/2023 7:31 AM

## 2023-01-15 ENCOUNTER — Inpatient Hospital Stay (HOSPITAL_COMMUNITY): Payer: Medicare (Managed Care)

## 2023-01-15 DIAGNOSIS — I5031 Acute diastolic (congestive) heart failure: Secondary | ICD-10-CM

## 2023-01-15 DIAGNOSIS — I5033 Acute on chronic diastolic (congestive) heart failure: Secondary | ICD-10-CM | POA: Diagnosis not present

## 2023-01-15 DIAGNOSIS — I4891 Unspecified atrial fibrillation: Secondary | ICD-10-CM | POA: Diagnosis not present

## 2023-01-15 DIAGNOSIS — I350 Nonrheumatic aortic (valve) stenosis: Secondary | ICD-10-CM

## 2023-01-15 DIAGNOSIS — J441 Chronic obstructive pulmonary disease with (acute) exacerbation: Secondary | ICD-10-CM | POA: Diagnosis not present

## 2023-01-15 DIAGNOSIS — J9621 Acute and chronic respiratory failure with hypoxia: Secondary | ICD-10-CM | POA: Diagnosis not present

## 2023-01-15 DIAGNOSIS — J9622 Acute and chronic respiratory failure with hypercapnia: Secondary | ICD-10-CM | POA: Diagnosis not present

## 2023-01-15 DIAGNOSIS — I480 Paroxysmal atrial fibrillation: Secondary | ICD-10-CM | POA: Diagnosis not present

## 2023-01-15 DIAGNOSIS — I214 Non-ST elevation (NSTEMI) myocardial infarction: Secondary | ICD-10-CM | POA: Diagnosis not present

## 2023-01-15 HISTORY — DX: Acute diastolic (congestive) heart failure: I50.31

## 2023-01-15 LAB — GLUCOSE, CAPILLARY
Glucose-Capillary: 120 mg/dL — ABNORMAL HIGH (ref 70–99)
Glucose-Capillary: 140 mg/dL — ABNORMAL HIGH (ref 70–99)
Glucose-Capillary: 152 mg/dL — ABNORMAL HIGH (ref 70–99)
Glucose-Capillary: 157 mg/dL — ABNORMAL HIGH (ref 70–99)
Glucose-Capillary: 174 mg/dL — ABNORMAL HIGH (ref 70–99)
Glucose-Capillary: 179 mg/dL — ABNORMAL HIGH (ref 70–99)
Glucose-Capillary: 224 mg/dL — ABNORMAL HIGH (ref 70–99)

## 2023-01-15 LAB — BLOOD GAS, ARTERIAL
Acid-Base Excess: 11.3 mmol/L — ABNORMAL HIGH (ref 0.0–2.0)
Bicarbonate: 40.6 mmol/L — ABNORMAL HIGH (ref 20.0–28.0)
Drawn by: 283401
O2 Saturation: 100 %
Patient temperature: 37
pCO2 arterial: 77 mmHg (ref 32–48)
pH, Arterial: 7.33 — ABNORMAL LOW (ref 7.35–7.45)
pO2, Arterial: 106 mmHg (ref 83–108)

## 2023-01-15 LAB — RENAL FUNCTION PANEL
Albumin: 3.2 g/dL — ABNORMAL LOW (ref 3.5–5.0)
Anion gap: 11 (ref 5–15)
BUN: 16 mg/dL (ref 8–23)
CO2: 33 mmol/L — ABNORMAL HIGH (ref 22–32)
Calcium: 9 mg/dL (ref 8.9–10.3)
Chloride: 95 mmol/L — ABNORMAL LOW (ref 98–111)
Creatinine, Ser: 0.72 mg/dL (ref 0.44–1.00)
GFR, Estimated: >60 mL/min (ref >60)
Glucose, Bld: 184 mg/dL — ABNORMAL HIGH (ref 70–99)
Phosphorus: 4.4 mg/dL (ref 2.5–4.6)
Potassium: 5.4 mmol/L — ABNORMAL HIGH (ref 3.5–5.1)
Sodium: 139 mmol/L (ref 135–145)

## 2023-01-15 LAB — CBC
HCT: 41 % (ref 36.0–46.0)
Hemoglobin: 12.7 g/dL (ref 12.0–15.0)
MCH: 30.2 pg (ref 26.0–34.0)
MCHC: 31 g/dL (ref 30.0–36.0)
MCV: 97.4 fL (ref 80.0–100.0)
Platelets: 157 10*3/uL (ref 150–400)
RBC: 4.21 MIL/uL (ref 3.87–5.11)
RDW: 13.2 % (ref 11.5–15.5)
WBC: 13.3 10*3/uL — ABNORMAL HIGH (ref 4.0–10.5)
nRBC: 0 % (ref 0.0–0.2)

## 2023-01-15 LAB — MAGNESIUM: Magnesium: 2.1 mg/dL (ref 1.7–2.4)

## 2023-01-15 LAB — APTT: aPTT: 46 seconds — ABNORMAL HIGH (ref 24–36)

## 2023-01-15 LAB — HEPARIN LEVEL (UNFRACTIONATED): Heparin Unfractionated: 0.92 IU/mL — ABNORMAL HIGH (ref 0.30–0.70)

## 2023-01-15 MED ORDER — MORPHINE SULFATE (PF) 2 MG/ML IV SOLN
1.0000 mg | Freq: Once | INTRAVENOUS | Status: AC
  Administered 2023-01-15: 1 mg via INTRAVENOUS
  Filled 2023-01-15: qty 1

## 2023-01-15 MED ORDER — FUROSEMIDE 40 MG PO TABS
40.0000 mg | ORAL_TABLET | Freq: Every day | ORAL | Status: DC
  Filled 2023-01-15: qty 1

## 2023-01-15 MED ORDER — HEPARIN (PORCINE) 25000 UT/250ML-% IV SOLN
1050.0000 [IU]/h | INTRAVENOUS | Status: DC
  Administered 2023-01-15: 850 [IU]/h via INTRAVENOUS
  Administered 2023-01-16: 1050 [IU]/h via INTRAVENOUS
  Filled 2023-01-15 (×2): qty 250

## 2023-01-15 MED ORDER — SODIUM ZIRCONIUM CYCLOSILICATE 10 G PO PACK
10.0000 g | PACK | Freq: Once | ORAL | Status: AC
  Administered 2023-01-15: 10 g via ORAL
  Filled 2023-01-15: qty 1

## 2023-01-15 MED ORDER — FUROSEMIDE 10 MG/ML IJ SOLN
INTRAMUSCULAR | Status: AC
  Administered 2023-01-15: 20 mg
  Filled 2023-01-15: qty 2

## 2023-01-15 MED ORDER — FUROSEMIDE 10 MG/ML IJ SOLN
40.0000 mg | Freq: Every day | INTRAMUSCULAR | Status: DC
  Administered 2023-01-15 – 2023-01-16 (×2): 40 mg via INTRAVENOUS
  Filled 2023-01-15 (×3): qty 4

## 2023-01-15 MED ORDER — FUROSEMIDE 10 MG/ML IJ SOLN: 20.0000 mg | Freq: Once | INTRAMUSCULAR | Status: DC

## 2023-01-15 NOTE — Progress Notes (Addendum)
Progress Note  Patient Name: Deanna Petty Date of Encounter: 01/15/2023  Primary Cardiologist: None  Subjective   Patient had increased respiratory demands last night, had to be started on BiPAP, IV Lasix 20 mg was administered with 1L urine output. Denies any symptoms of chest pain but continues to have SOB but improved compared to yesterday. Currently on 5 L of nasal cannula oxygen.  Inpatient Medications    Scheduled Meds:  apixaban  5 mg Oral BID   arformoterol  15 mcg Nebulization BID   aspirin EC  81 mg Oral Daily   atorvastatin  10 mg Oral QHS   bisoprolol  5 mg Oral Daily   budesonide (PULMICORT) nebulizer solution  0.5 mg Nebulization BID   furosemide  40 mg Oral Daily   guaiFENesin  600 mg Oral BID   insulin aspart  0-9 Units Subcutaneous Q4H   pantoprazole  40 mg Oral Daily   revefenacin  175 mcg Nebulization Daily   Continuous Infusions:  cefTRIAXone (ROCEPHIN)  IV 1 g (01/15/23 0956)   PRN Meds: acetaminophen **OR** acetaminophen, fentaNYL (SUBLIMAZE) injection, HYDROcodone-acetaminophen, ipratropium-albuterol, nicotine polacrilex, ondansetron **OR** ondansetron (ZOFRAN) IV   Vital Signs    Vitals:   01/15/23 0800 01/15/23 0803 01/15/23 0816 01/15/23 0823  BP:      Pulse:      Resp:      Temp:      TempSrc:      SpO2: (!) 79% (!) 85% 100% 96%  Weight:      Height:        Intake/Output Summary (Last 24 hours) at 01/15/2023 1055 Last data filed at 01/15/2023 0254 Gross per 24 hour  Intake --  Output 1850 ml  Net -1850 ml   Filed Weights   01/12/23 2100  Weight: 84.4 kg    Telemetry     Personally reviewed, rate is controlled  ECG    EKG not performed today  Physical Exam   GEN: No acute distress.   Neck: No JVD. Cardiac: RRR, no murmur, rub, or gallop.  Respiratory: labored on 5L [place] O2. Rales present. GI: Soft, nontender, bowel sounds present. MS: No edema; No deformity. Neuro:  Nonfocal. Psych: Alert and oriented x 3. Normal  affect.  Labs    Chemistry Recent Labs  Lab 01/12/23 1545 01/12/23 1625 01/13/23 0710 01/14/23 0231 01/15/23 0150  NA 144   < > 138 136 139  K 2.8*   < > 4.8 5.0 5.4*  CL 111   < > 98 97* 95*  CO2 25   < > 32 31 33*  GLUCOSE 149*   < > 175* 191* 184*  BUN 11   < > 14 17 16   CREATININE 0.61   < > 0.72 0.78 0.72  CALCIUM 6.2*   < > 8.8* 9.0 9.0  PROT 3.9*  --  5.9*  --   --   ALBUMIN 1.9*  --  2.8* 3.1* 3.2*  AST 18  --  47*  --   --   ALT 18  --  26  --   --   ALKPHOS 46  --  63  --   --   BILITOT 0.4  --  0.5  --   --   GFRNONAA >60   < > >60 >60 >60  ANIONGAP 8   < > 8 8 11    < > = values in this interval not displayed.     Hematology Recent Labs  Lab 01/13/23 0710 01/13/23 1234 01/14/23 0231 01/15/23 0150  WBC 10.0  --  14.7* 13.3*  RBC 4.34 4.51 4.15 4.21  HGB 13.1  --  12.4 12.7  HCT 41.7  --  39.7 41.0  MCV 96.1  --  95.7 97.4  MCH 30.2  --  29.9 30.2  MCHC 31.4  --  31.2 31.0  RDW 12.6  --  13.1 13.2  PLT 168  --  165 157    Cardiac Enzymes Recent Labs  Lab 01/12/23 2258 01/13/23 0310 01/13/23 0710 01/13/23 1010 01/13/23 1234  TROPONINIHS 5,742* 6,255* 4,959* 4,191* 2,071*    BNP Recent Labs  Lab 01/12/23 1545  BNP 140.7*     DDimerNo results for input(s): "DDIMER" in the last 168 hours.   Radiology    DG CHEST PORT 1 VIEW  Result Date: 01/15/2023 CLINICAL DATA:  Dyspnea EXAM: PORTABLE CHEST 1 VIEW COMPARISON:  Three days ago FINDINGS: Borderline heart size. Negative aortic and hilar contours accounting for rotation. Increased patchy density in the lungs. Known nodule in the superior segment the left lower lobe, recognized in the chart. No visible effusion or pneumothorax IMPRESSION: 1. Increased patchy pulmonary density could be pneumonia or developing edema. 2. Known left lower lobe nodule. Electronically Signed   By: Jorje Guild M.D.   On: 01/15/2023 05:07   ECHOCARDIOGRAM COMPLETE  Result Date: 01/13/2023    ECHOCARDIOGRAM  REPORT   Patient Name:   Deanna Petty Date of Exam: 01/13/2023 Medical Rec #:  [id number]     Height:       62.0 in Accession #:    [id number]    Weight:       186.1 lb Date of Birth:  [birth year]    BSA:          1.854 m Patient Age:    77 years      BP:           143/55 mmHg Patient Gender: F             HR:           73 bpm. Exam Location:  Inpatient Procedure: 2D Echo, Cardiac Doppler, Color Doppler and Intracardiac            Opacification Agent Indications:    elevated troponin  History:        Patient has prior history of Echocardiogram examinations. COPD.  Sonographer:    Phineas Douglas Referring Phys: [id number] [place]  1. Left ventricular ejection fraction, by estimation, is 60 to 65%. The left ventricle has normal function. The left ventricle has no regional wall motion abnormalities. There is mild concentric left ventricular hypertrophy. Left ventricular diastolic parameters are consistent with Grade I diastolic dysfunction (impaired relaxation).  2. Right ventricular systolic function is normal. The right ventricular size is normal. Tricuspid regurgitation signal is inadequate for assessing PA pressure.  3. No evidence of mitral valve regurgitation.  4. There is moderate calcification of the aortic valve. Aortic valve regurgitation is not visualized. Mild aortic valve stenosis.  5. The inferior vena cava is dilated in size with >50% respiratory variability, suggesting right atrial pressure of 8 mmHg. FINDINGS  Left Ventricle: Left ventricular ejection fraction, by estimation, is 60 to 65%. The left ventricle has normal function. The left ventricle has no regional wall motion abnormalities. Definity contrast agent was given IV to delineate the left ventricular  endocardial borders. The left ventricular internal cavity size was normal in size.  There is mild concentric left ventricular hypertrophy. Left ventricular diastolic parameters are consistent with Grade I diastolic dysfunction  (impaired relaxation). Right Ventricle: The right ventricular size is normal. Right ventricular systolic function is normal. Tricuspid regurgitation signal is inadequate for assessing PA pressure. Left Atrium: Left atrial size was normal in size. Right Atrium: Right atrial size was normal in size. Pericardium: There is no evidence of pericardial effusion. Mitral Valve: No evidence of mitral valve regurgitation. Tricuspid Valve: Tricuspid valve regurgitation is not demonstrated. Aortic Valve: There is moderate calcification of the aortic valve. Aortic valve regurgitation is not visualized. Mild aortic stenosis is present. Aortic valve mean gradient measures 12.3 mmHg. Aortic valve peak gradient measures 19.0 mmHg. Aortic valve area, by VTI measures 1.26 cm. Pulmonic Valve: Pulmonic valve regurgitation is not visualized. Aorta: The aortic root and ascending aorta are structurally normal, with no evidence of dilitation. Venous: The inferior vena cava is dilated in size with greater than 50% respiratory variability, suggesting right atrial pressure of 8 mmHg. IAS/Shunts: No atrial level shunt detected by color flow Doppler.  LEFT VENTRICLE PLAX 2D LVIDd:         4.00 cm     Diastology LVIDs:         2.00 cm     LV e' medial:    6.09 cm/s LV PW:         1.30 cm     LV E/e' medial:  19.5 LV IVS:        1.20 cm     LV e' lateral:   8.70 cm/s LVOT diam:     1.80 cm     LV E/e' lateral: 13.7 LV SV:         67 LV SV Index:   36 LVOT Area:     2.54 cm  LV Volumes (MOD) LV vol d, MOD A2C: 88.4 ml LV vol d, MOD A4C: 82.3 ml LV vol s, MOD A2C: 22.6 ml LV vol s, MOD A4C: 19.1 ml LV SV MOD A2C:     65.8 ml LV SV MOD A4C:     82.3 ml LV SV MOD BP:      64.4 ml RIGHT VENTRICLE             IVC RV Basal diam:  3.40 cm     IVC diam: 2.40 cm RV S prime:     11.50 cm/s TAPSE (M-mode): 1.5 cm LEFT ATRIUM             Index        RIGHT ATRIUM           Index LA diam:        4.20 cm 2.27 cm/m   RA Area:     13.40 cm LA Vol (A2C):   54.1  ml 29.18 ml/m  RA Volume:   29.30 ml  15.80 ml/m LA Vol (A4C):   51.0 ml 27.51 ml/m LA Biplane Vol: 55.9 ml 30.15 ml/m  AORTIC VALVE AV Area (Vmax):    1.25 cm AV Area (Vmean):   1.12 cm AV Area (VTI):     1.26 cm AV Vmax:           217.67 cm/s AV Vmean:          170.667 cm/s AV VTI:            0.531 m AV Peak Grad:      19.0 mmHg AV Mean Grad:      12.3 mmHg  LVOT Vmax:         107.00 cm/s LVOT Vmean:        75.200 cm/s LVOT VTI:          0.263 m LVOT/AV VTI ratio: 0.49  AORTA Ao Root diam: 2.80 cm Ao Asc diam:  2.50 cm MITRAL VALVE MV Area (PHT): 3.19 cm     SHUNTS MV Decel Time: 238 msec     Systemic VTI:  0.26 m MV E velocity: 119.00 cm/s  Systemic Diam: 1.80 cm MV A velocity: 140.00 cm/s MV E/A ratio:  0.85 Landscape architect signed by Phineas Inches Signature Date/Time: 01/13/2023/11:25:37 AM    Final     Cardiac Studies   Echocardiogram on 12/2008/24 LVEF normal Mild aortic valve stenosis   Assessment & Plan   Patient is a 77 year old F known to have HTN, DM 2, tobacco abuse, 1.8 cm lung nodule, COPD is currently admitted to hospitalist team for the management of acute on chronic hypoxic respiratory failure secondary to COPD exacerbation versus HFpEF exacerbation and new onset of A-fib with RVR.  Cardiology was consulted for elevated troponins.  # Acute on chronic hypoxic respiratory failure secondary to HFpEF exacerbation, less likely COPD. -Started on BiPAP early morning, patient had 1L urine output after administering IV Lasix 20 mg this morning at 2 AM. Currently on 5L nasal cannula oxygen (3L at home). Will start IV Lasix 40 mg once daily and re-assess symptoms.  # NSTEMI c/w HFpEF -EKG showed diffuse T wave inversions since 2021 (although these EKGs were performed during ER/hospital admissions), Hs troponins 2,667>>5742>>6255>>4959>>4191. Patient denies having chest pain but has been having DOE which is an anginal equivalent. Due to risk factors of HTN, DM 2, longstanding  tobacco abuse history and significantly elevated troponins in the current hospitalization, significant CAD cannot be ruled out.  She will benefit from inpatient invasive ischemia evaluation after her respiratory status is optimized, with LHC and RHC. Last dose of Eliquis was on 01/14/2023. Will hold Eliquis and start heparin drip. Risks and benefits of cardiac catheterization have been discussed with the patient. These include bleeding, infection, kidney damage, stroke, heart attack, death. The patient understands these risks and is willing to proceed. She will be tentatively scheduled for LHC on 01/17/2023 (to allow time for optimization of respiratory status and Eliquis hold time). -Patient agreed to reverse her DNR status to full code for the [facility] procedure. Diagnostic coronary angiogram would be reasonable at this point. If she needs PCI, will need to have discussion with the pulmonology about the urgency of the lung nodule biopsy.  -Strongly recommend to continue ACS protocol for total duration of at least 48 hours, if not longer. It would be ideal to continue for 5 days.  # Paroxysmal A-fib with RVR, rate controlled -Continue bisoprolol 5 mg once daily -Restart heparin drip (hold Eliquis today)  # Mild aortic valve stenosis on admission: Outpatient echo surveillance   I have spent a total of 30 minutes with patient reviewing chart , telemetry, EKGs, labs and examining patient as well as establishing an assessment and plan that was discussed with the patient.  > 50% of time was spent in direct patient care.    Signed, Chalmers Guest, MD  01/15/2023, 10:55 AM

## 2023-01-15 NOTE — Progress Notes (Signed)
PROGRESS NOTE  Deanna Petty [id number] DOB: [birth year]   PCP: Pcp, No  Patient is from: Home.  Uses cane at baseline  DOA: 01/12/2023 LOS: 3  Chief complaints Chief Complaint  Patient presents with   Tachycardia    AFIB     Brief Narrative / Interim history: 77 year old F with PMH of COPD/chronic hypoxic RF on 3 L, moderate AS, DM-2, tobacco use disorder (lifetime smoker), HTN, HLD and obesity presenting with cough, shortness of breath, nausea, weakness and increased heart rate, and admitted for new onset A-fib with RVR, COPD exacerbation and NSTEMI.  In ED, in RVR to 151.  RR 34.  Desaturated to 81% on home 3 L.  VBG with acute on chronic respiratory acidosis.  K2.8.  Mg 1.1.  BNP 140.  Troponin 20 and trended up to 2700.  EKG in A-fib with RVR to 149.  Repeat EKG sinus rhythm with RBBB and TWI in inferolateral leads.  CTA chest negative for PE but enlarging spiculated 1.8 cm nodule in LUL concerning for bronchogenic carcinoma.  Patient converted to sinus rhythm after IV Cardizem push.  Started on IV heparin and COPD meds.  Cardiology consulted.  TTE ordered.  TTE without significant finding. Pulmonology evaluated patient and recommended close follow-up outpatient for LUL mass.  Patient is back on IV heparin for non-STEMI.  Cardiology planning LHC on 4/2.   Subjective: Seen and examined earlier this morning.  Patient had an episode of hypoxemia to 70's, shortness of breath and started on BiPAP.  ABG with hypercapnia but compensated.  She is currently on 3 L by nasal cannula saturating in low 90s.  Continues to endorse shortness of breath, DOE, weakness.  Denies chest pain.  Objective: Vitals:   01/15/23 0816 01/15/23 0821 01/15/23 0823 01/15/23 1155  BP:  (!) 161/56    Pulse:  (!) 59  60  Resp:  20  20  Temp:  98 F (36.7 C)  98.3 F (36.8 C)  TempSrc:  Oral  Oral  SpO2: 100% 100% 96% 97%  Weight:      Height:        Examination:  GENERAL: No apparent distress.   Nontoxic. HEENT: MMM.  Vision and hearing grossly intact.  NECK: Supple.  No apparent JVD.  RESP:  No IWOB.  Diminished aeration bilaterally.  Crackles.  No wheeze.  Easily winded to sit up in bed. CVS:  RRR. Heart sounds normal.  ABD/GI/GU: BS+. Abd soft, NTND.  MSK/EXT:   No apparent deformity. Moves extremities.  Trace BLE edema. SKIN: no apparent skin lesion or wound NEURO: Awake and alert. Oriented appropriately.  No apparent focal neuro deficit. PSYCH: Calm. Normal affect.   Procedures:  None  Microbiology summarized: [id number], influenza and RSV PCR nonreactive  Assessment and plan: Principal Problem:   COPD with acute exacerbation ([place]) Active Problems:   Tobacco use   Type 2 diabetes mellitus with hyperlipidemia (HCC)   Elevated brain natriuretic peptide (BNP) level   Acute on chronic respiratory failure with hypoxia and hypercapnia (HCC)   Essential hypertension   Hypomagnesemia   New onset atrial fibrillation (HCC)   Myocardial infarction due to demand ischemia (HCC)   Non-STEMI (non-ST elevated myocardial infarction) (HCC)   Hypokalemia   Acute heart failure with preserved ejection fraction (HFpEF) ([place])  New onset A-fib with RVR: Converted to sinus rhythm after IV Cardizem push in ED.  Remains in sinus rhythm.  Could be due to COPD exacerbation and electrolyte derangement.  TTE  without significant finding.  CHA2DS2-VASc score > 5 -Cardiology on board -Continue Zebeta -Changed heparin to Eliquis -Optimize electrolytes  Non-STEMI: Patient without chest pain but the DOE.  Troponin peaked at 6000 and started to trend down.  EKG with TWI in inferolateral leads but not new.  TTE without significant finding.  LDL 32.  A1c 7.4%. -Cardiology on board-continue Zebeta, aspirin and Lipitor -Back on IV heparin by cardiology -Plan for LHC on 4/2  COPD exacerbation: Likely due to ongoing cigarette smoking.  Lifetime smoker.  Reports good compliance -Continue IV Solu-Medrol  and ceftriaxone -Brovana, Pulmicort and Yupelri -Continue DuoNebs as needed -Discussed the importance of smoking cessation  Acute on chronic HFpEF: Patient continues to have significant respiratory distress.  CXR suggesting CHF BNP slightly elevated.  Started on IV Lasix overnight.  She had almost 2 L. Cr stable. -Started on IV Lasix 40 mg daily by cardiology -Strict intake and output, daily weight, renal functions and electrolytes  Acute on chronic respiratory failure with hypoxia and hypercapnia: On 3 L at baseline.  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  Followed by Dr. Kimber Relic. -PCCM consulted and recommended close follow-up outpatient.  Hypokalemia/hypomagnesemia: Resolved -Monitor replenish as appropriate  NIDDM-2 with hyperglycemia and hyperlipidemia: A1c is 1.4%.  On metformin at home. Recent Labs  Lab 01/15/23 0000 01/15/23 0445 01/15/23 0826 01/15/23 1153 01/15/23 1326  GLUCAP 174* 152* 120* 157* 179*  -Continue SSI-moderate -Increase Semglee from 10 to 15 units daily -Continue statin  Tobacco use disorder: Lifetime smoker.  Over 100-pack-year history -Encouraged smoking cessation -Likes to try nicotine gums.  Reports skin irritation with nicotine patch  Essential hypertension: Normotensive. -Continue Zebeta for now -Continue holding amlodipine  Physical deconditioning: Uses cane at baseline -PT/OT  Hyperkalemia -P.o. Lokelma 10 g x 1  Obesity Body mass index is 34.03 kg/m. Nutrition Problem: Predicted suboptimal nutrient intake Etiology:  (hospitalization) Signs/Symptoms:  (recent COPD exacerbation and lung nodule) Interventions: Education   DVT prophylaxis:   SCDs Start: 01/13/23 0659  Code Status: DNR/DNI Family Communication: None at bedside Level of care: Telemetry Cardiac Status is: Inpatient Remains inpatient appropriate because: Respiratory failure, non-STEMI, A-fib and COPD exacerbation   Final disposition: Home Consultants:  Cardiology PCCM  55 minutes with more than 50% spent in reviewing records, counseling patient/family and coordinating care.   Sch Meds:  Scheduled Meds:  arformoterol  15 mcg Nebulization BID   aspirin EC  81 mg Oral Daily   atorvastatin  10 mg Oral QHS   bisoprolol  5 mg Oral Daily   budesonide (PULMICORT) nebulizer solution  0.5 mg Nebulization BID   furosemide  40 mg Intravenous Daily   guaiFENesin  600 mg Oral BID   insulin aspart  0-9 Units Subcutaneous Q4H   pantoprazole  40 mg Oral Daily   revefenacin  175 mcg Nebulization Daily   Continuous Infusions:  cefTRIAXone (ROCEPHIN)  IV 1 g (01/15/23 0956)   heparin     PRN Meds:.acetaminophen **OR** acetaminophen, fentaNYL (SUBLIMAZE) injection, HYDROcodone-acetaminophen, ipratropium-albuterol, nicotine polacrilex, ondansetron **OR** ondansetron (ZOFRAN) IV  Antimicrobials: Anti-infectives (From admission, onward)    Start     Dose/Rate Route Frequency Ordered Stop   01/13/23 0658  cefTRIAXone (ROCEPHIN) 1 g in sodium  chloride 0.9 % 100 mL IVPB        1 g 200 mL/hr over 30 Minutes Intravenous Every 24 hours 01/13/23 0658 01/18/23 0659        I have personally reviewed the following labs and images: CBC: Recent Labs  Lab 01/12/23 1545 01/12/23 1625 01/12/23 2258 01/12/23 2300 01/13/23 0150 01/13/23 0710 01/14/23 0231 01/15/23 0150  WBC 7.0  --  8.4  --  8.4 10.0 14.7* 13.3*  NEUTROABS 4.9  --  7.7  --   --   --   --   --   HGB 10.2*   < > 13.5 14.6 12.7 13.1 12.4 12.7  HCT 33.3*   < > 44.0 43.0 40.8 41.7 39.7 41.0  MCV 99.4  --  97.8  --  96.9 96.1 95.7 97.4  PLT 115*  --  173  --  165 168 165 157   < > = values in this  interval not displayed.   BMP &GFR Recent Labs  Lab 01/12/23 1545 01/12/23 1625 01/12/23 2258 01/12/23 2300 01/13/23 0710 01/14/23 0231 01/15/23 0150  NA 144   < > 136 137 138 136 139  K 2.8*   < > 5.4* 5.3* 4.8 5.0 5.4*  CL 111  --  92*  --  98 97* 95*  CO2 25  --  33*  --  32 31 33*  GLUCOSE 149*  --  307*  --  175* 191* 184*  BUN 11  --  15  --  14 17 16   CREATININE 0.61  --  0.97  --  0.72 0.78 0.72  CALCIUM 6.2*  --  9.2  --  8.8* 9.0 9.0  MG 1.1*  --  2.1  --  2.0 1.9 2.1  PHOS  --   --  4.3  --  3.5 3.4 4.4   < > = values in this interval not displayed.   Estimated Creatinine Clearance: 61.2 mL/min (by C-G formula based on SCr of 0.72 mg/dL). Liver & Pancreas: Recent Labs  Lab 01/12/23 1545 01/13/23 0710 01/14/23 0231 01/15/23 0150  AST 18 47*  --   --   ALT 18 26  --   --   ALKPHOS 46 63  --   --   BILITOT 0.4 0.5  --   --   PROT 3.9* 5.9*  --   --   ALBUMIN 1.9* 2.8* 3.1* 3.2*   No results for input(s): "LIPASE", "AMYLASE" in the last 168 hours. No results for input(s): "AMMONIA" in the last 168 hours. Diabetic: Recent Labs    01/12/23 2258  HGBA1C 7.4*   Recent Labs  Lab 01/15/23 0000 01/15/23 0445 01/15/23 0826 01/15/23 1153 01/15/23 1326  GLUCAP 174* 152* 120* 157* 179*   Cardiac Enzymes: Recent Labs  Lab 01/12/23 2258  CKTOTAL 256*   No results for input(s): "PROBNP" in the last 8760 hours. Coagulation Profile: No results for input(s): "INR", "PROTIME" in the last 168 hours. Thyroid Function Tests: Recent Labs    01/12/23 2258  TSH 0.573   Lipid Profile: Recent Labs    01/13/23 0150  CHOL 92  HDL 51  LDLCALC 32  TRIG 46  CHOLHDL 1.8   Anemia Panel: Recent Labs    01/13/23 1005 01/13/23 1006 01/13/23 1234  VITAMINB12 566  --   --   FOLATE  --  21.0  --   FERRITIN 87  --   --   TIBC 354  --   --  IRON 84  --   --   RETICCTPCT  --   --  1.3   Urine analysis:    Component Value Date/Time   COLORURINE YELLOW  01/12/2023 2255   APPEARANCEUR HAZY (A) 01/12/2023 2255   LABSPEC >1.046 (H) 01/12/2023 2255   PHURINE 6.0 01/12/2023 2255   GLUCOSEU 50 (A) 01/12/2023 2255   HGBUR SMALL (A) 01/12/2023 2255   BILIRUBINUR NEGATIVE 01/12/2023 2255   KETONESUR NEGATIVE 01/12/2023 2255   PROTEINUR 100 (A) 01/12/2023 2255   NITRITE NEGATIVE 01/12/2023 2255   LEUKOCYTESUR NEGATIVE 01/12/2023 2255   Sepsis Labs: Invalid input(s): "PROCALCITONIN", "LACTICIDVEN"  Microbiology: Recent Results (from the past 240 hour(s))  Resp panel by RT-PCR (RSV, Flu A&B, Covid) Anterior Nasal Swab     Status: None   Collection Time: 01/12/23  3:40 PM   Specimen: Anterior Nasal Swab  Result Value Ref Range Status   SARS Coronavirus 2 by RT PCR NEGATIVE NEGATIVE Final   Influenza A by PCR NEGATIVE NEGATIVE Final   Influenza B by PCR NEGATIVE NEGATIVE Final    Comment: (NOTE) The Xpert Xpress SARS-CoV-2/FLU/RSV plus assay is intended as an aid in the diagnosis of influenza from Nasopharyngeal swab specimens and should not be used as a sole basis for treatment. Nasal washings and aspirates are unacceptable for Xpert Xpress SARS-CoV-2/FLU/RSV testing.  Fact Sheet for Patients: EntrepreneurPulse.com.au  Fact Sheet for Healthcare Providers: IncredibleEmployment.be  This test is not yet approved or cleared by the [place] FDA and has been authorized for detection and/or diagnosis of SARS-CoV-2 by FDA under an Emergency Use Authorization (EUA). This EUA will remain in effect (meaning this test can be used) for the duration of the COVID-19 declaration under Section 564(b)(1) of the Act, 21 U.S.C. section 360bbb-3(b)(1), unless the authorization is terminated or revoked.     Resp Syncytial Virus by PCR NEGATIVE NEGATIVE Final    Comment: (NOTE) Fact Sheet for Patients: EntrepreneurPulse.com.au  Fact Sheet for Healthcare  Providers: IncredibleEmployment.be  This test is not yet approved or cleared by the [place] FDA and has been authorized for detection and/or diagnosis of SARS-CoV-2 by FDA under an Emergency Use Authorization (EUA). This EUA will remain in effect (meaning this test can be used) for the duration of the COVID-19 declaration under Section 564(b)(1) of the Act, 21 U.S.C. section 360bbb-3(b)(1), unless the authorization is terminated or revoked.  Performed at [facility], [place] [address], [place], [place] [address]     Radiology Studies: DG CHEST PORT 1 VIEW  Result Date: 01/15/2023 CLINICAL DATA:  Dyspnea EXAM: PORTABLE CHEST 1 VIEW COMPARISON:  Three days ago FINDINGS: Borderline heart size. Negative aortic and hilar contours accounting for rotation. Increased patchy density in the lungs. Known nodule in the superior segment the left lower lobe, recognized in the chart. No visible effusion or pneumothorax IMPRESSION: 1. Increased patchy pulmonary density could be pneumonia or developing edema. 2. Known left lower lobe nodule. Electronically Signed   By: Jorje Guild M.D.   On: 01/15/2023 05:07      Adely Facer T. Tornado  If 7PM-7AM, please contact night-coverage [email or web address] 01/15/2023, 2:02 PM

## 2023-01-15 NOTE — Progress Notes (Signed)
Critical care note  Date of note: 01/05/2023.  Subjective: The patient had worsening hypoxia with pulse oximetry down to the 70s on 5 L of O2 by nasal cannula with diminished breath sounds, tachypnea and wheezing.  No chest pain or palpitations.  No nausea or vomiting.  No choking on food.  No fever or chills.  Objective: Physical examination: Generally: Acutely ill elderly Caucasian female in moderate respiratory distress with conversational dyspnea and somnolence Vital signs: BP was 167/86 with heart rate of 73, respiratory rate of 29 and temperature 98.3 with pulse currently of 82-85 on 6 L O2 by nasal cannula and 95% on 100% nonrebreather. Head - atraumatic, normocephalic.  Pupils - equal, round and reactive to light and accommodation. Extraocular movements are intact. No scleral icterus.  Oropharynx - moist mucous membranes and tongue. No pharyngeal erythema or exudate.  Neck - supple. No JVD. Carotid pulses 2+ bilaterally. No carotid bruits. No palpable thyromegaly or lymphadenopathy. Cardiovascular - regular rate and rhythm. Normal S1 and S2. No murmurs, gallops or rubs.  Lungs -diffuse expiratory wheezes with tight expiratory airflow and harsh vesicular breathing. Abdomen - soft and nontender. Positive bowel sounds. No palpable organomegaly or masses.  Extremities -1+ bilateral lower extremity pitting edema with no, clubbing or cyanosis.  Neuro - grossly non-focal. Skin - no rashes. Breast, pelvic and rectal - deferred.  Labs and notes were reviewed.  Stat ABG showed pH 7.33, pCO2 of 77, pO2 of 106 and HCO3 40.6 with O2 sat of 100% on 50% FiO2. CBC showed leukocytosis 13.3 compared to 14.7 yesterday.  Labs revealed mild hyperkalemia 5.4 and chloride 95 with CO2 33 and albumin 3.2 with blood glucose of 184.  Magnesium level was 2.1.  Stat portable chest x-ray showed borderline cardiomegaly without edema and mild interstitial accentuation can be counter with smokers as well as aortic  atherosclerosis.  Assessment/plan: Acute on chronic hypoxic and hypercarbic respiratory failure requiring BiPAP secondary to COPD exacerbation. - The patient will be continued on BiPAP tonight. - We will continue antibiotic therapy with IV Rocephin and steroid therapy with IV Solu-Medrol. - We will continue her inhalers as well as scheduled and as needed DuoNebs. - She was given 1 mg IV morphine sulfate and 20 mg of IV Lasix for pulmonary congestion in addition to DuoNeb.  Will continue other plan of care.  Authorized and performed by: Eugenie Norrie, MD Total critical care time: Approximately   35    minutes. Due to a high probability of clinically significant, life-threatening deterioration, the patient required my highest level of preparedness to intervene emergently and I personally spent this critical care time directly and personally managing the patient.  This critical care time included obtaining a history, examining the patient, pulse oximetry, ordering and review of studies, arranging urgent treatment with development of management plan, evaluation of patient's response to treatment, frequent reassessment, and discussions with other providers. This critical care time was performed to assess and manage the high probability of imminent, life-threatening deterioration that could result in multiorgan failure.  It was exclusive of separately billable procedures and treating other patients and teaching time.

## 2023-01-15 NOTE — Progress Notes (Addendum)
Patient found during rounds with O2 sats 81% and dropping to 79% despite on 5-6L [place]. Pt assessed. C/o SOB, wheezes audible.Pt given a breathing treatment. MD notified. Lasix  and morphine as ordered. O2sats improving to 85-86%. CXR pending.  Critical PCO2 at 77 notified MD. Pt desatting into the low 80s. MD notified. Placed on BIPAP with resp status improvement.

## 2023-01-15 NOTE — Progress Notes (Signed)
RT called due to Pt sat's in 70's. Upon arrival pt on NRB SPO2 97%. ABG collected. 7.33/ 77/ 106/ 41 100%. Pt placed on Bipap 14/6 50%. Pt tolerating well at this time.

## 2023-01-15 NOTE — Progress Notes (Signed)
ANTICOAGULATION CONSULT NOTE  Pharmacy Consult for heparin  Indication: chest pain/ACS, new onset afib with RVR   No Known Allergies  Patient Measurements: Height: 5\' 2"  (157.5 cm) Weight: 84.4 kg (186 lb 1.1 oz) IBW/kg (Calculated) : 50.1 Heparin Dosing Weight: 69.2kg   Vital Signs: Temp: 98 F (36.7 C) (03/31 2037) Temp Source: Oral (03/31 2037) BP: 148/47 (03/31 2037) Pulse Rate: 64 (03/31 2202)  Labs: Recent Labs    01/12/23 2258 01/12/23 2300 01/13/23 0646 01/13/23 0710 01/13/23 1010 01/13/23 1234 01/14/23 0231 01/15/23 0150 01/15/23 2119  HGB 13.5   < >  --  13.1  --   --  12.4 12.7  --   HCT 44.0   < >  --  41.7  --   --  39.7 41.0  --   PLT 173   < >  --  168  --   --  165 157  --   APTT  --   --   --   --   --   --   --   --  46*  HEPARINUNFRC  --   --  0.34  --   --   --  0.43  --  0.92*  CREATININE 0.97  --   --  0.72  --   --  0.78 0.72  --   CKTOTAL 256*  --   --   --   --   --   --   --   --   TROPONINIHS 5,742*   < >  --  4,959* 4,191* 2,071*  --   --   --    < > = values in this interval not displayed.     Estimated Creatinine Clearance: 61.2 mL/min (by C-G formula based on SCr of 0.72 mg/dL).   Medical History: Past Medical History:  Diagnosis Date   Bronchitis    Cigarette smoker    COPD (chronic obstructive pulmonary disease) (HCC)    Pneumonia    Shortness of breath    with exertion; lasted used inhaler 06/02/13    Assessment: Patient presented with weakness and fatigue. Trop elevation up to 2,667, CBC stable. Noted thrombocytopenia, appears chronic. Not on anticoagulation prior to admission.   Patient was transitioned to Eliquis 3/30, last dose 3/30 at ~2100. Pharmacy consulted to resume IV heparin. Tentative LHC on 01/17/23. Patient previously therapeutic on IV heparin 850 units/hr. Anticipate heparin levels will be falsely elevated from recent DOAC administration. Therefore, plan to follow aPTT levels until correlation.  Heparin drip  850 uts/hr heparin level 0.8 - falsely elevated from recent apixaban and aptt 46sec less than goal   Goal of Therapy:  Heparin level 0.3-0.7 units/ml aPTT 66-102 seconds Monitor platelets by anticoagulation protocol: Yes   Plan:  Increase  heparin gtt 950 units/hr Daily heparin level/aPTT, CBC, s/s bleeding F/u long term AC plan after cath this week      Bonnita Nasuti Pharm.D. CPP, BCPS Clinical Pharmacist [phone] 01/15/2023 10:51 PM

## 2023-01-15 NOTE — Progress Notes (Signed)
ANTICOAGULATION CONSULT NOTE  Pharmacy Consult for heparin  Indication: chest pain/ACS, new onset afib with RVR   No Known Allergies  Patient Measurements: Height: 5\' 2"  (157.5 cm) Weight: 84.4 kg (186 lb 1.1 oz) IBW/kg (Calculated) : 50.1 Heparin Dosing Weight: 69.2kg   Vital Signs: Temp: 98.3 F (36.8 C) (03/31 1155) Temp Source: Oral (03/31 1155) BP: 161/56 (03/31 0821) Pulse Rate: 60 (03/31 1155)  Labs: Recent Labs    01/12/23 2258 01/12/23 2300 01/13/23 0646 01/13/23 0710 01/13/23 1010 01/13/23 1234 01/14/23 0231 01/15/23 0150  HGB 13.5   < >  --  13.1  --   --  12.4 12.7  HCT 44.0   < >  --  41.7  --   --  39.7 41.0  PLT 173   < >  --  168  --   --  165 157  HEPARINUNFRC  --   --  0.34  --   --   --  0.43  --   CREATININE 0.97  --   --  0.72  --   --  0.78 0.72  CKTOTAL 256*  --   --   --   --   --   --   --   TROPONINIHS 5,742*   < >  --  4,959* 4,191* 2,071*  --   --    < > = values in this interval not displayed.     Estimated Creatinine Clearance: 61.2 mL/min (by C-G formula based on SCr of 0.72 mg/dL).   Medical History: Past Medical History:  Diagnosis Date   Bronchitis    Cigarette smoker    COPD (chronic obstructive pulmonary disease) (HCC)    Pneumonia    Shortness of breath    with exertion; lasted used inhaler 06/02/13    Assessment: Patient presented with weakness and fatigue. Trop elevation up to 2,667, CBC stable. Noted thrombocytopenia, appears chronic. Not on anticoagulation prior to admission.   Patient was transitioned to Eliquis 3/30, last dose 3/30 at ~2100. Pharmacy consulted to resume IV heparin. Tentative LHC on 01/17/23. Patient previously therapeutic on IV heparin 850 units/hr. Will plan to resume at previous rate and check a 6 hr heparin level.   Anticipate heparin levels will be falsely elevated from recent DOAC administration. Therefore, plan to follow aPTT levels until correlation.   Goal of Therapy:  Heparin level  0.3-0.7 units/ml aPTT 66-102 seconds Monitor platelets by anticoagulation protocol: Yes   Plan:  Resume heparin gtt 850 units/hr Check 6 hr heparin level & aPTT  Daily heparin level/aPTT, CBC, s/s bleeding F/u long term [facility] plan  Francena Hanly, PharmD Pharmacy Resident  01/15/2023 1:41 PM

## 2023-01-15 NOTE — Progress Notes (Signed)
   01/15/23 2202  BiPAP/CPAP/SIPAP  $ Non-Invasive Ventilator  Non-Invasive Vent Subsequent  BiPAP/CPAP/SIPAP Pt Type Adult  BiPAP/CPAP/SIPAP V60  Mask Type Full face mask  Mask Size Medium  Set Rate 10 breaths/min  Respiratory Rate 30 breaths/min  IPAP 14 cmH20  EPAP 6 cmH2O  FiO2 (%) 50 %  Minute Ventilation 12.9  Peak Inspiratory Pressure (PIP) 15  Tidal Volume (Vt) 410  CPAP/SIPAP surface wiped down Yes  BiPAP/CPAP /SiPAP Vitals  Pulse Rate 64  Resp (!) 30  SpO2 94 %  Bilateral Breath Sounds Diminished;Clear   Pt placed Pt on V60 BIPAP unit @ doc settings. No distress noted. RT to follow.

## 2023-01-16 ENCOUNTER — Other Ambulatory Visit: Payer: Self-pay

## 2023-01-16 ENCOUNTER — Encounter (HOSPITAL_COMMUNITY): Payer: Self-pay | Admitting: Internal Medicine

## 2023-01-16 DIAGNOSIS — I480 Paroxysmal atrial fibrillation: Secondary | ICD-10-CM | POA: Diagnosis not present

## 2023-01-16 DIAGNOSIS — Z7189 Other specified counseling: Secondary | ICD-10-CM

## 2023-01-16 DIAGNOSIS — R911 Solitary pulmonary nodule: Secondary | ICD-10-CM

## 2023-01-16 DIAGNOSIS — I4891 Unspecified atrial fibrillation: Secondary | ICD-10-CM | POA: Diagnosis not present

## 2023-01-16 DIAGNOSIS — J9621 Acute and chronic respiratory failure with hypoxia: Secondary | ICD-10-CM | POA: Diagnosis not present

## 2023-01-16 DIAGNOSIS — J441 Chronic obstructive pulmonary disease with (acute) exacerbation: Secondary | ICD-10-CM | POA: Diagnosis not present

## 2023-01-16 DIAGNOSIS — R7989 Other specified abnormal findings of blood chemistry: Secondary | ICD-10-CM | POA: Diagnosis not present

## 2023-01-16 DIAGNOSIS — I350 Nonrheumatic aortic (valve) stenosis: Secondary | ICD-10-CM | POA: Diagnosis not present

## 2023-01-16 DIAGNOSIS — I214 Non-ST elevation (NSTEMI) myocardial infarction: Secondary | ICD-10-CM | POA: Diagnosis not present

## 2023-01-16 LAB — CBC
HCT: 40.6 % (ref 36.0–46.0)
Hemoglobin: 12.8 g/dL (ref 12.0–15.0)
MCH: 30.2 pg (ref 26.0–34.0)
MCHC: 31.5 g/dL (ref 30.0–36.0)
MCV: 95.8 fL (ref 80.0–100.0)
Platelets: 139 10*3/uL — ABNORMAL LOW (ref 150–400)
RBC: 4.24 MIL/uL (ref 3.87–5.11)
RDW: 13.2 % (ref 11.5–15.5)
WBC: 12.8 10*3/uL — ABNORMAL HIGH (ref 4.0–10.5)
nRBC: 0 % (ref 0.0–0.2)

## 2023-01-16 LAB — RENAL FUNCTION PANEL
Albumin: 3.1 g/dL — ABNORMAL LOW (ref 3.5–5.0)
Anion gap: 8 (ref 5–15)
BUN: 21 mg/dL (ref 8–23)
CO2: 39 mmol/L — ABNORMAL HIGH (ref 22–32)
Calcium: 9.1 mg/dL (ref 8.9–10.3)
Chloride: 94 mmol/L — ABNORMAL LOW (ref 98–111)
Creatinine, Ser: 0.95 mg/dL (ref 0.44–1.00)
GFR, Estimated: >60 mL/min (ref >60)
Glucose, Bld: 154 mg/dL — ABNORMAL HIGH (ref 70–99)
Phosphorus: 4.3 mg/dL (ref 2.5–4.6)
Potassium: 5.1 mmol/L (ref 3.5–5.1)
Sodium: 141 mmol/L (ref 135–145)

## 2023-01-16 LAB — HEPARIN LEVEL (UNFRACTIONATED): Heparin Unfractionated: 0.76 IU/mL — ABNORMAL HIGH (ref 0.30–0.70)

## 2023-01-16 LAB — GLUCOSE, CAPILLARY
Glucose-Capillary: 115 mg/dL — ABNORMAL HIGH (ref 70–99)
Glucose-Capillary: 117 mg/dL — ABNORMAL HIGH (ref 70–99)
Glucose-Capillary: 136 mg/dL — ABNORMAL HIGH (ref 70–99)
Glucose-Capillary: 169 mg/dL — ABNORMAL HIGH (ref 70–99)
Glucose-Capillary: 232 mg/dL — ABNORMAL HIGH (ref 70–99)
Glucose-Capillary: 276 mg/dL — ABNORMAL HIGH (ref 70–99)

## 2023-01-16 LAB — APTT
aPTT: 58 seconds — ABNORMAL HIGH (ref 24–36)
aPTT: 59 seconds — ABNORMAL HIGH (ref 24–36)

## 2023-01-16 LAB — MAGNESIUM: Magnesium: 1.9 mg/dL (ref 1.7–2.4)

## 2023-01-16 MED ORDER — INSULIN ASPART 100 UNIT/ML IJ SOLN
0.0000 [IU] | Freq: Every day | INTRAMUSCULAR | Status: DC
  Administered 2023-01-16: 2 [IU] via SUBCUTANEOUS

## 2023-01-16 MED ORDER — ROSUVASTATIN CALCIUM 20 MG PO TABS
20.0000 mg | ORAL_TABLET | Freq: Every day | ORAL | Status: DC
  Administered 2023-01-16 – 2023-01-18 (×3): 20 mg via ORAL
  Filled 2023-01-16 (×3): qty 1

## 2023-01-16 MED ORDER — SODIUM CHLORIDE 0.9% FLUSH: 3.0000 mL | INTRAVENOUS | Status: DC | PRN

## 2023-01-16 MED ORDER — ASPIRIN 81 MG PO CHEW
81.0000 mg | CHEWABLE_TABLET | ORAL | Status: AC
  Administered 2023-01-17: 81 mg via ORAL
  Filled 2023-01-16: qty 1

## 2023-01-16 MED ORDER — AMLODIPINE BESYLATE 10 MG PO TABS
10.0000 mg | ORAL_TABLET | Freq: Every day | ORAL | Status: DC
  Administered 2023-01-16 – 2023-01-18 (×3): 10 mg via ORAL
  Filled 2023-01-16 (×3): qty 1

## 2023-01-16 MED ORDER — SODIUM CHLORIDE 0.9 % IV SOLN: 250.0000 mL | INTRAVENOUS | Status: DC | PRN

## 2023-01-16 MED ORDER — INSULIN ASPART 100 UNIT/ML IJ SOLN
0.0000 [IU] | Freq: Three times a day (TID) | INTRAMUSCULAR | Status: DC
  Administered 2023-01-16 – 2023-01-17 (×4): 2 [IU] via SUBCUTANEOUS
  Administered 2023-01-18: 5 [IU] via SUBCUTANEOUS
  Administered 2023-01-18: 2 [IU] via SUBCUTANEOUS

## 2023-01-16 MED ORDER — SODIUM CHLORIDE 0.9 % WEIGHT BASED INFUSION
1.0000 mL/kg/h | INTRAVENOUS | Status: DC
  Administered 2023-01-17: 1 mL/kg/h via INTRAVENOUS

## 2023-01-16 MED ORDER — SODIUM CHLORIDE 0.9% FLUSH
3.0000 mL | Freq: Two times a day (BID) | INTRAVENOUS | Status: DC
  Administered 2023-01-16 – 2023-01-18 (×4): 3 mL via INTRAVENOUS

## 2023-01-16 MED ORDER — SODIUM CHLORIDE 0.9 % WEIGHT BASED INFUSION
3.0000 mL/kg/h | INTRAVENOUS | Status: DC
  Administered 2023-01-17: 3 mL/kg/h via INTRAVENOUS

## 2023-01-16 NOTE — Telephone Encounter (Signed)
ATC LMVTCB x 1  

## 2023-01-16 NOTE — Progress Notes (Signed)
Bipap on standby at this time, on 2lpm [place] with VS within expected range. RT will continue to monitor.

## 2023-01-16 NOTE — Telephone Encounter (Signed)
ATC LVMTCB x 1. Pt needs to be scheduled with Dr. Vaughan Browner in 1 - 2 weeks or first available for hospital follow up. Dr. Vaughan Browner would you like [place] to go ahead place PET order?

## 2023-01-16 NOTE — Progress Notes (Addendum)
Rounding Note    Patient Name: Deanna Petty Date of Encounter: 01/16/2023  [place] Cardiologist: None   Subjective   Feeling better this morning. No chest pain. Breathing has improved.   Inpatient Medications    Scheduled Meds:  arformoterol  15 mcg Nebulization BID   aspirin EC  81 mg Oral Daily   atorvastatin  10 mg Oral QHS   bisoprolol  5 mg Oral Daily   budesonide (PULMICORT) nebulizer solution  0.5 mg Nebulization BID   furosemide  40 mg Intravenous Daily   guaiFENesin  600 mg Oral BID   insulin aspart  0-9 Units Subcutaneous Q4H   pantoprazole  40 mg Oral Daily   revefenacin  175 mcg Nebulization Daily   Continuous Infusions:  cefTRIAXone (ROCEPHIN)  IV 1 g (01/15/23 0956)   heparin 950 Units/hr (01/15/23 2328)   PRN Meds: acetaminophen **OR** acetaminophen, fentaNYL (SUBLIMAZE) injection, HYDROcodone-acetaminophen, ipratropium-albuterol, nicotine polacrilex, ondansetron **OR** ondansetron (ZOFRAN) IV   Vital Signs    Vitals:   01/16/23 0422 01/16/23 0732 01/16/23 0828 01/16/23 0829  BP:  (!) 157/54    Pulse:  (!) 58    Resp:  20    Temp:  98.1 F (36.7 C)    TempSrc:  Oral    SpO2:  96% (!) 89% 91%  Weight: 84.9 kg     Height:        Intake/Output Summary (Last 24 hours) at 01/16/2023 0835 Last data filed at 01/16/2023 0734 Gross per 24 hour  Intake --  Output 4075 ml  Net -4075 ml      01/16/2023    4:22 AM 01/12/2023    9:00 PM 10/26/2022    1:34 PM  Last 3 Weights  Weight (lbs) 187 lb 2.7 oz 186 lb 1.1 oz 186 lb  Weight (kg) 84.9 kg 84.4 kg 84.369 kg      Telemetry    Sinus Rhythm - Personally Reviewed  ECG    No new tracing this morning  Physical Exam   GEN: No acute distress, remains on 2L Comfort Neck: No JVD Cardiac: RRR, soft systolic murmur, no rubs, or gallops.  Respiratory: Mild Expiratory Wheezing & faint basal crackles GI: Soft, nontender, non-distended  MS: No edema; No deformity, bilateral redness  Neuro:   Nonfocal  Psych: Normal affect   Labs    High Sensitivity Troponin:   Recent Labs  Lab 01/12/23 2258 01/13/23 0310 01/13/23 0710 01/13/23 1010 01/13/23 1234  TROPONINIHS 5,742* 6,255* 4,959* 4,191* 2,071*     Chemistry Recent Labs  Lab 01/12/23 1545 01/12/23 1625 01/13/23 0710 01/14/23 0231 01/15/23 0150 01/16/23 0208  NA 144   < > 138 136 139 141  K 2.8*   < > 4.8 5.0 5.4* 5.1  CL 111   < > 98 97* 95* 94*  CO2 25   < > 32 31 33* 39*  GLUCOSE 149*   < > 175* 191* 184* 154*  BUN 11   < > 14 17 16 21   CREATININE 0.61   < > 0.72 0.78 0.72 0.95  CALCIUM 6.2*   < > 8.8* 9.0 9.0 9.1  MG 1.1*   < > 2.0 1.9 2.1 1.9  PROT 3.9*  --  5.9*  --   --   --   ALBUMIN 1.9*  --  2.8* 3.1* 3.2* 3.1*  AST 18  --  47*  --   --   --   ALT 18  --  26  --   --   --   ALKPHOS 46  --  63  --   --   --   BILITOT 0.4  --  0.5  --   --   --   GFRNONAA >60   < > >60 >60 >60 >60  ANIONGAP 8   < > 8 8 11 8    < > = values in this interval not displayed.    Lipids  Recent Labs  Lab 01/13/23 0150  CHOL 92  TRIG 46  HDL 51  LDLCALC 32  CHOLHDL 1.8    Hematology Recent Labs  Lab 01/14/23 0231 01/15/23 0150 01/16/23 0208  WBC 14.7* 13.3* 12.8*  RBC 4.15 4.21 4.24  HGB 12.4 12.7 12.8  HCT 39.7 41.0 40.6  MCV 95.7 97.4 95.8  MCH 29.9 30.2 30.2  MCHC 31.2 31.0 31.5  RDW 13.1 13.2 13.2  PLT 165 157 139*   Thyroid  Recent Labs  Lab 01/12/23 2258  TSH 0.573    BNP Recent Labs  Lab 01/12/23 1545  BNP 140.7*    DDimer No results for input(s): "DDIMER" in the last 168 hours.   Radiology    DG CHEST PORT 1 VIEW  Result Date: 01/15/2023 CLINICAL DATA:  Dyspnea EXAM: PORTABLE CHEST 1 VIEW COMPARISON:  Three days ago FINDINGS: Borderline heart size. Negative aortic and hilar contours accounting for rotation. Increased patchy density in the lungs. Known nodule in the superior segment the left lower lobe, recognized in the chart. No visible effusion or pneumothorax IMPRESSION: 1.  Increased patchy pulmonary density could be pneumonia or developing edema. 2. Known left lower lobe nodule. Electronically Signed   By: Jorje Guild M.D.   On: 01/15/2023 05:07    Cardiac Studies   Echo: 01/13/2023  IMPRESSIONS     1. Left ventricular ejection fraction, by estimation, is 60 to 65%. The  left ventricle has normal function. The left ventricle has no regional  wall motion abnormalities. There is mild concentric left ventricular  hypertrophy. Left ventricular diastolic  parameters are consistent with Grade I diastolic dysfunction (impaired  relaxation).   2. Right ventricular systolic function is normal. The right ventricular  size is normal. Tricuspid regurgitation signal is inadequate for assessing  PA pressure.   3. No evidence of mitral valve regurgitation.   4. There is moderate calcification of the aortic valve. Aortic valve  regurgitation is not visualized. Mild aortic valve stenosis.   5. The inferior vena cava is dilated in size with >50% respiratory  variability, suggesting right atrial pressure of 8 mmHg.   FINDINGS   Left Ventricle: Left ventricular ejection fraction, by estimation, is 60  to 65%. The left ventricle has normal function. The left ventricle has no  regional wall motion abnormalities. Definity contrast agent was given IV  to delineate the left ventricular   endocardial borders. The left ventricular internal cavity size was normal  in size. There is mild concentric left ventricular hypertrophy. Left  ventricular diastolic parameters are consistent with Grade I diastolic  dysfunction (impaired relaxation).   Right Ventricle: The right ventricular size is normal. Right ventricular  systolic function is normal. Tricuspid regurgitation signal is inadequate  for assessing PA pressure.   Left Atrium: Left atrial size was normal in size.   Right Atrium: Right atrial size was normal in size.   Pericardium: There is no evidence of pericardial  effusion.   Mitral Valve: No evidence of mitral valve regurgitation.  Tricuspid Valve: Tricuspid valve regurgitation is not demonstrated.   Aortic Valve: There is moderate calcification of the aortic valve. Aortic  valve regurgitation is not visualized. Mild aortic stenosis is present.  Aortic valve mean gradient measures 12.3 mmHg. Aortic valve peak gradient  measures 19.0 mmHg. Aortic valve  area, by VTI measures 1.26 cm.   Pulmonic Valve: Pulmonic valve regurgitation is not visualized.   Aorta: The aortic root and ascending aorta are structurally normal, with  no evidence of dilitation.   Venous: The inferior vena cava is dilated in size with greater than 50%  respiratory variability, suggesting right atrial pressure of 8 mmHg.   IAS/Shunts: No atrial level shunt detected by color flow Doppler.    Patient Profile     77 y.o. female known to have HTN, DM 2, tobacco abuse, 1.8 cm lung nodule, COPD is currently admitted to hospitalist team for the management of acute on chronic hypoxic respiratory failure secondary to COPD exacerbation versus HFpEF exacerbation and new onset of A-fib with RVR. Cardiology was consulted for elevated troponins.   Assessment & Plan    NSTEMI -- High-sensitivity troponin peaked at 6255 in the setting of atrial fibrillation with RVR acute hypoxic respiratory failure.  EKG with diffuse T wave inversions (noted on prior tracings).  -- remains on IV heparin, aspirin, statin, bisoprolol -- Plan for cardiac catheterization once improved from a respiratory standpoint.  Per previous notes, plan for cath tomorrow.   Shared Decision Making/Informed Consent The risks [stroke (1 in 1000), death (1 in 1000), kidney failure [usually temporary] (1 in 500), bleeding (1 in 200), allergic reaction [possibly serious] (1 in 200)], benefits (diagnostic support and management of coronary artery disease) and alternatives of a cardiac catheterization were discussed in detail  with Ms. Camberos and she is willing to proceed. I have messaged the PCCM team and Dr. Vaughan Browner to clarify thoughts on timing and urgency lung nodule biopsy.  Would like to know what her options are if significant lesion is seen on diagnostic catheterization. If LVEDP is notably elevated, can consider adding right heart cath, but for now would probably just plan for left heart cath and cors   Afib RVR -- Initially presented in atrial fibrillation with RVR which resolved after receiving 10 mg of IV diltiazem.  Remains in sinus rhythm with rates in the 70s. -- Continue IV heparin, plan for transition to Eliquis post cath  Hypertension -- Blood pressures remain elevated -- Continue bisoprolol, added Norvasc -follow BP levels.  Acute hypoxic respiratory failure COPD Chronic O2 dependent --Reports wearing 2 L nasal cannula at all times at home, increases to 5 L with exertion. -- Being treated with nebs, steroids as well as antibiotics per primary.  Improving  Hyperlipidemia -- Increase atorvastatin to 20 mg daily  HFpEF -- Echo showed LVEF of 60 to 65%, no regional wall motion abnormality, grade 1 diastolic dysfunction, normal RV size and function, mild AS Was written for IV Lasix 40 mg daily.  Will hold tomorrow's dose precath, and assess LVEDP plus or minus right heart cath numbers to determine if additional diuretic is needed.  At a minimum would probably be on low-dose oral diuretic.   Left upper lobe lung nodule --Seen by PCCM, plan for outpatient follow-up with Dr. Kimber Relic as well as PET scan with possible navigational bronchoscopy => will already need to be on DOAC for A-fib which can be held for biopsy, but if PCI is indicated then there would also be the question  about antiplatelet agents.  This question needs to be answered prior to cath.-PCCM to see him today     For questions or updates, please contact [place] Please consult [email or web address] for contact info under         Signed, Reino Bellis, NP  01/16/2023, 8:35 AM    ATTENDING ATTESTATION  I have seen, examined and evaluated the patient this morning on rounds along with Reino Bellis, NP-C.  After reviewing all the available data and chart, we discussed the patients laboratory, study & physical findings as well as symptoms in detail.  I agree with her findings, examination as well as impression recommendations as per our discussion.    Attending adjustments noted in italics.   Overall seems to be stabilized out now.  He is in sinus rhythm.  Still on IV heparin which I agree will be converted to [place] following procedures.  Need to get the questions about timing of lung biopsy and urgency etc. determine prior to heart catheterization but I do think that diagnostic cath prior to discharge is reasonable.  Question is what to do if we find a significant lesion that would not be favorable for staged PCI.  Will hold morning dose of Lasix tomorrow in order to assess filling pressures in the Cath Lab to determine dosing of diuretic going forward.    Leonie Man, MD, MS Glenetta Hew, M.D., M.S. Interventional Cardiologist  [place]  Pager # [phone] Phone # [phone] [address] [place], [place] [address]

## 2023-01-16 NOTE — Care Management Important Message (Signed)
Important Message  Patient Details  Name: DANAYE YOUNGREN MRN: [id number] Date of Birth: 03/08/1946   Medicare Important Message Given:  Yes     [name] 01/16/2023, 9:02 AM

## 2023-01-16 NOTE — Telephone Encounter (Signed)
Yes. Please order PET scan. Thanks

## 2023-01-16 NOTE — Progress Notes (Signed)
ANTICOAGULATION CONSULT NOTE  Pharmacy Consult for heparin  Indication: chest pain/ACS, new onset afib with RVR   No Known Allergies  Patient Measurements: Height: 5\' 2"  (157.5 cm) Weight: 84.9 kg (187 lb 2.7 oz) IBW/kg (Calculated) : 50.1 Heparin Dosing Weight: 69.2kg   Vital Signs: Temp: 98.1 F (36.7 C) (04/01 0732) Temp Source: Oral (04/01 0732) BP: 157/54 (04/01 0732) Pulse Rate: 58 (04/01 0732)  Labs: Recent Labs    01/13/23 1010 01/13/23 1234 01/14/23 0231 01/14/23 0231 01/15/23 0150 01/15/23 2119 01/16/23 0208 01/16/23 0759  HGB  --   --  12.4   < > 12.7  --  12.8  --   HCT  --   --  39.7  --  41.0  --  40.6  --   PLT  --   --  165  --  157  --  139*  --   APTT  --   --   --   --   --  46* 58* 59*  HEPARINUNFRC  --   --  0.43  --   --  0.92* 0.76*  --   CREATININE  --   --  0.78  --  0.72  --  0.95  --   TROPONINIHS 4,191* 2,071*  --   --   --   --   --   --    < > = values in this interval not displayed.    Estimated Creatinine Clearance: 51.7 mL/min (by C-G formula based on SCr of 0.95 mg/dL).   Medical History: Past Medical History:  Diagnosis Date   Bronchitis    Cigarette smoker    COPD (chronic obstructive pulmonary disease) (HCC)    Pneumonia    Shortness of breath    with exertion; lasted used inhaler 06/02/13    Assessment: Patient presented with weakness and fatigue. Trop elevation up to 2,667, CBC stable. Noted thrombocytopenia, appears chronic. Not on anticoagulation prior to admission.   Patient was transitioned to Eliquis 3/30, last dose 3/30 at ~2100. Pharmacy consulted to resume IV heparin. aPTT is slightly below goal at 59sec on heparin 950 units/hr, heparin level 0.76. Anticipate heparin level still falsely elevated from recent DOAC administration.   Goal of Therapy:  Heparin level 0.3-0.7 units/ml aPTT 66-102 seconds Monitor platelets by anticoagulation protocol: Yes   Plan:  Increase heparin gtt to 1050 units/hr Daily  heparin level/aPTT, CBC, s/s bleeding F/u long term AC plan after cath this week   Park Liter, PharmD Candidate 01/16/2023 9:04 AM

## 2023-01-16 NOTE — Progress Notes (Signed)
Occupational Therapy Treatment Patient Details Name: Deanna Petty MRN: [id number] DOB: [birth year] Today's Date: 01/16/2023   History of present illness 77 year old admitted for SOB and new onset Afib, COPD exacerbation and NSTEMI.  W/U also revealed nodule in LUL concerning for bronchogenic carcinoma.  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   OT comments  Pt continuing to progress towards patient focused goals. Pt completing functional ambulation in hallway with Min guard to Supervision level depending on BLE fatigue, vitals were not accurately picking up but Pt denied any SOB. Pt will have surgery 01/17/23 per MD, may receive heart cath depending on results, OT will continue to follow-up to determine any changes in DC or functional status. At this time patient would benefit from continued acute skilled OT services to address above deficits and help transition to next level of care. No follow-up OT recommended at this time, will determine any further needs pending status after surgery.   Recommendations for follow up therapy are one component of a multi-disciplinary discharge planning process, led by the attending physician.  Recommendations may be updated based on patient status, additional functional criteria and insurance authorization.    Assistance Recommended at Discharge Intermittent Supervision/Assistance  Patient can return home with the following  A little help with walking and/or transfers;A little help with bathing/dressing/bathroom;Assistance with cooking/housework;Assist for transportation;Help with stairs or ramp for entrance   Equipment Recommendations  None recommended by OT    Recommendations for Other Services      Precautions / Restrictions Precautions Precautions: Fall Precaution Comments: 2L 02 Restrictions Weight Bearing Restrictions: No       Mobility Bed  Mobility Overal bed mobility: Needs Assistance Bed Mobility: Supine to Sit, Sit to Supine     Supine to sit: Modified independent (Device/Increase time) Sit to supine: Modified independent (Device/Increase time)        Transfers Overall transfer level: Needs assistance Equipment used: Quad cane Transfers: Sit to/from Stand Sit to Stand: Modified independent (Device/Increase time)                 Balance Overall balance assessment: Needs assistance Sitting-balance support: No upper extremity supported, Feet supported Sitting balance-Leahy Scale: Good     Standing balance support: Single extremity supported, During functional activity, Reliant on assistive device for balance Standing balance-Leahy Scale: Poor Standing balance comment: quad cane                           ADL either performed or assessed with clinical judgement   ADL Overall ADL's : Needs assistance/impaired Eating/Feeding: Independent;Sitting Eating/Feeding Details (indicate cue type and reason): sitting EOB             Upper Body Dressing : Sitting;Set up;Supervision/safety Upper Body Dressing Details (indicate cue type and reason): Donned gown in jacket like fashion                 Functional mobility during ADLs: Supervision/safety;Cane General ADL Comments: Min guard assist provided following fatigue, mild balance deficits were observed    Extremity/Trunk Assessment              Vision       Perception     Praxis      Cognition Arousal/Alertness: Awake/alert Behavior During Therapy: WFL for tasks assessed/performed Overall Cognitive Status: Within Functional Limits for tasks assessed  Exercises      Shoulder Instructions       General Comments Sp02 85% but not reading accurately/fluctuating during functional ambulation on 2L, Pt denied any SOB. Sp02 at 94% when sitting EOB at end of OT session     Pertinent Vitals/ Pain       Pain Assessment Pain Assessment: No/denies pain  Home Living                                          Prior Functioning/Environment              Frequency  Min 2X/week        Progress Toward Goals  OT Goals(current goals can now be found in the care plan section)  Progress towards OT goals: Progressing toward goals  Acute Rehab OT Goals Patient Stated Goal: go home OT Goal Formulation: With patient  Plan Discharge plan remains appropriate;Frequency remains appropriate    Co-evaluation                 AM-PAC OT "6 Clicks" Daily Activity     Outcome Measure   Help from another person eating meals?: None Help from another person taking care of personal grooming?: A Little Help from another person toileting, which includes using toliet, bedpan, or urinal?: A Little Help from another person bathing (including washing, rinsing, drying)?: A Little Help from another person to put on and taking off regular upper body clothing?: A Little Help from another person to put on and taking off regular lower body clothing?: A Little 6 Click Score: 19    End of Session Equipment Utilized During Treatment: Gait belt;Oxygen (quad cane and 2L 02)  OT Visit Diagnosis: Unsteadiness on feet (R26.81);Muscle weakness (generalized) (M62.81);Other abnormalities of gait and mobility (R26.89)   Activity Tolerance Patient tolerated treatment well   Patient Left in bed;with call bell/phone within reach (sitting EOB eating)   Nurse Communication Mobility status        Time: 1202-1228 OT Time Calculation (min): 26 min  Charges: OT General Charges $OT Visit: 1 Visit OT Treatments $Therapeutic Activity: 23-37 mins  01/16/2023  AB, OTR/L  Acute Rehabilitation Services  Office: [phone]   Cori Razor 01/16/2023, 12:55 PM

## 2023-01-16 NOTE — Telephone Encounter (Signed)
Patient is currently admitted in hospital at [facility].  Will follow up with PET order and scheduling patient after discharge home.

## 2023-01-16 NOTE — H&P (View-Only) (Signed)
Rounding Note    Patient Name: Deanna Petty Date of Encounter: 01/16/2023  [place] Cardiologist: None   Subjective   Feeling better this morning. No chest pain. Breathing has improved.   Inpatient Medications    Scheduled Meds:  arformoterol  15 mcg Nebulization BID   aspirin EC  81 mg Oral Daily   atorvastatin  10 mg Oral QHS   bisoprolol  5 mg Oral Daily   budesonide (PULMICORT) nebulizer solution  0.5 mg Nebulization BID   furosemide  40 mg Intravenous Daily   guaiFENesin  600 mg Oral BID   insulin aspart  0-9 Units Subcutaneous Q4H   pantoprazole  40 mg Oral Daily   revefenacin  175 mcg Nebulization Daily   Continuous Infusions:  cefTRIAXone (ROCEPHIN)  IV 1 g (01/15/23 0956)   heparin 950 Units/hr (01/15/23 2328)   PRN Meds: acetaminophen **OR** acetaminophen, fentaNYL (SUBLIMAZE) injection, HYDROcodone-acetaminophen, ipratropium-albuterol, nicotine polacrilex, ondansetron **OR** ondansetron (ZOFRAN) IV   Vital Signs    Vitals:   01/16/23 0422 01/16/23 0732 01/16/23 0828 01/16/23 0829  BP:  (!) 157/54    Pulse:  (!) 58    Resp:  20    Temp:  98.1 F (36.7 C)    TempSrc:  Oral    SpO2:  96% (!) 89% 91%  Weight: 84.9 kg     Height:        Intake/Output Summary (Last 24 hours) at 01/16/2023 0835 Last data filed at 01/16/2023 0734 Gross per 24 hour  Intake --  Output 4075 ml  Net -4075 ml      01/16/2023    4:22 AM 01/12/2023    9:00 PM 10/26/2022    1:34 PM  Last 3 Weights  Weight (lbs) 187 lb 2.7 oz 186 lb 1.1 oz 186 lb  Weight (kg) 84.9 kg 84.4 kg 84.369 kg      Telemetry    Sinus Rhythm - Personally Reviewed  ECG    No new tracing this morning  Physical Exam   GEN: No acute distress, remains on 2L [place] Neck: No JVD Cardiac: RRR, soft systolic murmur, no rubs, or gallops.  Respiratory: Mild Expiratory Wheezing & faint basal crackles GI: Soft, nontender, non-distended  MS: No edema; No deformity, bilateral redness  Neuro:   Nonfocal  Psych: Normal affect   Labs    High Sensitivity Troponin:   Recent Labs  Lab 01/12/23 2258 01/13/23 0310 01/13/23 0710 01/13/23 1010 01/13/23 1234  TROPONINIHS 5,742* 6,255* 4,959* 4,191* 2,071*     Chemistry Recent Labs  Lab 01/12/23 1545 01/12/23 1625 01/13/23 0710 01/14/23 0231 01/15/23 0150 01/16/23 0208  NA 144   < > 138 136 139 141  K 2.8*   < > 4.8 5.0 5.4* 5.1  CL 111   < > 98 97* 95* 94*  CO2 25   < > 32 31 33* 39*  GLUCOSE 149*   < > 175* 191* 184* 154*  BUN 11   < > 14 17 16 21   CREATININE 0.61   < > 0.72 0.78 0.72 0.95  CALCIUM 6.2*   < > 8.8* 9.0 9.0 9.1  MG 1.1*   < > 2.0 1.9 2.1 1.9  PROT 3.9*  --  5.9*  --   --   --   ALBUMIN 1.9*  --  2.8* 3.1* 3.2* 3.1*  AST 18  --  47*  --   --   --   ALT 18  --  26  --   --   --   ALKPHOS 46  --  63  --   --   --   BILITOT 0.4  --  0.5  --   --   --   GFRNONAA >60   < > >60 >60 >60 >60  ANIONGAP 8   < > 8 8 11 8    < > = values in this interval not displayed.    Lipids  Recent Labs  Lab 01/13/23 0150  CHOL 92  TRIG 46  HDL 51  LDLCALC 32  CHOLHDL 1.8    Hematology Recent Labs  Lab 01/14/23 0231 01/15/23 0150 01/16/23 0208  WBC 14.7* 13.3* 12.8*  RBC 4.15 4.21 4.24  HGB 12.4 12.7 12.8  HCT 39.7 41.0 40.6  MCV 95.7 97.4 95.8  MCH 29.9 30.2 30.2  MCHC 31.2 31.0 31.5  RDW 13.1 13.2 13.2  PLT 165 157 139*   Thyroid  Recent Labs  Lab 01/12/23 2258  TSH 0.573    BNP Recent Labs  Lab 01/12/23 1545  BNP 140.7*    DDimer No results for input(s): "DDIMER" in the last 168 hours.   Radiology    DG CHEST PORT 1 VIEW  Result Date: 01/15/2023 CLINICAL DATA:  Dyspnea EXAM: PORTABLE CHEST 1 VIEW COMPARISON:  Three days ago FINDINGS: Borderline heart size. Negative aortic and hilar contours accounting for rotation. Increased patchy density in the lungs. Known nodule in the superior segment the left lower lobe, recognized in the chart. No visible effusion or pneumothorax IMPRESSION: 1.  Increased patchy pulmonary density could be pneumonia or developing edema. 2. Known left lower lobe nodule. Electronically Signed   By: Jorje Guild M.D.   On: 01/15/2023 05:07    Cardiac Studies   Echo: 01/13/2023  IMPRESSIONS     1. Left ventricular ejection fraction, by estimation, is 60 to 65%. The  left ventricle has normal function. The left ventricle has no regional  wall motion abnormalities. There is mild concentric left ventricular  hypertrophy. Left ventricular diastolic  parameters are consistent with Grade I diastolic dysfunction (impaired  relaxation).   2. Right ventricular systolic function is normal. The right ventricular  size is normal. Tricuspid regurgitation signal is inadequate for assessing  PA pressure.   3. No evidence of mitral valve regurgitation.   4. There is moderate calcification of the aortic valve. Aortic valve  regurgitation is not visualized. Mild aortic valve stenosis.   5. The inferior vena cava is dilated in size with >50% respiratory  variability, suggesting right atrial pressure of 8 mmHg.   FINDINGS   Left Ventricle: Left ventricular ejection fraction, by estimation, is 60  to 65%. The left ventricle has normal function. The left ventricle has no  regional wall motion abnormalities. Definity contrast agent was given IV  to delineate the left ventricular   endocardial borders. The left ventricular internal cavity size was normal  in size. There is mild concentric left ventricular hypertrophy. Left  ventricular diastolic parameters are consistent with Grade I diastolic  dysfunction (impaired relaxation).   Right Ventricle: The right ventricular size is normal. Right ventricular  systolic function is normal. Tricuspid regurgitation signal is inadequate  for assessing PA pressure.   Left Atrium: Left atrial size was normal in size.   Right Atrium: Right atrial size was normal in size.   Pericardium: There is no evidence of pericardial  effusion.   Mitral Valve: No evidence of mitral valve regurgitation.  Tricuspid Valve: Tricuspid valve regurgitation is not demonstrated.   Aortic Valve: There is moderate calcification of the aortic valve. Aortic  valve regurgitation is not visualized. Mild aortic stenosis is present.  Aortic valve mean gradient measures 12.3 mmHg. Aortic valve peak gradient  measures 19.0 mmHg. Aortic valve  area, by VTI measures 1.26 cm.   Pulmonic Valve: Pulmonic valve regurgitation is not visualized.   Aorta: The aortic root and ascending aorta are structurally normal, with  no evidence of dilitation.   Venous: The inferior vena cava is dilated in size with greater than 50%  respiratory variability, suggesting right atrial pressure of 8 mmHg.   IAS/Shunts: No atrial level shunt detected by color flow Doppler.    Patient Profile     77 y.o. female known to have HTN, DM 2, tobacco abuse, 1.8 cm lung nodule, COPD is currently admitted to hospitalist team for the management of acute on chronic hypoxic respiratory failure secondary to COPD exacerbation versus HFpEF exacerbation and new onset of A-fib with RVR. Cardiology was consulted for elevated troponins.   Assessment & Plan    NSTEMI -- High-sensitivity troponin peaked at 6255 in the setting of atrial fibrillation with RVR acute hypoxic respiratory failure.  EKG with diffuse T wave inversions (noted on prior tracings).  -- remains on IV heparin, aspirin, statin, bisoprolol -- Plan for cardiac catheterization once improved from a respiratory standpoint.  Per previous notes, plan for cath tomorrow.   Shared Decision Making/Informed Consent The risks [stroke (1 in 1000), death (1 in 1000), kidney failure [usually temporary] (1 in 500), bleeding (1 in 200), allergic reaction [possibly serious] (1 in 200)], benefits (diagnostic support and management of coronary artery disease) and alternatives of a cardiac catheterization were discussed in detail  with Ms. Templer and she is willing to proceed. I have messaged the PCCM team and Dr. Vaughan Browner to clarify thoughts on timing and urgency lung nodule biopsy.  Would like to know what her options are if significant lesion is seen on diagnostic catheterization. If LVEDP is notably elevated, can consider adding right heart cath, but for now would probably just plan for left heart cath and cors   Afib RVR -- Initially presented in atrial fibrillation with RVR which resolved after receiving 10 mg of IV diltiazem.  Remains in sinus rhythm with rates in the 70s. -- Continue IV heparin, plan for transition to Eliquis post cath  Hypertension -- Blood pressures remain elevated -- Continue bisoprolol, added Norvasc -follow BP levels.  Acute hypoxic respiratory failure COPD Chronic O2 dependent --Reports wearing 2 L nasal cannula at all times at home, increases to 5 L with exertion. -- Being treated with nebs, steroids as well as antibiotics per primary.  Improving  Hyperlipidemia -- Increase atorvastatin to 20 mg daily  HFpEF -- Echo showed LVEF of 60 to 65%, no regional wall motion abnormality, grade 1 diastolic dysfunction, normal RV size and function, mild AS Was written for IV Lasix 40 mg daily.  Will hold tomorrow's dose precath, and assess LVEDP plus or minus right heart cath numbers to determine if additional diuretic is needed.  At a minimum would probably be on low-dose oral diuretic.   Left upper lobe lung nodule --Seen by PCCM, plan for outpatient follow-up with Dr. Kimber Relic as well as PET scan with possible navigational bronchoscopy => will already need to be on DOAC for A-fib which can be held for biopsy, but if PCI is indicated then there would also be the question  about antiplatelet agents.  This question needs to be answered prior to cath.-PCCM to see him today     For questions or updates, please contact [place] Please consult [email or web address] for contact info under         Signed, Reino Bellis, NP  01/16/2023, 8:35 AM    ATTENDING ATTESTATION  I have seen, examined and evaluated the patient this morning on rounds along with Reino Bellis, NP-C.  After reviewing all the available data and chart, we discussed the patients laboratory, study & physical findings as well as symptoms in detail.  I agree with her findings, examination as well as impression recommendations as per our discussion.    Attending adjustments noted in italics.   Overall seems to be stabilized out now.  He is in sinus rhythm.  Still on IV heparin which I agree will be converted to [place] following procedures.  Need to get the questions about timing of lung biopsy and urgency etc. determine prior to heart catheterization but I do think that diagnostic cath prior to discharge is reasonable.  Question is what to do if we find a significant lesion that would not be favorable for staged PCI.  Will hold morning dose of Lasix tomorrow in order to assess filling pressures in the Cath Lab to determine dosing of diuretic going forward.    Leonie Man, MD, MS Glenetta Hew, M.D., M.S. Interventional Cardiologist  [place]  Pager # [phone] Phone # [phone] [address]. [place], [place] [employer] [address]

## 2023-01-16 NOTE — Progress Notes (Signed)
PROGRESS NOTE  Deanna Petty [id number] DOB: Jun 18, 1946   PCP: Pcp, No  Patient is from: Home.  Uses cane at baseline  DOA: 01/12/2023 LOS: 4  Chief complaints Chief Complaint  Patient presents with   Tachycardia    AFIB     Brief Narrative / Interim history: 77 year old F with PMH of COPD/chronic hypoxic RF on 3 L, moderate AS, DM-2, tobacco use disorder (lifetime smoker), HTN, HLD and obesity presenting with cough, shortness of breath, nausea, weakness and increased heart rate, and admitted for new onset A-fib with RVR, COPD exacerbation and NSTEMI.  In ED, in RVR to 151.  RR 34.  Desaturated to 81% on home 3 L.  VBG with acute on chronic respiratory acidosis.  K2.8.  Mg 1.1.  BNP 140.  Troponin 20 and trended up to 2700.  EKG in A-fib with RVR to 149.  Repeat EKG sinus rhythm with RBBB and TWI in inferolateral leads.  CTA chest negative for PE but enlarging spiculated 1.8 cm nodule in LUL concerning for bronchogenic carcinoma.  Patient converted to sinus rhythm after IV Cardizem push.  Started on IV heparin and COPD meds.  Cardiology consulted.  TTE ordered.  TTE without significant finding. Pulmonology evaluated patient and recommended close follow-up outpatient for LUL mass.  Patient is back on IV heparin for non-STEMI.  Cardiology planning [facility] on 4/2.  Patient wants to be full code.    Subjective: Seen and examined earlier this morning.  No major events overnight of this morning.  No complaints.  Feels better.  She denies chest pain.  Breathing has improved.  About 4.5 L UOP/24 hours on IV Lasix.  Creatinine slightly up.  Objective: Vitals:   01/16/23 0422 01/16/23 0732 01/16/23 0828 01/16/23 0829  BP:  (!) 157/54    Pulse:  (!) 58    Resp:  20    Temp:  98.1 F (36.7 C)    TempSrc:  Oral    SpO2:  96% (!) 89% 91%  Weight: 84.9 kg     Height:        Examination:  GENERAL: No apparent distress.  Nontoxic. HEENT: MMM.  Vision and hearing grossly intact.  NECK:  Supple.  No apparent JVD.  RESP:  No IWOB.  Fair aeration bilaterally.  Crackles and rhonchi. CVS:  RRR. Heart sounds normal.  ABD/GI/GU: BS+. Abd soft, NTND.  MSK/EXT:   No apparent deformity. Moves extremities. No edema.  SKIN: no apparent skin lesion or wound NEURO: Awake and alert. Oriented appropriately.  No apparent focal neuro deficit. PSYCH: Calm. Normal affect.   Procedures:  None  Microbiology summarized: [id number], influenza and RSV PCR nonreactive  Assessment and plan: Principal Problem:   COPD with acute exacerbation Active Problems:   Tobacco use   Type 2 diabetes mellitus with hyperlipidemia   Elevated brain natriuretic peptide (BNP) level   Acute on chronic respiratory failure with hypoxia and hypercapnia   Essential hypertension   Hypomagnesemia   New onset atrial fibrillation   Myocardial infarction due to demand ischemia   Non-STEMI (non-ST elevated myocardial infarction)   Hypokalemia   Acute heart failure with preserved ejection fraction (HFpEF)  New onset A-fib with RVR: Converted to sinus rhythm after IV Cardizem push in ED.  Remains in sinus rhythm.  Could be due to COPD exacerbation and electrolyte derangement.  TTE without significant finding.  CHA2DS2-VASc score > 5 -Cardiology on board-IV heparin and Zebeta -Optimize electrolytes  Non-STEMI: Patient without chest pain but  the DOE.  Troponin peaked at 6000 and started to trend down.  EKG with TWI in inferolateral leads but not new.  TTE without significant finding.  LDL 32.  A1c 7.4%. -Cardiology on board-continue IV heparin, Zebeta, aspirin and Crestor -Plan for [facility] on 4/2 -Patient likes to reverse her CODE STATUS to full code during this hospitalization  COPD exacerbation: Likely due to ongoing cigarette smoking.  Lifetime smoker.  Reports good compliance -Continue IV Solu-Medrol CTX, Brovana, Pulmicort and Yupelri -Continue DuoNebs as needed -Discussed the importance of smoking  cessation  Acute on chronic HFpEF: Patient continues to have significant respiratory distress.  CXR suggesting CHF BNP slightly elevated.  Started on IV Lasix.  Net -5 L.  Creatinine slightly up. -Defer diuretics to cardiology -Strict intake and output, daily weight, renal functions and electrolytes  Acute on chronic respiratory failure with hypoxia and hypercapnia: On 3 L at baseline.  Currently on 2 L at rest. -Minimum oxygen to keep saturation above 88% regardless of home 3 L due to risk of CO2 retention -BiPAP at night -Incentive spirometry/OOB/PT/OT -COPD and CHF management as above  Lung nodule: CT angio showed 1.8 cm enlarging and spiculated LUL nodule concerning for bronchogenic carcinoma.  Followed by Dr. Kimber Relic. -PCCM consulted and recommended close follow-up outpatient on discharge.  Hypokalemia/hypomagnesemia: Resolved -Monitor replenish as appropriate  NIDDM-2 with hyperglycemia and hyperlipidemia: A1c is 1.4%.  On metformin at home. Recent Labs  Lab 01/15/23 1631 01/15/23 2319 01/16/23 0405 01/16/23 0731 01/16/23 1115  GLUCAP 140* 224* 117* 115* 169*  -Increase SSI to moderate -Continue statin  Tobacco use disorder: Lifetime smoker.  Over 100-pack-year history -Encouraged smoking cessation -Likes to try nicotine gums.  Reports skin irritation with nicotine patch  Essential hypertension: Normotensive. -Continue Zebeta and amlodipine per cardiology  Physical deconditioning: Uses cane at baseline -PT/OT  Hyperkalemia: Resolved  Goal of care: Patient changed CODE STATUS to DNR/DNI after talking to pulmonology on 3/29.  Now patient likes to reverse CODE STATUS to full code for the remainder of this hospitalization in light of upcoming cardiac procedure -CODE STATUS changed to full code.  Obesity Body mass index is 34.23 kg/m. Nutrition Problem: Predicted suboptimal nutrient intake Etiology:  (hospitalization) Signs/Symptoms:  (recent COPD exacerbation and  lung nodule) Interventions: Education   DVT prophylaxis:  SCDs Start: 01/13/23 0659  Code Status: DNR/DNI Family Communication: None at bedside Level of care: Telemetry Cardiac Status is: Inpatient Remains inpatient appropriate because: Respiratory failure, non-STEMI, A-fib and COPD exacerbation   Final disposition: Home Consultants:  Cardiology PCCM  55 minutes with more than 50% spent in reviewing records, counseling patient/family and coordinating care.   Sch Meds:  Scheduled Meds:  amLODipine  10 mg Oral Daily   arformoterol  15 mcg Nebulization BID   aspirin EC  81 mg Oral Daily   bisoprolol  5 mg Oral Daily   budesonide (PULMICORT) nebulizer solution  0.5 mg Nebulization BID   guaiFENesin  600 mg Oral BID   insulin aspart  0-9 Units Subcutaneous Q4H   pantoprazole  40 mg Oral Daily   revefenacin  175 mcg Nebulization Daily   rosuvastatin  20 mg Oral Daily   sodium chloride flush  3 mL Intravenous Q12H   Continuous Infusions:  cefTRIAXone (ROCEPHIN)  IV 1 g (01/16/23 0910)   heparin 1,050 Units/hr (01/16/23 1056)   PRN Meds:.acetaminophen **OR** acetaminophen, fentaNYL (SUBLIMAZE) injection, HYDROcodone-acetaminophen, ipratropium-albuterol, nicotine polacrilex, ondansetron **OR** ondansetron (ZOFRAN) IV  Antimicrobials: Anti-infectives (From admission, onward)  Start     Dose/Rate Route Frequency Ordered Stop   01/13/23 0658  cefTRIAXone (ROCEPHIN) 1 g in sodium chloride 0.9 % 100 mL IVPB        1 g 200 mL/hr over 30 Minutes Intravenous Every 24 hours 01/13/23 0658 01/18/23 0659        I have personally reviewed the following labs and images: CBC: Recent Labs  Lab 01/12/23 1545 01/12/23 1625 01/12/23 2258 01/12/23 2300 01/13/23 0150 01/13/23 0710 01/14/23 0231 01/15/23 0150 01/16/23 0208  WBC 7.0  --  8.4  --  8.4 10.0 14.7* 13.3* 12.8*  NEUTROABS 4.9  --  7.7  --   --   --   --   --   --   HGB 10.2*   < > 13.5   < > 12.7 13.1 12.4 12.7 12.8   HCT 33.3*   < > 44.0   < > 40.8 41.7 39.7 41.0 40.6  MCV 99.4  --  97.8  --  96.9 96.1 95.7 97.4 95.8  PLT 115*  --  173  --  165 168 165 157 139*   < > = values in this interval not displayed.   BMP &GFR Recent Labs  Lab 01/12/23 2258 01/12/23 2300 01/13/23 0710 01/14/23 0231 01/15/23 0150 01/16/23 0208  NA 136 137 138 136 139 141  K 5.4* 5.3* 4.8 5.0 5.4* 5.1  CL 92*  --  98 97* 95* 94*  CO2 33*  --  32 31 33* 39*  GLUCOSE 307*  --  175* 191* 184* 154*  BUN 15  --  14 17 16 21   CREATININE 0.97  --  0.72 0.78 0.72 0.95  CALCIUM 9.2  --  8.8* 9.0 9.0 9.1  MG 2.1  --  2.0 1.9 2.1 1.9  PHOS 4.3  --  3.5 3.4 4.4 4.3   Estimated Creatinine Clearance: 51.7 mL/min (by C-G formula based on SCr of 0.95 mg/dL). Liver & Pancreas: Recent Labs  Lab 01/12/23 1545 01/13/23 0710 01/14/23 0231 01/15/23 0150 01/16/23 0208  AST 18 47*  --   --   --   ALT 18 26  --   --   --   ALKPHOS 46 63  --   --   --   BILITOT 0.4 0.5  --   --   --   PROT 3.9* 5.9*  --   --   --   ALBUMIN 1.9* 2.8* 3.1* 3.2* 3.1*   No results for input(s): "LIPASE", "AMYLASE" in the last 168 hours. No results for input(s): "AMMONIA" in the last 168 hours. Diabetic: No results for input(s): "HGBA1C" in the last 72 hours.  Recent Labs  Lab 01/15/23 1631 01/15/23 2319 01/16/23 0405 01/16/23 0731 01/16/23 1115  GLUCAP 140* 224* 117* 115* 169*   Cardiac Enzymes: Recent Labs  Lab 01/12/23 2258  CKTOTAL 256*   No results for input(s): "PROBNP" in the last 8760 hours. Coagulation Profile: No results for input(s): "INR", "PROTIME" in the last 168 hours. Thyroid Function Tests: No results for input(s): "TSH", "T4TOTAL", "FREET4", "T3FREE", "THYROIDAB" in the last 72 hours.  Lipid Profile: No results for input(s): "CHOL", "HDL", "LDLCALC", "TRIG", "CHOLHDL", "LDLDIRECT" in the last 72 hours.  Anemia Panel: No results for input(s): "VITAMINB12", "FOLATE", "FERRITIN", "TIBC", "IRON", "RETICCTPCT" in the  last 72 hours.  Urine analysis:    Component Value Date/Time   COLORURINE YELLOW 01/12/2023 2255   APPEARANCEUR HAZY (A) 01/12/2023 2255   LABSPEC >1.046 (H)  01/12/2023 2255   PHURINE 6.0 01/12/2023 2255   GLUCOSEU 50 (A) 01/12/2023 2255   HGBUR SMALL (A) 01/12/2023 2255   BILIRUBINUR NEGATIVE 01/12/2023 2255   KETONESUR NEGATIVE 01/12/2023 2255   PROTEINUR 100 (A) 01/12/2023 2255   NITRITE NEGATIVE 01/12/2023 2255   LEUKOCYTESUR NEGATIVE 01/12/2023 2255   Sepsis Labs: Invalid input(s): "PROCALCITONIN", "LACTICIDVEN"  Microbiology: Recent Results (from the past 240 hour(s))  Resp panel by RT-PCR (RSV, Flu A&B, Covid) Anterior Nasal Swab     Status: None   Collection Time: 01/12/23  3:40 PM   Specimen: Anterior Nasal Swab  Result Value Ref Range Status   SARS Coronavirus 2 by RT PCR NEGATIVE NEGATIVE Final   Influenza A by PCR NEGATIVE NEGATIVE Final   Influenza B by PCR NEGATIVE NEGATIVE Final    Comment: (NOTE) The Xpert Xpress SARS-CoV-2/FLU/RSV plus assay is intended as an aid in the diagnosis of influenza from Nasopharyngeal swab specimens and should not be used as a sole basis for treatment. Nasal washings and aspirates are unacceptable for Xpert Xpress SARS-CoV-2/FLU/RSV testing.  Fact Sheet for Patients: EntrepreneurPulse.com.au  Fact Sheet for Healthcare Providers: IncredibleEmployment.be  This test is not yet approved or cleared by the [place] FDA and has been authorized for detection and/or diagnosis of SARS-CoV-2 by FDA under an Emergency Use Authorization (EUA). This EUA will remain in effect (meaning this test can be used) for the duration of the COVID-19 declaration under Section 564(b)(1) of the Act, 21 U.S.C. section 360bbb-3(b)(1), unless the authorization is terminated or revoked.     Resp Syncytial Virus by PCR NEGATIVE NEGATIVE Final    Comment: (NOTE) Fact Sheet for  Patients: EntrepreneurPulse.com.au  Fact Sheet for Healthcare Providers: IncredibleEmployment.be  This test is not yet approved or cleared by the [place] FDA and has been authorized for detection and/or diagnosis of SARS-CoV-2 by FDA under an Emergency Use Authorization (EUA). This EUA will remain in effect (meaning this test can be used) for the duration of the COVID-19 declaration under Section 564(b)(1) of the Act, 21 U.S.C. section 360bbb-3(b)(1), unless the authorization is terminated or revoked.  Performed at [facility], [place] [address] [place], [place] [address]     Radiology Studies: No results found.    Robel Wuertz T. [place]  If 7PM-7AM, please contact night-coverage [email or web address] 01/16/2023, 1:15 PM

## 2023-01-17 ENCOUNTER — Encounter (HOSPITAL_COMMUNITY)
Admission: EM | Disposition: A | Discharge status: Home / Self Care | Payer: Self-pay | Source: Home / Self Care | Attending: Student

## 2023-01-17 ENCOUNTER — Encounter (HOSPITAL_COMMUNITY): Payer: Self-pay | Admitting: Internal Medicine

## 2023-01-17 ENCOUNTER — Other Ambulatory Visit (HOSPITAL_COMMUNITY): Payer: Self-pay

## 2023-01-17 DIAGNOSIS — I2510 Atherosclerotic heart disease of native coronary artery without angina pectoris: Secondary | ICD-10-CM

## 2023-01-17 DIAGNOSIS — J9621 Acute and chronic respiratory failure with hypoxia: Secondary | ICD-10-CM | POA: Diagnosis not present

## 2023-01-17 DIAGNOSIS — I480 Paroxysmal atrial fibrillation: Secondary | ICD-10-CM | POA: Diagnosis not present

## 2023-01-17 DIAGNOSIS — J441 Chronic obstructive pulmonary disease with (acute) exacerbation: Secondary | ICD-10-CM | POA: Diagnosis not present

## 2023-01-17 DIAGNOSIS — I4891 Unspecified atrial fibrillation: Secondary | ICD-10-CM | POA: Diagnosis not present

## 2023-01-17 DIAGNOSIS — I214 Non-ST elevation (NSTEMI) myocardial infarction: Secondary | ICD-10-CM | POA: Diagnosis not present

## 2023-01-17 DIAGNOSIS — I350 Nonrheumatic aortic (valve) stenosis: Secondary | ICD-10-CM | POA: Diagnosis not present

## 2023-01-17 DIAGNOSIS — I5031 Acute diastolic (congestive) heart failure: Secondary | ICD-10-CM | POA: Diagnosis not present

## 2023-01-17 HISTORY — PX: LEFT HEART CATH AND CORONARY ANGIOGRAPHY: CATH118249

## 2023-01-17 LAB — RENAL FUNCTION PANEL
Albumin: 2.8 g/dL — ABNORMAL LOW (ref 3.5–5.0)
Anion gap: 9 (ref 5–15)
BUN: 17 mg/dL (ref 8–23)
CO2: 36 mmol/L — ABNORMAL HIGH (ref 22–32)
Calcium: 8.7 mg/dL — ABNORMAL LOW (ref 8.9–10.3)
Chloride: 96 mmol/L — ABNORMAL LOW (ref 98–111)
Creatinine, Ser: 0.77 mg/dL (ref 0.44–1.00)
GFR, Estimated: >60 mL/min (ref >60)
Glucose, Bld: 145 mg/dL — ABNORMAL HIGH (ref 70–99)
Phosphorus: 3.5 mg/dL (ref 2.5–4.6)
Potassium: 4.5 mmol/L (ref 3.5–5.1)
Sodium: 141 mmol/L (ref 135–145)

## 2023-01-17 LAB — CBC
HCT: 38.1 % (ref 36.0–46.0)
Hemoglobin: 12.2 g/dL (ref 12.0–15.0)
MCH: 30.2 pg (ref 26.0–34.0)
MCHC: 32 g/dL (ref 30.0–36.0)
MCV: 94.3 fL (ref 80.0–100.0)
Platelets: 121 10*3/uL — ABNORMAL LOW (ref 150–400)
RBC: 4.04 MIL/uL (ref 3.87–5.11)
RDW: 13.1 % (ref 11.5–15.5)
WBC: 8.3 10*3/uL (ref 4.0–10.5)
nRBC: 0 % (ref 0.0–0.2)

## 2023-01-17 LAB — MAGNESIUM: Magnesium: 1.9 mg/dL (ref 1.7–2.4)

## 2023-01-17 LAB — HEPARIN LEVEL (UNFRACTIONATED): Heparin Unfractionated: 0.53 IU/mL (ref 0.30–0.70)

## 2023-01-17 LAB — BRAIN NATRIURETIC PEPTIDE: B Natriuretic Peptide: 241.7 pg/mL — ABNORMAL HIGH (ref 0.0–100.0)

## 2023-01-17 LAB — GLUCOSE, CAPILLARY
Glucose-Capillary: 169 mg/dL — ABNORMAL HIGH (ref 70–99)
Glucose-Capillary: 145 mg/dL — ABNORMAL HIGH (ref 70–99)
Glucose-Capillary: 133 mg/dL — ABNORMAL HIGH (ref 70–99)
Glucose-Capillary: 128 mg/dL — ABNORMAL HIGH (ref 70–99)
Glucose-Capillary: 189 mg/dL — ABNORMAL HIGH (ref 70–99)

## 2023-01-17 LAB — APTT: aPTT: 77 seconds — ABNORMAL HIGH (ref 24–36)

## 2023-01-17 SURGERY — LEFT HEART CATH AND CORONARY ANGIOGRAPHY
Anesthesia: LOCAL

## 2023-01-17 MED ORDER — LABETALOL HCL 5 MG/ML IV SOLN: 10.0000 mg | INTRAVENOUS | Status: AC | PRN

## 2023-01-17 MED ORDER — APIXABAN 5 MG PO TABS
5.0000 mg | ORAL_TABLET | Freq: Two times a day (BID) | ORAL | Status: DC
  Administered 2023-01-17 – 2023-01-18 (×2): 5 mg via ORAL
  Filled 2023-01-17 (×2): qty 1

## 2023-01-17 MED ORDER — HEPARIN SODIUM (PORCINE) 1000 UNIT/ML IJ SOLN
INTRAMUSCULAR | Status: DC | PRN
  Administered 2023-01-17: 4000 [IU] via INTRAVENOUS

## 2023-01-17 MED ORDER — FUROSEMIDE 40 MG PO TABS
40.0000 mg | ORAL_TABLET | Freq: Once | ORAL | Status: AC
  Administered 2023-01-18: 40 mg via ORAL
  Filled 2023-01-17: qty 1

## 2023-01-17 MED ORDER — FENTANYL CITRATE (PF) 100 MCG/2ML IJ SOLN
INTRAMUSCULAR | Status: AC
  Filled 2023-01-17: qty 2

## 2023-01-17 MED ORDER — DAPAGLIFLOZIN PROPANEDIOL 10 MG PO TABS
10.0000 mg | ORAL_TABLET | Freq: Every day | ORAL | Status: DC
  Administered 2023-01-17 – 2023-01-18 (×2): 10 mg via ORAL
  Filled 2023-01-17 (×2): qty 1

## 2023-01-17 MED ORDER — VERAPAMIL HCL 2.5 MG/ML IV SOLN
INTRAVENOUS | Status: AC
  Filled 2023-01-17: qty 2

## 2023-01-17 MED ORDER — FENTANYL CITRATE (PF) 100 MCG/2ML IJ SOLN
INTRAMUSCULAR | Status: DC | PRN
  Administered 2023-01-17: 12.5 ug via INTRAVENOUS

## 2023-01-17 MED ORDER — IOHEXOL 350 MG/ML SOLN
INTRAVENOUS | Status: DC | PRN
  Administered 2023-01-17: 65 mL

## 2023-01-17 MED ORDER — LIDOCAINE HCL (PF) 1 % IJ SOLN
INTRAMUSCULAR | Status: DC | PRN
  Administered 2023-01-17: 2 mL

## 2023-01-17 MED ORDER — HYDRALAZINE HCL 20 MG/ML IJ SOLN: 10.0000 mg | INTRAMUSCULAR | Status: AC | PRN

## 2023-01-17 MED ORDER — SODIUM CHLORIDE 0.9% FLUSH: 3.0000 mL | INTRAVENOUS | Status: DC | PRN

## 2023-01-17 MED ORDER — HEPARIN SODIUM (PORCINE) 1000 UNIT/ML IJ SOLN
INTRAMUSCULAR | Status: AC
  Filled 2023-01-17: qty 10

## 2023-01-17 MED ORDER — SODIUM CHLORIDE 0.9 % IV SOLN
250.0000 mL | INTRAVENOUS | Status: DC | PRN
  Administered 2023-01-18: 250 mL via INTRAVENOUS

## 2023-01-17 MED ORDER — SODIUM CHLORIDE 0.9% FLUSH
3.0000 mL | Freq: Two times a day (BID) | INTRAVENOUS | Status: DC
  Administered 2023-01-17 – 2023-01-18 (×3): 3 mL via INTRAVENOUS

## 2023-01-17 MED ORDER — VERAPAMIL HCL 2.5 MG/ML IV SOLN
INTRAVENOUS | Status: DC | PRN
  Administered 2023-01-17: 10 mL via INTRA_ARTERIAL

## 2023-01-17 MED ORDER — LIDOCAINE HCL (PF) 1 % IJ SOLN
INTRAMUSCULAR | Status: AC
  Filled 2023-01-17: qty 30

## 2023-01-17 MED ORDER — HEPARIN (PORCINE) IN NACL 1000-0.9 UT/500ML-% IV SOLN
INTRAVENOUS | Status: DC | PRN
  Administered 2023-01-17 (×2): 500 mL

## 2023-01-17 SURGICAL SUPPLY — 11 items
BAND CMPR LRG ZPHR (HEMOSTASIS) ×1
BAND ZEPHYR COMPRESS 30 LONG (HEMOSTASIS) IMPLANT
CATH INFINITI 5FR ANG PIGTAIL (CATHETERS) IMPLANT
CATH OPTITORQUE TIG 4.0 5F (CATHETERS) IMPLANT
GLIDESHEATH SLEND SS 6F .021 (SHEATH) IMPLANT
GUIDEWIRE INQWIRE 1.5J.035X260 (WIRE) IMPLANT
INQWIRE 1.5J .035X260CM (WIRE) ×1
KIT HEART LEFT (KITS) ×1 IMPLANT
PACK CARDIAC CATHETERIZATION (CUSTOM PROCEDURE TRAY) ×1 IMPLANT
TRANSDUCER W/STOPCOCK (MISCELLANEOUS) ×1 IMPLANT
TUBING CIL FLEX 10 FLL-RA (TUBING) ×1 IMPLANT

## 2023-01-17 NOTE — Progress Notes (Signed)
Reported small hematoma present in cath lab prior to TR band placement.  Post TR band placement 2cm hematoma proximal to TR band, will continue to monitor.

## 2023-01-17 NOTE — Interval H&P Note (Signed)
History and Physical Interval Note:  01/17/2023 9:33 AM  Deanna Petty  has presented today for surgery, with the diagnosis of NSTEMI.  The various methods of treatment have been discussed with the patient and family. After consideration of risks, benefits and other options for treatment, the patient has consented to  Procedure(s): LEFT HEART CATH AND CORONARY ANGIOGRAPHY (N/A) as a surgical intervention.  The patient's history has been reviewed, patient examined, no change in status, stable for surgery.  I have reviewed the patient's chart and labs.  Questions were answered to the patient's satisfaction.    Cath Lab Visit (complete for each Cath Lab visit)  Clinical Evaluation Leading to the Procedure:   ACS: Yes.    Non-ACS:  N/A  [name]

## 2023-01-17 NOTE — Progress Notes (Signed)
PROGRESS NOTE  Deanna Petty [id number] DOB: Oct 21, 1945   PCP: Pcp, No  Patient is from: Home.  Uses cane at baseline  DOA: 01/12/2023 LOS: 5  Chief complaints Chief Complaint  Patient presents with   Tachycardia    AFIB     Brief Narrative / Interim history: 77 year old F with PMH of COPD/chronic hypoxic RF on 3 L, moderate AS, DM-2, tobacco use disorder (lifetime smoker), HTN, HLD and obesity presenting with cough, shortness of breath, nausea, weakness and increased heart rate, and admitted for new onset A-fib with RVR, COPD exacerbation and NSTEMI.  In ED, in RVR to 151.  RR 34.  Desaturated to 81% on home 3 L.  VBG with acute on chronic respiratory acidosis.  K2.8.  Mg 1.1.  BNP 140.  Troponin 20 and trended up to 2700.  EKG in A-fib with RVR to 149.  Repeat EKG sinus rhythm with RBBB and TWI in inferolateral leads.  CTA chest negative for PE but enlarging spiculated 1.8 cm nodule in LUL concerning for bronchogenic carcinoma.  Patient converted to sinus rhythm after IV Cardizem push.  Started on IV heparin and COPD meds.  Cardiology consulted.  TTE ordered.  Pulmonology evaluated patient and recommended close follow-up outpatient for LUL mass.  TTE without significant finding.  Patient was started on IV Lasix for CHF with improvement in his symptoms.  LHC with "mild to moderate CAD with more severe lesion (70% stenosis) in distal LCx.  Medical therapy and risk factor modification recommended.  Cardiology following.   Subjective: Seen and examined earlier this morning before she went for left heart catheterization.  Reports improvement in her breathing.  She denies chest pain.  Denies GI or UTI symptoms.  Objective: Vitals:   01/17/23 0817 01/17/23 0940 01/17/23 1048 01/17/23 1200  BP: 136/61  (!) 170/48 (!) 145/78  Pulse: (!) 52   (!) 52  Resp: 17   17  Temp: 97.9 F (36.6 C)   98.2 F (36.8 C)  TempSrc: Axillary   Oral  SpO2: 99% 95%  94%  Weight:      Height:         Examination:  GENERAL: No apparent distress.  Nontoxic. HEENT: MMM.  Vision and hearing grossly intact.  NECK: Supple.  No apparent JVD.  RESP:  No IWOB.  Fair aeration bilaterally.  Crackles or rhonchi. CVS:  RRR. Heart sounds normal.  ABD/GI/GU: BS+. Abd soft, NTND.  MSK/EXT:   No apparent deformity. Moves extremities. No edema.  SKIN: no apparent skin lesion or wound NEURO: Awake and alert. Oriented appropriately.  No apparent focal neuro deficit. PSYCH: Calm. Normal affect.   Procedures:  4/2-LHC with "mild to moderate CAD with more severe lesion (70% stenosis) in distal LCx.  Microbiology summarized: COVID-19, influenza and RSV PCR nonreactive  Assessment and plan: Principal Problem:   COPD with acute exacerbation Active Problems:   Tobacco use   Type 2 diabetes mellitus with hyperlipidemia   Elevated brain natriuretic peptide (BNP) level   Acute on chronic respiratory failure with hypoxia and hypercapnia   Essential hypertension   Hypomagnesemia   New onset atrial fibrillation   Myocardial infarction due to demand ischemia   Non-STEMI (non-ST elevated myocardial infarction)   Hypokalemia   Acute heart failure with preserved ejection fraction (HFpEF)  New onset A-fib with RVR: Converted to sinus rhythm after IV Cardizem push in ED.  Remains in sinus rhythm.  Could be due to COPD exacerbation and electrolyte derangement.  TTE without significant finding.  CHA2DS2-VASc score > 5 -Cardiology on board-on Zebeta and Eliquis. -Optimize electrolytes  Non-STEMI: Patient without chest pain but the DOE.  Troponin peaked at 6000 and started to trend down.  EKG with TWI in inferolateral leads but not new.  TTE without significant finding.  LHC with "mild to moderate CAD with more severe lesion (70% stenosis) in distal LCx. LDL 32.  A1c 7.4%.   -Per cardiology-on aspirin, Zebeta, Crestor -Follow-up further cardiology recommendation  COPD exacerbation: Likely due to ongoing  cigarette smoking.  Lifetime smoker.  Reports good compliance.  Improved. -Completed steroid and antibiotic course -Continue Brovana, Pulmicort and Yupelri -Continue DuoNebs as needed -Discussed the importance of smoking cessation  Acute on chronic HFpEF: Patient continues to have significant respiratory distress.  CXR suggesting CHF BNP slightly elevated.  TTE and LHC as above.  Started on IV Lasix.  Net -7 L.  Cr improved. -Transitioned to p.o. Lasix starting 4/3 -Started on Farxiga -Strict intake and output, daily weight, renal functions and electrolytes  Acute on chronic respiratory failure with hypoxia and hypercapnia: On 3 L at baseline.  Currently on 2 L at rest. -Minimum oxygen to keep saturation above 88% regardless of home 3 L due to risk of CO2 retention -BiPAP at night -Incentive spirometry/OOB/PT/OT -COPD and CHF management as above  Lung nodule: CT angio showed 1.8 cm enlarging and spiculated LUL nodule concerning for bronchogenic carcinoma.  Followed by Dr. Kimber Relic. -PCCM consulted and recommended close follow-up outpatient on discharge.  Hypokalemia/hypomagnesemia: Resolved -Monitor replenish as appropriate  NIDDM-2 with hyperglycemia and hyperlipidemia: A1c is 1.4%.  On metformin at home. Recent Labs  Lab 01/16/23 2023 01/16/23 2353 01/17/23 0404 01/17/23 0816 01/17/23 1200  GLUCAP 232* 276* 169* 145* 133*  -Continue SSI-moderate -Started on Farxiga as well -Continue statin  Tobacco use disorder: Lifetime smoker.  Over 100-pack-year history -Encouraged smoking cessation -Likes to try nicotine gums.  Reports skin irritation with nicotine patch  Essential hypertension: Normotensive. -Cardiac meds as above.  Physical deconditioning: Uses cane at baseline -PT/OT  Hyperkalemia: Resolved  Goal of care: Patient changed CODE STATUS to DNR/DNI after talking to pulmonology on 3/29.  Now patient likes to reverse CODE STATUS to full code for the remainder of this  hospitalization in light of upcoming cardiac procedure -CODE STATUS changed to full code.  Obesity Body mass index is 33.99 kg/m. Nutrition Problem: Predicted suboptimal nutrient intake Etiology:  (hospitalization) Signs/Symptoms:  (recent COPD exacerbation and lung nodule) Interventions: Education   DVT prophylaxis:   apixaban (ELIQUIS) tablet 5 mg  Code Status: DNR/DNI Family Communication: None at bedside Level of care: Telemetry Cardiac Status is: Inpatient Remains inpatient appropriate because: Respiratory failure, non-STEMI, A-fib and COPD exacerbation   Final disposition: Home Consultants:  Cardiology PCCM  55 minutes with more than 50% spent in reviewing records, counseling patient/family and coordinating care.   Sch Meds:  Scheduled Meds:  amLODipine  10 mg Oral Daily   apixaban  5 mg Oral BID   arformoterol  15 mcg Nebulization BID   aspirin EC  81 mg Oral Daily   bisoprolol  5 mg Oral Daily   budesonide (PULMICORT) nebulizer solution  0.5 mg Nebulization BID   dapagliflozin propanediol  10 mg Oral Daily   [START ON 01/18/2023] furosemide  40 mg Oral Once   guaiFENesin  600 mg Oral BID   insulin aspart  0-15 Units Subcutaneous TID WC   insulin aspart  0-5 Units Subcutaneous QHS  pantoprazole  40 mg Oral Daily   revefenacin  175 mcg Nebulization Daily   rosuvastatin  20 mg Oral Daily   sodium chloride flush  3 mL Intravenous Q12H   sodium chloride flush  3 mL Intravenous Q12H   Continuous Infusions:  sodium chloride     PRN Meds:.sodium chloride, acetaminophen **OR** acetaminophen, fentaNYL (SUBLIMAZE) injection, hydrALAZINE, HYDROcodone-acetaminophen, iohexol, ipratropium-albuterol, labetalol, nicotine polacrilex, ondansetron **OR** ondansetron (ZOFRAN) IV, sodium chloride flush  Antimicrobials: Anti-infectives (From admission, onward)    Start     Dose/Rate Route Frequency Ordered Stop   01/13/23 0658  cefTRIAXone (ROCEPHIN) 1 g in sodium chloride  0.9 % 100 mL IVPB        1 g 200 mL/hr over 30 Minutes Intravenous Every 24 hours 01/13/23 0658 01/17/23 0806        I have personally reviewed the following labs and images: CBC: Recent Labs  Lab 01/12/23 1545 01/12/23 1625 01/12/23 2258 01/12/23 2300 01/13/23 0710 01/14/23 0231 01/15/23 0150 01/16/23 0208 01/17/23 0642  WBC 7.0  --  8.4   < > 10.0 14.7* 13.3* 12.8* 8.3  NEUTROABS 4.9  --  7.7  --   --   --   --   --   --   HGB 10.2*   < > 13.5   < > 13.1 12.4 12.7 12.8 12.2  HCT 33.3*   < > 44.0   < > 41.7 39.7 41.0 40.6 38.1  MCV 99.4  --  97.8   < > 96.1 95.7 97.4 95.8 94.3  PLT 115*  --  173   < > 168 165 157 139* 121*   < > = values in this interval not displayed.   BMP &GFR Recent Labs  Lab 01/13/23 0710 01/14/23 0231 01/15/23 0150 01/16/23 0208 01/17/23 0642  NA 138 136 139 141 141  K 4.8 5.0 5.4* 5.1 4.5  CL 98 97* 95* 94* 96*  CO2 32 31 33* 39* 36*  GLUCOSE 175* 191* 184* 154* 145*  BUN 14 17 16 21 17   CREATININE 0.72 0.78 0.72 0.95 0.77  CALCIUM 8.8* 9.0 9.0 9.1 8.7*  MG 2.0 1.9 2.1 1.9 1.9  PHOS 3.5 3.4 4.4 4.3 3.5   Estimated Creatinine Clearance: 61.2 mL/min (by C-G formula based on SCr of 0.77 mg/dL). Liver & Pancreas: Recent Labs  Lab 01/12/23 1545 01/13/23 0710 01/14/23 0231 01/15/23 0150 01/16/23 0208 01/17/23 0642  AST 18 47*  --   --   --   --   ALT 18 26  --   --   --   --   ALKPHOS 46 63  --   --   --   --   BILITOT 0.4 0.5  --   --   --   --   PROT 3.9* 5.9*  --   --   --   --   ALBUMIN 1.9* 2.8* 3.1* 3.2* 3.1* 2.8*   No results for input(s): "LIPASE", "AMYLASE" in the last 168 hours. No results for input(s): "AMMONIA" in the last 168 hours. Diabetic: No results for input(s): "HGBA1C" in the last 72 hours.  Recent Labs  Lab 01/16/23 2023 01/16/23 2353 01/17/23 0404 01/17/23 0816 01/17/23 1200  GLUCAP 232* 276* 169* 145* 133*   Cardiac Enzymes: Recent Labs  Lab 01/12/23 2258  CKTOTAL 256*   No results for  input(s): "PROBNP" in the last 8760 hours. Coagulation Profile: No results for input(s): "INR", "PROTIME" in the last 168 hours. Thyroid  Function Tests: No results for input(s): "TSH", "T4TOTAL", "FREET4", "T3FREE", "THYROIDAB" in the last 72 hours.  Lipid Profile: No results for input(s): "CHOL", "HDL", "LDLCALC", "TRIG", "CHOLHDL", "LDLDIRECT" in the last 72 hours.  Anemia Panel: No results for input(s): "VITAMINB12", "FOLATE", "FERRITIN", "TIBC", "IRON", "RETICCTPCT" in the last 72 hours.  Urine analysis:    Component Value Date/Time   COLORURINE YELLOW 01/12/2023 2255   APPEARANCEUR HAZY (A) 01/12/2023 2255   LABSPEC >1.046 (H) 01/12/2023 2255   PHURINE 6.0 01/12/2023 2255   GLUCOSEU 50 (A) 01/12/2023 2255   HGBUR SMALL (A) 01/12/2023 2255   BILIRUBINUR NEGATIVE 01/12/2023 2255   KETONESUR NEGATIVE 01/12/2023 2255   PROTEINUR 100 (A) 01/12/2023 2255   NITRITE NEGATIVE 01/12/2023 2255   LEUKOCYTESUR NEGATIVE 01/12/2023 2255   Sepsis Labs: Invalid input(s): "PROCALCITONIN", "LACTICIDVEN"  Microbiology: Recent Results (from the past 240 hour(s))  Resp panel by RT-PCR (RSV, Flu A&B, Covid) Anterior Nasal Swab     Status: None   Collection Time: 01/12/23  3:40 PM   Specimen: Anterior Nasal Swab  Result Value Ref Range Status   SARS Coronavirus 2 by RT PCR NEGATIVE NEGATIVE Final   Influenza A by PCR NEGATIVE NEGATIVE Final   Influenza B by PCR NEGATIVE NEGATIVE Final    Comment: (NOTE) The Xpert Xpress SARS-CoV-2/FLU/RSV plus assay is intended as an aid in the diagnosis of influenza from Nasopharyngeal swab specimens and should not be used as a sole basis for treatment. Nasal washings and aspirates are unacceptable for Xpert Xpress SARS-CoV-2/FLU/RSV testing.  Fact Sheet for Patients: EntrepreneurPulse.com.au  Fact Sheet for Healthcare Providers: IncredibleEmployment.be  This test is not yet approved or cleared by the [place]  FDA and has been authorized for detection and/or diagnosis of SARS-CoV-2 by FDA under an Emergency Use Authorization (EUA). This EUA will remain in effect (meaning this test can be used) for the duration of the COVID-19 declaration under Section 564(b)(1) of the Act, 21 U.S.C. section 360bbb-3(b)(1), unless the authorization is terminated or revoked.     Resp Syncytial Virus by PCR NEGATIVE NEGATIVE Final    Comment: (NOTE) Fact Sheet for Patients: EntrepreneurPulse.com.au  Fact Sheet for Healthcare Providers: IncredibleEmployment.be  This test is not yet approved or cleared by the [place] FDA and has been authorized for detection and/or diagnosis of SARS-CoV-2 by FDA under an Emergency Use Authorization (EUA). This EUA will remain in effect (meaning this test can be used) for the duration of the COVID-19 declaration under Section 564(b)(1) of the Act, 21 U.S.C. section 360bbb-3(b)(1), unless the authorization is terminated or revoked.  Performed at [facility], [place] [address] [place], [place] [address]     Radiology Studies: CARDIAC CATHETERIZATION  Result Date: 01/17/2023 Conclusions: Mild to moderate coronary artery disease.  Most severe lesion is a 70% stenosis involving the distal LCx, which is too small for intervention.  Elevated troponin is most likely due to supply-demand mismatch (type 2 MI) in the setting of COPD exacerbation, acute HFpEF, and atrial fibrillation with rapid ventricular response. Mildly elevated left ventricular filling pressure (LVEDP 20 mmHg). Recommendations: Medical therapy and risk factor modification to prevent progression of coronary artery disease. Consider gentle diuresis. Nelva Bush, MD [facility]     [address] [place]  If 7PM-7AM, please contact night-coverage [email or web address] 01/17/2023, 2:52 PM

## 2023-01-17 NOTE — Progress Notes (Signed)
Pt off Bipap at this time, on 2LPM [place], tolerating well.

## 2023-01-17 NOTE — Progress Notes (Signed)
Pt resting on 2l [place]. NO distress noted at this time. BIPAP on standby.

## 2023-01-17 NOTE — Progress Notes (Addendum)
Rounding Note    Patient Name: FARZANA TANNENBAUM Date of Encounter: 01/17/2023  [place] Cardiologist: None   Subjective   No complaints this morning. Planned for cardiac cath today.   Inpatient Medications    Scheduled Meds:  amLODipine  10 mg Oral Daily   arformoterol  15 mcg Nebulization BID   aspirin EC  81 mg Oral Daily   bisoprolol  5 mg Oral Daily   budesonide (PULMICORT) nebulizer solution  0.5 mg Nebulization BID   guaiFENesin  600 mg Oral BID   insulin aspart  0-15 Units Subcutaneous TID WC   insulin aspart  0-5 Units Subcutaneous QHS   pantoprazole  40 mg Oral Daily   revefenacin  175 mcg Nebulization Daily   rosuvastatin  20 mg Oral Daily   sodium chloride flush  3 mL Intravenous Q12H   Continuous Infusions:  sodium chloride     sodium chloride 1 mL/kg/hr (01/17/23 0556)   heparin 1,050 Units/hr (01/17/23 0556)   PRN Meds: sodium chloride, acetaminophen **OR** acetaminophen, fentaNYL (SUBLIMAZE) injection, HYDROcodone-acetaminophen, ipratropium-albuterol, nicotine polacrilex, ondansetron **OR** ondansetron (ZOFRAN) IV, sodium chloride flush   Vital Signs    Vitals:   01/16/23 1927 01/16/23 2355 01/17/23 0352 01/17/23 0433  BP: (!) 137/40 (!) 132/39 (!) 150/34   Pulse: (!) 58 (!) 59    Resp: 18 18 18    Temp: 98.9 F (37.2 C) 97.9 F (36.6 C) 97.8 F (36.6 C)   TempSrc: Oral Oral Oral   SpO2: 92% 95% 95%   Weight:    84.3 kg  Height:        Intake/Output Summary (Last 24 hours) at 01/17/2023 0802 Last data filed at 01/17/2023 0556 Gross per 24 hour  Intake 1197.83 ml  Output 3400 ml  Net -2202.17 ml      01/17/2023    4:33 AM 01/16/2023    4:22 AM 01/12/2023    9:00 PM  Last 3 Weights  Weight (lbs) 185 lb 13.6 oz 187 lb 2.7 oz 186 lb 1.1 oz  Weight (kg) 84.3 kg 84.9 kg 84.4 kg      Telemetry    Sinus Rhythm - Personally Reviewed  ECG    No new tracing this morning  Physical Exam   GEN: No acute distress, on [place] @3L    Neck:  No JVD Cardiac: RRR, no murmurs, rubs, or gallops.  Respiratory: Mild expiratory wheezing GI: Soft, nontender, non-distended  MS: No edema; No deformity. Neuro:  Nonfocal  Psych: Normal affect   Labs    High Sensitivity Troponin:   Recent Labs  Lab 01/12/23 2258 01/13/23 0310 01/13/23 0710 01/13/23 1010 01/13/23 1234  TROPONINIHS 5,742* 6,255* 4,959* 4,191* 2,071*     Chemistry Recent Labs  Lab 01/12/23 1545 01/12/23 1625 01/13/23 0710 01/14/23 0231 01/15/23 0150 01/16/23 0208  NA 144   < > 138 136 139 141  K 2.8*   < > 4.8 5.0 5.4* 5.1  CL 111   < > 98 97* 95* 94*  CO2 25   < > 32 31 33* 39*  GLUCOSE 149*   < > 175* 191* 184* 154*  BUN 11   < > 14 17 16 21   CREATININE 0.61   < > 0.72 0.78 0.72 0.95  CALCIUM 6.2*   < > 8.8* 9.0 9.0 9.1  MG 1.1*   < > 2.0 1.9 2.1 1.9  PROT 3.9*  --  5.9*  --   --   --  ALBUMIN 1.9*  --  2.8* 3.1* 3.2* 3.1*  AST 18  --  47*  --   --   --   ALT 18  --  26  --   --   --   ALKPHOS 46  --  63  --   --   --   BILITOT 0.4  --  0.5  --   --   --   GFRNONAA >60   < > >60 >60 >60 >60  ANIONGAP 8   < > 8 8 11 8    < > = values in this interval not displayed.    Lipids  Recent Labs  Lab 01/13/23 0150  CHOL 92  TRIG 46  HDL 51  LDLCALC 32  CHOLHDL 1.8    Hematology Recent Labs  Lab 01/15/23 0150 01/16/23 0208 01/17/23 0642  WBC 13.3* 12.8* 8.3  RBC 4.21 4.24 4.04  HGB 12.7 12.8 12.2  HCT 41.0 40.6 38.1  MCV 97.4 95.8 94.3  MCH 30.2 30.2 30.2  MCHC 31.0 31.5 32.0  RDW 13.2 13.2 13.1  PLT 157 139* 121*   Thyroid  Recent Labs  Lab 01/12/23 2258  TSH 0.573    BNP Recent Labs  Lab 01/12/23 1545  BNP 140.7*    DDimer No results for input(s): "DDIMER" in the last 168 hours.   Radiology    No results found.  Cardiac Studies   Echo: 01/13/2023   IMPRESSIONS    1. Left ventricular ejection fraction, by estimation, is 60 to 65%. The  left ventricle has normal function. The left ventricle has no regional  wall  motion abnormalities. There is mild concentric left ventricular  hypertrophy. Left ventricular diastolic  parameters are consistent with Grade I diastolic dysfunction (impaired  relaxation).   2. Right ventricular systolic function is normal. The right ventricular  size is normal. Tricuspid regurgitation signal is inadequate for assessing  PA pressure.   3. No evidence of mitral valve regurgitation.   4. There is moderate calcification of the aortic valve. Aortic valve  regurgitation is not visualized. Mild aortic valve stenosis.   5. The inferior vena cava is dilated in size with >50% respiratory  variability, suggesting right atrial pressure of 8 mmHg.   FINDINGS   Left Ventricle: Left ventricular ejection fraction, by estimation, is 60  to 65%. The left ventricle has normal function. The left ventricle has no  regional wall motion abnormalities. Definity contrast agent was given IV  to delineate the left ventricular   endocardial borders. The left ventricular internal cavity size was normal  in size. There is mild concentric left ventricular hypertrophy. Left  ventricular diastolic parameters are consistent with Grade I diastolic  dysfunction (impaired relaxation).   Right Ventricle: The right ventricular size is normal. Right ventricular  systolic function is normal. Tricuspid regurgitation signal is inadequate  for assessing PA pressure.   Left Atrium: Left atrial size was normal in size.   Right Atrium: Right atrial size was normal in size.   Pericardium: There is no evidence of pericardial effusion.   Mitral Valve: No evidence of mitral valve regurgitation.   Tricuspid Valve: Tricuspid valve regurgitation is not demonstrated.   Aortic Valve: There is moderate calcification of the aortic valve. Aortic  valve regurgitation is not visualized. Mild aortic stenosis is present.  Aortic valve mean gradient measures 12.3 mmHg. Aortic valve peak gradient  measures 19.0 mmHg.  Aortic valve  area, by VTI measures 1.26 cm.   Pulmonic  Valve: Pulmonic valve regurgitation is not visualized.   Aorta: The aortic root and ascending aorta are structurally normal, with  no evidence of dilitation.   Venous: The inferior vena cava is dilated in size with greater than 50%  respiratory variability, suggesting right atrial pressure of 8 mmHg.   IAS/Shunts: No atrial level shunt detected by color flow Doppler.     Cardiac Cath 01/17/2023 - reviewed after rounds Conclusions: Mild to moderate coronary artery disease.  Most severe lesion is a 70% stenosis involving the distal LCx, which is too small for intervention.  Elevated troponin is most likely due to supply-demand mismatch (type 2 MI) in the setting of COPD exacerbation, acute HFpEF, and atrial fibrillation with rapid ventricular response. Mildly elevated left ventricular filling pressure (LVEDP 20 mmHg). Mild AoV stenosis - peak-peak gradient 11 mmHg   Recommendations: Medical therapy and risk factor modification to prevent progression of coronary artery disease. Consider gentle diuresis.     Patient Profile     77 y.o. female known to have HTN, DM 2, tobacco abuse, 1.8 cm lung nodule, COPD is currently admitted to hospitalist team for the management of acute on chronic hypoxic respiratory failure secondary to COPD exacerbation versus HFpEF exacerbation and new onset of A-fib with RVR. Cardiology was consulted for elevated troponins.   Cardiac Cath today:  Shared Decision Making/Informed Consent The risks [stroke (1 in 1000), death (1 in 1000), kidney failure [usually temporary] (1 in 500), bleeding (1 in 200), allergic reaction [possibly serious] (1 in 200)], benefits (diagnostic support and management of coronary artery disease) and alternatives of a cardiac catheterization were discussed in detail with Ms. Mcshan and she is willing to proceed.  Assessment & Plan    NSTEMI -> not likely ACS MI, more consistent with  demand ischemia -- High-sensitivity troponin peaked at 6255 in the setting of atrial fibrillation with RVR acute hypoxic respiratory failure.  EKG with diffuse T wave inversions (noted on prior tracings).  -- remains on IV heparin, aspirin, statin, bisoprolol -- Plan for cardiac catheterization once improved from a respiratory standpoint.  Per previous notes, plan for cath tomorrow.  -- if LVEDP elevated, then consider RHC as well  Patient seen and evaluated following her heart catheterization today which revealed mild to moderate diffuse disease with maybe a 70% small distal LCx AV groove branch lesion not likely culprit lesion for elevated troponin levels at this setting.  I suspect that the troponin elevation was related to A-fib RVR, hypoxic respite failure and existing small vessel disease.  Not likely true ACS.  EDP mildly elevated at 20 mmHg, may benefit from low-dose oral diuretic if she remains dyspneic.  I have ordered a single dose of oral Lasix for tomorrow morning, we will see how she responds to the 40 mg dose and determine home dose based on this output.  Afib RVR -- Initially presented in atrial fibrillation with RVR which resolved after receiving 10 mg of IV diltiazem.  Remains in sinus rhythm with rates in the 70s. -- Continue IV heparin, plan for transition to Eliquis post cath => can start either tonight post cath versus tomorrow morning.   Hypertension -- slightly improved  -- Continue bisoprolol, Norvasc    Acute hypoxic respiratory failure COPD Chronic O2 dependent -- Reports wearing 3 L nasal cannula at all times at home, increases to 5 L with exertion. -- Being treated with nebs, steroids as well as antibiotics per primary.  Improving => Could consider low-dose diuretic tomorrow (  would want to avoid today based on contrast exposure)   Hyperlipidemia -- Increased Crestor to 20 mg daily   HFpEF -- Echo showed LVEF of 60 to 65%, no regional wall motion abnormality,  grade 1 diastolic dysfunction, normal RV size and function, mild AS -- has been diuresed with IV lasix, net - 7.5L -- consider addition of SGLT2i prior to DC   Left upper lobe lung nodule -- Seen by PCCM, plan for outpatient follow-up with Dr. Kimber Relic as well as PET scan with possible navigational bronchoscopy. Case discussed with PCCM per Dr. Ellyn Hack. Ok to proceed with cardiac cath and stent if needed. Work up will be done as an outpatient.  -- she will be on [place], and potentially antiplatelet. Will need discussion regarding meds if biopsy planned => With no need for PCI or true culprit lesion seen on cardiac catheterization, no need for antiplatelet agent.  Could consider aspirin but if she is can be on DOAC, would probably just do DOAC monotherapy.     For questions or updates, please contact [place] Please consult [email or web address] for contact info under        Signed, Reino Bellis, NP  01/17/2023, 8:02 AM     ATTENDING ATTESTATION  I have seen, examined and evaluated the patient this AM on rounds along with Reino Bellis, NP-C & post Cath .  After reviewing all the available data and chart, we discussed the patients laboratory, study & physical findings as well as symptoms in detail.  I agree with her findings, examination as well as impression recommendations as per our discussion.    Attending adjustments noted in italics.    Cath films reviewed with Dr. Saunders Revel.  Mild to moderate diffuse disease with the most significant lesion being a small branch of the AV groove circumflex having maybe 70% stenosis.  No culprit lesion to explain true non-STEMI.  I suspect the elevated troponin was demand ischemia related to A-fib RVR and hypoxic respiratory failure. EDP still somewhat elevated have written for 1 dose of Lasix orally in the morning.  Need to determine what dose she would need to be on going home.  No need for antiplatelet agent based on lack of culprit lesion for  obstructive CAD.  Therefore okay to continue with DOAC -> would restart tonight.     Leonie Man, MD, MS Glenetta Hew, M.D., M.S. Interventional Cardiologist  [place]  Pager # [phone] Phone # [phone] [address] [place], [place] [address]

## 2023-01-17 NOTE — TOC Benefit Eligibility Note (Signed)
Patient Teacher, English as a foreign language completed.    The patient is currently admitted and upon discharge could be taking Farxiga 10 mg.  The current 30 day co-pay is $0.00.   The patient is currently admitted and upon discharge could be taking Jardiance 10 mg.  The current 30 day co-pay is $0.00.   The patient is insured through [employer] Part D   This test claim was processed through [facility] amounts may vary at other pharmacies due to pharmacy/plan contracts, or as the patient moves through the different stages of their insurance plan.  Lyndel Safe, [place] Patient Advocate Specialist [place] Patient Advocate Team Direct Number: [phone]  Fax: [phone]

## 2023-01-17 NOTE — Progress Notes (Addendum)
ANTICOAGULATION CONSULT NOTE  Pharmacy Consult for heparin  Indication: chest pain/ACS, new onset afib with RVR   No Known Allergies  Patient Measurements: Height: 5\' 2"  (157.5 cm) Weight: 84.3 kg (185 lb 13.6 oz) IBW/kg (Calculated) : 50.1 Heparin Dosing Weight: 69.2kg   Vital Signs: Temp: 97.9 F (36.6 C) (04/02 0817) Temp Source: Axillary (04/02 0817) BP: 136/61 (04/02 0817) Pulse Rate: 52 (04/02 0817)  Labs: Recent Labs    01/15/23 0150 01/15/23 0150 01/15/23 2119 01/16/23 0208 01/16/23 0759 01/17/23 0642  HGB 12.7  --   --  12.8  --  12.2  HCT 41.0  --   --  40.6  --  38.1  PLT 157  --   --  139*  --  121*  APTT  --    < > 46* 58* 59* 77*  HEPARINUNFRC  --   --  0.92* 0.76*  --  0.53  CREATININE 0.72  --   --  0.95  --  0.77   < > = values in this interval not displayed.    Estimated Creatinine Clearance: 61.2 mL/min (by C-G formula based on SCr of 0.77 mg/dL).   Medical History: Past Medical History:  Diagnosis Date   Bronchitis    Cigarette smoker    COPD (chronic obstructive pulmonary disease)    Pneumonia    Shortness of breath    with exertion; lasted used inhaler 06/02/13    Assessment: Patient presented with weakness and fatigue. Trop elevation up to 2,667, CBC stable. Noted thrombocytopenia, appears chronic. Not on anticoagulation prior to admission.   Patient was transitioned to Eliquis 3/30, last dose 3/30 at ~2100. Pharmacy consulted to resume IV heparin. aPTT is within goal at 77sec on heparin 1050 units/hr, heparin level 0.53. Now appropriate to follow dosing with heparin level vs aPTT. Pt to undergo LHC on 4/2.   Goal of Therapy:  Heparin level 0.3-0.7 units/ml aPTT 66-102 seconds Monitor platelets by anticoagulation protocol: Yes   Plan:  Continue heparin gtt to 1050 units/hr Daily heparin level, CBC, s/s bleeding F/u long term AC plan after cath this week   Park Liter, PharmD Candidate 01/17/2023 8:38 AM

## 2023-01-18 ENCOUNTER — Other Ambulatory Visit (HOSPITAL_COMMUNITY): Payer: Self-pay

## 2023-01-18 ENCOUNTER — Telehealth: Payer: Self-pay | Admitting: Pulmonary Disease

## 2023-01-18 DIAGNOSIS — D6869 Other thrombophilia: Secondary | ICD-10-CM | POA: Diagnosis present

## 2023-01-18 DIAGNOSIS — J441 Chronic obstructive pulmonary disease with (acute) exacerbation: Secondary | ICD-10-CM | POA: Diagnosis not present

## 2023-01-18 DIAGNOSIS — I10 Essential (primary) hypertension: Secondary | ICD-10-CM | POA: Diagnosis not present

## 2023-01-18 DIAGNOSIS — I5033 Acute on chronic diastolic (congestive) heart failure: Secondary | ICD-10-CM | POA: Diagnosis not present

## 2023-01-18 DIAGNOSIS — I4891 Unspecified atrial fibrillation: Secondary | ICD-10-CM | POA: Diagnosis not present

## 2023-01-18 HISTORY — DX: Other thrombophilia: D68.69

## 2023-01-18 LAB — RENAL FUNCTION PANEL
Albumin: 3 g/dL — ABNORMAL LOW (ref 3.5–5.0)
Anion gap: 13 (ref 5–15)
BUN: 15 mg/dL (ref 8–23)
CO2: 33 mmol/L — ABNORMAL HIGH (ref 22–32)
Calcium: 9.2 mg/dL (ref 8.9–10.3)
Chloride: 92 mmol/L — ABNORMAL LOW (ref 98–111)
Creatinine, Ser: 0.81 mg/dL (ref 0.44–1.00)
GFR, Estimated: >60 mL/min (ref >60)
Glucose, Bld: 107 mg/dL — ABNORMAL HIGH (ref 70–99)
Phosphorus: 4.1 mg/dL (ref 2.5–4.6)
Potassium: 4.1 mmol/L (ref 3.5–5.1)
Sodium: 138 mmol/L (ref 135–145)

## 2023-01-18 LAB — CBC
HCT: 41.4 % (ref 36.0–46.0)
Hemoglobin: 13.4 g/dL (ref 12.0–15.0)
MCH: 30.1 pg (ref 26.0–34.0)
MCHC: 32.4 g/dL (ref 30.0–36.0)
MCV: 93 fL (ref 80.0–100.0)
Platelets: 135 10*3/uL — ABNORMAL LOW (ref 150–400)
RBC: 4.45 MIL/uL (ref 3.87–5.11)
RDW: 13.1 % (ref 11.5–15.5)
WBC: 9.8 10*3/uL (ref 4.0–10.5)
nRBC: 0 % (ref 0.0–0.2)

## 2023-01-18 LAB — GLUCOSE, CAPILLARY
Glucose-Capillary: 111 mg/dL — ABNORMAL HIGH (ref 70–99)
Glucose-Capillary: 124 mg/dL — ABNORMAL HIGH (ref 70–99)
Glucose-Capillary: 141 mg/dL — ABNORMAL HIGH (ref 70–99)
Glucose-Capillary: 206 mg/dL — ABNORMAL HIGH (ref 70–99)

## 2023-01-18 LAB — MAGNESIUM: Magnesium: 1.8 mg/dL (ref 1.7–2.4)

## 2023-01-18 LAB — VITAMIN C: Vitamin C: 1.2 mg/dL (ref 0.4–2.0)

## 2023-01-18 MED ORDER — NICOTINE POLACRILEX 2 MG MT GUM: 2.0000 mg | CHEWING_GUM | OROMUCOSAL | 0 refills | Status: DC | PRN

## 2023-01-18 MED ORDER — LOSARTAN POTASSIUM 25 MG PO TABS
25.0000 mg | ORAL_TABLET | Freq: Every day | ORAL | Status: DC
  Administered 2023-01-18: 25 mg via ORAL
  Filled 2023-01-18: qty 1

## 2023-01-18 MED ORDER — DAPAGLIFLOZIN PROPANEDIOL 10 MG PO TABS: 10.0000 mg | ORAL_TABLET | Freq: Every day | ORAL | 2 refills | Status: DC

## 2023-01-18 MED ORDER — ROSUVASTATIN CALCIUM 20 MG PO TABS
20.0000 mg | ORAL_TABLET | Freq: Every day | ORAL | 2 refills | Status: DC
  Filled 2023-01-18: qty 30, 30d supply, fill #0

## 2023-01-18 MED ORDER — DAPAGLIFLOZIN PROPANEDIOL 10 MG PO TABS
10.0000 mg | ORAL_TABLET | Freq: Every day | ORAL | 2 refills | Status: AC
  Filled 2023-01-18 (×2): qty 30, 30d supply, fill #0

## 2023-01-18 MED ORDER — ROSUVASTATIN CALCIUM 20 MG PO TABS: 20.0000 mg | ORAL_TABLET | Freq: Every day | ORAL | 2 refills | Status: DC

## 2023-01-18 MED ORDER — LOSARTAN POTASSIUM 25 MG PO TABS: 25.0000 mg | ORAL_TABLET | Freq: Every day | ORAL | 2 refills | Status: DC

## 2023-01-18 MED ORDER — FUROSEMIDE 20 MG PO TABS: 20.0000 mg | ORAL_TABLET | Freq: Every day | ORAL | 1 refills | Status: DC | PRN

## 2023-01-18 MED ORDER — BISOPROLOL FUMARATE 5 MG PO TABS: 5.0000 mg | ORAL_TABLET | Freq: Every day | ORAL | 2 refills | Status: DC

## 2023-01-18 MED ORDER — APIXABAN 5 MG PO TABS
5.0000 mg | ORAL_TABLET | Freq: Two times a day (BID) | ORAL | 2 refills | Status: AC
  Filled 2023-01-18 (×2): qty 60, 30d supply, fill #0

## 2023-01-18 MED ORDER — LOSARTAN POTASSIUM 25 MG PO TABS
25.0000 mg | ORAL_TABLET | Freq: Every day | ORAL | 2 refills | Status: DC
  Filled 2023-01-18: qty 30, 30d supply, fill #0

## 2023-01-18 MED ORDER — NICOTINE POLACRILEX 2 MG MT GUM
2.0000 mg | CHEWING_GUM | OROMUCOSAL | 0 refills | Status: DC | PRN
  Filled 2023-01-18: qty 100, fill #0

## 2023-01-18 MED ORDER — MAGNESIUM SULFATE 2 GM/50ML IV SOLN
2.0000 g | Freq: Once | INTRAVENOUS | Status: AC
  Administered 2023-01-18: 2 g via INTRAVENOUS
  Filled 2023-01-18: qty 50

## 2023-01-18 MED ORDER — ASPIRIN 81 MG PO TBEC
81.0000 mg | DELAYED_RELEASE_TABLET | Freq: Every day | ORAL | 2 refills | Status: DC
  Filled 2023-01-18: qty 30, 30d supply, fill #0

## 2023-01-18 MED ORDER — BISOPROLOL FUMARATE 5 MG PO TABS
5.0000 mg | ORAL_TABLET | Freq: Every day | ORAL | 2 refills | Status: AC
  Filled 2023-01-18 (×2): qty 30, 30d supply, fill #0

## 2023-01-18 MED ORDER — FUROSEMIDE 20 MG PO TABS
20.0000 mg | ORAL_TABLET | Freq: Every day | ORAL | 1 refills | Status: DC | PRN
  Filled 2023-01-18: qty 30, 30d supply, fill #0

## 2023-01-18 MED ORDER — ASPIRIN 81 MG PO TBEC: 81.0000 mg | DELAYED_RELEASE_TABLET | Freq: Every day | ORAL | 2 refills | Status: DC

## 2023-01-18 MED ORDER — APIXABAN 5 MG PO TABS: 5.0000 mg | ORAL_TABLET | Freq: Two times a day (BID) | ORAL | 2 refills | Status: DC

## 2023-01-18 NOTE — Care Management Important Message (Signed)
Important Message  Patient Details  Name: Deanna Petty MRN: [id number] Date of Birth: 30-Aug-1946   Medicare Important Message Given:  Yes     [name] 01/18/2023, 10:13 AM

## 2023-01-18 NOTE — Telephone Encounter (Signed)
At this time patient is still currently available. Patient does have HFU scheduled 02/08/23 with Dr. Vaughan Browner. Will schedule PET after discharge from hospital.

## 2023-01-18 NOTE — Telephone Encounter (Signed)
PT is sched for a Hospital FU w/Dr.   AVS notes of last appt state she should have a PFT.   Should we order call back PT to sched PFT or put off because of recent hospitalization? Please advise. TY.

## 2023-01-18 NOTE — Progress Notes (Addendum)
Rounding Note    Patient Name: CHIAKI SWAINE Date of Encounter: 01/18/2023  [place] Cardiologist: None   Subjective   Feeling well this morning. Breathing continues to improve.   Inpatient Medications    Scheduled Meds:  amLODipine  10 mg Oral Daily   apixaban  5 mg Oral BID   arformoterol  15 mcg Nebulization BID   aspirin EC  81 mg Oral Daily   bisoprolol  5 mg Oral Daily   budesonide (PULMICORT) nebulizer solution  0.5 mg Nebulization BID   dapagliflozin propanediol  10 mg Oral Daily   furosemide  40 mg Oral Once   guaiFENesin  600 mg Oral BID   insulin aspart  0-15 Units Subcutaneous TID WC   insulin aspart  0-5 Units Subcutaneous QHS   pantoprazole  40 mg Oral Daily   revefenacin  175 mcg Nebulization Daily   rosuvastatin  20 mg Oral Daily   sodium chloride flush  3 mL Intravenous Q12H   sodium chloride flush  3 mL Intravenous Q12H   Continuous Infusions:  sodium chloride     PRN Meds: sodium chloride, acetaminophen **OR** acetaminophen, fentaNYL (SUBLIMAZE) injection, HYDROcodone-acetaminophen, iohexol, ipratropium-albuterol, nicotine polacrilex, ondansetron **OR** ondansetron (ZOFRAN) IV, sodium chloride flush   Vital Signs    Vitals:   01/18/23 0427 01/18/23 0751 01/18/23 0753 01/18/23 0756  BP:      Pulse:  74    Resp: 18 16    Temp: 98.3 F (36.8 C)     TempSrc: Oral     SpO2:  94% 94% 94%  Weight: 82.6 kg     Height:        Intake/Output Summary (Last 24 hours) at 01/18/2023 0803 Last data filed at 01/18/2023 0429 Gross per 24 hour  Intake 480 ml  Output 3850 ml  Net -3370 ml      01/18/2023    4:27 AM 01/17/2023    4:33 AM 01/16/2023    4:22 AM  Last 3 Weights  Weight (lbs) 182 lb 1.6 oz 185 lb 13.6 oz 187 lb 2.7 oz  Weight (kg) 82.6 kg 84.3 kg 84.9 kg      Telemetry    Sinus Rhythm - Personally Reviewed  ECG    No new tracing  Physical Exam   GEN: No acute distress.   Neck: No JVD Cardiac: RRR, no murmurs, rubs, or  gallops.  Respiratory: Clear to auscultation bilaterally. GI: Soft, nontender, non-distended  MS: No edema; No deformity. Right radial cath site stable.  Neuro:  Nonfocal  Psych: Normal affect   Labs    High Sensitivity Troponin:   Recent Labs  Lab 01/12/23 2258 01/13/23 0310 01/13/23 0710 01/13/23 1010 01/13/23 1234  TROPONINIHS 5,742* 6,255* 4,959* 4,191* 2,071*     Chemistry Recent Labs  Lab 01/12/23 1545 01/12/23 1625 01/13/23 0710 01/14/23 0231 01/16/23 0208 01/17/23 0642 01/18/23 0215  NA 144   < > 138   < > 141 141 138  K 2.8*   < > 4.8   < > 5.1 4.5 4.1  CL 111   < > 98   < > 94* 96* 92*  CO2 25   < > 32   < > 39* 36* 33*  GLUCOSE 149*   < > 175*   < > 154* 145* 107*  BUN 11   < > 14   < > 21 17 15   CREATININE 0.61   < > 0.72   < >  0.95 0.77 0.81  CALCIUM 6.2*   < > 8.8*   < > 9.1 8.7* 9.2  MG 1.1*   < > 2.0   < > 1.9 1.9 1.8  PROT 3.9*  --  5.9*  --   --   --   --   ALBUMIN 1.9*  --  2.8*   < > 3.1* 2.8* 3.0*  AST 18  --  47*  --   --   --   --   ALT 18  --  26  --   --   --   --   ALKPHOS 46  --  63  --   --   --   --   BILITOT 0.4  --  0.5  --   --   --   --   GFRNONAA >60   < > >60   < > >60 >60 >60  ANIONGAP 8   < > 8   < > 8 9 13    < > = values in this interval not displayed.    Lipids  Recent Labs  Lab 01/13/23 0150  CHOL 92  TRIG 46  HDL 51  LDLCALC 32  CHOLHDL 1.8    Hematology Recent Labs  Lab 01/16/23 0208 01/17/23 0642 01/18/23 0215  WBC 12.8* 8.3 9.8  RBC 4.24 4.04 4.45  HGB 12.8 12.2 13.4  HCT 40.6 38.1 41.4  MCV 95.8 94.3 93.0  MCH 30.2 30.2 30.1  MCHC 31.5 32.0 32.4  RDW 13.2 13.1 13.1  PLT 139* 121* 135*   Thyroid  Recent Labs  Lab 01/12/23 2258  TSH 0.573    BNP Recent Labs  Lab 01/12/23 1545 01/17/23 0641  BNP 140.7* 241.7*    DDimer No results for input(s): "DDIMER" in the last 168 hours.   Radiology    CARDIAC CATHETERIZATION  Result Date: 01/17/2023 Conclusions: Mild to moderate coronary artery  disease.  Most severe lesion is a 70% stenosis involving the distal LCx, which is too small for intervention.  Elevated troponin is most likely due to supply-demand mismatch (type 2 MI) in the setting of COPD exacerbation, acute HFpEF, and atrial fibrillation with rapid ventricular response. Mildly elevated left ventricular filling pressure (LVEDP 20 mmHg). Recommendations: Medical therapy and risk factor modification to prevent progression of coronary artery disease. Consider gentle diuresis. Nelva Bush, MD [facility]   Cardiac Studies   Echo: 01/13/2023   IMPRESSIONS    1. Left ventricular ejection fraction, by estimation, is 60 to 65%. The  left ventricle has normal function. The left ventricle has no regional  wall motion abnormalities. There is mild concentric left ventricular  hypertrophy. Left ventricular diastolic  parameters are consistent with Grade I diastolic dysfunction (impaired  relaxation).   2. Right ventricular systolic function is normal. The right ventricular  size is normal. Tricuspid regurgitation signal is inadequate for assessing  PA pressure.   3. No evidence of mitral valve regurgitation.   4. There is moderate calcification of the aortic valve. Aortic valve  regurgitation is not visualized. Mild aortic valve stenosis.   5. The inferior vena cava is dilated in size with >50% respiratory  variability, suggesting right atrial pressure of 8 mmHg.   FINDINGS   Left Ventricle: Left ventricular ejection fraction, by estimation, is 60  to 65%. The left ventricle has normal function. The left ventricle has no  regional wall motion abnormalities. Definity contrast agent was given IV  to delineate the left ventricular  endocardial borders. The left ventricular internal cavity size was normal  in size. There is mild concentric left ventricular hypertrophy. Left  ventricular diastolic parameters are consistent with Grade I diastolic  dysfunction (impaired  relaxation).   Right Ventricle: The right ventricular size is normal. Right ventricular  systolic function is normal. Tricuspid regurgitation signal is inadequate  for assessing PA pressure.   Left Atrium: Left atrial size was normal in size.   Right Atrium: Right atrial size was normal in size.   Pericardium: There is no evidence of pericardial effusion.   Mitral Valve: No evidence of mitral valve regurgitation.   Tricuspid Valve: Tricuspid valve regurgitation is not demonstrated.   Aortic Valve: There is moderate calcification of the aortic valve. Aortic  valve regurgitation is not visualized. Mild aortic stenosis is present.  Aortic valve mean gradient measures 12.3 mmHg. Aortic valve peak gradient  measures 19.0 mmHg. Aortic valve  area, by VTI measures 1.26 cm.   Pulmonic Valve: Pulmonic valve regurgitation is not visualized.   Aorta: The aortic root and ascending aorta are structurally normal, with  no evidence of dilitation.   Venous: The inferior vena cava is dilated in size with greater than 50%  respiratory variability, suggesting right atrial pressure of 8 mmHg.   IAS/Shunts: No atrial level shunt detected by color flow Doppler.   Cath: 01/17/2023  Conclusions: Mild to moderate coronary artery disease.  Most severe lesion is a 70% stenosis involving the distal LCx, which is too small for intervention.  Elevated troponin is most likely due to supply-demand mismatch (type 2 MI) in the setting of COPD exacerbation, acute HFpEF, and atrial fibrillation with rapid ventricular response. Mildly elevated left ventricular filling pressure (LVEDP 20 mmHg).   Recommendations: Medical therapy and risk factor modification to prevent progression of coronary artery disease. Consider gentle diuresis.   Nelva Bush, MD [facility]  Patient Profile     77 y.o. female  known to have HTN, DM 2, tobacco abuse, 1.8 cm lung nodule, COPD is currently admitted to hospitalist  team for the management of acute on chronic hypoxic respiratory failure secondary to COPD exacerbation versus HFpEF exacerbation and new onset of A-fib with RVR. Cardiology was consulted for elevated troponins.   Assessment & Plan    Elevated Troponin -- High-sensitivity troponin peaked at 6255 in the setting of atrial fibrillation with RVR acute hypoxic respiratory failure.  EKG with diffuse T wave inversions (noted on prior tracings). Underwent cardiac cath noted above with 70% Lcx lesion, though vessel was too small for intervention. Suspect elevated troponin more consistent with demand ischemia.  -- Continue aspirin, statin, bisoprolol   Afib RVR -- Initially presented in atrial fibrillation with RVR which resolved after receiving 10 mg of IV diltiazem.  Remains in sinus rhythm with rates in the 70s. -- has been transitioned from IV heparin and Eliquis 5mg  BID  Hypertension -- slightly improved, but remains elevated  -- Continue bisoprolol, Norvasc, resume losartan 25mg  daily    Acute hypoxic respiratory failure COPD Chronic O2 dependent -- Reports wearing 3 L nasal cannula at all times at home, increases to 5 L with exertion. -- Being treated with nebs, steroids as well as antibiotics per primary.  Improving   Hyperlipidemia -- Increased Crestor to 20 mg daily   HFpEF -- Echo showed LVEF of 60 to 65%, no regional wall motion abnormality, grade 1 diastolic dysfunction, normal RV size and function, mild AS -- has been diuresed with IV lasix, net -  10.3L -- added SGLT2i, would use lasix 20mg  daily PRN at discharge    Left upper lobe lung nodule -- Seen by PCCM, plan for outpatient follow-up with Dr. Kimber Relic as well as PET scan with possible navigational bronchoscopy. Case discussed with PCCM per Dr. Ellyn Hack. Ok to proceed with cardiac cath and stent if needed. Work up will be done as an outpatient.     [place] will sign off.   Medication Recommendations:  noted  above Other recommendations (labs, testing, etc):  n/a Follow up as an outpatient:  will arrange  For questions or updates, please contact [place] Please consult [email or web address] for contact info under        Signed, Reino Bellis, NP  01/18/2023, 8:03 AM   .  ATTENDING ATTESTATION  I have seen, examined and evaluated the patient this AM on rounds along with Reino Bellis, NP-C.  After reviewing all the available data and chart, we discussed the patients laboratory, study & physical findings as well as symptoms in detail.  I agree with her findings, examination as well as impression recommendations as per our discussion.    Stable today -- added SGLT-I today along with PRN Lasix.   OK for d/c from CV standpoint.     Leonie Man, MD, MS Glenetta Hew, M.D., M.S. Interventional Cardiologist  [facility]  Pager # [phone] Phone # [phone] [address] [place], [place] [address]

## 2023-01-18 NOTE — Progress Notes (Signed)
CSW consulted for taxi voucher for patient. CSW provided taxi voucher to patients RN for patient. No further questions reported at this time.

## 2023-01-18 NOTE — Telephone Encounter (Signed)
Patient has scheduled hospital follow up 02/08/23 with Dr. Vaughan Browner but at this time is still admitted.  Front staff are currently scheduling PFT recalls.  LOV 10/26/22 patient was to return in 6 months after PFT.  PFT will be due for July 2024.  LOV 10/26/22- Dr. Vaughan Browner   Instructions  Continue the Trelegy Continue to work on smoking cessation Will order the portable concentrator Will refer for lung cancer screening CT Order PFTs in 6 months and return to clinic in 6 months after PFTs      Message routed to front desk for future July 2024 PFT scheduling.

## 2023-01-18 NOTE — TOC Transition Note (Signed)
Transition of Care [facility]) - CM/SW Discharge Note   Patient Details  Name: Deanna Petty MRN: [id number] Date of Birth: June 28, 1946  [facility] CM/SW Contact:  Bethena Roys, RN Phone Number: 01/18/2023, 1:50 PM   Clinical Narrative:  Patient plans for transition home today. Patient states she has oxygen in the home via [employer] on 3 Liters. Case Manager ordered a portable tank for travel home. PT/OT did not recommend any home health services. Plan will be to transition home in a cab. CSW to provide voucher to the patient. No further needs identified at this time.    Final next level of care: Home/Self Care Barriers to Discharge: No Barriers Identified   Patient Goals and CMS Choice   Choice offered to / list presented to : NA  Discharge Placement   Discharge Plan and Services Additional resources added to the After Visit Summary for   In-house Referral: NA Discharge Planning Services: CM Consult Post Acute Care Choice: NA              HH Arranged: NA   Social Determinants of Health (SDOH) Interventions SDOH Screenings   Food Insecurity: No Food Insecurity (01/16/2023)  Housing: Low Risk  (01/16/2023)  Transportation Needs: No Transportation Needs (01/16/2023)  Utilities: Not At Risk (01/16/2023)  Tobacco Use: High Risk (01/17/2023)   Readmission Risk Interventions     No data to display

## 2023-01-18 NOTE — Progress Notes (Signed)
Occupational Therapy Treatment Patient Details Name: Deanna Petty MRN: [id number] DOB: 06/24/1946 Today's Date: 01/18/2023   History of present illness 77 year old admitted for SOB and new onset Afib, COPD exacerbation and NSTEMI.  W/U also revealed nodule in LUL concerning for bronchogenic carcinoma.  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   OT comments  Pt progressing towards goals this session, completing LB ADL and toilet transfer with supervision- min A and use of SPC. VSS on supplemental O2 during session. Pt needing min cues for RUE precautions from heart cath while mobilizing/performing ADLs. Pt presenting with impairments listed below, will follow acutely. Anticipate no OT follow up needs at d/c pending progression.   Recommendations for follow up therapy are one component of a multi-disciplinary discharge planning process, led by the attending physician.  Recommendations may be updated based on patient status, additional functional criteria and insurance authorization.    Assistance Recommended at Discharge Intermittent Supervision/Assistance  Patient can return home with the following  A little help with walking and/or transfers;A little help with bathing/dressing/bathroom;Assistance with cooking/housework;Assist for transportation;Help with stairs or ramp for entrance   Equipment Recommendations  None recommended by OT    Recommendations for Other Services PT consult    Precautions / Restrictions Precautions Precautions: Fall Precaution Comments: 2L 02 Restrictions Weight Bearing Restrictions: No       Mobility Bed Mobility Overal bed mobility: Needs Assistance Bed Mobility: Supine to Sit, Sit to Supine     Supine to sit: Modified independent (Device/Increase time) Sit to supine: Modified independent (Device/Increase time)        Transfers Overall transfer level: Needs  assistance Equipment used: Quad cane Transfers: Sit to/from Stand Sit to Stand: Supervision                 Balance Overall balance assessment: Needs assistance Sitting-balance support: No upper extremity supported, Feet supported Sitting balance-Leahy Scale: Good     Standing balance support: Single extremity supported, During functional activity, Reliant on assistive device for balance Standing balance-Leahy Scale: Fair Standing balance comment: reliant on cane                           ADL either performed or assessed with clinical judgement   ADL Overall ADL's : Needs assistance/impaired                     Lower Body Dressing: Minimal assistance Lower Body Dressing Details (indicate cue type and reason): donning socks and pants Toilet Transfer: Min guard;Ambulation (cane)   Toileting- Clothing Manipulation and Hygiene: Supervision/safety       Functional mobility during ADLs: Min guard;Cane      Extremity/Trunk Assessment Upper Extremity Assessment Upper Extremity Assessment: Generalized weakness   Lower Extremity Assessment Lower Extremity Assessment: Defer to PT evaluation        Vision   Vision Assessment?: No apparent visual deficits   Perception Perception Perception: Not tested   Praxis Praxis Praxis: Not tested    Cognition Arousal/Alertness: Awake/alert Behavior During Therapy: WFL for tasks assessed/performed Overall Cognitive Status: Within Functional Limits for tasks assessed                                          Exercises  Shoulder Instructions       General Comments VSS on 2L O2    Pertinent Vitals/ Pain       Pain Assessment Pain Assessment: No/denies pain  Home Living                                          Prior Functioning/Environment              Frequency  Min 2X/week        Progress Toward Goals  OT Goals(current goals can now be found  in the care plan section)  Progress towards OT goals: Progressing toward goals  Acute Rehab OT Goals Patient Stated Goal: to go home OT Goal Formulation: With patient ADL Goals Pt Will Perform Tub/Shower Transfer: Tub transfer;tub bench;ambulating Additional ADL Goal #1: Pt will perform 3 consecutive tasks in standing to optimize activity tolerance and safety with return to ADL. Additional ADL Goal #2: Pt will identify and implement 3+ energy conservation techniques to utilize in the home setting.  Plan Discharge plan remains appropriate;Frequency remains appropriate    Co-evaluation                 AM-PAC OT "6 Clicks" Daily Activity     Outcome Measure   Help from another person eating meals?: None Help from another person taking care of personal grooming?: A Little Help from another person toileting, which includes using toliet, bedpan, or urinal?: A Little Help from another person bathing (including washing, rinsing, drying)?: A Little Help from another person to put on and taking off regular upper body clothing?: A Little Help from another person to put on and taking off regular lower body clothing?: A Little 6 Click Score: 19    End of Session Equipment Utilized During Treatment: Gait belt;Other (comment);Oxygen (cane)  OT Visit Diagnosis: Unsteadiness on feet (R26.81);Muscle weakness (generalized) (M62.81);Other abnormalities of gait and mobility (R26.89)   Activity Tolerance Patient tolerated treatment well   Patient Left in bed;with call bell/phone within reach;with bed alarm set;with nursing/sitter in room   Nurse Communication Mobility status        Time: [id number] OT Time Calculation (min): 24 min  Charges: OT Treatments $Self Care/Home Management : 8-22 mins $Therapeutic Activity: 8-22 mins  Deanna Petty, OTD, OTR/L SecureChat Preferred Acute Rehab [phone]   Deanna Petty 01/18/2023, 1:26 PM

## 2023-01-18 NOTE — Progress Notes (Signed)
Pt refused bipap. No distress noted at this time

## 2023-01-19 LAB — LIPOPROTEIN A (LPA): Lipoprotein (a): 17.1 nmol/L (ref <75.0)

## 2023-01-19 NOTE — Telephone Encounter (Signed)
Patient has been discharged from hospital.  ATC patient about ordering PET scan.  Left message for patient to call back.

## 2023-01-20 NOTE — Discharge Summary (Signed)
Physician Discharge Summary   Patient: Deanna Petty MRN: [id number] DOB: [birth year]  Admit date:     01/12/2023  Discharge date: 01/18/2023  Discharge Physician: Kathlen Mody   PCP: Pcp, No   Recommendations at discharge:  {Tip this will not be part of the note when signed- Example include specific recommendations for outpatient follow-up, pending tests to follow-up on. (Optional):26781}  ***  Discharge Diagnoses: Principal Problem:   COPD with acute exacerbation Active Problems:   Hypercoagulable state due to paroxysmal atrial fibrillation   Acute on chronic diastolic CHF (congestive heart failure)   Tobacco use   Type 2 diabetes mellitus with hyperlipidemia   Elevated brain natriuretic peptide (BNP) level   Acute on chronic respiratory failure with hypoxia and hypercapnia   Essential hypertension   Hypomagnesemia   Chronic respiratory failure with hypoxia   New onset atrial fibrillation   Myocardial infarction due to demand ischemia   Hypokalemia  Resolved Problems:   * No resolved hospital problems. [facility] Course: No notes on file  Assessment and Plan: No notes have been filed under this hospital service. Service: Hospitalist     {Tip this will not be part of the note when signed Body mass index is 33.31 kg/m. ,  Nutrition Documentation    Flowsheet Row ED to Hosp-Admission (Discharged) from 01/12/2023 in [facility]  Nutrition Problem Predicted suboptimal nutrient intake  Etiology --  [hospitalization]  Nutrition Goal Patient will meet greater than or equal to 90% of their needs  Interventions Education     ,  (Optional):26781}  {(NOTE) Pain control PDMP Statment (Optional):26782} Consultants: *** Procedures performed: ***  Disposition: {Plan; Disposition:26390} Diet recommendation:  Discharge Diet Orders (From admission, onward)     Start     Ordered   01/18/23 0000  Diet - low sodium heart healthy        01/18/23 1319    01/14/23 0000  Diet - low sodium heart healthy        01/14/23 0846   01/14/23 0000  Diet Carb Modified        01/14/23 0846           {Diet_Plan:26776} DISCHARGE MEDICATION: Allergies as of 01/18/2023   No Known Allergies      Medication List     STOP taking these medications    atorvastatin 10 MG tablet Commonly known as: LIPITOR   metoprolol succinate 50 MG 24 hr tablet Commonly known as: TOPROL-XL       TAKE these medications    albuterol (2.5 MG/3ML) 0.083% nebulizer solution Commonly known as: PROVENTIL Take 3 mLs (2.5 mg total) by nebulization every 6 (six) hours as needed for shortness of breath or wheezing.   amLODipine 10 MG tablet Commonly known as: NORVASC Take 10 mg by mouth daily.   ASCORBIC ACID PO Take 1 tablet by mouth daily. Vitamin C, unknown strength   Aspirin Low Dose 81 MG tablet Generic drug: aspirin EC Take 1 tablet (81 mg total) by mouth daily. Swallow whole.   bisoprolol 5 MG tablet Commonly known as: ZEBETA Take 1 tablet (5 mg total) by mouth daily.   CALCIUM + VITAMIN D3 PO Take 1 tablet by mouth daily.   Eliquis 5 MG Tabs tablet Generic drug: apixaban Take 1 tablet (5 mg total) by mouth 2 (two) times daily.   Farxiga 10 MG Tabs tablet Generic drug: dapagliflozin propanediol Take 1 tablet (10 mg total) by mouth daily.   fluticasone  50 MCG/ACT nasal spray Commonly known as: FLONASE Place 1-2 sprays into both nostrils daily as needed for allergies.   furosemide 20 MG tablet Commonly known as: Lasix Take 1 tablet (20 mg total) by mouth daily as needed for edema or fluid.   losartan 25 MG tablet Commonly known as: COZAAR Take 1 tablet (25 mg total) by mouth daily. What changed:  medication strength how much to take when to take this   metFORMIN 1000 MG tablet Commonly known as: GLUCOPHAGE Take 1,000 mg by mouth 2 (two) times daily. What changed: Another medication with the same name was removed. Continue taking  this medication, and follow the directions you see here.   multivitamin tablet Take 1 tablet by mouth daily.   nicotine polacrilex 2 MG gum Commonly known as: NICORETTE Take 1 each (2 mg total) by mouth as needed for smoking cessation.   pantoprazole 40 MG tablet Commonly known as: PROTONIX Take 1 tablet (40 mg total) by mouth daily.   rosuvastatin 20 MG tablet Commonly known as: CRESTOR Take 1 tablet (20 mg total) by mouth daily.   Trelegy Ellipta 100-62.5-25 MCG/ACT Aepb Generic drug: Fluticasone-Umeclidin-Vilant Inhale 1 puff into the lungs daily.   TYLENOL PO Take 2 tablets by mouth daily as needed (pain).        Follow-up Information     [facility]   Specialty: Family Medicine Why: Please mak a appointment to establish primary care doctor Contact information: [address], [address] [place] [id number] [phone]        Chilton GreathouseMannam, Praveen, MD. Schedule an appointment as soon as possible for a visit.   Specialty: Pulmonary Disease Why: Make appointment immediately for follow-up with Dr. Isaiah SergeMannam to discuss lung nodule/bronchoscopy Contact information: [address] [place] [address] [phone]         Loura BackNguyen, Kim, NP. Schedule an appointment as soon as possible for a visit in 1 week(s).   Specialty: Nurse Practitioner Contact information: [address] [place] [address] [phone]                Discharge Exam: Ceasar MonsFiled Weights   01/16/23 0422 01/17/23 0433 01/18/23 0427  Weight: 84.9 kg 84.3 kg 82.6 kg   ***  Condition at discharge: {DC Condition:26389}  The results of significant diagnostics from this hospitalization (including imaging, microbiology, ancillary and laboratory) are listed below for reference.   Imaging Studies: CARDIAC CATHETERIZATION  Result Date: 01/17/2023 Conclusions: Mild to moderate coronary artery disease.  Most severe lesion is a 70%  stenosis involving the distal LCx, which is too small for intervention.  Elevated troponin is most likely due to supply-demand mismatch (type 2 MI) in the setting of COPD exacerbation, acute HFpEF, and atrial fibrillation with rapid ventricular response. Mildly elevated left ventricular filling pressure (LVEDP 20 mmHg). Recommendations: Medical therapy and risk factor modification to prevent progression of coronary artery disease. Consider gentle diuresis. Yvonne Kendallhristopher End, MD [facility]  DG CHEST PORT 1 VIEW  Result Date: 01/15/2023 CLINICAL DATA:  Dyspnea EXAM: PORTABLE CHEST 1 VIEW COMPARISON:  Three days ago FINDINGS: Borderline heart size. Negative aortic and hilar contours accounting for rotation. Increased patchy density in the lungs. Known nodule in the superior segment the left lower lobe, recognized in the chart. No visible effusion or pneumothorax IMPRESSION: 1. Increased patchy pulmonary density could be pneumonia or developing edema. 2. Known left lower lobe nodule. Electronically Signed   By: Christiane HaJonathan  Watts M.D.   On: 01/15/2023 05:07   ECHOCARDIOGRAM COMPLETE  Result Date: 01/13/2023    ECHOCARDIOGRAM REPORT   Patient Name:   Deanna Petty Date of Exam: 01/13/2023 Medical Rec #:  [id number]     Height:       62.0 in Accession #:    [id number]    Weight:       186.1 lb Date of Birth:  June 29, 1946    BSA:          1.854 m Patient Age:    76 years      BP:           143/55 mmHg Patient Gender: F             HR:           73 bpm. Exam Location:  Inpatient Procedure: 2D Echo, Cardiac Doppler, Color Doppler and Intracardiac            Opacification Agent Indications:    elevated troponin  History:        Patient has prior history of Echocardiogram examinations. COPD.  Sonographer:    Mike Gip Referring Phys: [id number] ANASTASSIA DOUTOVA IMPRESSIONS  1. Left ventricular ejection fraction, by estimation, is 60 to 65%. The left ventricle has normal function. The left ventricle has no regional wall  motion abnormalities. There is mild concentric left ventricular hypertrophy. Left ventricular diastolic parameters are consistent with Grade I diastolic dysfunction (impaired relaxation).  2. Right ventricular systolic function is normal. The right ventricular size is normal. Tricuspid regurgitation signal is inadequate for assessing PA pressure.  3. No evidence of mitral valve regurgitation.  4. There is moderate calcification of the aortic valve. Aortic valve regurgitation is not visualized. Mild aortic valve stenosis.  5. The inferior vena cava is dilated in size with >50% respiratory variability, suggesting right atrial pressure of 8 mmHg. FINDINGS  Left Ventricle: Left ventricular ejection fraction, by estimation, is 60 to 65%. The left ventricle has normal function. The left ventricle has no regional wall motion abnormalities. Definity contrast agent was given IV to delineate the left ventricular  endocardial borders. The left ventricular internal cavity size was normal in size. There is mild concentric left ventricular hypertrophy. Left ventricular diastolic parameters are consistent with Grade I diastolic dysfunction (impaired relaxation). Right Ventricle: The right ventricular size is normal. Right ventricular systolic function is normal. Tricuspid regurgitation signal is inadequate for assessing PA pressure. Left Atrium: Left atrial size was normal in size. Right Atrium: Right atrial size was normal in size. Pericardium: There is no evidence of pericardial effusion. Mitral Valve: No evidence of mitral valve regurgitation. Tricuspid Valve: Tricuspid valve regurgitation is not demonstrated. Aortic Valve: There is moderate calcification of the aortic valve. Aortic valve regurgitation is not visualized. Mild aortic stenosis is present. Aortic valve mean gradient measures 12.3 mmHg. Aortic valve peak gradient measures 19.0 mmHg. Aortic valve area, by VTI measures 1.26 cm. Pulmonic Valve: Pulmonic valve  regurgitation is not visualized. Aorta: The aortic root and ascending aorta are structurally normal, with no evidence of dilitation. Venous: The inferior vena cava is dilated in size with greater than 50% respiratory variability, suggesting right atrial pressure of 8 mmHg. IAS/Shunts: No atrial level shunt detected by color flow Doppler.  LEFT VENTRICLE PLAX 2D LVIDd:         4.00 cm     Diastology LVIDs:         2.00 cm     LV e' medial:  6.09 cm/s LV PW:         1.30 cm     LV E/e' medial:  19.5 LV IVS:        1.20 cm     LV e' lateral:   8.70 cm/s LVOT diam:     1.80 cm     LV E/e' lateral: 13.7 LV SV:         67 LV SV Index:   36 LVOT Area:     2.54 cm  LV Volumes (MOD) LV vol d, MOD A2C: 88.4 ml LV vol d, MOD A4C: 82.3 ml LV vol s, MOD A2C: 22.6 ml LV vol s, MOD A4C: 19.1 ml LV SV MOD A2C:     65.8 ml LV SV MOD A4C:     82.3 ml LV SV MOD BP:      64.4 ml RIGHT VENTRICLE             IVC RV Basal diam:  3.40 cm     IVC diam: 2.40 cm RV S prime:     11.50 cm/s TAPSE (M-mode): 1.5 cm LEFT ATRIUM             Index        RIGHT ATRIUM           Index LA diam:        4.20 cm 2.27 cm/m   RA Area:     13.40 cm LA Vol (A2C):   54.1 ml 29.18 ml/m  RA Volume:   29.30 ml  15.80 ml/m LA Vol (A4C):   51.0 ml 27.51 ml/m LA Biplane Vol: 55.9 ml 30.15 ml/m  AORTIC VALVE AV Area (Vmax):    1.25 cm AV Area (Vmean):   1.12 cm AV Area (VTI):     1.26 cm AV Vmax:           217.67 cm/s AV Vmean:          170.667 cm/s AV VTI:            0.531 m AV Peak Grad:      19.0 mmHg AV Mean Grad:      12.3 mmHg LVOT Vmax:         107.00 cm/s LVOT Vmean:        75.200 cm/s LVOT VTI:          0.263 m LVOT/AV VTI ratio: 0.49  AORTA Ao Root diam: 2.80 cm Ao Asc diam:  2.50 cm MITRAL VALVE MV Area (PHT): 3.19 cm     SHUNTS MV Decel Time: 238 msec     Systemic VTI:  0.26 m MV E velocity: 119.00 cm/s  Systemic Diam: 1.80 cm MV A velocity: 140.00 cm/s MV E/A ratio:  0.85 [place] signed by Carolan Clines Signature Date/Time:  01/13/2023/11:25:37 AM    Final    CT Angio Chest PE W and/or Wo Contrast  Result Date: 01/12/2023 CLINICAL DATA:  Pulmonary embolus suspected with high probability. Weakness and nausea. EXAM: CT ANGIOGRAPHY CHEST WITH CONTRAST TECHNIQUE: Multidetector CT imaging of the chest was performed using the standard protocol during bolus administration of intravenous contrast. Multiplanar CT image reconstructions and MIPs were obtained to evaluate the vascular anatomy. RADIATION DOSE REDUCTION: This exam was performed according to the departmental dose-optimization program which includes automated exposure control, adjustment of the mA and/or kV according to patient size and/or use of iterative reconstruction technique. CONTRAST:  18mL OMNIPAQUE IOHEXOL 350 MG/ML SOLN COMPARISON:  09/18/2021 FINDINGS: Cardiovascular:  Good opacification of the central and segmental pulmonary arteries representing technically adequate study. No focal filling defects. No evidence of significant pulmonary embolus. Normal heart size. No pericardial effusions. Calcification of the mitral valve annulus, coronary arteries, and aorta. No aortic aneurysm or dissection. Mediastinum/Nodes: Esophagus is decompressed with minimal esophageal hiatal hernia. Thyroid gland is unremarkable. No significant lymphadenopathy. Lungs/Pleura: Motion artifact limits examination. Mildly spiculated nodule in the superior segment of the left upper lung measuring 1.5 x 1.8 cm diameter. This is enlarged since the previous study, at which time it measured 4 mm diameter. 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 Pulmonary consultation is suggested. No focal consolidation or airspace disease. No pleural effusions. No pneumothorax. Mild atelectasis in the lung bases. Upper Abdomen: Cholelithiasis.  No acute Musculoskeletal: Abnormalities. Degenerative changes in the spine. No destructive bone lesions. Review of the  MIP images confirms the above findings. IMPRESSION: 1. No evidence of significant pulmonary embolus. 2. 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 Pulmonary consultation is suggested. 3. Aortic atherosclerosis. Electronically Signed   By: Burman Nieves M.D.   On: 01/12/2023 20:24   DG Chest Portable 1 View  Result Date: 01/12/2023 CLINICAL DATA:  Shortness of breath EXAM: PORTABLE CHEST 1 VIEW COMPARISON:  01/17/22 FINDINGS: Atherosclerotic calcification of the aortic arch. Borderline enlargement of the cardiopericardial silhouette. Mild interstitial accentuation, as can commonly be encountered in smokers. No edema or airspace opacity. No blunting of the costophrenic angles. IMPRESSION: 1. Borderline enlargement of the cardiopericardial silhouette, without edema. 2. Mild interstitial accentuation, as can commonly be encountered in smokers. 3.  Aortic Atherosclerosis (ICD10-I70.0). Electronically Signed   By: Gaylyn Rong M.D.   On: 01/12/2023 16:26    Microbiology: Results for orders placed or performed during the hospital encounter of 01/12/23  Resp panel by RT-PCR (RSV, Flu A&B, Covid) Anterior Nasal Swab     Status: None   Collection Time: 01/12/23  3:40 PM   Specimen: Anterior Nasal Swab  Result Value Ref Range Status   SARS Coronavirus 2 by RT PCR NEGATIVE NEGATIVE Final   Influenza A by PCR NEGATIVE NEGATIVE Final   Influenza B by PCR NEGATIVE NEGATIVE Final    Comment: (NOTE) The Xpert Xpress SARS-CoV-2/FLU/RSV plus assay is intended as an aid in the diagnosis of influenza from Nasopharyngeal swab specimens and should not be used as a sole basis for treatment. Nasal washings and aspirates are unacceptable for Xpert Xpress SARS-CoV-2/FLU/RSV testing.  Fact Sheet for Patients: BloggerCourse.com  Fact Sheet for Healthcare Providers: SeriousBroker.it  This  test is not yet approved or cleared by the [place] FDA and has been authorized for detection and/or diagnosis of SARS-CoV-2 by FDA under an Emergency Use Authorization (EUA). This EUA will remain in effect (meaning this test can be used) for the duration of the COVID-19 declaration under Section 564(b)(1) of the Act, 21 U.S.C. section 360bbb-3(b)(1), unless the authorization is terminated or revoked.     Resp Syncytial Virus by PCR NEGATIVE NEGATIVE Final    Comment: (NOTE) Fact Sheet for Patients: BloggerCourse.com  Fact Sheet for Healthcare Providers: SeriousBroker.it  This test is not yet approved or cleared by the [place] FDA and has been authorized for detection and/or diagnosis of SARS-CoV-2 by FDA under an Emergency Use Authorization (EUA). This EUA will remain in effect (meaning this test can be used) for the duration of the COVID-19 declaration under Section 564(b)(1) of the Act,  21 U.S.C. section 360bbb-3(b)(1), unless the authorization is terminated or revoked.  Performed at [facility], [address] [place], [place] [address]     Labs: CBC: Recent Labs  Lab 01/14/23 0231 01/15/23 0150 01/16/23 0208 01/17/23 0642 01/18/23 0215  WBC 14.7* 13.3* 12.8* 8.3 9.8  HGB 12.4 12.7 12.8 12.2 13.4  HCT 39.7 41.0 40.6 38.1 41.4  MCV 95.7 97.4 95.8 94.3 93.0  PLT 165 157 139* 121* 135*   Basic Metabolic Panel: Recent Labs  Lab 01/14/23 0231 01/15/23 0150 01/16/23 0208 01/17/23 0642 01/18/23 0215  NA 136 139 141 141 138  K 5.0 5.4* 5.1 4.5 4.1  CL 97* 95* 94* 96* 92*  CO2 31 33* 39* 36* 33*  GLUCOSE 191* 184* 154* 145* 107*  BUN 17 16 21 17 15   CREATININE 0.78 0.72 0.95 0.77 0.81  CALCIUM 9.0 9.0 9.1 8.7* 9.2  MG 1.9 2.1 1.9 1.9 1.8  PHOS 3.4 4.4 4.3 3.5 4.1   Liver Function Tests: Recent Labs  Lab 01/14/23 0231 01/15/23 0150 01/16/23 0208 01/17/23 0642 01/18/23 0215  ALBUMIN  3.1* 3.2* 3.1* 2.8* 3.0*   CBG: Recent Labs  Lab 01/17/23 2036 01/17/23 2358 01/18/23 0434 01/18/23 0807 01/18/23 1147  GLUCAP 189* 111* 124* 206* 141*    Discharge time spent: {LESS THAN/GREATER THAN:26388} 30 minutes.  Signed: Kathlen ModyVijaya Swannie Milius, MD [facility] 01/20/2023

## 2023-01-20 NOTE — Telephone Encounter (Signed)
Called and spoke with patient.  Patient agreed to PET scan. PET scan order placed next available. Patient has hospital follow up 02/08/23 with Dr. Isaiah Serge.  Nothing further at this time.

## 2023-01-30 ENCOUNTER — Ambulatory Visit: Payer: Medicare (Managed Care) | Admitting: Nurse Practitioner

## 2023-01-30 NOTE — Progress Notes (Deleted)
Office Visit    Patient Name: Deanna Petty Date of Encounter: 01/30/2023  Primary Care Provider:  Pcp, No Primary Cardiologist:  None  Chief Complaint    77 year old female with a history of CAD, paroxysmal atrial fibrillation, chronic diastolic heart failure, aortic stenosis, hypertension, hyperlipidemia, type 2 diabetes, COPD/chronic hypoxic respiratory failure on 3 L O2, type 2 diabetes, tobacco use, and lung nodule who presents for hospital follow-up related to atrial fibrillation and heart failure.  Past Medical History    Past Medical History:  Diagnosis Date   Bronchitis    Cigarette smoker    COPD (chronic obstructive pulmonary disease)    Pneumonia    Shortness of breath    with exertion; lasted used inhaler 06/02/13   Past Surgical History:  Procedure Laterality Date   ABDOMINAL HYSTERECTOMY     BREAST LUMPECTOMY WITH NEEDLE LOCALIZATION Left 06/12/2013   Procedure: LEFT BREAST LUMPECTOMY WITH NEEDLE LOCALIZATION;  Surgeon: Wilmon Arms. Corliss Skains, MD;  Location: [facility] OR;  Service: General;  Laterality: Left;   BREAST SURGERY     HIP FRACTURE SURGERY Right    LEFT HEART CATH AND CORONARY ANGIOGRAPHY N/A 01/17/2023   Procedure: LEFT HEART CATH AND CORONARY ANGIOGRAPHY;  Surgeon: Yvonne Kendall, MD;  Location: [facility] INVASIVE [facility];  Service: Cardiovascular;  Laterality: N/A;    Allergies  No Known Allergies   Labs/Other Studies Reviewed    The following studies were reviewed today:  Echo: 01/13/2023   IMPRESSIONS    1. Left ventricular ejection fraction, by estimation, is 60 to 65%. The  left ventricle has normal function. The left ventricle has no regional  wall motion abnormalities. There is mild concentric left ventricular  hypertrophy. Left ventricular diastolic  parameters are consistent with Grade I diastolic dysfunction (impaired  relaxation).   2. Right ventricular systolic function is normal. The right ventricular  size is normal. Tricuspid regurgitation  signal is inadequate for assessing  PA pressure.   3. No evidence of mitral valve regurgitation.   4. There is moderate calcification of the aortic valve. Aortic valve  regurgitation is not visualized. Mild aortic valve stenosis.   5. The inferior vena cava is dilated in size with >50% respiratory  variability, suggesting right atrial pressure of 8 mmHg.   Cath: 01/17/2023   Conclusions: Mild to moderate coronary artery disease.  Most severe lesion is a 70% stenosis involving the distal LCx, which is too small for intervention.  Elevated troponin is most likely due to supply-demand mismatch (type 2 MI) in the setting of COPD exacerbation, acute HFpEF, and atrial fibrillation with rapid ventricular response. Mildly elevated left ventricular filling pressure (LVEDP 20 mmHg).   Recommendations: Medical therapy and risk factor modification to prevent progression of coronary artery disease. Consider gentle diuresis. Recent Labs: 01/12/2023: TSH 0.573 01/13/2023: ALT 26 01/17/2023: B Natriuretic Peptide 241.7 01/18/2023: BUN 15; Creatinine, Ser 0.81; Hemoglobin 13.4; Magnesium 1.8; Platelets 135; Potassium 4.1; Sodium 138  Recent Lipid Panel    Component Value Date/Time   CHOL 92 01/13/2023 0150   TRIG 46 01/13/2023 0150   HDL 51 01/13/2023 0150   CHOLHDL 1.8 01/13/2023 0150   VLDL 9 01/13/2023 0150   LDLCALC 32 01/13/2023 0150    History of Present Illness    77 year old female with the above past medical history including CAD, paroxysmal atrial fibrillation, chronic diastolic heart failure, aortic stenosis, hypertension, hyperlipidemia, type 2 diabetes, COPD/chronic hypoxic respiratory failure on 3 L O2, type 2 diabetes, tobacco use, and  lung nodule.  She was previously evaluated by Dr. Excell Seltzer in 2014 in the setting of abnormal EKG prior to surgical intervention.  Echocardiogram was recommended at that time, but not completed.  She presented to the ED on 01/12/2023 feeling "generally unwell."   She was tachycardic, EKG showed atrial fibrillation with RVR.  Troponin was elevated.  CT of the chest incidentally noted mitral annular calcification, aortic calcifications, coronary artery calcification, no dissection or aneurysm.  She was also noted to have a mildly spiculated mass in the superior segment of the left upper lung, significantly enlarged from last imaging.  Cardiology was consulted in the setting of A-fib with RVR, type II MI.  Echocardiogram showed EF 60 to 65%, normal RV function, no RWMA, mild concentric LVH, G1 DD, moderate calcification of the aortic valve, mild aortic stenosis.  Cardiac catheterization revealed mild to moderate CAD with up to 70% stenosis involving the distal left circumflex, too small for intervention.  It was felt that her elevated troponin was most likely due to supply demand mismatch in the setting of COPD exacerbation, acute diastolic heart failure, and atrial fibrillation with RVR.  She converted to sinus rhythm with IV diltiazem.  She was started on Eliquis post cath.  BP was elevated, she was started on amlodipine.  She was diuresed with IV Lasix, transitioned to oral Lasix and started on Farxiga.  PCCM was consulted in the setting of acute hypoxic respiratory failure, COPD exacerbation, lung nodule.  Outpatient follow-up with pulmonology was recommended.  She was discharged home in stable condition on 01/18/2023.  She presents today for follow-up.  Since her hospitalization  CAD: Paroxysmal atrial fibrillation: Chronic diastolic heart failure: Aortic stenosis: Hypertension: Hyperlipidemia: Type 2 diabetes: COPD: Tobacco use: Disposition:   Home Medications    Current Outpatient Medications  Medication Sig Dispense Refill   Acetaminophen (TYLENOL PO) Take 2 tablets by mouth daily as needed (pain).     albuterol (PROVENTIL) (2.5 MG/3ML) 0.083% nebulizer solution Take 3 mLs (2.5 mg total) by nebulization every 6 (six) hours as needed for shortness of  breath or wheezing. 180 mL 0   amLODipine (NORVASC) 10 MG tablet Take 10 mg by mouth daily.     apixaban (ELIQUIS) 5 MG TABS tablet Take 1 tablet (5 mg total) by mouth 2 (two) times daily. 60 tablet 2   ASCORBIC ACID PO Take 1 tablet by mouth daily. Vitamin C, unknown strength     aspirin EC 81 MG tablet Take 1 tablet (81 mg total) by mouth daily. Swallow whole. 30 tablet 2   bisoprolol (ZEBETA) 5 MG tablet Take 1 tablet (5 mg total) by mouth daily. 30 tablet 2   Calcium Carb-Cholecalciferol (CALCIUM + VITAMIN D3 PO) Take 1 tablet by mouth daily.     dapagliflozin propanediol (FARXIGA) 10 MG TABS tablet Take 1 tablet (10 mg total) by mouth daily. 30 tablet 2   fluticasone (FLONASE) 50 MCG/ACT nasal spray Place 1-2 sprays into both nostrils daily as needed for allergies.     Fluticasone-Umeclidin-Vilant (TRELEGY ELLIPTA) 100-62.5-25 MCG/ACT AEPB Inhale 1 puff into the lungs daily. 28 each 5   furosemide (LASIX) 20 MG tablet Take 1 tablet (20 mg total) by mouth daily as needed for edema or fluid. 30 tablet 1   losartan (COZAAR) 25 MG tablet Take 1 tablet (25 mg total) by mouth daily. 30 tablet 2   metFORMIN (GLUCOPHAGE) 1000 MG tablet Take 1,000 mg by mouth 2 (two) times daily.     Multiple  Vitamin (MULTIVITAMIN) tablet Take 1 tablet by mouth daily.     nicotine polacrilex (NICORETTE) 2 MG gum Take 1 each (2 mg total) by mouth as needed for smoking cessation. 100 tablet 0   pantoprazole (PROTONIX) 40 MG tablet Take 1 tablet (40 mg total) by mouth daily. 30 tablet 0   rosuvastatin (CRESTOR) 20 MG tablet Take 1 tablet (20 mg total) by mouth daily. 30 tablet 2   No current facility-administered medications for this visit.     Review of Systems    ***.  All other systems reviewed and are otherwise negative except as noted above.    Physical Exam    VS:  There were no vitals taken for this visit. , BMI There is no height or weight on file to calculate BMI.     GEN: Well nourished, well  developed, in no acute distress. HEENT: normal. Neck: Supple, no JVD, carotid bruits, or masses. Cardiac: RRR, no murmurs, rubs, or gallops. No clubbing, cyanosis, edema.  Radials/DP/PT 2+ and equal bilaterally.  Respiratory:  Respirations regular and unlabored, clear to auscultation bilaterally. GI: Soft, nontender, nondistended, BS + x 4. MS: no deformity or atrophy. Skin: warm and dry, no rash. Neuro:  Strength and sensation are intact. Psych: Normal affect.  Accessory Clinical Findings    ECG personally reviewed by me today - *** - no acute changes.   Lab Results  Component Value Date   WBC 9.8 01/18/2023   HGB 13.4 01/18/2023   HCT 41.4 01/18/2023   MCV 93.0 01/18/2023   PLT 135 (L) 01/18/2023   Lab Results  Component Value Date   CREATININE 0.81 01/18/2023   BUN 15 01/18/2023   NA 138 01/18/2023   K 4.1 01/18/2023   CL 92 (L) 01/18/2023   CO2 33 (H) 01/18/2023   Lab Results  Component Value Date   ALT 26 01/13/2023   AST 47 (H) 01/13/2023   ALKPHOS 63 01/13/2023   BILITOT 0.5 01/13/2023   Lab Results  Component Value Date   CHOL 92 01/13/2023   HDL 51 01/13/2023   LDLCALC 32 01/13/2023   TRIG 46 01/13/2023   CHOLHDL 1.8 01/13/2023    Lab Results  Component Value Date   HGBA1C 7.4 (H) 01/12/2023    Assessment & Plan    1.  ***  No BP recorded.  {Refresh Note OR Click here to enter BP  :1}***   Joylene Grapes, NP 01/30/2023, 5:29 AM

## 2023-02-01 ENCOUNTER — Ambulatory Visit: Payer: Medicare (Managed Care)

## 2023-02-08 ENCOUNTER — Telehealth: Payer: Self-pay | Admitting: Pulmonary Disease

## 2023-02-08 ENCOUNTER — Inpatient Hospital Stay: Payer: Medicare (Managed Care) | Admitting: Pulmonary Disease

## 2023-02-08 NOTE — Telephone Encounter (Signed)
Dr. Selena Batten Nguyen's office calling for last office notes and any imaging we ordered. Fax number below. TY.

## 2023-02-08 NOTE — Telephone Encounter (Signed)
Loura Back [facility] Fax [phone]   Please send Dr. Cyndie Chime any imaging and office notes to the fax # above. TY

## 2023-02-08 NOTE — Telephone Encounter (Signed)
Patient was originally scheduled for an appt today but it was rescheduled until May. Her PET has been rescheduled for May as well. When send notes in May after appt.

## 2023-02-16 ENCOUNTER — Encounter (HOSPITAL_COMMUNITY): Payer: Medicare (Managed Care)

## 2023-02-17 ENCOUNTER — Inpatient Hospital Stay: Payer: Medicare (Managed Care) | Admitting: Nurse Practitioner

## 2023-02-23 ENCOUNTER — Ambulatory Visit: Payer: Medicare (Managed Care) | Admitting: Nurse Practitioner

## 2023-02-27 ENCOUNTER — Other Ambulatory Visit: Payer: Self-pay | Admitting: *Deleted

## 2023-02-27 DIAGNOSIS — I739 Peripheral vascular disease, unspecified: Secondary | ICD-10-CM

## 2023-03-03 ENCOUNTER — Inpatient Hospital Stay: Payer: Medicare (Managed Care) | Admitting: Nurse Practitioner

## 2023-03-03 NOTE — Progress Notes (Deleted)
@Patient  ID: Deanna Petty, female    DOB: October 22, 1945, 77 y.o.   MRN: [id number]  No chief complaint on file.   Referring provider: Loura Back, NP  HPI: 77 year old female, active smoker followed for COPD, chronic respiratory failure on supplemental oxygen, mild OSA not on CPAP.  She is a patient of Dr. Shirlee More last seen in office 08/05/2020.  Past medical history significant for CHF, hypertension, DM 2.  TEST/EVENTS:  09/18/2021 CTA chest: Mild cardiomegaly with moderate patchy three-vessel calcific CAD.  Heavy aortic calcification.  Shotty up to borderline lymph nodes continue to be noted in the precarinal, subcarinal and left-sided prevascular mediastinum, unchanged.  There is asymmetric posterior basal left lower lobe increased opacity with groundglass appearance, and an additional left lower lobe infrahilar groundglass opacity.  There is increasing bronchial thickening in both lower lobes as well.  Remaining lungs are clear. 01/17/2022 CXR 1 view: Improved aeration within the right lung with prominent interstitial markings remaining in the right lung base. 04/05/2022 CXR: Scarring/atelectasis in both lung bases.  No acute process  05/24/2022: Today-follow-up Patient presents today for overdue follow-up.  She was seen in the ED at the end of June for AECOPD.  She has also had multiple hospitalizations since we saw her last for acute on chronic respiratory failure and AECOPD.  Today, she reports that her breathing has been overall stable since her last ED visit.  She does notice that her oxygen drops when she is up walking but she does not wear her oxygen consistently. She is usually around the mid 80's with activity; recovers after resting for a period of time to the 90's. She feels like her legs get weak when she's up moving around too, which limits her activity tolerance. She has some occasionally wheezing, which she will use her albuterol rescue for, usually once a day. She denies any cough,  orthopnea, PND, leg swelling, hemoptysis, fevers, night sweats, anorexia, weight loss. She is currently using Anoro inhaler daily. Doesn't use her nebulizer machine. She is not on CPAP at night, sleeps okay at night. Denies drowsy driving or morning headaches. She is still smoking 2-3 cigarettes a day. Nicotine patches irritate her skin.   No Known Allergies  Immunization History  Administered Date(s) Administered   Fluad Quad(high Dose 65+) 07/05/2020   Moderna Sars-Covid-2 Vaccination 03/27/2020, 04/24/2020   Pneumococcal Polysaccharide-23 07/07/2020    Past Medical History:  Diagnosis Date   Bronchitis    Cigarette smoker    COPD (chronic obstructive pulmonary disease) (HCC)    Pneumonia    Shortness of breath    with exertion; lasted used inhaler 06/02/13    Tobacco History: Social History   Tobacco Use  Smoking Status Some Days   Packs/day: 0.25   Years: 40.00   Additional pack years: 0.00   Total pack years: 10.00   Types: Cigarettes  Smokeless Tobacco Never  Tobacco Comments   Smokes 4/day 10/26/22   Ready to quit: Not Answered Counseling given: Not Answered Tobacco comments: Smokes 4/day 10/26/22   Outpatient Medications Prior to Visit  Medication Sig Dispense Refill   Acetaminophen (TYLENOL PO) Take 2 tablets by mouth daily as needed (pain).     albuterol (PROVENTIL) (2.5 MG/3ML) 0.083% nebulizer solution Take 3 mLs (2.5 mg total) by nebulization every 6 (six) hours as needed for shortness of breath or wheezing. 180 mL 0   amLODipine (NORVASC) 10 MG tablet Take 10 mg by mouth daily.     apixaban (ELIQUIS) 5  MG TABS tablet Take 1 tablet (5 mg total) by mouth 2 (two) times daily. 60 tablet 2   ASCORBIC ACID PO Take 1 tablet by mouth daily. Vitamin C, unknown strength     aspirin EC 81 MG tablet Take 1 tablet (81 mg total) by mouth daily. Swallow whole. 30 tablet 2   bisoprolol (ZEBETA) 5 MG tablet Take 1 tablet (5 mg total) by mouth daily. 30 tablet 2   Calcium  Carb-Cholecalciferol (CALCIUM + VITAMIN D3 PO) Take 1 tablet by mouth daily.     dapagliflozin propanediol (FARXIGA) 10 MG TABS tablet Take 1 tablet (10 mg total) by mouth daily. 30 tablet 2   fluticasone (FLONASE) 50 MCG/ACT nasal spray Place 1-2 sprays into both nostrils daily as needed for allergies.     Fluticasone-Umeclidin-Vilant (TRELEGY ELLIPTA) 100-62.5-25 MCG/ACT AEPB Inhale 1 puff into the lungs daily. 28 each 5   furosemide (LASIX) 20 MG tablet Take 1 tablet (20 mg total) by mouth daily as needed for edema or fluid. 30 tablet 1   losartan (COZAAR) 25 MG tablet Take 1 tablet (25 mg total) by mouth daily. 30 tablet 2   metFORMIN (GLUCOPHAGE) 1000 MG tablet Take 1,000 mg by mouth 2 (two) times daily.     Multiple Vitamin (MULTIVITAMIN) tablet Take 1 tablet by mouth daily.     nicotine polacrilex (NICORETTE) 2 MG gum Take 1 each (2 mg total) by mouth as needed for smoking cessation. 100 tablet 0   pantoprazole (PROTONIX) 40 MG tablet Take 1 tablet (40 mg total) by mouth daily. 30 tablet 0   rosuvastatin (CRESTOR) 20 MG tablet Take 1 tablet (20 mg total) by mouth daily. 30 tablet 2   No facility-administered medications prior to visit.     Review of Systems:   Constitutional: No weight loss or gain, night sweats, fevers, chills, fatigue, or lassitude. HEENT: No headaches, difficulty swallowing, tooth/dental problems, or sore throat. No sneezing, itching, ear ache, nasal congestion, or post nasal drip CV:  No chest pain, orthopnea, PND, swelling in lower extremities, anasarca, dizziness, palpitations, syncope Resp: +shortness of breath with exertion; occasional wheeze. No cough. No excess mucus or change in color of mucus. No hemoptysis. No chest wall deformity Skin: No rash, lesions, ulcerations MSK:  +occasional leg weakness with activity. No joint pain or swelling.  No decreased range of motion.  No back pain. Neuro: No dizziness or lightheadedness.  Psych: No depression or anxiety.  Mood stable.     Physical Exam:  There were no vitals taken for this visit.  GEN: Pleasant, interactive, chronically-ill appearing; obese; in no acute distress. HEENT:  Normocephalic and atraumatic. PERRLA. Sclera white. Nasal turbinates pink, moist and patent bilaterally. No rhinorrhea present. Oropharynx pink and moist, without exudate or edema. No lesions, ulcerations, or postnasal drip.  NECK:  Supple w/ fair ROM. No JVD present. Normal carotid impulses w/o bruits. Thyroid symmetrical with no goiter or nodules palpated. No lymphadenopathy.   CV: RRR, no m/r/g, no peripheral edema. Pulses intact, +2 bilaterally. No cyanosis, pallor or clubbing. PULMONARY:  Unlabored, regular breathing. Diminished bases bilaterally with minimal end expiratory wheezes A&P. No accessory muscle use. No dullness to percussion. GI: BS present and normoactive. Soft, non-tender to palpation.  MSK: No erythema, warmth or tenderness. Cap refil <2 sec all extrem. No deformities or joint swelling noted.  Neuro: A/Ox3. No focal deficits noted.   Skin: Warm, no lesions or rashe Psych: Normal affect and behavior. Judgement and thought content appropriate.  Lab Results:  CBC    Component Value Date/Time   WBC 9.8 01/18/2023 0215   RBC 4.45 01/18/2023 0215   HGB 13.4 01/18/2023 0215   HCT 41.4 01/18/2023 0215   PLT 135 (L) 01/18/2023 0215   MCV 93.0 01/18/2023 0215   MCH 30.1 01/18/2023 0215   MCHC 32.4 01/18/2023 0215   RDW 13.1 01/18/2023 0215   LYMPHSABS 0.6 (L) 01/12/2023 2258   MONOABS 0.0 (L) 01/12/2023 2258   EOSABS 0.0 01/12/2023 2258   BASOSABS 0.0 01/12/2023 2258    BMET    Component Value Date/Time   NA 138 01/18/2023 0215   K 4.1 01/18/2023 0215   CL 92 (L) 01/18/2023 0215   CO2 33 (H) 01/18/2023 0215   GLUCOSE 107 (H) 01/18/2023 0215   BUN 15 01/18/2023 0215   CREATININE 0.81 01/18/2023 0215   CALCIUM 9.2 01/18/2023 0215   GFRNONAA >60 01/18/2023 0215   GFRAA >60 07/06/2020 0513     BNP    Component Value Date/Time   BNP 241.7 (H) 01/17/2023 0641     Imaging:  No results found.        No data to display          No results found for: "NITRICOXIDE"      Assessment & Plan:   No problem-specific Assessment & Plan notes found for this encounter.    I spent 35 minutes of dedicated to the care of this patient on the date of this encounter to include pre-visit review of records, face-to-face time with the patient discussing conditions above, post visit ordering of testing, clinical documentation with the electronic health record, making appropriate referrals as documented, and communicating necessary findings to members of the patients care team.  Noemi Chapel, NP 03/03/2023  Pt aware and understands NP's role.

## 2023-03-06 NOTE — Progress Notes (Deleted)
[facility] [place]  ASSESSMENT / PLAN: [name] is a 77 y.o. female with atherosclerosis of native arteries of bilateral lower extremities causing atypical symptoms / claudication.  Patient counseled patients with asymptomatic peripheral arterial disease or claudication have a 1-2% risk of developing chronic limb threatening ischemia, but a 15-30% risk of mortality in the next 5 years. Intervention should only be considered for medically optimized patients with disabling symptoms.   Recommend the following which can slow the progression of atherosclerosis and reduce the risk of major adverse cardiac / limb events:  Complete cessation from all tobacco products. Blood glucose control with goal A1c < 7%. Blood pressure control with goal blood pressure < 140/90 mmHg. Lipid reduction therapy with goal LDL-C <100 mg/dL (<16 if symptomatic from PAD).  Aspirin 81mg  PO QD.  Daily walking to and past the point of discomfort.  Adequate hydration (at least 2 liters / day) if patient's heart and kidney function is adequate.  Patient is doing a good job trying to quit smoking.  I will see her again in 1 year.  We will repeat ABI at that time.  Patient was counseled of limb threatening symptoms.  She was encouraged to return to care should these develop.  CHIEF COMPLAINT: Peripheral arterial disease identified by podiatrist  HISTORY OF PRESENT ILLNESS: Deanna Petty is a 77 y.o. female who presents to clinic for evaluation of peripheral arterial disease.  The patient has been under the care of a podiatrist, who is concerned that she had significant peripheral arterial disease.  A arterial duplex was performed confirming significant disease in bilateral lower extremities.  She was referred for further care.  Patient was recently discharged from the hospital for COPD exacerbation and pneumonia.  She is thankfully recovering well from that.  She has stopped smoking, thankfully.   The patient reports weakness in her legs after some walking.  I suspect she does not walk fast enough to claudicate.  She does tire easily, needs to take frequent rests when walking through a big box store like [employer].  She does not describe symptoms typical of ischemic rest pain.  She has no ulcers about her feet  Past Medical History:  Diagnosis Date   Bronchitis    Cigarette smoker    COPD (chronic obstructive pulmonary disease) (HCC)    Pneumonia    Shortness of breath    with exertion; lasted used inhaler 06/02/13    Past Surgical History:  Procedure Laterality Date   ABDOMINAL HYSTERECTOMY     BREAST LUMPECTOMY WITH NEEDLE LOCALIZATION Left 06/12/2013   Procedure: LEFT BREAST LUMPECTOMY WITH NEEDLE LOCALIZATION;  Surgeon: Wilmon Arms. Corliss Skains, MD;  Location: [facility] OR;  Service: General;  Laterality: Left;   BREAST SURGERY     HIP FRACTURE SURGERY Right    LEFT HEART CATH AND CORONARY ANGIOGRAPHY N/A 01/17/2023   Procedure: LEFT HEART CATH AND CORONARY ANGIOGRAPHY;  Surgeon: Yvonne Kendall, MD;  Location: [facility];  Service: Cardiovascular;  Laterality: N/A;    Family History  Problem Relation Age of Onset   Cancer Mother        oral, brain   Diabetes Father     Social History   Socioeconomic History   Marital status: Single    Spouse name: Not on file   Number of children: Not on file   Years of education: Not on file   Highest education level: Not on file  Occupational History   Not  on file  Tobacco Use   Smoking status: Some Days    Packs/day: 0.25    Years: 40.00    Additional pack years: 0.00    Total pack years: 10.00    Types: Cigarettes   Smokeless tobacco: Never   Tobacco comments:    Smokes 4/day 10/26/22  Vaping Use   Vaping Use: Never used  Substance and Sexual Activity   Alcohol use: Yes    Comment: rare   Drug use: No   Sexual activity: Not on file  Other Topics Concern   Not on file  Social History Narrative   Not on file   Social  Determinants of Health   Financial Resource Strain: Not on file  Food Insecurity: No Food Insecurity (01/16/2023)   Hunger Vital Sign    Worried About Running Out of Food in the Last Year: Never true    Ran Out of Food in the Last Year: Never true  Transportation Needs: No Transportation Needs (01/16/2023)   PRAPARE - Administrator, Civil Service (Medical): No    Lack of Transportation (Non-Medical): No  Physical Activity: Not on file  Stress: Not on file  Social Connections: Not on file  Intimate Partner Violence: Not At Risk (01/16/2023)   Humiliation, Afraid, Rape, and Kick questionnaire    Fear of Current or Ex-Partner: No    Emotionally Abused: No    Physically Abused: No    Sexually Abused: No    No Known Allergies  Current Outpatient Medications  Medication Sig Dispense Refill   Acetaminophen (TYLENOL PO) Take 2 tablets by mouth daily as needed (pain).     albuterol (PROVENTIL) (2.5 MG/3ML) 0.083% nebulizer solution Take 3 mLs (2.5 mg total) by nebulization every 6 (six) hours as needed for shortness of breath or wheezing. 180 mL 0   amLODipine (NORVASC) 10 MG tablet Take 10 mg by mouth daily.     apixaban (ELIQUIS) 5 MG TABS tablet Take 1 tablet (5 mg total) by mouth 2 (two) times daily. 60 tablet 2   ASCORBIC ACID PO Take 1 tablet by mouth daily. Vitamin C, unknown strength     aspirin EC 81 MG tablet Take 1 tablet (81 mg total) by mouth daily. Swallow whole. 30 tablet 2   bisoprolol (ZEBETA) 5 MG tablet Take 1 tablet (5 mg total) by mouth daily. 30 tablet 2   Calcium Carb-Cholecalciferol (CALCIUM + VITAMIN D3 PO) Take 1 tablet by mouth daily.     dapagliflozin propanediol (FARXIGA) 10 MG TABS tablet Take 1 tablet (10 mg total) by mouth daily. 30 tablet 2   fluticasone (FLONASE) 50 MCG/ACT nasal spray Place 1-2 sprays into both nostrils daily as needed for allergies.     Fluticasone-Umeclidin-Vilant (TRELEGY ELLIPTA) 100-62.5-25 MCG/ACT AEPB Inhale 1 puff into the  lungs daily. 28 each 5   furosemide (LASIX) 20 MG tablet Take 1 tablet (20 mg total) by mouth daily as needed for edema or fluid. 30 tablet 1   losartan (COZAAR) 25 MG tablet Take 1 tablet (25 mg total) by mouth daily. 30 tablet 2   metFORMIN (GLUCOPHAGE) 1000 MG tablet Take 1,000 mg by mouth 2 (two) times daily.     Multiple Vitamin (MULTIVITAMIN) tablet Take 1 tablet by mouth daily.     nicotine polacrilex (NICORETTE) 2 MG gum Take 1 each (2 mg total) by mouth as needed for smoking cessation. 100 tablet 0   pantoprazole (PROTONIX) 40 MG tablet Take 1 tablet (40 mg  total) by mouth daily. 30 tablet 0   rosuvastatin (CRESTOR) 20 MG tablet Take 1 tablet (20 mg total) by mouth daily. 30 tablet 2   No current facility-administered medications for this visit.    PHYSICAL EXAM There were no vitals filed for this visit.   Constitutional: Chronically ill-appearing woman in no acute distress. Cardiac: regular rate and rhythm.  Respiratory:  unlabored. Abdominal:  soft, non-tender, non-distended.  Peripheral vascular: No palpable pedal pulses.  No ulcers about the foot, interdigital spaces, or heel.  PERTINENT LABORATORY AND RADIOLOGIC DATA  Most recent CBC    Latest Ref Rng & Units 01/18/2023    2:15 AM 01/17/2023    6:42 AM 01/16/2023    2:08 AM  CBC  WBC 4.0 - 10.5 K/uL 9.8  8.3  12.8   Hemoglobin 12.0 - 15.0 g/dL 16.1  09.6  04.5   Hematocrit 36.0 - 46.0 % 41.4  38.1  40.6   Platelets 150 - 400 K/uL 135  121  139      Most recent CMP    Latest Ref Rng & Units 01/18/2023    2:15 AM 01/17/2023    6:42 AM 01/16/2023    2:08 AM  CMP  Glucose 70 - 99 mg/dL 409  811  914   BUN 8 - 23 mg/dL 15  17  21    Creatinine 0.44 - 1.00 mg/dL 7.82  9.56  2.13   Sodium 135 - 145 mmol/L 138  141  141   Potassium 3.5 - 5.1 mmol/L 4.1  4.5  5.1   Chloride 98 - 111 mmol/L 92  96  94   CO2 22 - 32 mmol/L 33  36  39   Calcium 8.9 - 10.3 mg/dL 9.2  8.7  9.1     Renal function CrCl cannot be calculated  (Patient's most recent lab result is older than the maximum 21 days allowed.).  Hgb A1c MFr Bld (%)  Date Value  01/12/2023 7.4 (H)    LDL Cholesterol  Date Value Ref Range Status  01/13/2023 32 0 - 99 mg/dL Final    Comment:           Total Cholesterol/HDL:CHD Risk Coronary Heart Disease Risk Table                     Men   Women  1/2 Average Risk   3.4   3.3  Average Risk       5.0   4.4  2 X Average Risk   9.6   7.1  3 X Average Risk  23.4   11.0        Use the calculated Patient Ratio above and the CHD Risk Table to determine the patient's CHD Risk.        ATP III CLASSIFICATION (LDL):  <100     mg/dL   Optimal  086-578  mg/dL   Near or Above                    Optimal  130-159  mg/dL   Borderline  469-629  mg/dL   High  >528     mg/dL   Very High Performed at [facility], [address] [place], [place] [address]      +-------+-----------+-----------+------------+------------+  ABI/TBIToday's ABIToday's TBIPrevious ABIPrevious TBI  +-------+-----------+-----------+------------+------------+  Right  0.40       0.21                                 +-------+-----------+-----------+------------+------------+  Left   0.54       0.39                                 +-------+-----------+-----------+------------+------------+   Rande Brunt. Lenell Antu, MD [facility] Phone Number: [phone] 03/06/2023 7:25 AM  Total time spent on preparing this encounter including chart review, data review, collecting history, examining the patient, coordinating care for this new patient, 60 minutes.  Portions of this report may have been transcribed using voice recognition software.  Every effort has been made to ensure accuracy; however, inadvertent computerized transcription errors may still be present.

## 2023-03-07 ENCOUNTER — Ambulatory Visit: Payer: Medicare (Managed Care) | Admitting: Vascular Surgery

## 2023-03-07 ENCOUNTER — Ambulatory Visit (HOSPITAL_COMMUNITY): Payer: Medicare (Managed Care) | Attending: Vascular Surgery

## 2023-03-10 ENCOUNTER — Ambulatory Visit (HOSPITAL_COMMUNITY): Payer: Medicare (Managed Care)

## 2023-03-15 ENCOUNTER — Ambulatory Visit: Payer: Medicare (Managed Care) | Attending: Nurse Practitioner | Admitting: Nurse Practitioner

## 2023-03-15 NOTE — Progress Notes (Deleted)
Office Visit    Patient Name: Deanna Petty Date of Encounter: 03/15/2023  Primary Care Provider:  Loura Back, NP Primary Cardiologist:  None  Chief Complaint   77 year old female with a history of CAD, paroxysmal atrial fibrillation, chronic diastolic heart failure, aortic stenosis, PAD, hypertension, hyperlipidemia, type 2 diabetes, COPD, and tobacco use who presents for hospital follow-up related to CAD and atrial fibrillation.  Past Medical History    Past Medical History:  Diagnosis Date   Bronchitis    Cigarette smoker    COPD (chronic obstructive pulmonary disease) (HCC)    Pneumonia    Shortness of breath    with exertion; lasted used inhaler 06/02/13   Past Surgical History:  Procedure Laterality Date   ABDOMINAL HYSTERECTOMY     BREAST LUMPECTOMY WITH NEEDLE LOCALIZATION Left 06/12/2013   Procedure: LEFT BREAST LUMPECTOMY WITH NEEDLE LOCALIZATION;  Surgeon: Wilmon Arms. Corliss Skains, MD;  Location: [facility] OR;  Service: General;  Laterality: Left;   BREAST SURGERY     HIP FRACTURE SURGERY Right    LEFT HEART CATH AND CORONARY ANGIOGRAPHY N/A 01/17/2023   Procedure: LEFT HEART CATH AND CORONARY ANGIOGRAPHY;  Surgeon: Yvonne Kendall, MD;  Location: [facility] IN[facility];  Service: Cardiovascular;  Laterality: N/A;    Allergies  No Known Allergies   Labs/Other Studies Reviewed    The following studies were reviewed today:  Cardiac Studies & Procedures   CARDIAC CATHETERIZATION  CARDIAC CATHETERIZATION 01/17/2023  Narrative Conclusions: Mild to moderate coronary artery disease.  Most severe lesion is a 70% stenosis involving the distal LCx, which is too small for intervention.  Elevated troponin is most likely due to supply-demand mismatch (type 2 MI) in the setting of COPD exacerbation, acute HFpEF, and atrial fibrillation with rapid ventricular response. Mildly elevated left ventricular filling pressure (LVEDP 20 mmHg).  Recommendations: Medical therapy and risk factor  modification to prevent progression of coronary artery disease. Consider gentle diuresis.  Yvonne Kendall, MD [facility]  Findings Coronary Findings Diagnostic  Dominance: Right  Left Main Vessel is large. Vessel is angiographically normal.  Left Anterior Descending Vessel is large. Prox LAD to Mid LAD lesion is 45% stenosed. The lesion is eccentric. The lesion is moderately calcified. Mid LAD lesion is 40% stenosed.  First Diagonal Branch Vessel is moderate in size.  Ramus Intermedius Vessel is moderate in size.  Left Circumflex Vessel is moderate in size. Mid Cx lesion is 30% stenosed. Dist Cx lesion is 70% stenosed.  First Obtuse Marginal Branch Vessel is small in size.  Second Obtuse Marginal Branch Vessel is small in size.  Third Obtuse Marginal Branch Vessel is moderate in size.  Fourth Obtuse Marginal Branch Vessel is small in size.  Right Coronary Artery Vessel is large. Prox RCA lesion is 35% stenosed.  Right Posterior Descending Artery Vessel is moderate in size. RPDA lesion is 20% stenosed.  Right Posterior Atrioventricular Artery Vessel is moderate in size.  First Right Posterolateral Branch Vessel is small in size.  Second Right Posterolateral Branch Vessel is moderate in size.  Third Right Posterolateral Branch Vessel is small in size.  Intervention  No interventions have been documented.     ECHOCARDIOGRAM  ECHOCARDIOGRAM COMPLETE 01/13/2023  Narrative ECHOCARDIOGRAM REPORT    Patient Name:   Deanna Petty Date of Exam: 01/13/2023 Medical Rec #:  [id number]     Height:       62.0 in Accession #:    [id number]    Weight:  186.1 lb Date of Birth:  05/23/1946    BSA:          1.854 m Patient Age:    76 years      BP:           143/55 mmHg Patient Gender: F             HR:           73 bpm. Exam Location:  Inpatient  Procedure: 2D Echo, Cardiac Doppler, Color Doppler and Intracardiac Opacification  Agent  Indications:    elevated troponin  History:        Patient has prior history of Echocardiogram examinations. COPD.  Sonographer:    Mike Gip Referring Phys: [id number] ANASTASSIA DOUTOVA  IMPRESSIONS   1. Left ventricular ejection fraction, by estimation, is 60 to 65%. The left ventricle has normal function. The left ventricle has no regional wall motion abnormalities. There is mild concentric left ventricular hypertrophy. Left ventricular diastolic parameters are consistent with Grade I diastolic dysfunction (impaired relaxation). 2. Right ventricular systolic function is normal. The right ventricular size is normal. Tricuspid regurgitation signal is inadequate for assessing PA pressure. 3. No evidence of mitral valve regurgitation. 4. There is moderate calcification of the aortic valve. Aortic valve regurgitation is not visualized. Mild aortic valve stenosis. 5. The inferior vena cava is dilated in size with >50% respiratory variability, suggesting right atrial pressure of 8 mmHg.  FINDINGS Left Ventricle: Left ventricular ejection fraction, by estimation, is 60 to 65%. The left ventricle has normal function. The left ventricle has no regional wall motion abnormalities. Definity contrast agent was given IV to delineate the left ventricular endocardial borders. The left ventricular internal cavity size was normal in size. There is mild concentric left ventricular hypertrophy. Left ventricular diastolic parameters are consistent with Grade I diastolic dysfunction (impaired relaxation).  Right Ventricle: The right ventricular size is normal. Right ventricular systolic function is normal. Tricuspid regurgitation signal is inadequate for assessing PA pressure.  Left Atrium: Left atrial size was normal in size.  Right Atrium: Right atrial size was normal in size.  Pericardium: There is no evidence of pericardial effusion.  Mitral Valve: No evidence of mitral valve  regurgitation.  Tricuspid Valve: Tricuspid valve regurgitation is not demonstrated.  Aortic Valve: There is moderate calcification of the aortic valve. Aortic valve regurgitation is not visualized. Mild aortic stenosis is present. Aortic valve mean gradient measures 12.3 mmHg. Aortic valve peak gradient measures 19.0 mmHg. Aortic valve area, by VTI measures 1.26 cm.  Pulmonic Valve: Pulmonic valve regurgitation is not visualized.  Aorta: The aortic root and ascending aorta are structurally normal, with no evidence of dilitation.  Venous: The inferior vena cava is dilated in size with greater than 50% respiratory variability, suggesting right atrial pressure of 8 mmHg.  IAS/Shunts: No atrial level shunt detected by color flow Doppler.   LEFT VENTRICLE PLAX 2D LVIDd:         4.00 cm     Diastology LVIDs:         2.00 cm     LV e' medial:    6.09 cm/s LV PW:         1.30 cm     LV E/e' medial:  19.5 LV IVS:        1.20 cm     LV e' lateral:   8.70 cm/s LVOT diam:     1.80 cm     LV E/e' lateral: 13.7 LV SV:  67 LV SV Index:   36 LVOT Area:     2.54 cm  LV Volumes (MOD) LV vol d, MOD A2C: 88.4 ml LV vol d, MOD A4C: 82.3 ml LV vol s, MOD A2C: 22.6 ml LV vol s, MOD A4C: 19.1 ml LV SV MOD A2C:     65.8 ml LV SV MOD A4C:     82.3 ml LV SV MOD BP:      64.4 ml  RIGHT VENTRICLE             IVC RV Basal diam:  3.40 cm     IVC diam: 2.40 cm RV S prime:     11.50 cm/s TAPSE (M-mode): 1.5 cm  LEFT ATRIUM             Index        RIGHT ATRIUM           Index LA diam:        4.20 cm 2.27 cm/m   RA Area:     13.40 cm LA Vol (A2C):   54.1 ml 29.18 ml/m  RA Volume:   29.30 ml  15.80 ml/m LA Vol (A4C):   51.0 ml 27.51 ml/m LA Biplane Vol: 55.9 ml 30.15 ml/m AORTIC VALVE AV Area (Vmax):    1.25 cm AV Area (Vmean):   1.12 cm AV Area (VTI):     1.26 cm AV Vmax:           217.67 cm/s AV Vmean:          170.667 cm/s AV VTI:            0.531 m AV Peak Grad:      19.0  mmHg AV Mean Grad:      12.3 mmHg LVOT Vmax:         107.00 cm/s LVOT Vmean:        75.200 cm/s LVOT VTI:          0.263 m LVOT/AV VTI ratio: 0.49  AORTA Ao Root diam: 2.80 cm Ao Asc diam:  2.50 cm  MITRAL VALVE MV Area (PHT): 3.19 cm     SHUNTS MV Decel Time: 238 msec     Systemic VTI:  0.26 m MV E velocity: 119.00 cm/s  Systemic Diam: 1.80 cm MV A velocity: 140.00 cm/s MV E/A ratio:  0.85  Carolan Clines Electronically signed by Carolan Clines Signature Date/Time: 01/13/2023/11:25:37 AM    Final            Recent Labs: 01/12/2023: TSH 0.573 01/13/2023: ALT 26 01/17/2023: B Natriuretic Peptide 241.7 01/18/2023: BUN 15; Creatinine, Ser 0.81; Hemoglobin 13.4; Magnesium 1.8; Platelets 135; Potassium 4.1; Sodium 138  Recent Lipid Panel    Component Value Date/Time   CHOL 92 01/13/2023 0150   TRIG 46 01/13/2023 0150   HDL 51 01/13/2023 0150   CHOLHDL 1.8 01/13/2023 0150   VLDL 9 01/13/2023 0150   LDLCALC 32 01/13/2023 0150    History of Present Illness    77 year old female with the above past medical history including CAD, paroxysmal atrial fibrillation, chronic diastolic heart failure, aortic stenosis, PAD, hypertension, hyperlipidemia, type 2 diabetes, COPD, lung nodule, and tobacco use.  She was previously evaluated by Dr. Excell Seltzer in the setting of abnormal EKG prior to surgical intervention.  Echocardiogram was recommended that time but was not performed.  She has not been seen in follow-up since. She presented to the ED on 01/12/2023 in the setting of new onset atrial fibrillation with  RVR, type II MI, acute on chronic diastolic heart failure, COPD exacerbation.  Cardiology was consulted.  She converted to sinus rhythm following administration of IV diltiazem.  She was started on bisoprolol and Eliquis.  Troponin peaked at 6000.  EKG showed T wave inversion in inferolateral leads, not new.  Cardiac catheterization revealed mild to moderate CAD with 70% stenosis in the distal left  circumflex artery.  Echocardiogram showed EF 60 to 65%, normal LV systolic function, no RWMA, G1 DD, normal RV systolic function, mild aortic stenosis, mean gradient 12.3 mmHg.  Chest x-ray suggested CHF, BNP was slightly elevated.  She was started on IV Lasix and [place].  Pulmonology was also consulted.  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  Close follow-up as an outpatient was recommended.  She was also noted to have electrolyte abnormalities including hypokalemia and hypomagnesemia, improved with replenishment.  She was discharged home in stable condition on 01/18/2023.  She presents today for follow-up.  Since her hospitalization  Paroxysmal atrial fibrillation: CAD: Chronic diastolic heart failure: Aortic stenosis: Hypertension: Hyperlipidemia: Type 2 diabetes: COPD/tobacco use/lung nodule: Disposition:  Home Medications    Current Outpatient Medications  Medication Sig Dispense Refill   Acetaminophen (TYLENOL PO) Take 2 tablets by mouth daily as needed (pain).     albuterol (PROVENTIL) (2.5 MG/3ML) 0.083% nebulizer solution Take 3 mLs (2.5 mg total) by nebulization every 6 (six) hours as needed for shortness of breath or wheezing. 180 mL 0   amLODipine (NORVASC) 10 MG tablet Take 10 mg by mouth daily.     apixaban (ELIQUIS) 5 MG TABS tablet Take 1 tablet (5 mg total) by mouth 2 (two) times daily. 60 tablet 2   ASCORBIC ACID PO Take 1 tablet by mouth daily. Vitamin C, unknown strength     aspirin EC 81 MG tablet Take 1 tablet (81 mg total) by mouth daily. Swallow whole. 30 tablet 2   bisoprolol (ZEBETA) 5 MG tablet Take 1 tablet (5 mg total) by mouth daily. 30 tablet 2   Calcium Carb-Cholecalciferol (CALCIUM + VITAMIN D3 PO) Take 1 tablet by mouth daily.     dapagliflozin propanediol (FARXIGA) 10 MG TABS tablet Take 1 tablet (10 mg total) by mouth daily. 30 tablet 2   fluticasone (FLONASE) 50 MCG/ACT nasal  spray Place 1-2 sprays into both nostrils daily as needed for allergies.     Fluticasone-Umeclidin-Vilant (TRELEGY ELLIPTA) 100-62.5-25 MCG/ACT AEPB Inhale 1 puff into the lungs daily. 28 each 5   furosemide (LASIX) 20 MG tablet Take 1 tablet (20 mg total) by mouth daily as needed for edema or fluid. 30 tablet 1   losartan (COZAAR) 25 MG tablet Take 1 tablet (25 mg total) by mouth daily. 30 tablet 2   metFORMIN (GLUCOPHAGE) 1000 MG tablet Take 1,000 mg by mouth 2 (two) times daily.     Multiple Vitamin (MULTIVITAMIN) tablet Take 1 tablet by mouth daily.     nicotine polacrilex (NICORETTE) 2 MG gum Take 1 each (2 mg total) by mouth as needed for smoking cessation. 100 tablet 0   pantoprazole (PROTONIX) 40 MG tablet Take 1 tablet (40 mg total) by mouth daily. 30 tablet 0   rosuvastatin (CRESTOR) 20 MG tablet Take 1 tablet (20 mg total) by mouth daily. 30 tablet 2   No current facility-administered medications for this visit.     Review of Systems    ***.  All other systems reviewed and are  otherwise negative except as noted above.    Physical Exam    VS:  There were no vitals taken for this visit. , BMI There is no height or weight on file to calculate BMI.     GEN: Well nourished, well developed, in no acute distress. HEENT: normal. Neck: Supple, no JVD, carotid bruits, or masses. Cardiac: RRR, no murmurs, rubs, or gallops. No clubbing, cyanosis, edema.  Radials/DP/PT 2+ and equal bilaterally.  Respiratory:  Respirations regular and unlabored, clear to auscultation bilaterally. GI: Soft, nontender, nondistended, BS + x 4. MS: no deformity or atrophy. Skin: warm and dry, no rash. Neuro:  Strength and sensation are intact. Psych: Normal affect.  Accessory Clinical Findings    ECG personally reviewed by me today - *** - no acute changes.   Lab Results  Component Value Date   WBC 9.8 01/18/2023   HGB 13.4 01/18/2023   HCT 41.4 01/18/2023   MCV 93.0 01/18/2023   PLT 135 (L)  01/18/2023   Lab Results  Component Value Date   CREATININE 0.81 01/18/2023   BUN 15 01/18/2023   NA 138 01/18/2023   K 4.1 01/18/2023   CL 92 (L) 01/18/2023   CO2 33 (H) 01/18/2023   Lab Results  Component Value Date   ALT 26 01/13/2023   AST 47 (H) 01/13/2023   ALKPHOS 63 01/13/2023   BILITOT 0.5 01/13/2023   Lab Results  Component Value Date   CHOL 92 01/13/2023   HDL 51 01/13/2023   LDLCALC 32 01/13/2023   TRIG 46 01/13/2023   CHOLHDL 1.8 01/13/2023    Lab Results  Component Value Date   HGBA1C 7.4 (H) 01/12/2023    Assessment & Plan    1.  ***  No BP recorded.  {Refresh Note OR Click here to enter BP  :1}***   Joylene Grapes, NP 03/15/2023, 8:16 AM

## 2023-03-23 ENCOUNTER — Telehealth: Payer: Self-pay | Admitting: Primary Care

## 2023-03-23 NOTE — Telephone Encounter (Signed)
[facility] Called in to get info about Pet scan they are the new PCP for pt and wants to know hat it is being done

## 2023-03-28 NOTE — Telephone Encounter (Signed)
Patient is supposed to be scheduled with her primary pulmonologist Dr. Isaiah Serge who approved the PET scan order.  For her PCP, please fax consult note on 01/13/23 (Author: NP Minor with attestation by me), CTA 01/12/23 report and also this note:  Pulmonary was consulted while patient was admitted for COPD exacerbation, AFRVR and NSTEMI.  CTA demonstrated interval enlargement of LUL nodule from 4mm to 1.5 x 1.8 cm diameter compared to 09/18/21 CTA. Significant interval enlargement and spiculation concerning for malignancy.   PET scan ordered after patient stable and discharged.

## 2023-03-28 NOTE — Telephone Encounter (Signed)
Dr. Everardo All, [facility] has called to schedule pt a pet scan because they are her PCP, her insurance and as well as her transportation. Once this has been completed they will fax over those results

## 2023-03-29 NOTE — Telephone Encounter (Signed)
Lisa-can you please check why the PET scan was not completed and reorder if necessary.  Can you also make a follow-up visit with me as soon as possible.

## 2023-03-30 NOTE — Telephone Encounter (Addendum)
I rescheduled PET and ov with PM.  Called [facility] and spoke to [place].  She states there was confusion somewhere - PET needed to be cancelled due to they were getting it scheduled for her at [facility] and she states pt didn't need to cancel previous appt with [place].  I have cancelled PET and they are going to send PM the PET report.  I gave her new appt with PM.  She states they will talk to pt and give her new info.  Nothing further needed.

## 2023-03-30 NOTE — Telephone Encounter (Signed)
Patient was scheduled PET scan 03/31/23 at 1000, but patient cancelled appointment. I called and spoke with patient who said she was told by [facility] to cancel PET appointment for 03/31/23 and have it rescheduled. Patient is aware I am having PET rescheduled and she will need a follow up OV scheduled after with Dr. Isaiah Serge.  Patient stated her appointments have to be arranged wit [facility] [phone], because [facility] is handling her transportation.  Message routed to [facility] for PET and follow up scheduling

## 2023-03-31 ENCOUNTER — Ambulatory Visit (HOSPITAL_COMMUNITY): Payer: Medicare (Managed Care)

## 2023-04-19 ENCOUNTER — Telehealth (HOSPITAL_BASED_OUTPATIENT_CLINIC_OR_DEPARTMENT_OTHER): Payer: Self-pay

## 2023-04-21 ENCOUNTER — Other Ambulatory Visit (HOSPITAL_COMMUNITY): Payer: No Typology Code available for payment source

## 2023-04-26 ENCOUNTER — Ambulatory Visit: Payer: Medicare (Managed Care) | Admitting: Pulmonary Disease

## 2023-05-03 ENCOUNTER — Ambulatory Visit (INDEPENDENT_AMBULATORY_CARE_PROVIDER_SITE_OTHER): Payer: No Typology Code available for payment source | Admitting: Pulmonary Disease

## 2023-05-03 ENCOUNTER — Encounter: Payer: Self-pay | Admitting: Pulmonary Disease

## 2023-05-03 VITALS — BP 144/60 | HR 99 | Temp 98.5°F | Ht 62.0 in | Wt 173.6 lb

## 2023-05-03 DIAGNOSIS — R911 Solitary pulmonary nodule: Secondary | ICD-10-CM | POA: Diagnosis not present

## 2023-05-03 NOTE — Patient Instructions (Signed)
Will make referral to cardiology for evaluation of your heart disease and clearance for bronchoscopy I will discuss with my partners about possible lung biopsy after cardiology evaluation Return to clinic in 2 to 4 weeks with Dr. Delton Coombes.

## 2023-05-03 NOTE — Progress Notes (Signed)
Deanna Petty    [id number]    1946-01-17  Primary Care Physician:Thomas, Turner Daniels, MD  Referring Physician: Loura Back, NP [address] [place],  [place] [address]  Chief complaint:  Follow up for COPD exacerbation, hypercarbic respiratory failure  HPI: 77 y.o.  heavy smoker admitted in September with hypoxic, hypercarbic respiratory failure in the setting of COPD exacerbation. PCCM consulted for hypoxic, hypercarbic respiratory failure and need for trilogy vent at home Assessment at that time was that she needs to get over her acute exacerbation and reassess in office after optimal treatment of COPD  She had been told that she had COPD but no PFTs are on record.  She had been on inhalers but ran out of medications around 2018 and never refilled them.  Pets: No pets Occupation: Retired Hospital doctor Exposures: No known exposures.  No mold, hot tub, Jacuzzi.  No feather pillows or comforters Smoking history: Greater than 100-pack-year smoker.   Travel history: No significant travel history Relevant family history: No significant family history of lung disease.  Interim history: Continues on Trelegy inhaler.  Chantix prescribed at last visit but she is still smoking Here for review of CT and PET scan  Outpatient Encounter Medications as of 05/03/2023  Medication Sig   Acetaminophen (TYLENOL PO) Take 2 tablets by mouth daily as needed (pain).   albuterol (PROVENTIL) (2.5 MG/3ML) 0.083% nebulizer solution Take 3 mLs (2.5 mg total) by nebulization every 6 (six) hours as needed for shortness of breath or wheezing.   amLODipine (NORVASC) 10 MG tablet Take 10 mg by mouth daily.   apixaban (ELIQUIS) 5 MG TABS tablet Take 1 tablet (5 mg total) by mouth 2 (two) times daily.   ASCORBIC ACID PO Take 1 tablet by mouth daily. Vitamin C, unknown strength   aspirin EC 81 MG tablet Take 1 tablet (81 mg total) by mouth daily. Swallow whole.   bisoprolol (ZEBETA) 5 MG tablet Take 1 tablet (5  mg total) by mouth daily.   Calcium Carb-Cholecalciferol (CALCIUM + VITAMIN D3 PO) Take 1 tablet by mouth daily.   dapagliflozin propanediol (FARXIGA) 10 MG TABS tablet Take 1 tablet (10 mg total) by mouth daily.   fluticasone (FLONASE) 50 MCG/ACT nasal spray Place 1-2 sprays into both nostrils daily as needed for allergies.   Fluticasone-Umeclidin-Vilant (TRELEGY ELLIPTA) 100-62.5-25 MCG/ACT AEPB Inhale 1 puff into the lungs daily.   furosemide (LASIX) 20 MG tablet Take 1 tablet (20 mg total) by mouth daily as needed for edema or fluid.   losartan (COZAAR) 25 MG tablet Take 1 tablet (25 mg total) by mouth daily.   metFORMIN (GLUCOPHAGE) 1000 MG tablet Take 1,000 mg by mouth 2 (two) times daily.   Multiple Vitamin (MULTIVITAMIN) tablet Take 1 tablet by mouth daily.   nicotine polacrilex (NICORETTE) 2 MG gum Take 1 each (2 mg total) by mouth as needed for smoking cessation.   pantoprazole (PROTONIX) 40 MG tablet Take 1 tablet (40 mg total) by mouth daily.   rosuvastatin (CRESTOR) 20 MG tablet Take 1 tablet (20 mg total) by mouth daily.   No facility-administered encounter medications on file as of 05/03/2023.    Physical Exam: Blood pressure (!) 140/58, pulse 78, temperature 97.7 F (36.5 C), temperature source Oral, height 5\' 2"  (1.575 m), weight 186 lb (84.4 kg), SpO2 94 %. Gen:      No acute distress HEENT:  EOMI, sclera anicteric Neck:     No masses; no  thyromegaly Lungs:    Clear to auscultation bilaterally; normal respiratory effort CV:         Regular rate and rhythm; no murmurs Abd:      + bowel sounds; soft, non-tender; no palpable masses, no distension Ext:    No edema; adequate peripheral perfusion Skin:      Warm and dry; no rash Neuro: alert and oriented x 3 Psych: normal mood and affect   Data Reviewed: Imaging: Chest x-ray 07/03/20-hazy interstitial lower lung opacities.  CTA 07/03/20-no PE, minimal lower lobe atelectasis. CTA 09/18/2021-lower lobe bronchial thickening, no  pulmonary embolism CT scan 01/12/2043-enlarging spiculated nodule in the left PET scan 04/12/2023-hypermetabolic lesion in the left lower lobe. I have reviewed the images personally  PFTs:  Labs: CBC 01/18/2022-WBC 10.8, eos 0% Alpha-1 antitrypsin 08/05/2020-147, PI MM  Sleep: Home sleep study 09/08/2020-mild OSA, AHI 6.1, desats to 79%   Cardiac: Echo 07/04/20 LVEF 60-65%, mild LVH grade 1 diastolic dysfunction.  RV systolic function is normal  Assessment:  COPD Continue Trelegy inhaler Schedule PFTs for evaluation of COPD  Lung nodule Suspicious for lung cancer as it is hypermetabolic on PET scan. We will make referral to cardiology for evaluation of your heart disease and clearance for bronchoscopy I will discuss with my partners about possible lung biopsy after cardiology evaluation  Active smoker Discussed smoking cessation in detail with patient She wants to quit on her own.  Time spent counseling-5 minutes Reassess at return visit. Referral for low-dose screening CT has already been placed but not completed yet.  We will repeat order.  Plan/Recommendations: Work up for lung nodule and bronchoscopy Continue Trelegy inhaler PFTs Smoking cessation  Chilton Greathouse MD [facility] 05/03/2023, 11:53 AM  CC: Loura Back, NP

## 2023-05-05 NOTE — Telephone Encounter (Signed)
Pt needs to be set up with a nodule consult with Dr. Delton Coombes. RN tried several times to reach out to [facility] to arrange appointment but was unsuccessful. Front desk please schedule pt in first available nodule slot with RB and notify pt.

## 2023-05-19 ENCOUNTER — Ambulatory Visit: Payer: Medicare (Managed Care) | Admitting: Primary Care

## 2023-05-23 ENCOUNTER — Other Ambulatory Visit: Payer: Self-pay | Admitting: *Deleted

## 2023-05-23 ENCOUNTER — Telehealth: Payer: Self-pay | Admitting: Pulmonary Disease

## 2023-05-23 DIAGNOSIS — R911 Solitary pulmonary nodule: Secondary | ICD-10-CM

## 2023-05-23 DIAGNOSIS — Z01818 Encounter for other preprocedural examination: Secondary | ICD-10-CM

## 2023-05-23 NOTE — Telephone Encounter (Signed)
Chilton Greathouse, MD  [address] [address] [place], Rosezella [place], LPN [place],  This patient has cancelled the last appointment. Can you please make a referral to Dr. Herbie Baltimore, cardiology for clearance and schedule with Dr. Delton Coombes as next available nodule clinic opening to evaluate for bronchoscopy. Thanks  [facility]    Cardiology referral has been placed for clearance. I called and spoke with patient.  Patient stated all scheduling must go through [facility] [phone]. I called [facility] to set up Dr. Delton Coombes consult and spoke with Lawson Fiscal.  Lori requested office note from Dr. Isaiah Serge with his recommendations and scheduling needs for authorization. Lawson Fiscal stated once they received authorization Lupita Leash will call office to schedule and arrange appointment.   Last office note 05/03/23 from Dr. Isaiah Serge has plan and instructions for follow ups.  LOV note instructions faxed to [facility] [phone].  Confirmation received.  Will leave message open for scheduling follow up.

## 2023-05-23 NOTE — Progress Notes (Signed)
Deanna Greathouse, MD  [address]; [place], Rosezella [place], LPN [place],  This patient has cancelled the last appointment. Can you please make a referral to Dr. Herbie Baltimore, cardiology for clearance and schedule with Dr. Delton Coombes as next available nodule clinic opening to evaluate for bronchoscopy. Thanks  [facility]    Cardiology referral has been placed for clearance. I called and spoke with patient.  Patient stated all scheduling must go through [facility] [phone]. I called [facility] to set up Dr. Delton Coombes consult and spoke with Deanna Petty.  Deanna Petty requested office note from Dr. Isaiah Serge with his recommendations and scheduling needs for authorization. Deanna Petty stated once they received authorization Deanna Petty will call office to schedule and arrange appointment.   Last office note 05/03/23 from Dr. Isaiah Serge has plan and instructions for follow ups.  LOV note instructions faxed to [facility] [phone].  Confirmation received.

## 2023-06-05 NOTE — Progress Notes (Signed)
 " Cardiology Office Note:  .   Date:  06/07/2023  ID:  Deanna, Petty 06/27/1946, MRN [id number] PCP: Debby Laveda NOVAK, MD  [facility] Providers Cardiologist:  None    History of Present Illness: Deanna   MUNACHIMSO Petty is a 77 y.o. female with a past medical history of atrial fibrillation, COPD on oxygen 3 L, moderate aortic stenosis, DM 2, tobacco abuse, hypertension, hyperlipidemia, obesity.  01/17/2023 left heart cath mild to moderate CAD, distal LCx 70% stenosed 01/13/2023 echo EF 60 to 65%, mild concentric LVH, grade 1 DD, moderate calcification of the aortic valve with mild aortic stenosis  Admitted to [facility] 01/12/2023 for COPD exacerbation and new onset A-fib RVR, non-STEMI. Her electrolytes were depleted, she was in respiratory acidosis troponins peaked at 6000, she converted to sinus rhythm after given Cardizem  push.  TTE without significant findings, CHA2DS2-VASc score was 5, she was started on Eliquis  and Zebeta .  She underwent a left heart cath on 01/17/23 which revealed mild to moderate CAD, distal LCx 70% stenosis--too small for intervention.   She presents today for follow-up of her CAD, some confusion surrounding her visit today, she thought she was coming for an echo and then advised that pulmonology needed her cleared for an upcoming bronchoscopy.  We never received a formal request for this.  She is part of [facility], goes there daily, sees a provider there, works with PT twice/week. She denies chest pain, palpitations, dyspnea, pnd, orthopnea, n, v, dizziness, syncope, edema, weight gain, or early satiety.   ROS: Review of Systems  Constitutional: Negative.   HENT: Negative.    Eyes: Negative.   Respiratory:  Positive for shortness of breath.   Cardiovascular: Negative.   Gastrointestinal: Negative.   Genitourinary: Negative.   Musculoskeletal: Negative.   Skin: Negative.   Neurological: Negative.   Endo/Heme/Allergies:  Bruises/bleeds easily.   Psychiatric/Behavioral: Negative.       Studies Reviewed: .         Risk Assessment/Calculations:    CHA2DS2-VASc Score = 6   This indicates a 9.7% annual risk of stroke. The patient's score is based upon: CHF History: 0 HTN History: 1 Diabetes History: 1 Stroke History: 0 Vascular Disease History: 1 Age Score: 2 Gender Score: 1            Physical Exam:   VS:  BP 120/60 (BP Location: Right Arm, Patient Position: Sitting, Cuff Size: Normal)   Pulse 77   Ht 5' 2 (1.575 m)   Wt 174 lb (78.9 kg)   SpO2 94%   BMI 31.83 kg/m    Wt Readings from Last 3 Encounters:  06/06/23 174 lb (78.9 kg)  05/03/23 173 lb 9.6 oz (78.7 kg)  01/18/23 182 lb 1.6 oz (82.6 kg)    GEN: Well nourished, well developed in no acute distress NECK: No JVD; No carotid bruits CARDIAC: RRR, no murmurs, rubs, gallops RESPIRATORY:  Clear to auscultation without rales, wheezing or rhonchi  ABDOMEN: Soft, non-tender, non-distended EXTREMITIES:  No edema; No deformity   ASSESSMENT AND PLAN: .    Paroxysmal atrial fibrillation/hypercoagulable state-she is in sinus rhythm today, CHA2DS2-VASc score of 6, continue Eliquis  5 mg twice daily--no indication for dose reduction, continue Zebeta  5 mg daily. Coronary artery disease-mild to moderate CAD, distal LCx 70% stenosed per left heart cath in 2024.  Continue aspirin  81 mg daily, continue Zebeta  5 mg daily, continue Crestor  20 mg daily. Stable with no anginal symptoms. No indication for  ischemic evaluation.   Hyperlipidemia-most recent LDL well-controlled at 32 on 01/13/2023, continue Crestor  20 mg daily. Hypertension-blood pressure today is well-controlled at 120/60, continue Norvasc  10 mg daily, continue losartan  25 mg daily. Tobacco abuse-current smoker, not interested in stopping, she is oxygen dependent and states she does not smoke cigarettes when she is wearing her oxygen. Aortic stenosis-mild per most recent echo earlier this year COPD-O2 dependent,  followed by Dr. Theophilus in [facility]    Dispo: CBC, BMET.  Follow-up in 4 months with Dr. Liborio.  Signed, Delon JAYSON Hoover, NP  "

## 2023-06-06 ENCOUNTER — Ambulatory Visit: Payer: No Typology Code available for payment source | Attending: Cardiology | Admitting: Cardiology

## 2023-06-06 ENCOUNTER — Encounter: Payer: Self-pay | Admitting: Cardiology

## 2023-06-06 VITALS — BP 120/60 | HR 77 | Ht 62.0 in | Wt 174.0 lb

## 2023-06-06 DIAGNOSIS — I480 Paroxysmal atrial fibrillation: Secondary | ICD-10-CM | POA: Diagnosis not present

## 2023-06-06 DIAGNOSIS — I350 Nonrheumatic aortic (valve) stenosis: Secondary | ICD-10-CM | POA: Diagnosis not present

## 2023-06-06 DIAGNOSIS — E782 Mixed hyperlipidemia: Secondary | ICD-10-CM

## 2023-06-06 DIAGNOSIS — I10 Essential (primary) hypertension: Secondary | ICD-10-CM

## 2023-06-06 DIAGNOSIS — D6859 Other primary thrombophilia: Secondary | ICD-10-CM

## 2023-06-06 DIAGNOSIS — J449 Chronic obstructive pulmonary disease, unspecified: Secondary | ICD-10-CM

## 2023-06-06 NOTE — Patient Instructions (Signed)
Medication Instructions:  Your physician recommends that you continue on your current medications as directed. Please refer to the Current Medication list given to you today.  *If you need a refill on your cardiac medications before your next appointment, please call your pharmacy*   Lab Work: CBC, BMP- today If you have labs (blood work) drawn today and your tests are completely normal, you will receive your results only by: MyChart Message (if you have MyChart) OR A paper copy in the mail If you have any lab test that is abnormal or we need to change your treatment, we will call you to review the results.   Testing/Procedures: None Ordered   Follow-Up: At [facility], you and your health needs are our priority.  As part of our continuing mission to provide you with exceptional heart care, we have created designated Provider Care Teams.  These Care Teams include your primary Cardiologist (physician) and Advanced Practice Providers (APPs -  Physician Assistants and Nurse Practitioners) who all work together to provide you with the care you need, when you need it.  We recommend signing up for the patient portal called "MyChart".  Sign up information is provided on this After Visit Summary.  MyChart is used to connect with patients for Virtual Visits (Telemedicine).  Patients are able to view lab/test results, encounter notes, upcoming appointments, etc.  Non-urgent messages can be sent to your provider as well.   To learn more about what you can do with MyChart, go to [email or web address].    Your next appointment:   4 month(s)- with Dr. Vincent Gros  The format for your next appointment:   In Person  Provider:   Wallis Bamberg, NP   Other Instructions NA

## 2023-06-07 LAB — BASIC METABOLIC PANEL WITH GFR
BUN/Creatinine Ratio: 9 — ABNORMAL LOW (ref 12–28)
BUN: 8 mg/dL (ref 8–27)
CO2: 25 mmol/L (ref 20–29)
Calcium: 10.8 mg/dL — ABNORMAL HIGH (ref 8.7–10.3)
Chloride: 100 mmol/L (ref 96–106)
Creatinine, Ser: 0.92 mg/dL (ref 0.57–1.00)
Glucose: 109 mg/dL — ABNORMAL HIGH (ref 70–99)
Potassium: 4.8 mmol/L (ref 3.5–5.2)
Sodium: 142 mmol/L (ref 134–144)
eGFR: 65 mL/min/1.73

## 2023-06-07 LAB — CBC
Hematocrit: 46.6 % (ref 34.0–46.6)
Hemoglobin: 14.7 g/dL (ref 11.1–15.9)
MCH: 29.1 pg (ref 26.6–33.0)
MCHC: 31.5 g/dL (ref 31.5–35.7)
MCV: 92 fL (ref 79–97)
Platelets: 214 x10E3/uL (ref 150–450)
RBC: 5.06 x10E6/uL (ref 3.77–5.28)
RDW: 13.2 % (ref 11.7–15.4)
WBC: 12.7 x10E3/uL — ABNORMAL HIGH (ref 3.4–10.8)

## 2023-06-08 ENCOUNTER — Telehealth: Payer: Self-pay | Admitting: Pulmonary Disease

## 2023-06-08 NOTE — Telephone Encounter (Signed)
[facility] fax'd in this PT's PFT results. I put in Dr. Myrlene Broker Box and notes appt notyes.

## 2023-06-08 NOTE — Telephone Encounter (Addendum)
Can you please follow up on scheduling this patient with Dr. Delton Coombes and [facility] for abnormal PET scan.  Call [facility] at [phone] to arrange transporration

## 2023-06-14 ENCOUNTER — Telehealth: Payer: Self-pay | Admitting: Cardiology

## 2023-06-14 NOTE — Telephone Encounter (Signed)
Left vm to call back

## 2023-06-14 NOTE — Telephone Encounter (Signed)
Deanna Petty from [facility] is requesting a callback regarding her being seen in the office on 06/06/2023. Please advise

## 2023-06-15 NOTE — Telephone Encounter (Signed)
Lvm to call back

## 2023-06-15 NOTE — Telephone Encounter (Signed)
Deanna Petty states that the pt was sent to our office for clearance of a bronchoscopy. Advised that a clearance form has not been received. Deanna Petty states she will reach out to [employer].

## 2023-06-22 ENCOUNTER — Ambulatory Visit: Payer: No Typology Code available for payment source | Admitting: Pulmonary Disease

## 2023-06-22 NOTE — Progress Notes (Deleted)
Synopsis: Referred in September 2024 for abnormal CT chest by Doreene Eland, MD  Subjective:   PATIENT ID: Deanna Petty GENDER: female DOB: Jun 04, 1946, MRN: [id number]  No chief complaint on file.   This is a 77 year old female, past medical history of COPD, tobacco abuse.  Patient has smoked for 40+ years. Patient has been followed in our clinic since 2021.  Patient had first a CT imaging of the chest in September 2021 followed by CT imaging of the chest in December 2022 and ultimately repeat CT imaging in December 2023 and again repeat CT imaging in March 2024.  The nodule was first noted on CT imaging in December 2022 measuring 6.2 mm.  There is no documentation of the nodule in the report from December 2022.  Patient was seen in the office in January 2024 and CT imaging from December 2023 that showed enlargement of the pulmonary nodule was not discussed.   Oncology History   No history exists.    Past Medical History:  Diagnosis Date  . Bronchitis   . Cigarette smoker   . COPD (chronic obstructive pulmonary disease) (HCC)   . Pneumonia   . Shortness of breath    with exertion; lasted used inhaler 06/02/13     Family History  Problem Relation Age of Onset  . Cancer Mother        oral, brain  . Diabetes Father      Past Surgical History:  Procedure Laterality Date  . ABDOMINAL HYSTERECTOMY    . BREAST LUMPECTOMY WITH NEEDLE LOCALIZATION Left 06/12/2013   Procedure: LEFT BREAST LUMPECTOMY WITH NEEDLE LOCALIZATION;  Surgeon: Wilmon Arms. Corliss Skains, MD;  Location: [facility] OR;  Service: General;  Laterality: Left;  . BREAST SURGERY    . HIP FRACTURE SURGERY Right   . LEFT HEART CATH AND CORONARY ANGIOGRAPHY N/A 01/17/2023   Procedure: LEFT HEART CATH AND CORONARY ANGIOGRAPHY;  Surgeon: Yvonne Kendall, MD;  Location: [facility]IVE [facility];  Service: Cardiovascular;  Laterality: N/A;    Social History   Socioeconomic History  . Marital status: Single    Spouse name: Not on file   . Number of children: Not on file  . Years of education: Not on file  . Highest education level: Not on file  Occupational History  . Not on file  Tobacco Use  . Smoking status: Every Day    Current packs/day: 0.25    Average packs/day: 0.3 packs/day for 40.0 years (10.0 ttl pk-yrs)    Types: Cigarettes  . Smokeless tobacco: Never  . Tobacco comments:    Smokes 4/day 05/03/23  Vaping Use  . Vaping status: Never Used  Substance and Sexual Activity  . Alcohol use: Yes    Comment: rare  . Drug use: No  . Sexual activity: Not on file  Other Topics Concern  . Not on file  Social History Narrative  . Not on file   Social Determinants of Health   Financial Resource Strain: Not on file  Food Insecurity: No Food Insecurity (01/16/2023)   Hunger Vital Sign   . Worried About Programme researcher, broadcasting/film/video in the Last Year: Never true   . Ran Out of Food in the Last Year: Never true  Transportation Needs: No Transportation Needs (01/16/2023)   PRAPARE - Transportation   . Lack of Transportation (Medical): No   . Lack of Transportation (Non-Medical): No  Physical Activity: Not on file  Stress: Not on file  Social Connections: Not on file  Intimate Partner Violence: Not At Risk (01/16/2023)   Humiliation, Afraid, Rape, and Kick questionnaire   . Fear of Current or Ex-Partner: No   . Emotionally Abused: No   . Physically Abused: No   . Sexually Abused: No     No Known Allergies   Outpatient Medications Prior to Visit  Medication Sig Dispense Refill  . Acetaminophen (TYLENOL PO) Take 2 tablets by mouth daily as needed (pain).    Marland Kitchen albuterol (VENTOLIN HFA) 108 (90 Base) MCG/ACT inhaler Inhale 2 puffs into the lungs every 6 (six) hours as needed for wheezing or shortness of breath.    Marland Kitchen amLODipine (NORVASC) 10 MG tablet Take 10 mg by mouth daily.    Marland Kitchen apixaban (ELIQUIS) 5 MG TABS tablet Take 1 tablet (5 mg total) by mouth 2 (two) times daily. 60 tablet 2  . ASCORBIC ACID PO Take 1 tablet by  mouth daily. Vitamin C, unknown strength    . aspirin EC 81 MG tablet Take 1 tablet (81 mg total) by mouth daily. Swallow whole. 30 tablet 2  . bisoprolol (ZEBETA) 5 MG tablet Take 1 tablet (5 mg total) by mouth daily. 30 tablet 2  . Calcium Carb-Cholecalciferol (CALCIUM + VITAMIN D3 PO) Take 1 tablet by mouth daily.    . dapagliflozin propanediol (FARXIGA) 10 MG TABS tablet Take 1 tablet (10 mg total) by mouth daily. 30 tablet 2  . fluticasone (FLONASE) 50 MCG/ACT nasal spray Place 1-2 sprays into both nostrils daily as needed for allergies.    . Fluticasone-Umeclidin-Vilant (TRELEGY ELLIPTA) 100-62.5-25 MCG/ACT AEPB Inhale 1 puff into the lungs daily. 28 each 5  . furosemide (LASIX) 20 MG tablet Take 20 mg by mouth daily.    Marland Kitchen losartan (COZAAR) 25 MG tablet Take 1 tablet (25 mg total) by mouth daily. 30 tablet 2  . metFORMIN (GLUCOPHAGE) 1000 MG tablet Take 1,000 mg by mouth 2 (two) times daily.    . Multiple Vitamin (MULTIVITAMIN) tablet Take 1 tablet by mouth daily.    . nicotine polacrilex (NICORETTE) 2 MG gum Take 1 each (2 mg total) by mouth as needed for smoking cessation. 100 tablet 0  . pantoprazole (PROTONIX) 40 MG tablet Take 1 tablet (40 mg total) by mouth daily. 30 tablet 0  . rosuvastatin (CRESTOR) 20 MG tablet Take 1 tablet (20 mg total) by mouth daily. 30 tablet 2   No facility-administered medications prior to visit.    ROS   Objective:  Physical Exam   There were no vitals filed for this visit.   on *** LPM *** RA BMI Readings from Last 3 Encounters:  06/06/23 31.83 kg/m  05/03/23 31.75 kg/m  01/18/23 33.31 kg/m   Wt Readings from Last 3 Encounters:  06/06/23 174 lb (78.9 kg)  05/03/23 173 lb 9.6 oz (78.7 kg)  01/18/23 182 lb 1.6 oz (82.6 kg)     CBC    Component Value Date/Time   WBC 12.7 (H) 06/06/2023 1159   WBC 9.8 01/18/2023 0215   RBC 5.06 06/06/2023 1159   RBC 4.45 01/18/2023 0215   HGB 14.7 06/06/2023 1159   HCT 46.6 06/06/2023 1159   PLT  214 06/06/2023 1159   MCV 92 06/06/2023 1159   MCH 29.1 06/06/2023 1159   MCH 30.1 01/18/2023 0215   MCHC 31.5 06/06/2023 1159   MCHC 32.4 01/18/2023 0215   RDW 13.2 06/06/2023 1159   LYMPHSABS 0.6 (L) 01/12/2023 2258   MONOABS 0.0 (L) 01/12/2023 2258   EOSABS 0.0  01/12/2023 2258   BASOSABS 0.0 01/12/2023 2258    ***  Chest Imaging: ***  Pulmonary Functions Testing Results:     No data to display          FeNO: ***  Pathology: ***  Echocardiogram: ***  Heart Catheterization: ***    Assessment & Plan:   No diagnosis found.  Discussion: ***   Current Outpatient Medications:  .  Acetaminophen (TYLENOL PO), Take 2 tablets by mouth daily as needed (pain)., Disp: , Rfl:  .  albuterol (VENTOLIN HFA) 108 (90 Base) MCG/ACT inhaler, Inhale 2 puffs into the lungs every 6 (six) hours as needed for wheezing or shortness of breath., Disp: , Rfl:  .  amLODipine (NORVASC) 10 MG tablet, Take 10 mg by mouth daily., Disp: , Rfl:  .  apixaban (ELIQUIS) 5 MG TABS tablet, Take 1 tablet (5 mg total) by mouth 2 (two) times daily., Disp: 60 tablet, Rfl: 2 .  ASCORBIC ACID PO, Take 1 tablet by mouth daily. Vitamin C, unknown strength, Disp: , Rfl:  .  aspirin EC 81 MG tablet, Take 1 tablet (81 mg total) by mouth daily. Swallow whole., Disp: 30 tablet, Rfl: 2 .  bisoprolol (ZEBETA) 5 MG tablet, Take 1 tablet (5 mg total) by mouth daily., Disp: 30 tablet, Rfl: 2 .  Calcium Carb-Cholecalciferol (CALCIUM + VITAMIN D3 PO), Take 1 tablet by mouth daily., Disp: , Rfl:  .  dapagliflozin propanediol (FARXIGA) 10 MG TABS tablet, Take 1 tablet (10 mg total) by mouth daily., Disp: 30 tablet, Rfl: 2 .  fluticasone (FLONASE) 50 MCG/ACT nasal spray, Place 1-2 sprays into both nostrils daily as needed for allergies., Disp: , Rfl:  .  Fluticasone-Umeclidin-Vilant (TRELEGY ELLIPTA) 100-62.5-25 MCG/ACT AEPB, Inhale 1 puff into the lungs daily., Disp: 28 each, Rfl: 5 .  furosemide (LASIX) 20 MG tablet, Take  20 mg by mouth daily., Disp: , Rfl:  .  losartan (COZAAR) 25 MG tablet, Take 1 tablet (25 mg total) by mouth daily., Disp: 30 tablet, Rfl: 2 .  metFORMIN (GLUCOPHAGE) 1000 MG tablet, Take 1,000 mg by mouth 2 (two) times daily., Disp: , Rfl:  .  Multiple Vitamin (MULTIVITAMIN) tablet, Take 1 tablet by mouth daily., Disp: , Rfl:  .  nicotine polacrilex (NICORETTE) 2 MG gum, Take 1 each (2 mg total) by mouth as needed for smoking cessation., Disp: 100 tablet, Rfl: 0 .  pantoprazole (PROTONIX) 40 MG tablet, Take 1 tablet (40 mg total) by mouth daily., Disp: 30 tablet, Rfl: 0 .  rosuvastatin (CRESTOR) 20 MG tablet, Take 1 tablet (20 mg total) by mouth daily., Disp: 30 tablet, Rfl: 2  I spent *** minutes dedicated to the care of this patient on the date of this encounter to include pre-visit review of records, face-to-face time with the patient discussing conditions above, post visit ordering of testing, clinical documentation with the electronic health record, making appropriate referrals as documented, and communicating necessary findings to members of the patients care team.   Josephine Igo, DO [facility] 06/22/2023 8:04 AM

## 2023-07-03 ENCOUNTER — Encounter: Payer: Self-pay | Admitting: Pulmonary Disease

## 2023-08-17 ENCOUNTER — Encounter (HOSPITAL_BASED_OUTPATIENT_CLINIC_OR_DEPARTMENT_OTHER): Payer: No Typology Code available for payment source

## 2023-08-17 ENCOUNTER — Encounter: Payer: Self-pay | Admitting: Primary Care

## 2023-08-17 ENCOUNTER — Ambulatory Visit (INDEPENDENT_AMBULATORY_CARE_PROVIDER_SITE_OTHER): Payer: No Typology Code available for payment source | Admitting: Primary Care

## 2023-08-17 ENCOUNTER — Ambulatory Visit (INDEPENDENT_AMBULATORY_CARE_PROVIDER_SITE_OTHER): Payer: No Typology Code available for payment source

## 2023-08-17 VITALS — BP 140/62 | HR 92 | Temp 98.7°F | Ht 62.0 in | Wt 175.4 lb

## 2023-08-17 DIAGNOSIS — Z720 Tobacco use: Secondary | ICD-10-CM

## 2023-08-17 DIAGNOSIS — J441 Chronic obstructive pulmonary disease with (acute) exacerbation: Secondary | ICD-10-CM | POA: Diagnosis not present

## 2023-08-17 DIAGNOSIS — R911 Solitary pulmonary nodule: Secondary | ICD-10-CM

## 2023-08-17 DIAGNOSIS — R918 Other nonspecific abnormal finding of lung field: Secondary | ICD-10-CM | POA: Insufficient documentation

## 2023-08-17 DIAGNOSIS — R9389 Abnormal findings on diagnostic imaging of other specified body structures: Secondary | ICD-10-CM | POA: Insufficient documentation

## 2023-08-17 DIAGNOSIS — J9611 Chronic respiratory failure with hypoxia: Secondary | ICD-10-CM

## 2023-08-17 HISTORY — DX: Solitary pulmonary nodule: R91.1

## 2023-08-17 HISTORY — DX: Other nonspecific abnormal finding of lung field: R91.8

## 2023-08-17 MED ORDER — AMOXICILLIN-POT CLAVULANATE 875-125 MG PO TABS: 1.0000 | ORAL_TABLET | Freq: Two times a day (BID) | ORAL | 0 refills | Status: DC

## 2023-08-17 NOTE — Assessment & Plan Note (Addendum)
-   Acute exacerbation of patient's underlying moderate COPD. She has had a congested cough with yellow mucus x 3 weeks. No increased shortness of breath. Lungs were clear on exam, no overt wheezing. Sending in RX Augmentin 1 tab twice daily x 10 days. Continue Trelegy one puff daily and prednisone taper until completed. Advised patient take mucinex 1200mg  once daily. CXR is pending.

## 2023-08-17 NOTE — Assessment & Plan Note (Addendum)
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 PET scan ordered in April but has been cancelled/no showed twice. She had an apt with Dr. Tonia Brooms that was cancelled/no showed, this needs to be reschedule to discuss lung nodule follow-up

## 2023-08-17 NOTE — Assessment & Plan Note (Signed)
-   Stable; No increased oxygen needs. O2 92% RA today/ uses POC @3l  - Continue 3L supplemental O2 to maintain >88-90%

## 2023-08-17 NOTE — Progress Notes (Signed)
@Patient  ID: Deanna Petty, female    DOB: [birth year], 77 y.o.   MRN: [id number]  Chief Complaint  Patient presents with   Follow-up    C/o no energy, cough-yellow,clear. Wheezing, denies fever.     Referring provider: Doreene Eland, MD  HPI: 77 y.o.  heavy smoker admitted in September with hypoxic, hypercarbic respiratory failure in the setting of COPD exacerbation. PCCM consulted for hypoxic, hypercarbic respiratory failure and need for trilogy vent at home Assessment at that time was that she needs to get over her acute exacerbation and reassess in office after optimal treatment of COPD  She had been told that she had COPD but no PFTs are on record.  She had been on inhalers but ran out of medications around 2018 and never refilled them.  Pets: No pets Occupation: Retired Hospital doctor Exposures: No known exposures.  No mold, hot tub, Jacuzzi.  No feather pillows or comforters Smoking history: Greater than 100-pack-year smoker.   Travel history: No significant travel history Relevant family history: No significant family history of lung disease.  Interim history: Continues on Trelegy inhaler.  Chantix prescribed at last visit but she is still smoking Here for review of CT and PET scan   08/17/2023 Patient comes in today for regular follow-up. Lives at home with roommates, attends day program twice a week at [facility]. She has had a productive/congested cough with yellow-white mucus for 3 weeks. No breathing issues. She was [facility]iod [facility] at [facility]. Home health checked her for covid/flu which were negative. She as given allergy medication. Today is her second day of prednisone. She brought oxygen in with her today but was not wearing, O2 92% RA when walking back to exam room. She normally wears 3L POC. She is still smoking. She is currently on prednisone.  She had pulmonary function testing at [facility] in August 2024 that showed moderate obstructive lung  disease. She is maintained on Trelegy Ellipta 100 mcg 1 puff daily and prn Albuterol. Denies fever, chills, sweats, increased sob, chest tightness, wheezing or hemoptysis.    Pulmonary function testing 05/31/2023 PFT>> FVC 1.63 (64%), FEV1 1.17 (61%), ratio 71 Moderate obstructive defect  No Known Allergies  Immunization History  Administered Date(s) Administered   Fluad Quad(high Dose 65+) 07/05/2020   Moderna Sars-Covid-2 Vaccination 03/27/2020, 04/24/2020   Pneumococcal Polysaccharide-23 07/07/2020    Past Medical History:  Diagnosis Date   Bronchitis    Cigarette smoker    COPD (chronic obstructive pulmonary disease) (HCC)    Pneumonia    Shortness of breath    with exertion; lasted used inhaler 06/02/13    Tobacco History: Social History   Tobacco Use  Smoking Status Every Day   Current packs/day: 0.25   Average packs/day: 0.3 packs/day for 40.0 years (10.0 ttl pk-yrs)   Types: Cigarettes  Smokeless Tobacco Never  Tobacco Comments   Smokes 4/day 05/03/23   Ready to quit: Not Answered Counseling given: Not Answered Tobacco comments: Smokes 4/day 05/03/23   Outpatient Medications Prior to Visit  Medication Sig Dispense Refill   Acetaminophen (TYLENOL PO) Take 2 tablets by mouth daily as needed (pain).     albuterol (VENTOLIN HFA) 108 (90 Base) MCG/ACT inhaler Inhale 2 puffs into the lungs every 6 (six) hours as needed for wheezing or shortness of breath.     amLODipine (NORVASC) 10 MG tablet Take 10 mg by mouth daily.     apixaban (ELIQUIS) 5 MG TABS tablet Take 1 tablet (  5 mg total) by mouth 2 (two) times daily. 60 tablet 2   ASCORBIC ACID PO Take 1 tablet by mouth daily. Vitamin C, unknown strength     aspirin EC 81 MG tablet Take 1 tablet (81 mg total) by mouth daily. Swallow whole. 30 tablet 2   bisoprolol (ZEBETA) 5 MG tablet Take 1 tablet (5 mg total) by mouth daily. 30 tablet 2   Calcium Carb-Cholecalciferol (CALCIUM + VITAMIN D3 PO) Take 1 tablet by mouth  daily.     dapagliflozin propanediol (FARXIGA) 10 MG TABS tablet Take 1 tablet (10 mg total) by mouth daily. 30 tablet 2   fluticasone (FLONASE) 50 MCG/ACT nasal spray Place 1-2 sprays into both nostrils daily as needed for allergies.     Fluticasone-Umeclidin-Vilant (TRELEGY ELLIPTA) 100-62.5-25 MCG/ACT AEPB Inhale 1 puff into the lungs daily. 28 each 5   furosemide (LASIX) 20 MG tablet Take 20 mg by mouth daily.     losartan (COZAAR) 25 MG tablet Take 1 tablet (25 mg total) by mouth daily. 30 tablet 2   metFORMIN (GLUCOPHAGE) 1000 MG tablet Take 1,000 mg by mouth 2 (two) times daily.     Multiple Vitamin (MULTIVITAMIN) tablet Take 1 tablet by mouth daily.     predniSONE (DELTASONE) 10 MG tablet Take 10 mg by mouth. Taper     rosuvastatin (CRESTOR) 20 MG tablet Take 1 tablet (20 mg total) by mouth daily. 30 tablet 2   nicotine polacrilex (NICORETTE) 2 MG gum Take 1 each (2 mg total) by mouth as needed for smoking cessation. (Patient not taking: Reported on 08/17/2023) 100 tablet 0   pantoprazole (PROTONIX) 40 MG tablet Take 1 tablet (40 mg total) by mouth daily. (Patient not taking: Reported on 08/17/2023) 30 tablet 0   No facility-administered medications prior to visit.   Review of Systems  Review of Systems  Constitutional:  Positive for fatigue.  HENT:  Positive for congestion.   Respiratory:  Positive for cough. Negative for shortness of breath and wheezing.   Cardiovascular: Negative.     Physical Exam  BP (!) 140/62 (BP Location: Left Arm, Cuff Size: Normal)   Pulse 92   Temp 98.7 F (37.1 C) (Temporal)   Ht 5\' 2"  (1.575 m)   Wt 175 lb 6.4 oz (79.6 kg)   SpO2 92%   BMI 32.08 kg/m  Physical Exam Constitutional:      General: She is not in acute distress.    Appearance: Normal appearance. She is not ill-appearing.  HENT:     Head: Normocephalic and atraumatic.  Cardiovascular:     Rate and Rhythm: Normal rate and regular rhythm.  Pulmonary:     Effort: Pulmonary  effort is normal.     Breath sounds: Normal breath sounds. No wheezing or rhonchi.     Comments: Congested cough  Neurological:     General: No focal deficit present.     Mental Status: She is alert and oriented to person, place, and time. Mental status is at baseline.  Psychiatric:        Mood and Affect: Mood normal.        Behavior: Behavior normal.        Thought Content: Thought content normal.        Judgment: Judgment normal.      Lab Results:  CBC    Component Value Date/Time   WBC 12.7 (H) 06/06/2023 1159   WBC 9.8 01/18/2023 0215   RBC 5.06 06/06/2023 1159   RBC  4.45 01/18/2023 0215   HGB 14.7 06/06/2023 1159   HCT 46.6 06/06/2023 1159   PLT 214 06/06/2023 1159   MCV 92 06/06/2023 1159   MCH 29.1 06/06/2023 1159   MCH 30.1 01/18/2023 0215   MCHC 31.5 06/06/2023 1159   MCHC 32.4 01/18/2023 0215   RDW 13.2 06/06/2023 1159   LYMPHSABS 0.6 (L) 01/12/2023 2258   MONOABS 0.0 (L) 01/12/2023 2258   EOSABS 0.0 01/12/2023 2258   BASOSABS 0.0 01/12/2023 2258    BMET    Component Value Date/Time   NA 142 06/06/2023 1159   K 4.8 06/06/2023 1159   CL 100 06/06/2023 1159   CO2 25 06/06/2023 1159   GLUCOSE 109 (H) 06/06/2023 1159   GLUCOSE 107 (H) 01/18/2023 0215   BUN 8 06/06/2023 1159   CREATININE 0.92 06/06/2023 1159   CALCIUM 10.8 (H) 06/06/2023 1159   GFRNONAA >60 01/18/2023 0215   GFRAA >60 07/06/2020 0513    BNP    Component Value Date/Time   BNP 241.7 (H) 01/17/2023 0641    ProBNP No results found for: "PROBNP"  Imaging: No results found.   Assessment & Plan:   COPD exacerbation (HCC) - Acute exacerbation of patient's underlying moderate COPD. She has had a congested cough with yellow mucus x 3 weeks. No increased shortness of breath. Lungs were clear on exam, no overt wheezing. Sending in RX Augmentin 1 tab twice daily x 10 days. Continue Trelegy one puff daily and prednisone taper until completed. Advised patient take mucinex 1200mg   once daily. CXR is pending.   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 PET scan ordered in April but has been cancelled/no showed twice. She had an apt with Dr. Tonia Brooms that was cancelled/no showed, this needs to be reschedule to discuss lung nodule follow-up   Chronic respiratory failure with hypoxia (HCC) - Stable; No increased oxygen needs. O2 92% RA today/ uses POC @3l  - Continue 3L supplemental O2 to maintain >88-90%    Recommendations: - Continue Allergy medication and flonase - Take regular mucinex 1,200mg  in the morning; Ok to take nyquil at night  - Continue prednisone taper until finished - Given prescription for Augmentin twice daily x 10 days (we will fax with [facility], please make sure you start this) - Continue Trelegy one puff daily (rinse mouth after use) - Use Albuterol 2 puffs every 4-6 hours as needed for breakthrough shortness of breath/wheezing - I will follow up on lung biopsy with Dr. Tonia Brooms   Follow-up: - First available with Dr. Markus Daft, NP 08/17/2023

## 2023-08-17 NOTE — Telephone Encounter (Signed)
As of right now, the next lung nodule consult is for 12/10 with Dr Delton Coombes. Is the patient okay to wait that long or would you prefer to see her on one of your November dates, Dr Isaiah Serge?

## 2023-08-17 NOTE — Patient Instructions (Addendum)
Lungs sounded ok, no wheezing  Sending in antibiotic for bronchitis  Awaiting CXR results   Recommendations: - Continue Allergy medication and flonase - Take regular mucinex 1,200mg  in the morning; Ok to take nyquil at night  - Continue prednisone taper until finished - Given prescription for Augmentin twice daily x 10 days (we will fax with [facility], please make sure you start this) - Continue Trelegy one puff daily (rinse mouth after use) - Use Albuterol 2 puffs every 4-6 hours as needed for breakthrough shortness of breath/wheezing - I will follow up on lung biopsy with Dr. Tonia Brooms   Follow-up: - First available with Dr. Isaiah Serge

## 2023-08-17 NOTE — Telephone Encounter (Addendum)
Seen today for acute sick visit. We need to reschedule patient with either Dr. Delton Coombes or [facility] for lung nodule follow-up, she had cancelled her visit with them

## 2023-08-17 NOTE — Telephone Encounter (Signed)
Thanks. She has cancelled multiple appointments in the past and will also need transportation arranged.

## 2023-08-18 NOTE — Progress Notes (Signed)
Let patient know chest x-ray showed no acute process, she has a nodular density left midlung which corresponds to previous PET scan.  She needs to schedule follow-up with Dr. Tonia Brooms, please see telephone note.

## 2023-08-21 ENCOUNTER — Telehealth: Payer: Self-pay | Admitting: Pulmonary Disease

## 2023-08-21 NOTE — Telephone Encounter (Signed)
Lori from [facility] called due to Beth's notes staying PET scan was not done. Lawson Fiscal states patient had a PET scan at [facility] and is faxing the report again.

## 2023-08-22 NOTE — Telephone Encounter (Signed)
PET scan received and placed in Beths box. Please place in scan after reviewing

## 2023-08-22 NOTE — Telephone Encounter (Signed)
Yes the 15 min appt is ok

## 2023-08-22 NOTE — Telephone Encounter (Signed)
The lung nodule slot available on 12/10 is only a slot.  Dr. Delton Coombes, is that okay?

## 2023-08-22 NOTE — Telephone Encounter (Signed)
Care home called to make FU appt for PT. Last AVS notes said to sched 1st avail w/Dr. Isaiah Serge.  Set appt as New Pt with [facility] in January. Please advise if this was incorredct based on this tel encounter. Thanks.

## 2023-08-22 NOTE — Telephone Encounter (Signed)
Spoke to Humphreys with [facility]. Appt scheduled for 09/26/2023 at 9:45. Transportation does not start until 10:30, however Lupita Leash is going to see if a family member can bring patient to appt.  Will leave 11/13/23 appt on the books just in case 12/10 does not work. Nothing further needed at this time.

## 2023-08-22 NOTE — Telephone Encounter (Signed)
That will have to do if we do not have any sooner appointments. Can you please call [facility] at [phone] to arrange transportation

## 2023-08-28 ENCOUNTER — Telehealth: Payer: Self-pay | Admitting: Primary Care

## 2023-08-28 NOTE — Telephone Encounter (Signed)
Patient cancelled or no showed several follow-up apts in July, August and September.   Received PET CT results from [facility].  PET scan 04/16/2023 showed hypermetabolic nodule within the superior segment of left lower lobe measuring 1.8 x 1.8 with SUV max 11.22.  On the examination from 01/12/2023 this measured 1.7 x 1.7 cm.  Patient has an apt with Dr. Delton Coombes in December. Can either one of you see her sooner to discuss need for bronchoscopy. She is on 3L oxygen at baseline, has moderate COPD.

## 2023-08-31 NOTE — Telephone Encounter (Signed)
Routing to [place] to make aware.

## 2023-09-01 NOTE — Telephone Encounter (Signed)
Addressed in prior phone note - ok to try to get her into or blocked slot for RB if any exist.

## 2023-09-01 NOTE — Telephone Encounter (Signed)
I send Dr. Delton Coombes and Dr. Tonia Brooms a telephone message to see if they can see her sooner or get her in for bronchoscopy. There were several canceled visits/no shows

## 2023-09-01 NOTE — Telephone Encounter (Signed)
She is scheduled to see me on 12/12.  Please try to get the PET rescheduled and done before that visit. Thank you

## 2023-09-01 NOTE — Telephone Encounter (Signed)
Patient scheduled 12/12 with RB.

## 2023-09-26 ENCOUNTER — Encounter: Payer: Self-pay | Admitting: Pulmonary Disease

## 2023-09-28 ENCOUNTER — Ambulatory Visit: Payer: No Typology Code available for payment source | Admitting: Emergency Medicine

## 2023-10-03 DIAGNOSIS — I739 Peripheral vascular disease, unspecified: Secondary | ICD-10-CM

## 2023-10-03 DIAGNOSIS — Z1211 Encounter for screening for malignant neoplasm of colon: Secondary | ICD-10-CM

## 2023-10-03 DIAGNOSIS — M810 Age-related osteoporosis without current pathological fracture: Secondary | ICD-10-CM | POA: Insufficient documentation

## 2023-10-03 DIAGNOSIS — E1151 Type 2 diabetes mellitus with diabetic peripheral angiopathy without gangrene: Secondary | ICD-10-CM | POA: Insufficient documentation

## 2023-10-03 DIAGNOSIS — M25551 Pain in right hip: Secondary | ICD-10-CM

## 2023-10-03 HISTORY — DX: Pain in right hip: M25.551

## 2023-10-03 HISTORY — DX: Peripheral vascular disease, unspecified: I73.9

## 2023-10-03 HISTORY — DX: Encounter for screening for malignant neoplasm of colon: Z12.11

## 2023-10-03 HISTORY — DX: Age-related osteoporosis without current pathological fracture: M81.0

## 2023-10-05 ENCOUNTER — Ambulatory Visit: Payer: No Typology Code available for payment source

## 2023-10-17 ENCOUNTER — Other Ambulatory Visit: Payer: Self-pay

## 2023-10-24 DIAGNOSIS — R0602 Shortness of breath: Secondary | ICD-10-CM | POA: Insufficient documentation

## 2023-10-24 DIAGNOSIS — J40 Bronchitis, not specified as acute or chronic: Secondary | ICD-10-CM | POA: Insufficient documentation

## 2023-10-24 DIAGNOSIS — F17210 Nicotine dependence, cigarettes, uncomplicated: Secondary | ICD-10-CM | POA: Insufficient documentation

## 2023-10-26 ENCOUNTER — Ambulatory Visit: Payer: No Typology Code available for payment source

## 2023-10-26 VITALS — BP 124/42 | HR 88 | Ht 64.0 in | Wt 192.0 lb

## 2023-10-26 DIAGNOSIS — I5032 Chronic diastolic (congestive) heart failure: Secondary | ICD-10-CM | POA: Diagnosis not present

## 2023-10-26 DIAGNOSIS — J40 Bronchitis, not specified as acute or chronic: Secondary | ICD-10-CM

## 2023-10-26 DIAGNOSIS — I10 Essential (primary) hypertension: Secondary | ICD-10-CM

## 2023-10-26 DIAGNOSIS — I350 Nonrheumatic aortic (valve) stenosis: Secondary | ICD-10-CM | POA: Insufficient documentation

## 2023-10-26 DIAGNOSIS — I2510 Atherosclerotic heart disease of native coronary artery without angina pectoris: Secondary | ICD-10-CM | POA: Insufficient documentation

## 2023-10-26 DIAGNOSIS — R0602 Shortness of breath: Secondary | ICD-10-CM

## 2023-10-26 DIAGNOSIS — I480 Paroxysmal atrial fibrillation: Secondary | ICD-10-CM

## 2023-10-26 DIAGNOSIS — F17210 Nicotine dependence, cigarettes, uncomplicated: Secondary | ICD-10-CM

## 2023-10-26 HISTORY — DX: Nonrheumatic aortic (valve) stenosis: I35.0

## 2023-10-26 HISTORY — DX: Atherosclerotic heart disease of native coronary artery without angina pectoris: I25.10

## 2023-10-26 NOTE — Assessment & Plan Note (Signed)
 Continue anticoagulation with Eliquis 5 mg twice daily. Appears to be in sinus rhythm with regular rate. EKG not performed at this visit.

## 2023-10-26 NOTE — Patient Instructions (Signed)
 Medication Instructions:  Your physician recommends that you continue on your current medications as directed. Please refer to the Current Medication list given to you today.  *If you need a refill on your cardiac medications before your next appointment, please call your pharmacy*   Lab Work: None Ordered If you have labs (blood work) drawn today and your tests are completely normal, you will receive your results only by: MyChart Message (if you have MyChart) OR A paper copy in the mail If you have any lab test that is abnormal or we need to change your treatment, we will call you to review the results.   Testing/Procedures:  Before your next office visit, you will have an echo done.  Echocardiogram An echocardiogram is a test that uses sound waves (ultrasound) to produce images of the heart. Images from an echocardiogram can provide important information about: Heart size and shape. The size and thickness and movement of your heart's walls. Heart muscle function and strength. Heart valve function or if you have stenosis. Stenosis is when the heart valves are too narrow. If blood is flowing backward through the heart valves (regurgitation). A tumor or infectious growth around the heart valves. Areas of heart muscle that are not working well because of poor blood flow or injury from a heart attack. Aneurysm detection. An aneurysm is a weak or damaged part of an artery wall. The wall bulges out from the normal force of blood pumping through the body. Tell a health care provider about: Any allergies you have. All medicines you are taking, including vitamins, herbs, eye drops, creams, and over-the-counter medicines. Any blood disorders you have. Any surgeries you have had. Any medical conditions you have. Whether you are pregnant or may be pregnant. What are the risks? Generally, this is a safe test. However, problems may occur, including an allergic reaction to dye (contrast) that  may be used during the test. What happens before the test? No specific preparation is needed. You may eat and drink normally. What happens during the test?  You will take off your clothes from the waist up and put on a hospital gown. Electrodes or electrocardiogram (ECG)patches may be placed on your chest. The electrodes or patches are then connected to a device that monitors your heart rate and rhythm. You will lie down on a table for an ultrasound exam. A gel will be applied to your chest to help sound waves pass through your skin. A handheld device, called a transducer, will be pressed against your chest and moved over your heart. The transducer produces sound waves that travel to your heart and bounce back (or echo back) to the transducer. These sound waves will be captured in real-time and changed into images of your heart that can be viewed on a video monitor. The images will be recorded on a computer and reviewed by your health care provider. You may be asked to change positions or hold your breath for a short time. This makes it easier to get different views or better views of your heart. In some cases, you may receive contrast through an IV in one of your veins. This can improve the quality of the pictures from your heart. The procedure may vary among health care providers and hospitals. What can I expect after the test? You may return to your normal, everyday life, including diet, activities, and medicines, unless your health care provider tells you not to do that. Follow these instructions at home: It is up to  you to get the results of your test. Ask your health care provider, or the department that is doing the test, when your results will be ready. Keep all follow-up visits. This is important. Summary An echocardiogram is a test that uses sound waves (ultrasound) to produce images of the heart. Images from an echocardiogram can provide important information about the size and shape  of your heart, heart muscle function, heart valve function, and other possible heart problems. You do not need to do anything to prepare before this test. You may eat and drink normally. After the echocardiogram is completed, you may return to your normal, everyday life, unless your health care provider tells you not to do that. This information is not intended to replace advice given to you by your health care provider. Make sure you discuss any questions you have with your health care provider. Document Revised: 06/16/2021 Document Reviewed: 05/26/2020 Elsevier Patient Education  2023 Elsevier Inc.        Follow-Up: At [facility], you and your health needs are our priority.  As part of our continuing mission to provide you with exceptional heart care, we have created designated Provider Care Teams.  These Care Teams include your primary Cardiologist (physician) and Advanced Practice Providers (APPs -  Physician Assistants and Nurse Practitioners) who all work together to provide you with the care you need, when you need it.  We recommend signing up for the patient portal called MyChart.  Sign up information is provided on this After Visit Summary.  MyChart is used to connect with patients for Virtual Visits (Telemedicine).  Patients are able to view lab/test results, encounter notes, upcoming appointments, etc.  Non-urgent messages can be sent to your provider as well.   To learn more about what you can do with MyChart, go to [email or web address].    Your next appointment:   6 month follow up

## 2023-10-26 NOTE — Assessment & Plan Note (Signed)
 Appears compensated, mildly hypervolemic based on bilateral lower extremity 1+ ankle edema. Last echocardiogram March 2024 with mild LVH and normal EF, normal RV function.  Educated about salt and fluid restriction to below 2 g/day and below 2 L/day respectively. Continue with furosemide  20 mg once daily. Continue with Farxiga  and losartan  as above.

## 2023-10-26 NOTE — Assessment & Plan Note (Signed)
 Proceed with repeat echocardiogram prior to next follow-up visit in 6 months for interval change assessment.

## 2023-10-26 NOTE — Assessment & Plan Note (Signed)
 Well-controlled on current regimen with amlodipine 10 mg once daily, bisoprolol 5 mg once daily, losartan 75 mg once daily. Target below 130 over 80 mmHg.

## 2023-10-26 NOTE — Assessment & Plan Note (Signed)
 Encouraged to quit smoking.

## 2023-10-26 NOTE — Assessment & Plan Note (Signed)
 From cardiac standpoint while she continues on Eliquis  not needed for her to be on aspirin .  However there is mention about peripheral vascular disease and she has been started on aspirin  for this.  Will defer this to PCP and vascular team with regards to further aspirin  necessity.  Continue rosuvastatin  5 mg once daily.

## 2023-10-26 NOTE — Progress Notes (Addendum)
 Cardiology Consultation:    Date:  10/26/2023   ID:  Deanna, Petty [birth year], MRN [id number]  PCP:  Deanna Laveda NOVAK, MD  Cardiologist:  Deanna SAUNDERS Birdie Fetty, MD   Referring MD: Deanna Laveda NOVAK, MD   No chief complaint on file.    ASSESSMENT AND PLAN:   [name] 78 year old with history of moderate nonobstructive coronary artery disease with distal small LCx 70% stenosis being medically treated at [facility] last April 2024], mild to moderate aortic stenosis, paroxysmal atrial fibrillation and remains on anticoagulation with Eliquis , COPD on 3 L home O2, diabetes mellitus, tobacco use, hypertension, hyperlipidemia, obesity, limited baseline functional status and poor dietary salt restriction presents for follow-up visit.  Problem List Items Addressed This Visit     Chronic diastolic CHF (congestive heart failure) (HCC)   Appears compensated, mildly hypervolemic based on bilateral lower extremity 1+ ankle edema. Last echocardiogram March 2024 with mild LVH and normal EF, normal RV function.  Educated about salt and fluid restriction to below 2 g/day and below 2 L/day respectively. Continue with furosemide  20 mg once daily. Continue with Farxiga  and losartan  as above.       Essential hypertension (Chronic)   Well-controlled on current regimen with amlodipine  10 mg once daily, bisoprolol  5 mg once daily, losartan  75 mg once daily. Target below 130 over 80 mmHg.       Paroxysmal atrial fibrillation [facility]), diagnosed March 2024, CHADS2 Vascor 6, on anticoagulation with Eliquis    Continue anticoagulation with Eliquis  5 mg twice daily. Appears to be in sinus rhythm with regular rate. EKG not performed at this visit.         Cigarette smoker - Primary   Encouraged to quit smoking.      CAD (coronary artery disease), last April 2024, distal LCx 70% stenosis small vessel for intervention, on medical therapy   From cardiac standpoint while she continues on Eliquis  not  needed for her to be on aspirin .  However there is mention about peripheral vascular disease and she has been started on aspirin  for this.  Will defer this to PCP and vascular team with regards to further aspirin  necessity.  Continue rosuvastatin  5 mg once daily.       Aortic stenosis, mild to moderate based on prior echocardiogram from March 2024.   Proceed with repeat echocardiogram prior to next follow-up visit in 6 months for interval change assessment.       Return to clinic in 6 months or as needed.   History of Present Illness:    [name] is a 79 y.o. female who is being seen today for follow-up visit. Last visit with also in the office with Delon Hoover, NP on 06-06-2023. PCP is Deanna Laveda NOVAK, MD.   Pleasant woman here for the visit by herself.  Mentions she lives with [name] who is her son's ex-wife.  She also follows up with [facility] facility that helps her with physical therapy and helps manage her medications.  Has history of CAD mild to moderate nonobstructive coronary artery disease with distal small LCx 70% stenosis  on cath April 2024 being medically treated, atrial fibrillation, COPD on 3 L home O2, mild to moderate aortic stenosis, diabetes mellitus, tobacco use, hypertension, hyperlipidemia, obesity.  Last symptomatic A-fib episode was March 2024 where she spontaneously converted after IV Cardizem  push.  CHADS2 Vascor elevated 6, remains on anticoagulation with Eliquis  5 mg twice daily.  Mentions overall she feels she is at her baseline.  Mentions she gets out of breath with minimal exertion.  Uses a cane to ambulate.  Outside she uses wheelchairs.  Continues to be on home O2 at baseline. Continues to smoke.  Admits that she is not diligent about salt restriction and food and enjoys eating salty foods.  Does notice bilateral lower extremity edema that is worse towards the end of the day.  She sleeps with her feet elevated at night and sees relief from  pedal edema in the morning.  Denies any palpitations, syncopal episode or near syncopal episodes.  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/2023   Bronchitis    Chronic respiratory failure with hypoxia (HCC) 05/24/2022   Cigarette smoker    COPD (chronic obstructive pulmonary disease) (HCC)    COPD exacerbation (HCC) 01/15/2022   COPD with acute exacerbation (HCC) 01/12/2023   Elevated brain natriuretic peptide (BNP) level 07/03/2020   Essential hypertension 01/15/2022   Hypercoagulable state due to paroxysmal atrial fibrillation (HCC) 01/18/2023   Hypokalemia 01/12/2023   Hypomagnesemia 01/16/2022   Left breast mass 05/14/2013   Lung nodule 08/17/2023   Myocardial infarction due to demand ischemia (HCC) 01/12/2023   New onset atrial fibrillation (HCC) 01/12/2023   Pain in right hip 10/03/2023   Peripheral vascular disease (HCC) 10/03/2023   Pneumonia    Polycythemia, secondary 07/07/2020   Screening for malignant neoplasm of colon 10/03/2023   Shortness of breath    with exertion; lasted used inhaler 06/02/13   Tobacco use 07/03/2020   Type 2 diabetes mellitus with hyperlipidemia (HCC) 07/07/2020    Past Surgical History:  Procedure Laterality Date   ABDOMINAL HYSTERECTOMY     BREAST LUMPECTOMY WITH NEEDLE LOCALIZATION Left 06/12/2013   Procedure: LEFT BREAST LUMPECTOMY WITH NEEDLE LOCALIZATION;  Surgeon: Donnice POUR. Belinda, MD;   Location: [facility] OR;  Service: General;  Laterality: Left;   BREAST SURGERY     HIP FRACTURE SURGERY Right    LEFT HEART CATH AND CORONARY ANGIOGRAPHY N/A 01/17/2023   Procedure: LEFT HEART CATH AND CORONARY ANGIOGRAPHY;  Surgeon: Mady Bruckner, MD;  Location: [facility] INVASIVE CV LAB;  Service: Cardiovascular;  Laterality: N/A;    Current Medications: Current Meds  Medication Sig   acetaminophen  (TYLENOL ) 500 MG tablet Take 500 mg by mouth every 6 (six) hours as needed for mild pain (pain score 1-3) or moderate pain (pain score 4-6).   albuterol  (VENTOLIN  HFA) 108 (90 Base) MCG/ACT inhaler Inhale 1 puff into the lungs every 6 (six) hours as needed for wheezing or shortness of breath.   amLODipine  (NORVASC ) 10 MG tablet Take 10 mg by mouth daily.   apixaban  (ELIQUIS ) 5 MG TABS tablet Take 1 tablet (5 mg total) by mouth 2 (two) times daily.   ascorbic acid (VITAMIN C) 500 MG tablet Take 500 mg by mouth daily.   aspirin  EC 81 MG tablet Take 1 tablet (81 mg total) by mouth daily. Swallow whole.   bisoprolol  (ZEBETA ) 5 MG tablet Take 1 tablet (5 mg total) by mouth daily.  Calcium  Carb-Cholecalciferol (CALCIUM  + VITAMIN D3 PO) Take 1 tablet by mouth 2 (two) times daily.   cetirizine (ZYRTEC) 10 MG chewable tablet Chew 10 mg by mouth daily.   dapagliflozin  propanediol (FARXIGA ) 10 MG TABS tablet Take 1 tablet (10 mg total) by mouth daily.   fluticasone  (FLONASE ) 50 MCG/ACT nasal spray Place 1-2 sprays into both nostrils daily as needed for allergies.   Fluticasone -Umeclidin-Vilant (TRELEGY ELLIPTA ) 100-62.5-25 MCG/ACT AEPB Inhale 1 puff into the lungs daily.   losartan  (COZAAR ) 25 MG tablet Take 1 tablet (25 mg total) by mouth daily.   losartan  (COZAAR ) 50 MG tablet Take 50 mg by mouth daily.   metFORMIN  (GLUCOPHAGE ) 1000 MG tablet Take 1,000 mg by mouth daily.   metoprolol  succinate (TOPROL -XL) 25 MG 24 hr tablet Take 25 mg by mouth daily.   montelukast (SINGULAIR) 10 MG tablet Take 10 mg by mouth at  bedtime.   Multiple Vitamin (MULTIVITAMIN) tablet Take 1 tablet by mouth daily.   omeprazole (PRILOSEC) 20 MG capsule Take 20 mg by mouth daily.   predniSONE  (DELTASONE ) 10 MG tablet Take 10 mg by mouth. Taper   rosuvastatin  (CRESTOR ) 5 MG tablet Take 5 mg by mouth daily.     Allergies:   Patient has no known allergies.   Social History   Socioeconomic History   Marital status: Single    Spouse name: Not on file   Number of children: Not on file   Years of education: Not on file   Highest education level: Not on file  Occupational History   Not on file  Tobacco Use   Smoking status: Every Day    Current packs/day: 0.25    Average packs/day: 0.3 packs/day for 40.0 years (10.0 ttl pk-yrs)    Types: Cigarettes   Smokeless tobacco: Never   Tobacco comments:    Smokes 4/day 05/03/23  Vaping Use   Vaping status: Never Used  Substance and Sexual Activity   Alcohol use: Yes    Comment: rare   Drug use: No   Sexual activity: Not on file  Other Topics Concern   Not on file  Social History Narrative   Not on file   Social Drivers of Health   Financial Resource Strain: Not on file  Food Insecurity: No Food Insecurity (01/16/2023)   Hunger Vital Sign    Worried About Running Out of Food in the Last Year: Never true    Ran Out of Food in the Last Year: Never true  Transportation Needs: No Transportation Needs (01/16/2023)   PRAPARE - Administrator, Civil Service (Medical): No    Lack of Transportation (Non-Medical): No  Physical Activity: Not on file  Stress: Not on file  Social Connections: Not on file     Family History: The patient's family history includes Cancer in her mother; Diabetes in her father. ROS:   Please see the history of present illness.    All 14 point review of systems negative except as described per history of present illness.  EKGs/Labs/Other Studies Reviewed:    The following studies were reviewed today:   EKG:       Recent  Labs: 01/12/2023: TSH 0.573 01/13/2023: ALT 26 01/17/2023: B Natriuretic Peptide 241.7 01/18/2023: Magnesium  1.8 06/06/2023: BUN 8; Creatinine, Ser 0.92; Hemoglobin 14.7; Platelets 214; Potassium 4.8; Sodium 142  Recent Lipid Panel    Component Value Date/Time   CHOL 92 01/13/2023 0150   TRIG 46 01/13/2023 0150   HDL 51 01/13/2023 0150  CHOLHDL 1.8 01/13/2023 0150   VLDL 9 01/13/2023 0150   LDLCALC 32 01/13/2023 0150    Physical Exam:    VS:  BP (!) 124/42   Pulse 88   Ht 5' 4 (1.626 m)   Wt 192 lb (87.1 kg)   SpO2 (!) 88%   BMI 32.96 kg/m     Wt Readings from Last 3 Encounters:  10/26/23 192 lb (87.1 kg)  08/17/23 175 lb 6.4 oz (79.6 kg)  06/06/23 174 lb (78.9 kg)     GENERAL:  Well nourished, well developed in no acute distress NECK: No JVD; No carotid bruits CARDIAC: RRR, S1 and S2 present, 3/6 ejection systolic murmur CHEST:  Clear to auscultation without rales, wheezing or rhonchi  Extremities: 1+ bilateral pitting ankle edema. Pulses bilaterally symmetric with radial 2+ and dorsalis pedis 2+ NEUROLOGIC:  Alert and oriented x 3  Medication Adjustments/Labs and Tests Ordered: Current medicines are reviewed at length with the patient today.  Concerns regarding medicines are outlined above.  No orders of the defined types were placed in this encounter.  No orders of the defined types were placed in this encounter.   Signed, Deanna jess Kobus, MD, MPH, [facility]. 10/26/2023 1:35 PM    [facility]

## 2023-11-13 ENCOUNTER — Ambulatory Visit: Payer: No Typology Code available for payment source | Admitting: Pulmonary Disease

## 2023-12-08 ENCOUNTER — Ambulatory Visit: Payer: No Typology Code available for payment source | Admitting: Emergency Medicine

## 2023-12-25 ENCOUNTER — Telehealth: Payer: Self-pay | Admitting: Internal Medicine

## 2023-12-25 NOTE — Telephone Encounter (Signed)
 I went to the front desk to check for the notes from the NP, I did not find any notes in ND's box.

## 2023-12-25 NOTE — Telephone Encounter (Signed)
 ATC Kasey at [facility], I had to leave a VM.

## 2023-12-25 NOTE — Telephone Encounter (Signed)
 [facility], the PT's PCP is calling. This PT has an appointment tomorrow but they wanted [place] to know the PT is on 5 LT of 02 @ night and is still desaturating. She will fax over their NP's notes. She had a recent hospital admission.    Kasey @ [facility] [phone]

## 2023-12-26 ENCOUNTER — Ambulatory Visit: Admitting: Internal Medicine

## 2023-12-26 NOTE — Telephone Encounter (Signed)
 ATC Kasey x2.  LVM x2.

## 2023-12-27 NOTE — Telephone Encounter (Signed)
 [facility] ret. [place] call.She did fax info.

## 2023-12-29 NOTE — Telephone Encounter (Signed)
 ATC patient [phone]), call could not be completed as dialed.  ATC Kasey x1.  LVM to return call.

## 2024-01-04 ENCOUNTER — Encounter: Payer: Self-pay | Admitting: Emergency Medicine

## 2024-01-04 ENCOUNTER — Ambulatory Visit (INDEPENDENT_AMBULATORY_CARE_PROVIDER_SITE_OTHER): Admitting: Emergency Medicine

## 2024-01-04 VITALS — BP 119/50 | HR 94 | Temp 97.7°F | Ht 62.0 in | Wt 180.4 lb

## 2024-01-04 DIAGNOSIS — G4733 Obstructive sleep apnea (adult) (pediatric): Secondary | ICD-10-CM | POA: Insufficient documentation

## 2024-01-04 DIAGNOSIS — J449 Chronic obstructive pulmonary disease, unspecified: Secondary | ICD-10-CM

## 2024-01-04 DIAGNOSIS — R9389 Abnormal findings on diagnostic imaging of other specified body structures: Secondary | ICD-10-CM | POA: Diagnosis not present

## 2024-01-04 DIAGNOSIS — Z720 Tobacco use: Secondary | ICD-10-CM

## 2024-01-04 DIAGNOSIS — J9611 Chronic respiratory failure with hypoxia: Secondary | ICD-10-CM | POA: Diagnosis not present

## 2024-01-04 DIAGNOSIS — R911 Solitary pulmonary nodule: Secondary | ICD-10-CM

## 2024-01-04 HISTORY — DX: Obstructive sleep apnea (adult) (pediatric): G47.33

## 2024-01-04 NOTE — Assessment & Plan Note (Signed)
 Multifactorial due to COPD and also restrictive disease from obesity, OSA/OHS.  She is now on CPAP.  O2 needs are 5 L/min during the day and she states that she is also bleeding oxygen into her CPAP at night.  I will perform a nocturnal oximetry on her CPAP plus oxygen to ensure that she is not desaturating.

## 2024-01-04 NOTE — Progress Notes (Signed)
 Subjective:    Patient ID: Deanna Petty, female    DOB: 04/15/1946, 78 y.o.   MRN: [id number]  HPI 78 year old active smoker (100+ pack year) with a history of COPD, hypertension, atrial fibrillation with chronic diastolic CHF.  Also type 2 diabetes, osteoarthritis, PVD.  She has chronic hypoxemic respiratory failure and is on 3 L/min via POC. Has OSA and was just started on CPAP per her report, has O2 bled in, she believes this is 5L/min. Her DME is [employer]. Smoking 2 cig a day. She uses albuterol nebs 3-4x a day.  She can walk some w a walker, but principally in a wheelchair. She believes her breathing is doing ok. She has some daily cough, clear mucus  She has a spiculated 1.8 cm left upper lobe nodule that was identified 12/2022.  PET scan was then done 04/12/2023 that showed hypermetabolism in the left upper lobe lesion.  It does not appear that subsequent workup has been done.  She lives at home w support from [place]. Per phone notes from [facility], apparently she has had observed desaturations at night even on 5 L/min O2.  It is noted in the chart that at one point she was considered for a trilogy ventilator, now has a CPAP.  She is followed by Dr. Isaiah Serge in our office.  PFTs done 08/29/2023 reviewed by me show moderately severe obstruction without a bronchodilator response, decreased diffusion capacity that corrects to the normal range when adjusted for alveolar volume.   Review of Systems As per HPI  Past Medical History:  Diagnosis Date   Acute heart failure with preserved ejection fraction (HFpEF) (HCC) 01/15/2023   Acute on chronic diastolic CHF (congestive heart failure) (HCC) 07/07/2020   Age-related osteoporosis without current pathological fracture 10/03/2023   Angiopathy, diabetic (HCC) 10/03/2023   Bronchitis    Chronic respiratory failure with hypoxia (HCC) 05/24/2022   Cigarette smoker    COPD (chronic obstructive pulmonary disease) (HCC)    COPD exacerbation (HCC)  01/15/2022   COPD with acute exacerbation (HCC) 01/12/2023   Elevated brain natriuretic peptide (BNP) level 07/03/2020   Essential hypertension 01/15/2022   Hypercoagulable state due to paroxysmal atrial fibrillation (HCC) 01/18/2023   Hypokalemia 01/12/2023   Hypomagnesemia 01/16/2022   Left breast mass 05/14/2013   Lung nodule 08/17/2023   Myocardial infarction due to demand ischemia (HCC) 01/12/2023   New onset atrial fibrillation (HCC) 01/12/2023   Pain in right hip 10/03/2023   Peripheral vascular disease (HCC) 10/03/2023   Pneumonia    Polycythemia, secondary 07/07/2020   Screening for malignant neoplasm of colon 10/03/2023   Shortness of breath    with exertion; lasted used inhaler 06/02/13   Tobacco use 07/03/2020   Type 2 diabetes mellitus with hyperlipidemia (HCC) 07/07/2020     Family History  Problem Relation Age of Onset   Cancer Mother        oral, brain   Diabetes Father      Social History   Socioeconomic History   Marital status: Single    Spouse name: Not on file   Number of children: Not on file   Years of education: Not on file   Highest education level: Not on file  Occupational History   Not on file  Tobacco Use   Smoking status: Every Day    Current packs/day: 0.25    Average packs/day: 0.3 packs/day for 40.0 years (10.0 ttl pk-yrs)    Types: Cigarettes   Smokeless tobacco: Never  Tobacco comments:    Smokes 2cigs per day 01/04/2024  Vaping Use   Vaping status: Never Used  Substance and Sexual Activity   Alcohol use: Yes    Comment: rare   Drug use: No   Sexual activity: Not on file  Other Topics Concern   Not on file  Social History Narrative   Not on file   Social Drivers of Health   Financial Resource Strain: Not on file  Food Insecurity: No Food Insecurity (01/16/2023)   Hunger Vital Sign    Worried About Running Out of Food in the Last Year: Never true    Ran Out of Food in the Last Year: Never true  Transportation Needs: No  Transportation Needs (01/16/2023)   PRAPARE - Administrator, Civil Service (Medical): No    Lack of Transportation (Non-Medical): No  Physical Activity: Not on file  Stress: Not on file  Social Connections: Not on file  Intimate Partner Violence: Not At Risk (01/16/2023)   Humiliation, Afraid, Rape, and Kick questionnaire    Fear of Current or Ex-Partner: No    Emotionally Abused: No    Physically Abused: No    Sexually Abused: No     No Known Allergies   Outpatient Medications Prior to Visit  Medication Sig Dispense Refill   acetaminophen (TYLENOL) 500 MG tablet Take 500 mg by mouth every 6 (six) hours as needed for mild pain (pain score 1-3) or moderate pain (pain score 4-6).     albuterol (VENTOLIN HFA) 108 (90 Base) MCG/ACT inhaler Inhale 1 puff into the lungs every 6 (six) hours as needed for wheezing or shortness of breath.     amLODipine (NORVASC) 10 MG tablet Take 10 mg by mouth daily.     apixaban (ELIQUIS) 5 MG TABS tablet Take 1 tablet (5 mg total) by mouth 2 (two) times daily. 60 tablet 2   ascorbic acid (VITAMIN C) 500 MG tablet Take 500 mg by mouth daily.     aspirin EC 81 MG tablet Take 1 tablet (81 mg total) by mouth daily. Swallow whole. 30 tablet 2   bisoprolol (ZEBETA) 5 MG tablet Take 1 tablet (5 mg total) by mouth daily. 30 tablet 2   Calcium Carb-Cholecalciferol (CALCIUM + VITAMIN D3 PO) Take 1 tablet by mouth 2 (two) times daily.     cetirizine (ZYRTEC) 10 MG chewable tablet Chew 10 mg by mouth daily.     dapagliflozin propanediol (FARXIGA) 10 MG TABS tablet Take 1 tablet (10 mg total) by mouth daily. 30 tablet 2   fluticasone (FLONASE) 50 MCG/ACT nasal spray Place 1-2 sprays into both nostrils daily as needed for allergies.     Fluticasone-Umeclidin-Vilant (TRELEGY ELLIPTA) 100-62.5-25 MCG/ACT AEPB Inhale 1 puff into the lungs daily. 28 each 5   losartan (COZAAR) 25 MG tablet Take 1 tablet (25 mg total) by mouth daily. 30 tablet 2   losartan (COZAAR) 50  MG tablet Take 50 mg by mouth daily.     metFORMIN (GLUCOPHAGE) 1000 MG tablet Take 1,000 mg by mouth daily.     metoprolol succinate (TOPROL-XL) 25 MG 24 hr tablet Take 25 mg by mouth daily.     montelukast (SINGULAIR) 10 MG tablet Take 10 mg by mouth at bedtime.     Multiple Vitamin (MULTIVITAMIN) tablet Take 1 tablet by mouth daily.     omeprazole (PRILOSEC) 20 MG capsule Take 20 mg by mouth daily.     rosuvastatin (CRESTOR) 5 MG tablet Take  5 mg by mouth daily.     predniSONE (DELTASONE) 10 MG tablet Take 10 mg by mouth. Taper (Patient not taking: Reported on 01/04/2024)     No facility-administered medications prior to visit.         Objective:   Physical Exam Vitals:   01/04/24 1132  BP: (!) 119/50  Pulse: 94  Temp: 97.7 F (36.5 C)  TempSrc: Oral  SpO2: 94%  Weight: 180 lb 6.4 oz (81.8 kg)  Height: 5\' 2"  (1.575 m)   Gen: Obese woman in a wheelchair, in no distress,  normal affect  ENT: No lesions,  mouth clear,  oropharynx clear, no postnasal drip  Neck: No JVD, no stridor  Lungs: No use of accessory muscles, very distant, decreased at both bases without wheezes or crackles  Cardiovascular: RRR, heart sounds normal, no murmur or gallops, trace peripheral edema  Musculoskeletal: No deformities, no cyanosis or clubbing  Neuro: alert, awake, non focal  Skin: Warm, no lesions or rash       Assessment & Plan:  Abnormal CT of the chest With a predominant hypermetabolic left upper lobe pulmonary nodule, smaller mixed density pulmonary nodule in the superior segment of the left lower lobe.  Concerning for primary lung cancer.  No biopsy has been performed, this is the first time she has been seen to discuss.  I am going to repeat her super D CT chest now to look for interval stability, interval change.  I suspect that the left upper lobe nodule will have grown.  Her chronic hypoxemic respiratory failure puts her at high risk for biopsy.  I am not sure she can tolerate  general anesthesia for navigational bronchoscopy.  If suspicion for malignancy remains high then we may need to talk about possible empiric treatment.  I will review the scan and discussed with her once available.  Tobacco use Still smoking 2 cigarettes daily.  Discussed cessation with her.  COPD (chronic obstructive pulmonary disease) (HCC) Severe COPD.  Plan to continue Trelegy.  Albuterol as needed.  Needs smoking cessation  Chronic respiratory failure with hypoxia (HCC) Multifactorial due to COPD and also restrictive disease from obesity, OSA/OHS.  She is now on CPAP.  O2 needs are 5 L/min during the day and she states that she is also bleeding oxygen into her CPAP at night.  I will perform a nocturnal oximetry on her CPAP plus oxygen to ensure that she is not desaturating.  Time spent 42 minutes  Levy Pupa, MD, PhD 01/04/2024, 12:11 PM [facility] [phone] or if no answer before 7:00PM call [phone] For any issues after 7:00PM please call eLink [phone]

## 2024-01-04 NOTE — Assessment & Plan Note (Signed)
 Severe COPD.  Plan to continue Trelegy.  Albuterol as needed.  Needs smoking cessation

## 2024-01-04 NOTE — Assessment & Plan Note (Signed)
 With a predominant hypermetabolic left upper lobe pulmonary nodule, smaller mixed density pulmonary nodule in the superior segment of the left lower lobe.  Concerning for primary lung cancer.  No biopsy has been performed, this is the first time she has been seen to discuss.  I am going to repeat her super D CT chest now to look for interval stability, interval change.  I suspect that the left upper lobe nodule will have grown.  Her chronic hypoxemic respiratory failure puts her at high risk for biopsy.  I am not sure she can tolerate general anesthesia for navigational bronchoscopy.  If suspicion for malignancy remains high then we may need to talk about possible empiric treatment.  I will review the scan and discussed with her once available.

## 2024-01-04 NOTE — Assessment & Plan Note (Signed)
 Still smoking 2 cigarettes daily.  Discussed cessation with her.

## 2024-01-04 NOTE — Patient Instructions (Addendum)
 We will plan to repeat your CT scan of the chest to compare with your priors.  Please continue your Trelegy once daily.  Rinse and gargle after using. Continue albuterol either 2 puffs or 1 nebulizer treatment up to every 4 hours if needed for shortness of breath, chest tightness, wheezing. Continue your oxygen at 5 L/min. Continue your CPAP at night with oxygen bled in. We will perform an overnight oximetry with you on your CPAP and oxygen so we can confirm that your oxygen levels are at goal, greater than 90% Continue to work on decreasing your cigarettes. Follow with Dr. Delton Coombes next available after your CT chest so we can review those results together.

## 2024-01-10 NOTE — Telephone Encounter (Signed)
 NFN has been seen

## 2024-01-11 ENCOUNTER — Ambulatory Visit (INDEPENDENT_AMBULATORY_CARE_PROVIDER_SITE_OTHER)
Admission: RE | Admit: 2024-01-11 | Discharge: 2024-01-11 | Disposition: A | Discharge status: Home / Self Care | Source: Ambulatory Visit | Attending: Emergency Medicine | Admitting: Emergency Medicine

## 2024-01-11 DIAGNOSIS — R911 Solitary pulmonary nodule: Secondary | ICD-10-CM

## 2024-01-17 ENCOUNTER — Ambulatory Visit: Admitting: Emergency Medicine

## 2024-01-25 ENCOUNTER — Ambulatory Visit (INDEPENDENT_AMBULATORY_CARE_PROVIDER_SITE_OTHER): Payer: No Typology Code available for payment source | Admitting: Emergency Medicine

## 2024-01-25 ENCOUNTER — Encounter: Payer: Self-pay | Admitting: Emergency Medicine

## 2024-01-25 VITALS — BP 129/59 | HR 65 | Ht 62.0 in | Wt 182.4 lb

## 2024-01-25 DIAGNOSIS — J9611 Chronic respiratory failure with hypoxia: Secondary | ICD-10-CM | POA: Diagnosis not present

## 2024-01-25 DIAGNOSIS — J449 Chronic obstructive pulmonary disease, unspecified: Secondary | ICD-10-CM | POA: Diagnosis not present

## 2024-01-25 DIAGNOSIS — R918 Other nonspecific abnormal finding of lung field: Secondary | ICD-10-CM

## 2024-01-25 DIAGNOSIS — Z720 Tobacco use: Secondary | ICD-10-CM

## 2024-01-25 NOTE — Progress Notes (Signed)
   Subjective:    Patient ID: Deanna Petty, female    DOB: 03-13-1946, 78 y.o.   MRN: [id number]  HPI ROV 01/25/2024 --follow-up visit 78 year old active smoker with COPD, OSA on CPAP, atrial fibrillation with hypertension and chronic diastolic dysfunction.  I saw her in March for a spiculated superior segmental left lower lobe pulmonary nodule, first identified 01/12/2023.  No intervention had yet been made.  We repeated her CT chest 01/11/2024 as below and there has been interval increase in size. Smoking 3 cig a day. On trelegy.  Quite limited but overall breathing is stable.  In a wheelchair O2 at 5L/min during day and for sleep.   CT scan of the chest 01/11/2024 reviewed by me, shows a spiculated nodule in the superior segment of the left lower lobe now measuring 3.1 cm in largest diameter by my measurement.  There may be a small subcarinal lymph node.  The official radiology read is not yet available.     Review of Systems As per HPI        Objective:   Physical Exam Vitals:   01/25/24 1159  BP: (!) 129/59  Pulse: 65  SpO2: 94%  Weight: 182 lb 6.4 oz (82.7 kg)  Height: 5\' 2"  (1.575 m)    Gen: Obese woman in a wheelchair, in no distress,  normal affect  ENT: No lesions,  mouth clear,  oropharynx clear, no postnasal drip  Neck: No JVD, no stridor  Lungs: No use of accessory muscles, very distant, decreased at both bases without wheezes or crackles  Cardiovascular: RRR, heart sounds normal, no murmur or gallops, trace peripheral edema  Musculoskeletal: No deformities, no cyanosis or clubbing  Neuro: alert, awake, non focal  Skin: Warm, no lesions or rash       Assessment & Plan:  Mass of left lung Consistent with primary lung malignancy.  There have been significant delays in her evaluation due to her debilitation, assisted living, transportation difficulties.  She is on 5 L/min, quite debilitated at baseline.  I do not think she is a good candidate for  bronchoscopy.  I am going to refer her for possible TTNA and hopefully she will be a candidate for this.  I will get a PET scan to look for other targets for biopsy.  If she is not a candidate for any kind of biopsy then may need to review the PET and then discussed with radiation oncology about possible empiric SBRT.  Tobacco use Discussed cessation with her  COPD (chronic obstructive pulmonary disease) (HCC) Continue Trelegy and albuterol as needed  Chronic respiratory failure with hypoxia (HCC) Continue 5 L/min   Time spent 40 minutes  Levy Pupa, MD, PhD 01/25/2024, 1:00 PM [facility] [phone] or if no answer before 7:00PM call [phone] For any issues after 7:00PM please call eLink [phone]

## 2024-01-25 NOTE — Assessment & Plan Note (Signed)
 Consistent with primary lung malignancy.  There have been significant delays in her evaluation due to her debilitation, assisted living, transportation difficulties.  She is on 5 L/min, quite debilitated at baseline.  I do not think she is a good candidate for bronchoscopy.  I am going to refer her for possible TTNA and hopefully she will be a candidate for this.  I will get a PET scan to look for other targets for biopsy.  If she is not a candidate for any kind of biopsy then may need to review the PET and then discussed with radiation oncology about possible empiric SBRT.

## 2024-01-25 NOTE — Assessment & Plan Note (Signed)
 Continue 5 L/min

## 2024-01-25 NOTE — Assessment & Plan Note (Signed)
Discussed cessation with her

## 2024-01-25 NOTE — Assessment & Plan Note (Signed)
 Continue Trelegy and albuterol as needed

## 2024-01-25 NOTE — Patient Instructions (Addendum)
 We will work on scheduling a PET scan as soon as possible We will refer you to interventional radiology to consider needle biopsy see of left lung mass.  Please continue your Trelegy 1 inhalation once daily.  Rinse and gargle after using. Keep your albuterol available use 2 puffs when needed for shortness of breath, chest tightness, wheezing. Continue your oxygen at 5 L/min at all times Follow with Dr Delton Coombes in 6 weeks or sooner if you have any problems

## 2024-01-26 ENCOUNTER — Telehealth: Payer: Self-pay

## 2024-01-26 NOTE — Telephone Encounter (Signed)
 Copied from CRM [id number]. Topic: General - Other >> Jan 25, 2024  3:43 PM Deanna Petty wrote: Reason for CRM: CT Guided Needle Placement needs to be scheduled with [facility], as the [place] location it was sent to does not offer this service. Please resend. No requested timeline.  Routing to [facility] to schedule CT guided needle through [facility] as [facility] cannot do it.

## 2024-01-26 NOTE — Progress Notes (Unsigned)
 Mugweru, Cletis Athens, MD  Shirlean Mylar L PROCEDURE / BIOPSY REVIEW Date: 01/25/24  Requested Biopsy site: L lung Reason for request: mass Imaging review: Best seen on CT chest, PET CT  Decision: Approved Imaging modality to perform: CT Schedule with: Moderate Sedation Schedule for: Any VIR  Additional comments: @VIR : not a bronch candidate given comorbidity @Schedulers . CT L lung mass Bx  Please contact me with questions, concerns, or if issue pertaining to this request arise.  Roanna Banning, MD [facility]       Previous Messages    ----- Message ----- From: Alain Marion Sent: 01/25/2024   2:46 PM EDT To: Alain Marion; Ir Procedure Requests Subject: CT GUIDED NEEDLE PLACEMENT                    Procedure :CT GUIDED NEEDLE PLACEMENT  Reason :Left lower lobe mass,Dx: Lung mass  History :CT SUPER D CHEST WO CONTRAST,DG Chest 2 View ,CT Angio Chest PE W and/or Wo Contrast  Provider: Leslye Peer, MD  Provider contact ; [phone]

## 2024-01-26 NOTE — Telephone Encounter (Signed)
 Spoke with [facility] they have to send it back for the radiologist to review then they will call the pt and get her scheduled as soon as the review is finished. I gave the contact info for [facility] senior care for scheduling. Will schedule her at [facility]. NFN at this time.

## 2024-01-26 NOTE — Telephone Encounter (Signed)
 Copied from CRM [id number]. Topic: Appointments - Scheduling Inquiry for Clinic >> Jan 25, 2024  4:53 PM Brennan Bailey S wrote: Reason for CRM: lori from [facility] calling to speak to dr. Delton Coombes about emergeny pet scan requested. Please call at [phone]   [facility] phone number provided. No answer. Left voicemail to return call

## 2024-01-26 NOTE — Progress Notes (Signed)
 Sterling Big, MD  Claudean Kinds Deferred for now.  I spoke with Dr. Delton Coombes.  HKM       Previous Messages    ----- Message ----- From: Claudean Kinds Sent: 01/26/2024   9:45 AM EDT To: Claudean Kinds; Ir Procedure Requests Subject: CT GUIDED NEEDLE PLACEMENT                    Procedure : CT GUIDED NEEDLE PLACEMENT  Reason : Left lower lobe mass Dx: Lung mass [R91.8 (ICD-10-CM)]  Ordering Comments  Cannot do needle biopsies at [facility]    History: CT Super D Chest , chest xray  Provider : Leslye Peer, MD  Contact : [phone]

## 2024-01-29 ENCOUNTER — Ambulatory Visit (INDEPENDENT_AMBULATORY_CARE_PROVIDER_SITE_OTHER)
Admission: RE | Admit: 2024-01-29 | Discharge: 2024-01-29 | Disposition: A | Discharge status: Home / Self Care | Source: Ambulatory Visit | Attending: Emergency Medicine | Admitting: Emergency Medicine

## 2024-01-29 DIAGNOSIS — R918 Other nonspecific abnormal finding of lung field: Secondary | ICD-10-CM | POA: Diagnosis not present

## 2024-01-29 MED ORDER — FLUDEOXYGLUCOSE F - 18 (FDG) INJECTION
10.4000 | Freq: Once | INTRAVENOUS | Status: AC | PRN
  Administered 2024-01-29: 9.01 via INTRAVENOUS

## 2024-02-01 ENCOUNTER — Other Ambulatory Visit: Payer: Self-pay | Admitting: Emergency Medicine

## 2024-02-01 DIAGNOSIS — R918 Other nonspecific abnormal finding of lung field: Secondary | ICD-10-CM

## 2024-02-01 NOTE — Telephone Encounter (Signed)
 Spoke with Deanna Petty from [facility]. She was calling to make sure Dr. Baldwin Levee was aware of PET scan from 04/10/2023. Please advise Dr. Baldwin Levee as Deanna Petty and Deanna Petty are concerned of most previous PET scan results.

## 2024-02-01 NOTE — Telephone Encounter (Signed)
 I reviewed the PET scan, shows hypermetabolism in the concerning lesion without any evidence of distant disease.  I discussed her case with interventional radiology and it was felt that she was high risk to be in the prone position for a biopsy.  Based on this and based on her risk for bronchoscopy I am going to refer her to radiation oncology for possible empiric SBRT.  Please let the patient and her caregiver know that I have reviewed the PET scan and based on this I am going to send her to radiation oncology to consider treatment of her suspicious lung nodule.  We are going to hold off on a biopsy for now.

## 2024-02-01 NOTE — Progress Notes (Signed)
 Discussed patient's case with IR.  She is high risk for any kind of biopsy.  Based on her PET scan which appears to show isolated disease I will refer her to radiation oncology to consider possible empiric SBRT.

## 2024-02-01 NOTE — Telephone Encounter (Signed)
 Spoke with Avanell Bob (caregiver) informed her of Deanna Petty's note   I reviewed the PET scan, shows hypermetabolism in the concerning lesion without any evidence of distant disease.  I discussed her case with interventional radiology and it was felt that she was high risk to be in the prone position for a biopsy.  Based on this and based on her risk for bronchoscopy I am going to refer her to radiation oncology for possible empiric SBRT.   Please let the patient and her caregiver know that I have reviewed the PET scan and based on this I am going to send her to radiation oncology to consider treatment of her suspicious lung nodule.  We are going to hold off on a biopsy for now.   Faxing referral order & notes from Deanna Petty to [facility] regarding PET scan 01/29/24. [phone]   Spoke with Stevens Eland & [name] [facility]) informed her of Deanna Petty note. Pt and friend verbalized understanding & had no other concerns. NFN

## 2024-02-06 ENCOUNTER — Inpatient Hospital Stay
Admission: RE | Admit: 2024-02-06 | Discharge: 2024-02-06 | Disposition: A | Discharge status: Home / Self Care | Payer: Self-pay | Source: Ambulatory Visit | Attending: Radiation Oncology | Admitting: Radiation Oncology

## 2024-02-06 ENCOUNTER — Other Ambulatory Visit: Payer: Self-pay | Admitting: Radiation Oncology

## 2024-02-06 DIAGNOSIS — R918 Other nonspecific abnormal finding of lung field: Secondary | ICD-10-CM

## 2024-02-08 ENCOUNTER — Other Ambulatory Visit: Payer: Self-pay

## 2024-02-09 ENCOUNTER — Telehealth: Payer: Self-pay

## 2024-02-09 MED ORDER — LEVALBUTEROL HCL 0.63 MG/3ML IN NEBU: 0.6300 mg | INHALATION_SOLUTION | RESPIRATORY_TRACT | 12 refills | Status: DC | PRN

## 2024-02-09 NOTE — Telephone Encounter (Signed)
 Copied from CRM [id number]. Topic: Referral - Question >> Feb 01, 2024  2:46 PM Margarette Shawl wrote: Reason for CRM:   Lovett Ruck, with [facility] Scheduling, is calling in regards to a needle biopsy ordered by provider. She reports per last review with radiologist, they spoke with provider and had recommended deferring at this time. Requests call back to verify if the biopsy is still needed to be performed so that she can contact patient to schedule.   CB# [phone] - ask for Melissa  OR  Can send secure message to her(Melissa Xayasine)  Called and spoke with [facility] health imaging. Notified them that needle biopsy was going to be held off for now per RB  "I reviewed the PET scan, shows hypermetabolism in the concerning lesion without any evidence of distant disease.  I discussed her case with interventional radiology and it was felt that she was high risk to be in the prone position for a biopsy.  Based on this and based on her risk for bronchoscopy I am going to refer her to radiation oncology for possible empiric SBRT. Please let the patient and her caregiver know that I have reviewed the PET scan and based on this I am going to send her to radiation oncology to consider treatment of her suspicious lung nodule.  We are going to hold off on a biopsy for now."   Biopsy is on hold. NFN

## 2024-02-09 NOTE — Telephone Encounter (Signed)
 Copied from CRM [id number]. Topic: Referral - Request for Referral >> Feb 05, 2024  9:08 AM Ilene Malling wrote: Avanell Bob from [facility] [phone] states spoke with the nurse last Thursday and received a fax but it was blank. The fax was suppose to have patient's chart note, recommendation and referral, please resend Fax# [phone].  Fax has been sent again to Avanell Bob at [facility]. Left VM stating I sent fax again.

## 2024-02-09 NOTE — Telephone Encounter (Signed)
 Med sent to new pharmacy. LDVM for Deanna Petty regarding med has been sent

## 2024-02-12 ENCOUNTER — Telehealth: Payer: Self-pay

## 2024-02-12 NOTE — Telephone Encounter (Signed)
 Copied from CRM [id number]. Topic: Clinical - Prescription Issue >> Feb 09, 2024  2:35 PM Tyronne Galloway wrote: Reason for CRM: Deanna Petty called in stating she had a missed call from [facility] regarding medication sent to a pharmacy. [name] said the vm left said [facility] on [address], but I do not show a medication sent to that pharmacy nor is it in the patient's preferred pharmacy section. Please clear this up and leave more details in the encounters for e2c2. Best phone number for [name] is [phone] and you can leave a vm per [name].    Spoke w/ PT okay per HIPAA . Rx sent to wrong  pharmacy ... [facility] said they look to see if if was shipped already waite on call back from them the Rx needs to be sent to the new updated pharmacy [facility]

## 2024-02-15 NOTE — Progress Notes (Signed)
 Thoracic Location of Tumor / Histology: Left Lower Lobe Lung   Patient presented   PET 01/29/2024: Increasing size of hypermetabolic nodule in the superior segment left lower lobe now measuring up to 2.9 x 2.6 cm.   Past/Anticipated interventions by pulmonary, if any Dr. Baldwin Levee 02/01/2024 -I reviewed the PET scan, shows hypermetabolism in the concerning lesion without any evidence of distant disease.  -I discussed her case with interventional radiology and it was felt that she was high risk to be in the prone position for a biopsy   01/25/2024 -I do not think she is a good candidate for bronchoscopy.  -I am going to refer her for possible TTNA and hopefully she will be a candidate for this.  -I will get a PET scan to look for other targets for biopsy.  -If she is not a candidate for any kind of biopsy then may need to review the PET and then discussed with radiation oncology about possible empiric SBRT.   Past/Anticipated interventions by cardiothoracic surgery, if any:   Past/Anticipated interventions by medical oncology, if any:    Tobacco/Marijuana/Snuff/ETOH use: Current Smoker  Signs/Symptoms Weight changes, if any:  Respiratory complaints, if any: Wears 5 liters Oxygen Hemoptysis, if any:  Pain issues, if any:    SAFETY ISSUES: Prior radiation?  Pacemaker/ICD?   Possible current pregnancy? Is the patient on methotrexate?   Current Complaints / other details:   -Lives in assisted living

## 2024-02-19 DIAGNOSIS — C3432 Malignant neoplasm of lower lobe, left bronchus or lung: Secondary | ICD-10-CM | POA: Insufficient documentation

## 2024-02-19 HISTORY — DX: Malignant neoplasm of lower lobe, left bronchus or lung: C34.32

## 2024-02-19 NOTE — Progress Notes (Signed)
 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   HISTORY OF PRESENT ILLNESS: Deanna Petty is a 78 y.o. female seen at the request of Dr. Baldwin Levee for a suspicious lung nodule. The patient has a history of chronic respiratory failure requiring supplemental oxygen.  She was seen by pulmonary medicine in October 2020 for and was encouraged to proceed with additional workup of a nodule that had been seen by CTA in March 2024, she had no showed several appointments for PET scan and for further evaluation for bronchoscopy.  Her March 2024 CTA showed a 1.8 cm left lung nodule concerning for bronchogenic disease.  She apparently had a PET scan at [facility] on 04/11/2023 and the lung nodule in the left lower lobe measuring 1.8 cm with an SUV of 11.2, there was also a part solid nodule within the superior segment of the left lower lobe measuring 1.6 cm with an SUV of 1.5.  But too small to characterize right lower lobe nodule measured 4 mm.  No evidence of other metastatic disease was appreciated.  She proceeded with PET on 01/29/2024 which showed persistent hypermetabolic activity with an SUV range of approximately 11.2 but measured on the CT aspect of the image to be 2.9 cm in greatest dimension, the previous focus of uptake in the right lung was no longer visualized and a nodular focus persisted 15 mm that was semisolid.  4 mm right lung nodule was not well-seen.  She was counseled on further workup but was not felt to be a good candidate for bronchoscopy, given the high degree of clinical suspicion for this being a malignancy, she is seen to consider definitive radiotherapy.    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/2023   Bronchitis    Chronic respiratory failure with hypoxia (HCC) 05/24/2022   Cigarette smoker    COPD (chronic obstructive pulmonary disease) (HCC)    COPD exacerbation (HCC) 01/15/2022   COPD with acute exacerbation (HCC) 01/12/2023   Elevated brain natriuretic peptide (BNP) level 07/03/2020   Essential hypertension 01/15/2022   Hypercoagulable state due to paroxysmal atrial fibrillation (HCC) 01/18/2023   Hypokalemia 01/12/2023   Hypomagnesemia 01/16/2022   Left breast mass 05/14/2013   Lung nodule 08/17/2023   Myocardial infarction due to demand ischemia (HCC) 01/12/2023   New onset atrial fibrillation (HCC) 01/12/2023   Pain in right hip 10/03/2023   Peripheral vascular disease (HCC) 10/03/2023   Pneumonia    Polycythemia, secondary 07/07/2020   Screening for malignant neoplasm of colon 10/03/2023   Shortness of breath    with exertion; lasted used inhaler 06/02/13   Tobacco use 07/03/2020   Type 2 diabetes mellitus with hyperlipidemia (HCC) 07/07/2020       PAST SURGICAL HISTORY: Past Surgical History:  Procedure Laterality Date   ABDOMINAL HYSTERECTOMY     BREAST LUMPECTOMY WITH  NEEDLE LOCALIZATION Left 06/12/2013   Procedure: LEFT BREAST LUMPECTOMY WITH NEEDLE LOCALIZATION;  Surgeon: Kari Otto. Eli Grizzle, MD;  Location: [facility] OR;  Service: General;  Laterality: Left;   BREAST SURGERY     HIP FRACTURE SURGERY Right    LEFT HEART CATH AND CORONARY ANGIOGRAPHY N/A 01/17/2023   Procedure: LEFT HEART CATH AND CORONARY ANGIOGRAPHY;  Surgeon: Sammy Crisp, MD;  Location: [facility] INVASIVE CV LAB;  Service:  Cardiovascular;  Laterality: N/A;     FAMILY HISTORY:  Family History  Problem Relation Age of Onset   Cancer Mother        oral, brain   Diabetes Father      SOCIAL HISTORY:  reports that she has been smoking cigarettes. She has a 10 pack-year smoking history. She has never used smokeless tobacco. She reports current alcohol use. She reports that she does not use drugs.  The patient is single and lives in [place].  She***   ALLERGIES: Patient has no known allergies.   MEDICATIONS:  Current Outpatient Medications  Medication Sig Dispense Refill   acetaminophen  (TYLENOL ) 500 MG tablet Take 500 mg by mouth every 6 (six) hours as needed for mild pain (pain score 1-3) or moderate pain (pain score 4-6).     albuterol  (VENTOLIN  HFA) 108 (90 Base) MCG/ACT inhaler Inhale 1 puff into the lungs every 6 (six) hours as needed for wheezing or shortness of breath.     amLODipine  (NORVASC ) 10 MG tablet Take 10 mg by mouth daily.     apixaban  (ELIQUIS ) 5 MG TABS tablet Take 1 tablet (5 mg total) by mouth 2 (two) times daily. 60 tablet 2   apixaban  (ELIQUIS ) 5 MG TABS tablet Take 5 mg by mouth 2 (two) times daily.     ascorbic acid (VITAMIN C) 500 MG tablet Take 500 mg by mouth daily.     aspirin  EC 81 MG tablet Take 1 tablet (81 mg total) by mouth daily. Swallow whole. 30 tablet 2   bisoprolol  (ZEBETA ) 5 MG tablet Take 1 tablet (5 mg total) by mouth daily. 30 tablet 2   Calcium  Carb-Cholecalciferol (CALCIUM  + VITAMIN D3 PO) Take 1 tablet by mouth 2 (two) times daily.     cetirizine (ZYRTEC) 10 MG chewable tablet Chew 10 mg by mouth daily.     dapagliflozin  propanediol (FARXIGA ) 10 MG TABS tablet Take 1 tablet (10 mg total) by mouth daily. 30 tablet 2   fluticasone  (FLONASE ) 50 MCG/ACT nasal spray Place 1-2 sprays into both nostrils daily as needed for allergies.     Fluticasone -Umeclidin-Vilant (TRELEGY ELLIPTA ) 100-62.5-25 MCG/ACT AEPB Inhale 1 puff into the lungs daily. 28 each 5    furosemide  (LASIX ) 20 MG tablet Take 20 mg by mouth daily.     ipratropium-albuterol  (DUONEB) 0.5-2.5 (3) MG/3ML SOLN Take 3 mLs by nebulization as needed (wheezing or shortness of breath).     levalbuterol  (XOPENEX ) 0.63 MG/3ML nebulizer solution Take 3 mLs (0.63 mg total) by nebulization every 4 (four) hours as needed for wheezing or shortness of breath. 3 mL 12   losartan  (COZAAR ) 25 MG tablet Take 1 tablet (25 mg total) by mouth daily. 30 tablet 2   losartan  (COZAAR ) 50 MG tablet Take 50 mg by mouth daily.     metFORMIN  (GLUCOPHAGE ) 1000 MG tablet Take 1,000 mg by mouth daily.     metoprolol  succinate (TOPROL -XL) 25 MG 24 hr tablet Take 25 mg by mouth daily.     montelukast (SINGULAIR) 10 MG tablet Take  10 mg by mouth at bedtime.     Multiple Vitamins-Minerals (ONCOVITE PO) Take 1 tablet by mouth daily.     omeprazole (PRILOSEC) 20 MG capsule Take 20 mg by mouth daily.     rosuvastatin  (CRESTOR ) 5 MG tablet Take 5 mg by mouth daily.     No current facility-administered medications for this visit.     REVIEW OF SYSTEMS: On review of systems, the patient reports that ***       PHYSICAL EXAM:  Wt Readings from Last 3 Encounters:  01/25/24 182 lb 6.4 oz (82.7 kg)  01/04/24 180 lb 6.4 oz (81.8 kg)  10/26/23 192 lb (87.1 kg)   Temp Readings from Last 3 Encounters:  01/04/24 97.7 F (36.5 C) (Oral)  08/17/23 98.7 F (37.1 C) (Temporal)  05/03/23 98.5 F (36.9 C) (Oral)   BP Readings from Last 3 Encounters:  01/25/24 (!) 129/59  01/04/24 (!) 119/50  10/26/23 (!) 124/42   Pulse Readings from Last 3 Encounters:  01/25/24 65  01/04/24 94  10/26/23 88    /10  In general this is a well*** appearing *** in no acute distress. She's alert and oriented x4 and appropriate throughout the examination. Cardiopulmonary assessment is negative for acute distress and she exhibits normal effort.     ECOG = ***  0 - Asymptomatic (Fully active, able to carry on all predisease activities  without restriction)  1 - Symptomatic but completely ambulatory (Restricted in physically strenuous activity but ambulatory and able to carry out work of a light or sedentary nature. For example, light housework, office work)  2 - Symptomatic, <50% in bed during the day (Ambulatory and capable of all self care but unable to carry out any work activities. Up and about more than 50% of waking hours)  3 - Symptomatic, >50% in bed, but not bedbound (Capable of only limited self-care, confined to bed or chair 50% or more of waking hours)  4 - Bedbound (Completely disabled. Cannot carry on any self-care. Totally confined to bed or chair)  5 - Death   Aurea Blossom MM, Creech RH, Tormey DC, et al. [phone]). "Toxicity and response criteria of the [facility]". Am. Hillard Lowes. Oncol. 5 (6): 649-55    LABORATORY DATA:  Lab Results  Component Value Date   WBC 12.7 (H) 06/06/2023   HGB 14.7 06/06/2023   HCT 46.6 06/06/2023   MCV 92 06/06/2023   PLT 214 06/06/2023   Lab Results  Component Value Date   NA 142 06/06/2023   K 4.8 06/06/2023   CL 100 06/06/2023   CO2 25 06/06/2023   Lab Results  Component Value Date   ALT 26 01/13/2023   AST 47 (H) 01/13/2023   ALKPHOS 63 01/13/2023   BILITOT 0.5 01/13/2023      RADIOGRAPHY: NM PET Image Initial (PI) Skull Base To Thigh Result Date: 01/29/2024 CLINICAL DATA:  Initial treatment strategy for lung nodule. EXAM: NUCLEAR MEDICINE PET SKULL BASE TO THIGH TECHNIQUE: 9.01 mCi F-18 FDG was injected intravenously. Full-ring PET imaging was performed from the skull base to thigh after the radiotracer. CT data was obtained and used for attenuation correction and anatomic localization. Fasting blood glucose: 141 mg/dl COMPARISON:  CT 96/01/5408.  PET-CT 04/12/2023 FINDINGS: Mediastinal blood pool activity: SUV max 3.2 Liver activity: SUV max 3.6 NECK: Mild uptake along the strap muscles in the right-side of the neck, physiologic. No specific  abnormal radiotracer uptake identified along lymph node change the neck including  submandibular, posterior triangle or internal jugular regions. Near symmetric uptake along the visualized intracranial compartment. Incidental CT findings: Visualized portions of the paranasal sinuses and mastoid air cells are clear. There is a rounded opacity along left maxillary sinus, possible small mucous retention cyst or polyp. The parotid glands, submandibular glands and thyroid glands unremarkable. Vascular calcifications are seen diffusely in the neck. CHEST: Left lower lobe superior segment lung nodule identified is hypermetabolic with maximum SUV value today of 11.2. On the remote PET-CT this had maximum SUV value of 11.2 as well. Sized to the lesion on the current exam on image 99 of the CT scan measures 2.9 by 2.6 cm. On the remote PET-CT diameter was 18 x 18 mm. This has some spiculations extending back to the hilum. On the prior PET-CT scan as well there is a small focus of uptake corresponding to a nodule in the superior segment of the right lower lobe medially. This is no longer identified. The nodular focus in this location with previously measured 15 mm and today is similar in size on image 108. This is a semi-solid nodule. Tiny nodule more peripheral in the right lower lobe on the prior examination measuring 4 mm is not well seen on the current examination. No specific abnormal uptake above mediastinal blood pool in the axillary regions, hilum or mediastinum. Incidental CT findings: Heart is nonenlarged. Coronary artery calcifications are seen. Calcifications along the mitral valve annulus. There are also calcifications for the aortic valve. Thoracic aorta has a normal caliber. Diffuse vascular calcifications along the aorta and branch vessels. Small hiatal hernia. Several small mediastinal nodes are identified. These are less than a cm short axis but larger than previous. Example left of the aorta has short axis of  8 mm. Again no significant abnormal uptake. This node has maximum SUV of 2.9, similar to blood pool. With the increase in size of these would recommend surveillance. ABDOMEN/PELVIS: There is physiologic distribution radiotracer along the parenchymal organs, bowel and renal collecting systems. Incidental CT findings: Stones in the gallbladder. No renal or ureteral stones identified. Preserved contour to the bladder. Lower pole right-sided renal cysts. The bowel has a normal course and caliber with scattered stool. Few scattered diverticula. Normal appendix scattered vascular calcifications. Underdistended stomach. SKELETON: No abnormal uptake seen along the visualized skeleton. There is some minimal uptake along a area of thickening along the subcutaneous fat lateral to the left shoulder. Nonspecific. Please correlate clinical findings. Area measures a proximally 1 cm in size and has SUV maximum of 2.8. Scattered degenerative changes along the spine and pelvis. Streak artifact related to the patient's dynamic right hip screw. IMPRESSION: Increasing size of hypermetabolic nodule in the superior segment left lower lobe now measuring up to 2.9 x 2.6 cm. Worrisome for neoplasm until proven otherwise. Previous nodule identified by with semi-solid in the superior segment of the right lower lobe is similar in size today by CT but does not show significant abnormal radiotracer uptake. Recommend continued CT surveillance of this lesion. Several small mediastinal nodes are identified which are larger than the prior PET-CT scan but do not have significant abnormal uptake above blood pool. These also can be assessed on follow-up surveillance. Electronically Signed   By: Adrianna Horde M.D.   On: 01/29/2024 13:50       IMPRESSION/PLAN: 1. Putative Stage IA3, cT1cN0M0, NSCLC of the LLL. Dr. Jeryl Moris discusses the patient's clinical situation and her prior imaging and describes that this appears to be consistent with a  malignant  process.  She is not a medical candidate to undergo bronchoscopy given her baseline lung function. Dr. Jeryl Moris reviews that the standard of care is for surgical resection. However for patients who are not medical candidates to undergo surgery, or who choose to forgo surgery, stereotactic body radiotherapy (SBRT) is an appropriate alternative.  We also discussed the limitations of clinically treating her as opposed to having definitive diagnosis from biopsy.  After weighing the risks and benefits, the patient is motivated to proceed understanding these limitations.  We discussed the risks, benefits, short, and long term effects of radiotherapy, as well as the curative intent, and the patient is interested in proceeding. Dr. Jeryl Moris discusses the delivery and logistics of radiotherapy and anticipates a course of 3-5 fractions of stereotactic radiotherapy. Written consent is obtained and placed in the chart, a copy was provided to the patient. The patient will be contacted to coordinate treatment planning by our simulation department.    In a visit lasting *** minutes, greater than 50% of the time was spent face to face discussing the patient's condition, in preparation for the discussion, and coordinating the patient's care.   The above documentation reflects my direct findings during this shared patient visit. Please see the separate note by Dr. Jeryl Moris on this date for the remainder of the patient's plan of care.    Shelvia Dick, [facility]   **Disclaimer: This note was dictated with voice recognition software. Similar sounding words can inadvertently be transcribed and this note may contain transcription errors which may not have been corrected upon publication of note.**

## 2024-02-20 ENCOUNTER — Ambulatory Visit
Admission: RE | Admit: 2024-02-20 | Discharge: 2024-02-20 | Disposition: A | Discharge status: Home / Self Care | Source: Ambulatory Visit | Attending: Radiation Oncology | Admitting: Radiation Oncology

## 2024-02-20 ENCOUNTER — Ambulatory Visit

## 2024-02-20 ENCOUNTER — Encounter: Payer: Self-pay | Admitting: Radiation Oncology

## 2024-02-20 VITALS — BP 144/44 | HR 63 | Temp 97.3°F | Resp 20 | Ht 62.0 in

## 2024-02-20 DIAGNOSIS — R911 Solitary pulmonary nodule: Secondary | ICD-10-CM | POA: Insufficient documentation

## 2024-02-20 DIAGNOSIS — F17210 Nicotine dependence, cigarettes, uncomplicated: Secondary | ICD-10-CM | POA: Insufficient documentation

## 2024-02-20 DIAGNOSIS — N281 Cyst of kidney, acquired: Secondary | ICD-10-CM | POA: Diagnosis not present

## 2024-02-20 DIAGNOSIS — I480 Paroxysmal atrial fibrillation: Secondary | ICD-10-CM | POA: Insufficient documentation

## 2024-02-20 DIAGNOSIS — J449 Chronic obstructive pulmonary disease, unspecified: Secondary | ICD-10-CM | POA: Insufficient documentation

## 2024-02-20 DIAGNOSIS — Z808 Family history of malignant neoplasm of other organs or systems: Secondary | ICD-10-CM | POA: Diagnosis not present

## 2024-02-20 DIAGNOSIS — I5032 Chronic diastolic (congestive) heart failure: Secondary | ICD-10-CM | POA: Diagnosis not present

## 2024-02-20 DIAGNOSIS — Z7982 Long term (current) use of aspirin: Secondary | ICD-10-CM | POA: Diagnosis not present

## 2024-02-20 DIAGNOSIS — Z7984 Long term (current) use of oral hypoglycemic drugs: Secondary | ICD-10-CM | POA: Insufficient documentation

## 2024-02-20 DIAGNOSIS — K449 Diaphragmatic hernia without obstruction or gangrene: Secondary | ICD-10-CM | POA: Diagnosis not present

## 2024-02-20 DIAGNOSIS — D751 Secondary polycythemia: Secondary | ICD-10-CM | POA: Insufficient documentation

## 2024-02-20 DIAGNOSIS — Z79899 Other long term (current) drug therapy: Secondary | ICD-10-CM | POA: Insufficient documentation

## 2024-02-20 DIAGNOSIS — Z7901 Long term (current) use of anticoagulants: Secondary | ICD-10-CM | POA: Diagnosis not present

## 2024-02-20 DIAGNOSIS — I252 Old myocardial infarction: Secondary | ICD-10-CM | POA: Diagnosis not present

## 2024-02-20 DIAGNOSIS — I110 Hypertensive heart disease with heart failure: Secondary | ICD-10-CM | POA: Insufficient documentation

## 2024-02-20 DIAGNOSIS — R918 Other nonspecific abnormal finding of lung field: Secondary | ICD-10-CM | POA: Insufficient documentation

## 2024-02-20 DIAGNOSIS — C3432 Malignant neoplasm of lower lobe, left bronchus or lung: Secondary | ICD-10-CM | POA: Insufficient documentation

## 2024-02-20 DIAGNOSIS — M810 Age-related osteoporosis without current pathological fracture: Secondary | ICD-10-CM | POA: Diagnosis not present

## 2024-02-20 DIAGNOSIS — E1151 Type 2 diabetes mellitus with diabetic peripheral angiopathy without gangrene: Secondary | ICD-10-CM | POA: Insufficient documentation

## 2024-02-20 DIAGNOSIS — D6869 Other thrombophilia: Secondary | ICD-10-CM | POA: Diagnosis not present

## 2024-02-20 DIAGNOSIS — I3481 Nonrheumatic mitral (valve) annulus calcification: Secondary | ICD-10-CM | POA: Diagnosis not present

## 2024-02-20 NOTE — Addendum Note (Signed)
 Encounter addended by: Giah Fickett M, RN on: 02/20/2024 2:29 PM  Actions taken: Patient Contact edited

## 2024-02-22 ENCOUNTER — Ambulatory Visit: Admitting: Emergency Medicine

## 2024-02-28 ENCOUNTER — Encounter: Payer: Self-pay | Admitting: Emergency Medicine

## 2024-02-28 ENCOUNTER — Ambulatory Visit: Admitting: Emergency Medicine

## 2024-03-01 ENCOUNTER — Telehealth: Payer: Self-pay

## 2024-03-01 NOTE — Telephone Encounter (Signed)
 Copied from CRM [id number]. Topic: General - Other >> Feb 27, 2024  5:07 PM Eveleen Hinds B wrote: Reason for CRM:  Care taker caalled to verify doctor appt for tomorrow. States patient will need someone to call "on call nurse" to set up a ride for patient at [phone]??  Care taker [phone].

## 2024-03-08 NOTE — Telephone Encounter (Signed)
 Received after patient's appointment, 5/14.  Patient was a no show.  Patient is now rescheduled for 03/19/24.   ATC "Caregiver" at [phone]", no answer.  LVM to return call.  When they call back, please advise that we do not arrange transportation for patients.  Either the patient, family member or caregiver will have to call to schedule transportation.

## 2024-03-12 ENCOUNTER — Ambulatory Visit
Admission: RE | Admit: 2024-03-12 | Discharge: 2024-03-12 | Disposition: A | Discharge status: Home / Self Care | Source: Ambulatory Visit | Attending: Radiation Oncology | Admitting: Radiation Oncology

## 2024-03-12 DIAGNOSIS — F17210 Nicotine dependence, cigarettes, uncomplicated: Secondary | ICD-10-CM | POA: Insufficient documentation

## 2024-03-12 DIAGNOSIS — R918 Other nonspecific abnormal finding of lung field: Secondary | ICD-10-CM | POA: Diagnosis present

## 2024-03-19 ENCOUNTER — Ambulatory Visit (INDEPENDENT_AMBULATORY_CARE_PROVIDER_SITE_OTHER): Admitting: Primary Care

## 2024-03-19 ENCOUNTER — Encounter: Payer: Self-pay | Admitting: Primary Care

## 2024-03-19 VITALS — BP 123/69 | HR 65 | Temp 97.4°F | Ht 62.0 in | Wt 182.6 lb

## 2024-03-19 DIAGNOSIS — R918 Other nonspecific abnormal finding of lung field: Secondary | ICD-10-CM

## 2024-03-19 DIAGNOSIS — F17210 Nicotine dependence, cigarettes, uncomplicated: Secondary | ICD-10-CM | POA: Diagnosis not present

## 2024-03-19 DIAGNOSIS — J9611 Chronic respiratory failure with hypoxia: Secondary | ICD-10-CM | POA: Diagnosis not present

## 2024-03-19 DIAGNOSIS — Z510 Encounter for antineoplastic radiation therapy: Secondary | ICD-10-CM | POA: Diagnosis present

## 2024-03-19 DIAGNOSIS — J449 Chronic obstructive pulmonary disease, unspecified: Secondary | ICD-10-CM | POA: Diagnosis not present

## 2024-03-19 DIAGNOSIS — C3432 Malignant neoplasm of lower lobe, left bronchus or lung: Secondary | ICD-10-CM | POA: Insufficient documentation

## 2024-03-19 NOTE — Patient Instructions (Addendum)
 -  LUNG CANCER, LEFT LOWER LUNG: You have a cancerous nodule in your left lung that has grown in size. You are scheduled to start a specialized radiation treatment called SBRT on June 9, which aims to shrink the nodule. You will have a total of five treatments and will follow up with a CT scan after the treatment is completed.  -CHRONIC OBSTRUCTIVE PULMONARY DISEASE (COPD): COPD is a chronic lung condition that makes it hard to breathe. Your condition is currently stable with the use of Trelegy and albuterol  inhalers. You have reduced your smoking, which is good, but it is important to continue to try to quit. You are using oxygen therapy at home and should check your oxygen levels while using 3 liters per minute both at rest and during activity. You can use Robitussin or guaifenesin  to help manage mucus, and you should only use antibiotics if you notice an increase in mucus production or a change in its color.  INSTRUCTIONS: Proceed with SBRT starting June 9. Follow up with radiation oncology for a post-treatment CT scan. Check your oxygen saturation on 3 liters per minute at rest and with exertion. Continue to use Robitussin or guaifenesin  for mucus management and avoid antibiotics unless there is increased mucus production or a change in its color.  Follow-up 3 months with Dr. Byrum

## 2024-03-19 NOTE — Telephone Encounter (Signed)
 I called and spoke with the pt's caregiver and confirmed that the pt has transportation to appt today with Beth. Nothing further needed.

## 2024-03-19 NOTE — Progress Notes (Signed)
 @Patient  ID: Deanna Petty, female    DOB: Jul 13, 1946, 78 y.o.   MRN: [id number]  No chief complaint on file.   Referring provider: Aleene Hurry*  HPI: 78 year old female, current everyday smoker.  Past medical history significant for aortic stenosis, coronary artery disease, chronic diastolic heart failure, hypertension, myocardial infarction, paroxysmal A-fib, COPD, chronic respiratory failure, lung cancer, type 2 diabetes, age-related osteoporosis, polycythemia.  03/19/2024- Interim hx  Discussed the use of AI scribe software for clinical note transcription with the patient, who gave verbal consent to proceed.  History of Present Illness   Deanna Petty is a 78 year old female with a lung mass and COPD who presents for follow-up after a PET scan.  She has a mass in her left lung, which was evaluated with a PET scan on April 14th, showing an increased size of the hypermetabolic nodule in the left lower lung measuring 2.9 by 2.6 centimeters. She has been referred to radiation oncology and has completed simulation for radiation treatment on May 27th.  She has a history of COPD and is currently using Trelegy and albuterol  inhalers. No changes in her breathing over the last two months and no recent bronchitis flare-ups, increased mucus production, or hemoptysis. She uses her albuterol  inhaler sometimes at night for wheezing, which helps alleviate the symptoms. She continues to smoke, but has reduced her intake to one to two cigarettes a day, down from two packs a week.   She uses supplemental oxygen and questions the necessity of five liters per minute. She has been able to decrease her oxygen to three liters at home, maintaining her oxygen saturation above 90%.  She experiences a daily cough, which initially led to testing for COVID-19 and flu, both of which were negative. Allergy medication was ineffective, but a course of antibiotics previously resolved the cough, although it  returned after the antibiotics were finished. She occasionally uses Robitussin to manage mucus.     No Known Allergies  Immunization History  Administered Date(s) Administered   Fluad Quad(high Dose 65+) 07/05/2020   Moderna Sars-Covid-2 Vaccination 03/27/2020, 04/24/2020   Pneumococcal Polysaccharide-23 07/07/2020    Past Medical History:  Diagnosis Date   Acute heart failure with preserved ejection fraction (HFpEF) (HCC) 01/15/2023   Acute on chronic diastolic CHF (congestive heart failure) (HCC) 07/07/2020   Age-related osteoporosis without current pathological fracture 10/03/2023   Angiopathy, diabetic (HCC) 10/03/2023   Bronchitis    Chronic respiratory failure with hypoxia (HCC) 05/24/2022   Cigarette smoker    COPD (chronic obstructive pulmonary disease) (HCC)    COPD exacerbation (HCC) 01/15/2022   COPD with acute exacerbation (HCC) 01/12/2023   Elevated brain natriuretic peptide (BNP) level 07/03/2020   Essential hypertension 01/15/2022   Hypercoagulable state due to paroxysmal atrial fibrillation (HCC) 01/18/2023   Hypokalemia 01/12/2023   Hypomagnesemia 01/16/2022   Left breast mass 05/14/2013   Lung nodule 08/17/2023   Myocardial infarction due to demand ischemia (HCC) 01/12/2023   New onset atrial fibrillation (HCC) 01/12/2023   Pain in right hip 10/03/2023   Peripheral vascular disease (HCC) 10/03/2023   Pneumonia    Polycythemia, secondary 07/07/2020   Screening for malignant neoplasm of colon 10/03/2023   Shortness of breath    with exertion; lasted used inhaler 06/02/13   Tobacco use 07/03/2020   Type 2 diabetes mellitus with hyperlipidemia (HCC) 07/07/2020    Tobacco History: Social History   Tobacco Use  Smoking Status Every Day   Current packs/day:  0.25   Average packs/day: 0.3 packs/day for 40.0 years (10.0 ttl pk-yrs)   Types: Cigarettes  Smokeless Tobacco Never  Tobacco Comments   Smokes 3cigs per day 01/25/24   Ready to quit: Not  Answered Counseling given: Not Answered Tobacco comments: Smokes 3cigs per day 01/25/24   Outpatient Medications Prior to Visit  Medication Sig Dispense Refill   acetaminophen  (TYLENOL ) 500 MG tablet Take 500 mg by mouth every 6 (six) hours as needed for mild pain (pain score 1-3) or moderate pain (pain score 4-6).     albuterol  (VENTOLIN  HFA) 108 (90 Base) MCG/ACT inhaler Inhale 1 puff into the lungs every 6 (six) hours as needed for wheezing or shortness of breath.     amLODipine  (NORVASC ) 10 MG tablet Take 10 mg by mouth daily.     apixaban  (ELIQUIS ) 5 MG TABS tablet Take 1 tablet (5 mg total) by mouth 2 (two) times daily. 60 tablet 2   apixaban  (ELIQUIS ) 5 MG TABS tablet Take 5 mg by mouth 2 (two) times daily.     ascorbic acid (VITAMIN C) 500 MG tablet Take 500 mg by mouth daily.     aspirin  EC 81 MG tablet Take 1 tablet (81 mg total) by mouth daily. Swallow whole. 30 tablet 2   bisoprolol  (ZEBETA ) 5 MG tablet Take 1 tablet (5 mg total) by mouth daily. 30 tablet 2   Calcium  Carb-Cholecalciferol (CALCIUM  + VITAMIN D3 PO) Take 1 tablet by mouth 2 (two) times daily.     cetirizine (ZYRTEC) 10 MG chewable tablet Chew 10 mg by mouth daily.     dapagliflozin  propanediol (FARXIGA ) 10 MG TABS tablet Take 1 tablet (10 mg total) by mouth daily. 30 tablet 2   fluticasone  (FLONASE ) 50 MCG/ACT nasal spray Place 1-2 sprays into both nostrils daily as needed for allergies.     Fluticasone -Umeclidin-Vilant (TRELEGY ELLIPTA ) 100-62.5-25 MCG/ACT AEPB Inhale 1 puff into the lungs daily. 28 each 5   furosemide  (LASIX ) 20 MG tablet Take 20 mg by mouth daily.     ipratropium-albuterol  (DUONEB) 0.5-2.5 (3) MG/3ML SOLN Take 3 mLs by nebulization as needed (wheezing or shortness of breath).     levalbuterol  (XOPENEX ) 0.63 MG/3ML nebulizer solution Take 3 mLs (0.63 mg total) by nebulization every 4 (four) hours as needed for wheezing or shortness of breath. 3 mL 12   losartan  (COZAAR ) 25 MG tablet Take 1 tablet (25  mg total) by mouth daily. 30 tablet 2   losartan  (COZAAR ) 50 MG tablet Take 50 mg by mouth daily.     metFORMIN  (GLUCOPHAGE ) 1000 MG tablet Take 1,000 mg by mouth daily.     metoprolol  succinate (TOPROL -XL) 25 MG 24 hr tablet Take 25 mg by mouth daily.     montelukast (SINGULAIR) 10 MG tablet Take 10 mg by mouth at bedtime.     Multiple Vitamins-Minerals (ONCOVITE PO) Take 1 tablet by mouth daily.     omeprazole (PRILOSEC) 20 MG capsule Take 20 mg by mouth daily.     rosuvastatin  (CRESTOR ) 5 MG tablet Take 5 mg by mouth daily.     No facility-administered medications prior to visit.   Review of Systems  Review of Systems  Constitutional: Negative.   Respiratory:  Positive for cough. Negative for shortness of breath and wheezing.   Cardiovascular: Negative.    Physical Exam  There were no vitals taken for this visit. Physical Exam Constitutional:      General: She is not in acute distress.  Appearance: Normal appearance. She is not ill-appearing.  HENT:     Head: Normocephalic and atraumatic.  Cardiovascular:     Rate and Rhythm: Normal rate and regular rhythm.  Pulmonary:     Breath sounds: Rales present. No wheezing or rhonchi.  Musculoskeletal:        General: Normal range of motion.  Skin:    General: Skin is warm and dry.  Neurological:     General: No focal deficit present.     Mental Status: She is alert and oriented to person, place, and time. Mental status is at baseline.  Psychiatric:        Mood and Affect: Mood normal.        Behavior: Behavior normal.        Thought Content: Thought content normal.        Judgment: Judgment normal.      Lab Results:  CBC    Component Value Date/Time   WBC 12.7 (H) 06/06/2023 1159   WBC 9.8 01/18/2023 0215   RBC 5.06 06/06/2023 1159   RBC 4.45 01/18/2023 0215   HGB 14.7 06/06/2023 1159   HCT 46.6 06/06/2023 1159   PLT 214 06/06/2023 1159   MCV 92 06/06/2023 1159   MCH 29.1 06/06/2023 1159   MCH 30.1 01/18/2023  0215   MCHC 31.5 06/06/2023 1159   MCHC 32.4 01/18/2023 0215   RDW 13.2 06/06/2023 1159   LYMPHSABS 0.6 (L) 01/12/2023 2258   MONOABS 0.0 (L) 01/12/2023 2258   EOSABS 0.0 01/12/2023 2258   BASOSABS 0.0 01/12/2023 2258    BMET    Component Value Date/Time   NA 142 06/06/2023 1159   K 4.8 06/06/2023 1159   CL 100 06/06/2023 1159   CO2 25 06/06/2023 1159   GLUCOSE 109 (H) 06/06/2023 1159   GLUCOSE 107 (H) 01/18/2023 0215   BUN 8 06/06/2023 1159   CREATININE 0.92 06/06/2023 1159   CALCIUM  10.8 (H) 06/06/2023 1159   GFRNONAA >60 01/18/2023 0215   GFRAA >60 07/06/2020 0513    BNP    Component Value Date/Time   BNP 241.7 (H) 01/17/2023 0641    ProBNP No results found for: "PROBNP"  Imaging: No results found.   Assessment & Plan:   No problem-specific Assessment & Plan notes found for this encounter.  Assessment and Plan    Lung cancer, left lower lung Lung cancer in the left lower lung with a hypermetabolic nodule measuring 2.9 by 2.6 cm, concerning for malignancy. Not a candidate for bronchoscopy biopsy. Radiation oncology consultation completed with Dr. Jeryl Moris. Simulation for radiation therapy done on May 27. Scheduled for SBRT starting June 9, with a total of five treatments planned. Goal is to shrink the nodule. - Proceed with SBRT starting June 9 - Follow up with radiation oncology for post-treatment CT scan  Chronic obstructive pulmonary disease (COPD) COPD managed with Trelegy and albuterol  inhalers. No recent exacerbations or changes in breathing. Uses albuterol  inhaler at night for wheezing, which helps. Continues to smoke, but has reduced to 1-2 cigarettes per day. Discussed risks of smoking on COPD and lung cancer. Aware of risks but values smoking as a remaining pleasure. Oxygen therapy currently at 5 liters, but has been using 3 liters at home with oxygen saturation above 90%. - Check oxygen saturation on 3 liters at rest and with exertion - Encourage use of  Robitussin or guaifenesin  for mucus management - Hold off on antibiotics unless there is increased mucus production or color change -  Discuss smoking cessation, but she prefers to continue smoking  Chronic respiratory failure Previously using 5L oxygen. Patient able to maintain O2 >95% on 3L at rest and with exertion, continue 3L oxygen 24/7      Antonio Baumgarten, NP 03/19/2024

## 2024-03-25 ENCOUNTER — Other Ambulatory Visit: Payer: Self-pay

## 2024-03-25 ENCOUNTER — Ambulatory Visit
Admission: RE | Admit: 2024-03-25 | Discharge: 2024-03-25 | Disposition: A | Discharge status: Home / Self Care | Source: Ambulatory Visit | Attending: Radiation Oncology | Admitting: Radiation Oncology

## 2024-03-25 DIAGNOSIS — Z510 Encounter for antineoplastic radiation therapy: Secondary | ICD-10-CM | POA: Diagnosis not present

## 2024-03-25 LAB — RAD ONC ARIA SESSION SUMMARY
Course Elapsed Days: 0
Plan Fractions Treated to Date: 1
Plan Prescribed Dose Per Fraction: 12 Gy
Plan Total Fractions Prescribed: 5
Plan Total Prescribed Dose: 60 Gy
Reference Point Dosage Given to Date: 12 Gy
Reference Point Session Dosage Given: 12 Gy
Session Number: 1

## 2024-03-26 ENCOUNTER — Ambulatory Visit: Admitting: Radiation Oncology

## 2024-03-26 DIAGNOSIS — Z510 Encounter for antineoplastic radiation therapy: Secondary | ICD-10-CM | POA: Diagnosis not present

## 2024-03-27 ENCOUNTER — Ambulatory Visit
Admission: RE | Admit: 2024-03-27 | Discharge: 2024-03-27 | Disposition: A | Discharge status: Home / Self Care | Source: Ambulatory Visit | Attending: Radiation Oncology | Admitting: Radiation Oncology

## 2024-03-27 ENCOUNTER — Other Ambulatory Visit: Payer: Self-pay

## 2024-03-27 DIAGNOSIS — Z510 Encounter for antineoplastic radiation therapy: Secondary | ICD-10-CM | POA: Diagnosis not present

## 2024-03-27 LAB — RAD ONC ARIA SESSION SUMMARY
Course Elapsed Days: 2
Plan Fractions Treated to Date: 1
Plan Prescribed Dose Per Fraction: 12 Gy
Plan Total Fractions Prescribed: 4
Plan Total Prescribed Dose: 48 Gy
Reference Point Dosage Given to Date: 24 Gy
Reference Point Session Dosage Given: 12 Gy
Session Number: 2

## 2024-03-28 ENCOUNTER — Ambulatory Visit: Admitting: Radiation Oncology

## 2024-03-29 ENCOUNTER — Ambulatory Visit
Admission: RE | Admit: 2024-03-29 | Discharge: 2024-03-29 | Disposition: A | Discharge status: Home / Self Care | Source: Ambulatory Visit | Attending: Radiation Oncology | Admitting: Radiation Oncology

## 2024-03-29 ENCOUNTER — Other Ambulatory Visit: Payer: Self-pay

## 2024-03-29 DIAGNOSIS — Z510 Encounter for antineoplastic radiation therapy: Secondary | ICD-10-CM | POA: Diagnosis not present

## 2024-03-29 LAB — RAD ONC ARIA SESSION SUMMARY
Course Elapsed Days: 4
Plan Fractions Treated to Date: 2
Plan Prescribed Dose Per Fraction: 12 Gy
Plan Total Fractions Prescribed: 4
Plan Total Prescribed Dose: 48 Gy
Reference Point Dosage Given to Date: 36 Gy
Reference Point Session Dosage Given: 12 Gy
Session Number: 3

## 2024-04-02 ENCOUNTER — Other Ambulatory Visit: Payer: Self-pay

## 2024-04-02 ENCOUNTER — Ambulatory Visit
Admission: RE | Admit: 2024-04-02 | Discharge: 2024-04-02 | Disposition: A | Discharge status: Home / Self Care | Source: Ambulatory Visit | Attending: Radiation Oncology | Admitting: Radiation Oncology

## 2024-04-02 DIAGNOSIS — Z510 Encounter for antineoplastic radiation therapy: Secondary | ICD-10-CM | POA: Diagnosis not present

## 2024-04-02 LAB — RAD ONC ARIA SESSION SUMMARY
Course Elapsed Days: 8
Plan Fractions Treated to Date: 3
Plan Prescribed Dose Per Fraction: 12 Gy
Plan Total Fractions Prescribed: 4
Plan Total Prescribed Dose: 48 Gy
Reference Point Dosage Given to Date: 48 Gy
Reference Point Session Dosage Given: 12 Gy
Session Number: 4

## 2024-04-04 ENCOUNTER — Ambulatory Visit

## 2024-04-04 ENCOUNTER — Other Ambulatory Visit: Payer: Self-pay

## 2024-04-04 ENCOUNTER — Ambulatory Visit
Admission: RE | Admit: 2024-04-04 | Discharge: 2024-04-04 | Disposition: A | Discharge status: Home / Self Care | Source: Ambulatory Visit | Attending: Radiation Oncology | Admitting: Radiation Oncology

## 2024-04-04 VITALS — BP 142/64 | HR 72 | Temp 97.2°F | Resp 20

## 2024-04-04 DIAGNOSIS — C3432 Malignant neoplasm of lower lobe, left bronchus or lung: Secondary | ICD-10-CM

## 2024-04-04 DIAGNOSIS — Z510 Encounter for antineoplastic radiation therapy: Secondary | ICD-10-CM | POA: Diagnosis not present

## 2024-04-04 LAB — RAD ONC ARIA SESSION SUMMARY
Course Elapsed Days: 10
Plan Fractions Treated to Date: 4
Plan Prescribed Dose Per Fraction: 12 Gy
Plan Total Fractions Prescribed: 4
Plan Total Prescribed Dose: 48 Gy
Reference Point Dosage Given to Date: 60 Gy
Reference Point Session Dosage Given: 12 Gy
Session Number: 5

## 2024-04-04 NOTE — Progress Notes (Signed)
  Radiation Oncology         ([phone]) [phone] ________________________________  Name: Deanna Petty MRN: [id number]  Date: 04/04/2024  DOB: [birth year]  End of Treatment Note  Diagnosis:   Putative Stage IA3, cT1cN0M0, NSCLC of the LLL.   Indication for treatment:  Curative       Radiation treatment dates:   03/25/24-04/04/24  Site/dose:   The tumor in the LLL was treated with a course of stereotactic body radiation treatment. The patient received 60 Gy In 35 fractions at 12 Gy per fraction.  Narrative: The patient tolerated radiation treatment relatively well.   The patient did not have any signs of acute toxicity during treatment.  Plan: The patient will receive a call in about one month from the radiation oncology department. She will continue follow up in our clinic and I'll order her next CT scan to be performed in 6-8 weeks.      Shelvia Dick, PAC

## 2024-04-05 NOTE — Radiation Completion Notes (Signed)
 Patient Name: Deanna Petty, Deanna Petty MRN: [id number] Date of Birth: 1946/09/15 Referring Physician: Racheal Buddle, M.D. Date of Service: 2024-04-05 Radiation Oncologist: Johna Myers, M.D. [facility] [place]                             RADIATION ONCOLOGY END OF TREATMENT NOTE     Diagnosis: C34.32 Malignant neoplasm of lower lobe, left bronchus or lung Intent: Curative     ==========DELIVERED PLANS==========  First Treatment Date: 2024-03-25 Last Treatment Date: 2024-04-04   Plan Name: Lung_L_SBRT Site: Lung, Left Technique: SBRT/SRT-IMRT Mode: Photon Dose Per Fraction: 12 Gy Prescribed Dose (Delivered / Prescribed): 12 Gy / 12 Gy Prescribed Fxs (Delivered / Prescribed): 1 / 1   Plan Name: Lung_L_SBRT:1 Site: Lung, Left Technique: SBRT/SRT-IMRT Mode: Photon Dose Per Fraction: 12 Gy Prescribed Dose (Delivered / Prescribed): 48 Gy / 48 Gy Prescribed Fxs (Delivered / Prescribed): 4 / 4     ==========ON TREATMENT VISIT DATES========== 2024-03-25, 2024-03-27, 2024-03-29, 2024-04-02, 2024-04-04     ==========UPCOMING VISITS==========       ==========APPENDIX - ON TREATMENT VISIT NOTES==========   See weekly On Treatment Notes in Epic for details in the Media tab (listed as Progress notes on the On Treatment Visit Dates listed above).

## 2024-04-11 ENCOUNTER — Emergency Department (HOSPITAL_COMMUNITY)

## 2024-04-11 ENCOUNTER — Inpatient Hospital Stay (HOSPITAL_COMMUNITY)
Admission: EM | Admit: 2024-04-11 | Discharge: 2024-04-16 | DRG: 640 | Disposition: A | Discharge status: Home Health | Attending: Internal Medicine | Admitting: Internal Medicine

## 2024-04-11 ENCOUNTER — Encounter (HOSPITAL_COMMUNITY): Payer: Self-pay

## 2024-04-11 DIAGNOSIS — N179 Acute kidney failure, unspecified: Secondary | ICD-10-CM

## 2024-04-11 DIAGNOSIS — I5032 Chronic diastolic (congestive) heart failure: Secondary | ICD-10-CM | POA: Diagnosis present

## 2024-04-11 DIAGNOSIS — Z7982 Long term (current) use of aspirin: Secondary | ICD-10-CM

## 2024-04-11 DIAGNOSIS — J9601 Acute respiratory failure with hypoxia: Secondary | ICD-10-CM | POA: Diagnosis not present

## 2024-04-11 DIAGNOSIS — D751 Secondary polycythemia: Secondary | ICD-10-CM | POA: Diagnosis present

## 2024-04-11 DIAGNOSIS — E66811 Obesity, class 1: Secondary | ICD-10-CM | POA: Diagnosis present

## 2024-04-11 DIAGNOSIS — M810 Age-related osteoporosis without current pathological fracture: Secondary | ICD-10-CM | POA: Diagnosis present

## 2024-04-11 DIAGNOSIS — F32A Depression, unspecified: Secondary | ICD-10-CM | POA: Diagnosis present

## 2024-04-11 DIAGNOSIS — R5383 Other fatigue: Secondary | ICD-10-CM | POA: Diagnosis not present

## 2024-04-11 DIAGNOSIS — I110 Hypertensive heart disease with heart failure: Secondary | ICD-10-CM | POA: Diagnosis present

## 2024-04-11 DIAGNOSIS — E860 Dehydration: Secondary | ICD-10-CM | POA: Diagnosis not present

## 2024-04-11 DIAGNOSIS — Z9981 Dependence on supplemental oxygen: Secondary | ICD-10-CM

## 2024-04-11 DIAGNOSIS — E871 Hypo-osmolality and hyponatremia: Secondary | ICD-10-CM

## 2024-04-11 DIAGNOSIS — D649 Anemia, unspecified: Secondary | ICD-10-CM | POA: Diagnosis present

## 2024-04-11 DIAGNOSIS — J449 Chronic obstructive pulmonary disease, unspecified: Secondary | ICD-10-CM | POA: Diagnosis present

## 2024-04-11 DIAGNOSIS — E1169 Type 2 diabetes mellitus with other specified complication: Secondary | ICD-10-CM

## 2024-04-11 DIAGNOSIS — I350 Nonrheumatic aortic (valve) stenosis: Secondary | ICD-10-CM | POA: Diagnosis present

## 2024-04-11 DIAGNOSIS — E785 Hyperlipidemia, unspecified: Secondary | ICD-10-CM

## 2024-04-11 DIAGNOSIS — I2510 Atherosclerotic heart disease of native coronary artery without angina pectoris: Secondary | ICD-10-CM | POA: Diagnosis present

## 2024-04-11 DIAGNOSIS — Z6832 Body mass index (BMI) 32.0-32.9, adult: Secondary | ICD-10-CM

## 2024-04-11 DIAGNOSIS — R531 Weakness: Principal | ICD-10-CM

## 2024-04-11 DIAGNOSIS — J9611 Chronic respiratory failure with hypoxia: Secondary | ICD-10-CM | POA: Diagnosis present

## 2024-04-11 DIAGNOSIS — I480 Paroxysmal atrial fibrillation: Secondary | ICD-10-CM

## 2024-04-11 DIAGNOSIS — Z7901 Long term (current) use of anticoagulants: Secondary | ICD-10-CM

## 2024-04-11 DIAGNOSIS — C3432 Malignant neoplasm of lower lobe, left bronchus or lung: Secondary | ICD-10-CM | POA: Diagnosis present

## 2024-04-11 DIAGNOSIS — E1151 Type 2 diabetes mellitus with diabetic peripheral angiopathy without gangrene: Secondary | ICD-10-CM | POA: Diagnosis present

## 2024-04-11 DIAGNOSIS — Z90710 Acquired absence of both cervix and uterus: Secondary | ICD-10-CM

## 2024-04-11 DIAGNOSIS — Z7951 Long term (current) use of inhaled steroids: Secondary | ICD-10-CM

## 2024-04-11 DIAGNOSIS — I10 Essential (primary) hypertension: Secondary | ICD-10-CM | POA: Diagnosis present

## 2024-04-11 DIAGNOSIS — Z79899 Other long term (current) drug therapy: Secondary | ICD-10-CM

## 2024-04-11 DIAGNOSIS — J189 Pneumonia, unspecified organism: Principal | ICD-10-CM | POA: Diagnosis present

## 2024-04-11 DIAGNOSIS — Z87891 Personal history of nicotine dependence: Secondary | ICD-10-CM

## 2024-04-11 DIAGNOSIS — Z833 Family history of diabetes mellitus: Secondary | ICD-10-CM

## 2024-04-11 DIAGNOSIS — Z7984 Long term (current) use of oral hypoglycemic drugs: Secondary | ICD-10-CM

## 2024-04-11 DIAGNOSIS — J440 Chronic obstructive pulmonary disease with acute lower respiratory infection: Secondary | ICD-10-CM | POA: Diagnosis present

## 2024-04-11 HISTORY — DX: Other fatigue: R53.83

## 2024-04-11 HISTORY — DX: Hypo-osmolality and hyponatremia: E87.1

## 2024-04-11 HISTORY — DX: Acute kidney failure, unspecified: N17.9

## 2024-04-11 LAB — URINALYSIS, ROUTINE W REFLEX MICROSCOPIC
Bilirubin Urine: NEGATIVE
Glucose, UA: >=500 mg/dL — AB
Hgb urine dipstick: NEGATIVE
Ketones, ur: NEGATIVE mg/dL
Leukocytes,Ua: NEGATIVE
Nitrite: NEGATIVE
Protein, ur: NEGATIVE mg/dL
Specific Gravity, Urine: 1.011 (ref 1.005–1.030)
pH: 5 (ref 5.0–8.0)

## 2024-04-11 LAB — BASIC METABOLIC PANEL WITH GFR
Anion gap: 13 (ref 5–15)
BUN: 15 mg/dL (ref 8–23)
CO2: 25 mmol/L (ref 22–32)
Calcium: 8.8 mg/dL — ABNORMAL LOW (ref 8.9–10.3)
Chloride: 93 mmol/L — ABNORMAL LOW (ref 98–111)
Creatinine, Ser: 1.09 mg/dL — ABNORMAL HIGH (ref 0.44–1.00)
GFR, Estimated: 53 mL/min — ABNORMAL LOW (ref >60)
Glucose, Bld: 142 mg/dL — ABNORMAL HIGH (ref 70–99)
Potassium: 4.2 mmol/L (ref 3.5–5.1)
Sodium: 131 mmol/L — ABNORMAL LOW (ref 135–145)

## 2024-04-11 LAB — CBC
HCT: 38 % (ref 36.0–46.0)
Hemoglobin: 11.8 g/dL — ABNORMAL LOW (ref 12.0–15.0)
MCH: 27.7 pg (ref 26.0–34.0)
MCHC: 31.1 g/dL (ref 30.0–36.0)
MCV: 89.2 fL (ref 80.0–100.0)
Platelets: 225 10*3/uL (ref 150–400)
RBC: 4.26 MIL/uL (ref 3.87–5.11)
RDW: 14.8 % (ref 11.5–15.5)
WBC: 10.9 10*3/uL — ABNORMAL HIGH (ref 4.0–10.5)
nRBC: 0.2 % (ref 0.0–0.2)

## 2024-04-11 LAB — I-STAT VENOUS BLOOD GAS, ED
Acid-Base Excess: 7 mmol/L — ABNORMAL HIGH (ref 0.0–2.0)
Bicarbonate: 33.4 mmol/L — ABNORMAL HIGH (ref 20.0–28.0)
Calcium, Ion: 1.12 mmol/L — ABNORMAL LOW (ref 1.15–1.40)
HCT: 37 % (ref 36.0–46.0)
Hemoglobin: 12.6 g/dL (ref 12.0–15.0)
O2 Saturation: 52 %
Potassium: 4.2 mmol/L (ref 3.5–5.1)
Sodium: 138 mmol/L (ref 135–145)
TCO2: 35 mmol/L — ABNORMAL HIGH (ref 22–32)
pCO2, Ven: 56.2 mmHg (ref 44–60)
pH, Ven: 7.382 (ref 7.25–7.43)
pO2, Ven: 29 mmHg — CL (ref 32–45)

## 2024-04-11 LAB — TROPONIN I (HIGH SENSITIVITY)
Troponin I (High Sensitivity): 20 ng/L — ABNORMAL HIGH (ref <18)
Troponin I (High Sensitivity): 19 ng/L — ABNORMAL HIGH (ref <18)

## 2024-04-11 MED ORDER — SODIUM CHLORIDE 0.9 % IV SOLN
2.0000 g | INTRAVENOUS | Status: DC
  Administered 2024-04-12 – 2024-04-15 (×4): 2 g via INTRAVENOUS
  Filled 2024-04-11 (×4): qty 20

## 2024-04-11 MED ORDER — AMLODIPINE BESYLATE 10 MG PO TABS
10.0000 mg | ORAL_TABLET | Freq: Every day | ORAL | Status: DC
  Administered 2024-04-12 – 2024-04-15 (×4): 10 mg via ORAL
  Filled 2024-04-11 (×4): qty 1
  Filled 2024-04-11: qty 2

## 2024-04-11 MED ORDER — ACETAMINOPHEN 650 MG RE SUPP
650.0000 mg | Freq: Four times a day (QID) | RECTAL | Status: DC | PRN
Start: 2024-04-11 — End: 2024-04-16

## 2024-04-11 MED ORDER — LACTATED RINGERS IV BOLUS
500.0000 mL | Freq: Once | INTRAVENOUS | Status: AC
  Administered 2024-04-11: 500 mL via INTRAVENOUS

## 2024-04-11 MED ORDER — LEVALBUTEROL HCL 0.63 MG/3ML IN NEBU
0.6300 mg | INHALATION_SOLUTION | RESPIRATORY_TRACT | Status: DC | PRN
  Administered 2024-04-13 – 2024-04-15 (×3): 0.63 mg via RESPIRATORY_TRACT
  Filled 2024-04-11 (×4): qty 3

## 2024-04-11 MED ORDER — ROSUVASTATIN CALCIUM 5 MG PO TABS
5.0000 mg | ORAL_TABLET | Freq: Every day | ORAL | Status: DC
  Administered 2024-04-12 – 2024-04-16 (×5): 5 mg via ORAL
  Filled 2024-04-11 (×5): qty 1

## 2024-04-11 MED ORDER — SODIUM CHLORIDE 0.9 % IV SOLN
1.0000 g | INTRAVENOUS | Status: DC
  Administered 2024-04-11: 1 g via INTRAVENOUS
  Filled 2024-04-11: qty 10

## 2024-04-11 MED ORDER — SODIUM CHLORIDE 0.9 % IV SOLN
500.0000 mg | INTRAVENOUS | Status: DC
  Administered 2024-04-13 – 2024-04-15 (×2): 500 mg via INTRAVENOUS
  Filled 2024-04-11 (×4): qty 5

## 2024-04-11 MED ORDER — APIXABAN 5 MG PO TABS
5.0000 mg | ORAL_TABLET | Freq: Two times a day (BID) | ORAL | Status: DC
  Administered 2024-04-12 – 2024-04-16 (×10): 5 mg via ORAL
  Filled 2024-04-11 (×10): qty 1

## 2024-04-11 MED ORDER — BUPROPION HCL ER (XL) 150 MG PO TB24
150.0000 mg | ORAL_TABLET | Freq: Every day | ORAL | Status: DC
  Administered 2024-04-12 – 2024-04-16 (×5): 150 mg via ORAL
  Filled 2024-04-11 (×5): qty 1

## 2024-04-11 MED ORDER — LACTATED RINGERS IV SOLN: INTRAVENOUS | Status: AC

## 2024-04-11 MED ORDER — ASPIRIN 81 MG PO TBEC
81.0000 mg | DELAYED_RELEASE_TABLET | Freq: Every day | ORAL | Status: DC
  Administered 2024-04-12 – 2024-04-16 (×5): 81 mg via ORAL
  Filled 2024-04-11 (×5): qty 1

## 2024-04-11 MED ORDER — ISOSORBIDE MONONITRATE ER 30 MG PO TB24
30.0000 mg | ORAL_TABLET | Freq: Every day | ORAL | Status: DC
  Administered 2024-04-12 – 2024-04-16 (×5): 30 mg via ORAL
  Filled 2024-04-11 (×5): qty 1

## 2024-04-11 MED ORDER — SODIUM CHLORIDE 0.9 % IV SOLN
500.0000 mg | Freq: Once | INTRAVENOUS | Status: AC
  Administered 2024-04-11: 500 mg via INTRAVENOUS
  Filled 2024-04-11: qty 5

## 2024-04-11 MED ORDER — ACETAMINOPHEN 325 MG PO TABS
650.0000 mg | ORAL_TABLET | Freq: Four times a day (QID) | ORAL | Status: DC | PRN
  Administered 2024-04-13 – 2024-04-16 (×3): 650 mg via ORAL
  Filled 2024-04-11 (×3): qty 2

## 2024-04-11 MED ORDER — BUDESON-GLYCOPYRROL-FORMOTEROL 160-9-4.8 MCG/ACT IN AERO
2.0000 | INHALATION_SPRAY | Freq: Two times a day (BID) | RESPIRATORY_TRACT | Status: DC
  Administered 2024-04-12 – 2024-04-16 (×6): 2 via RESPIRATORY_TRACT
  Filled 2024-04-11 (×2): qty 5.9

## 2024-04-11 NOTE — ED Notes (Signed)
 The patient's caregiver, Armandina would like an update on the pateint. She can be reached at [phone]

## 2024-04-11 NOTE — H&P (Signed)
 History and Physical    [name] FMW:[id number] DOB: 01-08-1946 DOA: 04/11/2024  Patient coming from: Home.  Chief Complaint: Weakness fatigue and shortness of breath.  HPI: Deanna Petty is a 78 y.o. female with history of COPD on 3 L oxygen, hypertension, paroxysmal A-fib, nonobstructive CAD, depression, recent diagnosis of lung cancer receiving radiation, last therapy on 04/04/2024 was brought to the ER after patient was becoming more weak fatigued and short of breath.  Patient's family also noticed that patient was getting confused.  Patient denies any chest pain or productive cough.  ED Course: In the ER VBG shows pH of 7.38 PCO2 of 56.2.  Creatinine of 1 troponins are 20 and 19 EKG shows normal sinus rhythm.  11.8.  WBC 10.9 with chest x-ray showing features concerning for possible pneumonia.  CT head was unremarkable.  Patient was empirically started on antibiotics for pneumonia.  IV fluids for dehydration.  Review of Systems: As per HPI, rest all negative.   Past Medical History:  Diagnosis Date   Acute heart failure with preserved ejection fraction (HFpEF) (HCC) 01/15/2023   Acute on chronic diastolic CHF (congestive heart failure) (HCC) 07/07/2020   Age-related osteoporosis without current pathological fracture 10/03/2023   Angiopathy, diabetic (HCC) 10/03/2023   Bronchitis    Chronic respiratory failure with hypoxia (HCC) 05/24/2022   Cigarette smoker    COPD (chronic obstructive pulmonary disease) (HCC)    COPD exacerbation (HCC) 01/15/2022   COPD with acute exacerbation (HCC) 01/12/2023   Elevated brain natriuretic peptide (BNP) level 07/03/2020   Essential hypertension 01/15/2022   Hypercoagulable state due to paroxysmal atrial fibrillation (HCC) 01/18/2023   Hypokalemia 01/12/2023   Hypomagnesemia 01/16/2022   Left breast mass 05/14/2013   Lung nodule 08/17/2023   Myocardial infarction due to demand ischemia (HCC) 01/12/2023   New onset atrial fibrillation (HCC)  01/12/2023   Pain in right hip 10/03/2023   Peripheral vascular disease (HCC) 10/03/2023   Pneumonia    Polycythemia, secondary 07/07/2020   Screening for malignant neoplasm of colon 10/03/2023   Shortness of breath    with exertion; lasted used inhaler 06/02/13   Tobacco use 07/03/2020   Type 2 diabetes mellitus with hyperlipidemia (HCC) 07/07/2020    Past Surgical History:  Procedure Laterality Date   ABDOMINAL HYSTERECTOMY     BREAST LUMPECTOMY WITH NEEDLE LOCALIZATION Left 06/12/2013   Procedure: LEFT BREAST LUMPECTOMY WITH NEEDLE LOCALIZATION;  Surgeon: Donnice POUR. Belinda, MD;  Location: [facility] OR;  Service: General;  Laterality: Left;   BREAST SURGERY     HIP FRACTURE SURGERY Right    LEFT HEART CATH AND CORONARY ANGIOGRAPHY N/A 01/17/2023   Procedure: LEFT HEART CATH AND CORONARY ANGIOGRAPHY;  Surgeon: Mady Bruckner, MD;  Location: [facility] INVASIVE [facility];  Service: Cardiovascular;  Laterality: N/A;     reports that she has been smoking cigarettes. She has a 10 pack-year smoking history. She has never used smokeless tobacco. She reports current alcohol use. She reports that she does not use drugs.  Deanna Known Allergies  Family History  Problem Relation Age of Onset   Cancer Mother        oral, brain   Diabetes Father     Prior to Admission medications   Medication Sig Start Date End Date Taking? Authorizing Provider  acetaminophen  (TYLENOL ) 500 MG tablet Take 500 mg by mouth every 6 (six) hours as needed for mild pain (pain score 1-3) or moderate pain (pain score 4-6).   Yes [provider]  ALPRAZolam (XANAX) 0.5 MG tablet Take 0.5 mg by mouth at bedtime as needed for anxiety.   Yes [provider]  amLODipine  (NORVASC ) 10 MG tablet Take 10 mg by mouth daily. 11/09/21  Yes [provider]  apixaban  (ELIQUIS ) 5 MG TABS tablet Take 1 tablet (5 mg total) by mouth 2 (two) times daily. 01/18/23  Yes Cherlyn Labella, MD  aspirin  EC 81 MG tablet Take 1 tablet (81 mg  total) by mouth daily. Swallow whole. 01/19/23  Yes Akula, Vijaya, MD  buPROPion  (WELLBUTRIN  XL) 150 MG 24 hr tablet Take 150 mg by mouth daily.   Yes [provider]  Calcium  Carb-Cholecalciferol (CALCIUM  + VITAMIN D3 PO) Take 1 tablet by mouth 2 (two) times daily.   Yes [provider]  cyclobenzaprine (FLEXERIL) 10 MG tablet Take 10 mg by mouth 3 (three) times daily as needed for muscle spasms.   Yes [provider]  dapagliflozin  propanediol (FARXIGA ) 10 MG TABS tablet Take 1 tablet (10 mg total) by mouth daily. 01/19/23  Yes Akula, Vijaya, MD  Fluticasone -Umeclidin-Vilant (TRELEGY ELLIPTA ) 100-62.5-25 MCG/ACT AEPB Inhale 1 puff into the lungs daily. 01/14/23  Yes Gonfa, Taye T, MD  furosemide  (LASIX ) 40 MG tablet Take 40 mg by mouth daily.   Yes [provider]  isosorbide  mononitrate (IMDUR ) 30 MG 24 hr tablet Take 30 mg by mouth daily.   Yes [provider]  levalbuterol  (XOPENEX ) 0.63 MG/3ML nebulizer solution Take 3 mLs (0.63 mg total) by nebulization every 4 (four) hours as needed for wheezing or shortness of breath. 02/09/24  Yes Byrum, Lamar RAMAN, MD  losartan  (COZAAR ) 25 MG tablet Take 1 tablet (25 mg total) by mouth daily. 01/19/23  Yes Akula, Vijaya, MD  metFORMIN  (GLUCOPHAGE ) 1000 MG tablet Take 1,000 mg by mouth daily.   Yes [provider]  rosuvastatin  (CRESTOR ) 5 MG tablet Take 5 mg by mouth daily.   Yes [provider]  albuterol  (VENTOLIN  HFA) 108 (90 Base) MCG/ACT inhaler Inhale 1 puff into the lungs every 6 (six) hours as needed for wheezing or shortness of breath. 01/25/23   [provider]  ascorbic acid (VITAMIN C) 500 MG tablet Take 500 mg by mouth daily. Patient not taking: Reported on 04/11/2024    [provider]  bisoprolol  (ZEBETA ) 5 MG tablet Take 1 tablet (5 mg total) by mouth daily. Patient not taking: Reported on 04/11/2024 01/19/23   Akula, Vijaya, MD  cetirizine (ZYRTEC) 10 MG chewable tablet  Chew 10 mg by mouth daily.    [provider]  fluticasone  (FLONASE ) 50 MCG/ACT nasal spray Place 1-2 sprays into both nostrils daily as needed for allergies.    [provider]  metoprolol  succinate (TOPROL -XL) 25 MG 24 hr tablet Take 25 mg by mouth daily.    [provider]    Physical Exam: Constitutional: Moderately built and nourished. Vitals:   04/11/24 1603 04/11/24 1903 04/11/24 1908 04/11/24 2001  BP: (!) 106/32 (!) 139/54    Pulse: 73 83    Resp: (!) 21 (!) 24    Temp: 97.9 F (36.6 C)   97.9 F (36.6 C)  TempSrc: Oral   Oral  SpO2: 91% 91%    Weight:   81.6 kg   Height:   5' 2 (1.575 m)    Eyes: Anicteric Deanna pallor. ENMT: Deanna discharge from the ears eyes nose normal. Neck: Deanna mass felt.  Deanna neck rigidity. Respiratory: Deanna rhonchi or crepitations. Cardiovascular: S1 S2  heard. Abdomen: Soft nontender bowel sound present. Musculoskeletal: Deanna edema. Skin: Deanna rash. Neurologic: Alert awake active appears fatigued and weak.  Moving all extremities.  Oriented to time place and person. Psychiatric: Appears weak.   Labs on Admission: I have personally reviewed following labs and imaging studies  CBC: Recent Labs  Lab 04/11/24 1540 04/11/24 1811  WBC 10.9*  --   HGB 11.8* 12.6  HCT 38.0 37.0  MCV 89.2  --   PLT 225  --    Basic Metabolic Panel: Recent Labs  Lab 04/11/24 1540 04/11/24 1811  NA 131* 138  K 4.2 4.2  CL 93*  --   CO2 25  --   GLUCOSE 142*  --   BUN 15  --   CREATININE 1.09*  --   CALCIUM  8.8*  --    GFR: Estimated Creatinine Clearance: 43.5 mL/min (A) (by C-G formula based on SCr of 1.09 mg/dL (H)). Liver Function Tests: Deanna results for input(s): AST, ALT, ALKPHOS, BILITOT, PROT, ALBUMIN in the last 168 hours. Deanna results for input(s): LIPASE, AMYLASE in the last 168 hours. Deanna results for input(s): AMMONIA in the last 168 hours. Coagulation Profile: Deanna results for input(s): INR, PROTIME in the  last 168 hours. Cardiac Enzymes: Deanna results for input(s): CKTOTAL, CKMB, CKMBINDEX, TROPONINI in the last 168 hours. BNP (last 3 results) Deanna results for input(s): PROBNP in the last 8760 hours. HbA1C: Deanna results for input(s): HGBA1C in the last 72 hours. CBG: Deanna results for input(s): GLUCAP in the last 168 hours. Lipid Profile: Deanna results for input(s): CHOL, HDL, LDLCALC, TRIG, CHOLHDL, LDLDIRECT in the last 72 hours. Thyroid Function Tests: Deanna results for input(s): TSH, T4TOTAL, FREET4, T3FREE, THYROIDAB in the last 72 hours. Anemia Panel: Deanna results for input(s): VITAMINB12, FOLATE, FERRITIN, TIBC, IRON, RETICCTPCT in the last 72 hours. Urine analysis:    Component Value Date/Time   COLORURINE YELLOW 04/11/2024 1801   APPEARANCEUR CLEAR 04/11/2024 1801   LABSPEC 1.011 04/11/2024 1801   PHURINE 5.0 04/11/2024 1801   GLUCOSEU >=500 (A) 04/11/2024 1801   HGBUR NEGATIVE 04/11/2024 1801   BILIRUBINUR NEGATIVE 04/11/2024 1801   KETONESUR NEGATIVE 04/11/2024 1801   PROTEINUR NEGATIVE 04/11/2024 1801   NITRITE NEGATIVE 04/11/2024 1801   LEUKOCYTESUR NEGATIVE 04/11/2024 1801   Sepsis Labs: @LABRCNTIP (procalcitonin:4,lacticidven:4) )Deanna results found for this or any previous visit (from the past 240 hours).   Radiological Exams on Admission: CT Head Wo Contrast Result Date: 04/11/2024 CLINICAL DATA:  Chief complaints Weakness CT Head Wo Contrast Head trauma, minor (Age >= 65y) EXAM: CT HEAD WITHOUT CONTRAST TECHNIQUE: Contiguous axial images were obtained from the base of the skull through the vertex without intravenous contrast. RADIATION DOSE REDUCTION: This exam was performed according to the departmental dose-optimization program which includes automated exposure control, adjustment of the mA and/or kV according to patient size and/or use of iterative reconstruction technique. COMPARISON:  CT head 12/30/2020 FINDINGS: Brain: Patchy and  confluent areas of decreased attenuation are noted throughout the deep and periventricular white matter of the cerebral hemispheres bilaterally, compatible with chronic microvascular ischemic disease. Deanna evidence of large-territorial acute infarction. Deanna parenchymal hemorrhage. Deanna mass lesion. Deanna extra-axial collection. Deanna mass effect or midline shift. Deanna hydrocephalus. Basilar cisterns are patent. Vascular: Deanna hyperdense vessel. Atherosclerotic calcifications are present within the cavernous internal carotid arteries. Skull: Deanna acute fracture or focal lesion. Sinuses/Orbits: Left maxillary, sphenoid, bilateral ethmoid sinus mucosal thickening. Paranasal sinuses and mastoid air cells are clear. Bilateral lens replacement. Otherwise  the orbits are unremarkable. Other: None. IMPRESSION: Deanna acute intracranial abnormality. Electronically Signed   By: Morgane  Naveau M.D.   On: 04/11/2024 19:03   DG Chest 2 View Result Date: 04/11/2024 CLINICAL DATA:  Shortness of breath EXAM: CHEST - 2 VIEW COMPARISON:  Chest x-ray 12/14/2023. CT abdomen and pelvis 01/11/2024. FINDINGS: There are patchy airspace opacities in the right upper lobe. Faint focal mass in the superior segment of the left lower lobe is present, grossly unchanged. There is Deanna pleural effusion or pneumothorax. The cardiomediastinal silhouette is stable. Deanna acute fractures are seen. IMPRESSION: 1. Patchy airspace opacities in the right upper lobe, suspicious for pneumonia. Follow-up imaging recommended in 4-6 weeks to confirm resolution. 2. Unchanged left lower lobe nodule. Electronically Signed   By: Greig Pique M.D.   On: 04/11/2024 16:31    EKG: Independently reviewed.  Sinus rhythm RBBB.  Assessment/Plan Principal Problem:   Fatigue Active Problems:   Type 2 diabetes mellitus with hyperlipidemia (HCC)   Polycythemia, secondary   COPD (chronic obstructive pulmonary disease) (HCC)   Essential hypertension   Paroxysmal atrial fibrillation  [facility]), diagnosed March 2024, CHADS2 Vascor 6, on anticoagulation with Eliquis    CAD (coronary artery disease), last April 2024, distal LCx 70% stenosis small vessel for intervention, on medical therapy   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)   ARF (acute renal failure) (HCC)   Hyponatremia    Fatigue and weakness may be related to radiation patient also appears dehydrated.  Will gently hydrate.  Check MRI brain 2D echo.  Check CK TSH ammonia levels. Possible pneumonia on empiric antibiotics. Hypertension will continue amlodipine  and Imdur  hold losartan  due to mild increase in creatinine. Acute renal failure with hyponatremia with last creatinine in 2023 was normal.  Gently hydrate hold Cozaar  and Lasix .  Follow metabolic panel. Paroxysmal atrial fibrillation on Eliquis .  Patient is not taking any rate limiting medications History of nonobstructive CAD on aspirin  statins and Imdur . Anemia will follow CBC check anemia panel. Lung cancer receiving radiation therapy. COPD on 3 L oxygen not actively wheezing.  Continue Breztri and Xopenex . Depression on Wellbutrin .  Hold Xanax. Hyperlipidemia on statins. Diabetes mellitus type 2 takes metformin  and Farxiga .  On sliding scale coverage.  Check hemoglobin A1c.  Since patient has possible pneumonia with generalized weakness will need close monitoring and further workup and more than 2 midnight stay.   DVT prophylaxis: Eliquis . Code Status: Full code. Family Communication: Discussed with patient. Disposition Plan: Monitored bed. Consults called: Physical therapy. Admission status: Observation.

## 2024-04-11 NOTE — ED Notes (Signed)
 pt's son called asking for update. Rian Koon: [phone]  EDP notified

## 2024-04-11 NOTE — ED Triage Notes (Signed)
 Pt is coming in for generalized weakness x 1 week post fall. Daughter mentions she has been 88% spo2, she has been altered and weak. She does having some obv shortness of breath.  Medications  5mg  total douned 125mg  solumedrol   106/60 70hr 157bgl 24rr  18g rt forearm

## 2024-04-11 NOTE — ED Provider Notes (Signed)
 [place] EMERGENCY DEPARTMENT AT [facility] Provider Note   CSN: [id number] Arrival date & time: 04/11/24  1536     History Chief Complaint  Patient presents with   Weakness    Deanna Petty is a 78 y.o. female w/ PMHx  aortic stenosis, coronary artery disease, chronic diastolic heart failure, hypertension, myocardial infarction, paroxysmal A-fib, COPD 3L [place], chronic respiratory failure, lung cancer, type 2 diabetes, age-related osteoporosis, polycythemia  who presents to the ED for evaluation of generalized weakness altered mental status and hypoxia.  Patient states she generally feels unwell.  Patient very confused.  Poor historian overall.  Contacted her caregiver [name] who states that she had a fall approximately 1 week ago.  She states since then she has  been generally weak.  She reports last night she was hallucinating and was very confused.  She also notes that her oxygen level has been approximately 88% on her baseline 3 L which is lower than her normal 92%.  [name] also states that she has received radiation treatment recently. Patient received DuoNeb treatments and Solu-Medrol  with EMS.  Physical Exam Updated Vital Signs BP (!) 139/54   Pulse 83   Temp 97.9 F (36.6 C) (Oral)   Resp (!) 24   Ht 5' 2 (1.575 m)   Wt 81.6 kg   SpO2 91%   BMI 32.92 kg/m  Physical Exam Vitals and nursing note reviewed.  Constitutional:      General: She is not in acute distress.    Appearance: She is well-developed. She is obese.  HENT:     Head: Normocephalic and atraumatic.   Eyes:     Extraocular Movements: Extraocular movements intact.     Conjunctiva/sclera: Conjunctivae normal.     Pupils: Pupils are equal, round, and reactive to light.    Cardiovascular:     Rate and Rhythm: Normal rate and regular rhythm.     Pulses: Normal pulses.     Heart sounds: Normal heart sounds. No murmur heard. Pulmonary:     Effort: Pulmonary effort is normal. No respiratory  distress.     Breath sounds: Wheezing present.  Abdominal:     Palpations: Abdomen is soft.     Tenderness: There is no abdominal tenderness.   Musculoskeletal:        General: No swelling.     Cervical back: Neck supple.   Skin:    General: Skin is warm and dry.     Capillary Refill: Capillary refill takes less than 2 seconds.   Neurological:     Mental Status: She is alert. She is disoriented.   Psychiatric:        Mood and Affect: Mood normal.     ED Results / Procedures / Treatments   Labs (all labs ordered are listed, but only abnormal results are displayed) Labs Reviewed  BASIC METABOLIC PANEL WITH GFR - Abnormal; Notable for the following components:      Result Value   Sodium 131 (*)    Chloride 93 (*)    Glucose, Bld 142 (*)    Creatinine, Ser 1.09 (*)    Calcium  8.8 (*)    GFR, Estimated 53 (*)    All other components within normal limits  CBC - Abnormal; Notable for the following components:   WBC 10.9 (*)    Hemoglobin 11.8 (*)    All other components within normal limits  URINALYSIS, ROUTINE W REFLEX MICROSCOPIC - Abnormal; Notable for the following components:  Glucose, UA >=500 (*)    Bacteria, UA RARE (*)    All other components within normal limits  I-STAT VENOUS BLOOD GAS, ED - Abnormal; Notable for the following components:   pO2, Ven 29 (*)    Bicarbonate 33.4 (*)    TCO2 35 (*)    Acid-Base Excess 7.0 (*)    Calcium , Ion 1.12 (*)    All other components within normal limits  TROPONIN I (HIGH SENSITIVITY) - Abnormal; Notable for the following components:   Troponin I (High Sensitivity) 20 (*)    All other components within normal limits  TROPONIN I (HIGH SENSITIVITY) - Abnormal; Notable for the following components:   Troponin I (High Sensitivity) 19 (*)    All other components within normal limits    EKG None  Radiology CT Head Wo Contrast Result Date: 04/11/2024 CLINICAL DATA:  Chief complaints Weakness CT Head Wo Contrast Head  trauma, minor (Age >= 65y) EXAM: CT HEAD WITHOUT CONTRAST TECHNIQUE: Contiguous axial images were obtained from the base of the skull through the vertex without intravenous contrast. RADIATION DOSE REDUCTION: This exam was performed according to the departmental dose-optimization program which includes automated exposure control, adjustment of the mA and/or kV according to patient size and/or use of iterative reconstruction technique. COMPARISON:  CT head 12/30/2020 FINDINGS: Brain: Patchy and confluent areas of decreased attenuation are noted throughout the deep and periventricular white matter of the cerebral hemispheres bilaterally, compatible with chronic microvascular ischemic disease. No evidence of large-territorial acute infarction. No parenchymal hemorrhage. No mass lesion. No extra-axial collection. No mass effect or midline shift. No hydrocephalus. Basilar cisterns are patent. Vascular: No hyperdense vessel. Atherosclerotic calcifications are present within the cavernous internal carotid arteries. Skull: No acute fracture or focal lesion. Sinuses/Orbits: Left maxillary, sphenoid, bilateral ethmoid sinus mucosal thickening. Paranasal sinuses and mastoid air cells are clear. Bilateral lens replacement. Otherwise the orbits are unremarkable. Other: None. IMPRESSION: No acute intracranial abnormality. Electronically Signed   By: Morgane  Naveau M.D.   On: 04/11/2024 19:03   DG Chest 2 View Result Date: 04/11/2024 CLINICAL DATA:  Shortness of breath EXAM: CHEST - 2 VIEW COMPARISON:  Chest x-ray 12/14/2023. CT abdomen and pelvis 01/11/2024. FINDINGS: There are patchy airspace opacities in the right upper lobe. Faint focal mass in the superior segment of the left lower lobe is present, grossly unchanged. There is no pleural effusion or pneumothorax. The cardiomediastinal silhouette is stable. No acute fractures are seen. IMPRESSION: 1. Patchy airspace opacities in the right upper lobe, suspicious for  pneumonia. Follow-up imaging recommended in 4-6 weeks to confirm resolution. 2. Unchanged left lower lobe nodule. Electronically Signed   By: Greig Pique M.D.   On: 04/11/2024 16:31    Medications Ordered in ED Medications  cefTRIAXone  (ROCEPHIN ) 1 g in sodium chloride  0.9 % 100 mL IVPB (0 g Intravenous Stopped 04/11/24 1938)  azithromycin  (ZITHROMAX ) 500 mg in sodium chloride  0.9 % 250 mL IVPB (500 mg Intravenous New Bag/Given 04/11/24 1906)  lactated ringers  bolus 500 mL (500 mLs Intravenous New Bag/Given 04/11/24 1940)    ED Course/ Medical Decision Making/ A&P  Deanna Petty is a 78 y.o. female presents as detailed above  Differential ddx: COPD exacerbation, hypercapnia, pneumonia, UTI, dehydration, intracranial abnormality  On arrival, patient afebrile hemodynamically stable no hypoxia or respiratory distress.  ED Work-up: Please see details of labs and imaging listed above. Chest x-ray concerning for right upper lobe pneumonia.  No evidence of acute infection on urinalysis.  Mild  increase in creatinine.  Patient does appear slightly dehydrated.  Patient received 500 cc fluid bolus in community for pneumonia antibiotics. Due to confusion and inability to complete feels patient would best be served inpatient.   Overall impression pneumonia, dehydration Patient will be admitted for further evaluation and treatment. Patient seen with supervising physician who agrees with plan.  Final Clinical Impression(s) / ED Diagnoses Final diagnoses:  Weakness  Dehydration  Community acquired pneumonia of right upper lobe of lung    Waddell Seats, DO PGY-3 Emergency Medicine    Seats Waddell, DO 04/11/24 2023    Francesca Elsie CROME, MD 04/11/24 [phone]

## 2024-04-12 ENCOUNTER — Observation Stay (HOSPITAL_COMMUNITY)

## 2024-04-12 ENCOUNTER — Inpatient Hospital Stay (HOSPITAL_COMMUNITY)

## 2024-04-12 ENCOUNTER — Other Ambulatory Visit: Payer: Self-pay

## 2024-04-12 DIAGNOSIS — F32A Depression, unspecified: Secondary | ICD-10-CM | POA: Diagnosis present

## 2024-04-12 DIAGNOSIS — E1151 Type 2 diabetes mellitus with diabetic peripheral angiopathy without gangrene: Secondary | ICD-10-CM | POA: Diagnosis present

## 2024-04-12 DIAGNOSIS — R0602 Shortness of breath: Secondary | ICD-10-CM

## 2024-04-12 DIAGNOSIS — R531 Weakness: Secondary | ICD-10-CM | POA: Diagnosis not present

## 2024-04-12 DIAGNOSIS — J9601 Acute respiratory failure with hypoxia: Secondary | ICD-10-CM | POA: Diagnosis not present

## 2024-04-12 DIAGNOSIS — E871 Hypo-osmolality and hyponatremia: Secondary | ICD-10-CM | POA: Diagnosis present

## 2024-04-12 DIAGNOSIS — R5383 Other fatigue: Secondary | ICD-10-CM | POA: Diagnosis present

## 2024-04-12 DIAGNOSIS — I5032 Chronic diastolic (congestive) heart failure: Secondary | ICD-10-CM | POA: Diagnosis present

## 2024-04-12 DIAGNOSIS — Z7901 Long term (current) use of anticoagulants: Secondary | ICD-10-CM | POA: Diagnosis not present

## 2024-04-12 DIAGNOSIS — D649 Anemia, unspecified: Secondary | ICD-10-CM | POA: Diagnosis present

## 2024-04-12 DIAGNOSIS — I2510 Atherosclerotic heart disease of native coronary artery without angina pectoris: Secondary | ICD-10-CM | POA: Diagnosis present

## 2024-04-12 DIAGNOSIS — I110 Hypertensive heart disease with heart failure: Secondary | ICD-10-CM | POA: Diagnosis present

## 2024-04-12 DIAGNOSIS — J440 Chronic obstructive pulmonary disease with acute lower respiratory infection: Secondary | ICD-10-CM | POA: Diagnosis present

## 2024-04-12 DIAGNOSIS — E785 Hyperlipidemia, unspecified: Secondary | ICD-10-CM | POA: Diagnosis present

## 2024-04-12 DIAGNOSIS — Z6832 Body mass index (BMI) 32.0-32.9, adult: Secondary | ICD-10-CM | POA: Diagnosis not present

## 2024-04-12 DIAGNOSIS — E66811 Obesity, class 1: Secondary | ICD-10-CM | POA: Diagnosis present

## 2024-04-12 DIAGNOSIS — C3432 Malignant neoplasm of lower lobe, left bronchus or lung: Secondary | ICD-10-CM | POA: Diagnosis present

## 2024-04-12 DIAGNOSIS — J189 Pneumonia, unspecified organism: Secondary | ICD-10-CM | POA: Diagnosis present

## 2024-04-12 DIAGNOSIS — N179 Acute kidney failure, unspecified: Secondary | ICD-10-CM | POA: Diagnosis present

## 2024-04-12 DIAGNOSIS — E860 Dehydration: Secondary | ICD-10-CM | POA: Diagnosis present

## 2024-04-12 DIAGNOSIS — M810 Age-related osteoporosis without current pathological fracture: Secondary | ICD-10-CM | POA: Diagnosis present

## 2024-04-12 DIAGNOSIS — I480 Paroxysmal atrial fibrillation: Secondary | ICD-10-CM | POA: Diagnosis present

## 2024-04-12 DIAGNOSIS — E1169 Type 2 diabetes mellitus with other specified complication: Secondary | ICD-10-CM | POA: Diagnosis present

## 2024-04-12 DIAGNOSIS — Z9981 Dependence on supplemental oxygen: Secondary | ICD-10-CM | POA: Diagnosis not present

## 2024-04-12 DIAGNOSIS — I350 Nonrheumatic aortic (valve) stenosis: Secondary | ICD-10-CM | POA: Diagnosis present

## 2024-04-12 DIAGNOSIS — D751 Secondary polycythemia: Secondary | ICD-10-CM | POA: Diagnosis present

## 2024-04-12 LAB — IRON AND TIBC
Iron: 29 ug/dL (ref 28–170)
Saturation Ratios: 8 % — ABNORMAL LOW (ref 10.4–31.8)
TIBC: 347 ug/dL (ref 250–450)
UIBC: 318 ug/dL

## 2024-04-12 LAB — COMPREHENSIVE METABOLIC PANEL WITH GFR
ALT: 14 U/L (ref 0–44)
AST: 17 U/L (ref 15–41)
Albumin: 2.7 g/dL — ABNORMAL LOW (ref 3.5–5.0)
Alkaline Phosphatase: 73 U/L (ref 38–126)
Anion gap: 14 (ref 5–15)
BUN: 20 mg/dL (ref 8–23)
CO2: 26 mmol/L (ref 22–32)
Calcium: 9.2 mg/dL (ref 8.9–10.3)
Chloride: 97 mmol/L — ABNORMAL LOW (ref 98–111)
Creatinine, Ser: 1.07 mg/dL — ABNORMAL HIGH (ref 0.44–1.00)
GFR, Estimated: 54 mL/min — ABNORMAL LOW (ref >60)
Glucose, Bld: 160 mg/dL — ABNORMAL HIGH (ref 70–99)
Potassium: 4.6 mmol/L (ref 3.5–5.1)
Sodium: 137 mmol/L (ref 135–145)
Total Bilirubin: 0.5 mg/dL (ref 0.0–1.2)
Total Protein: 7.2 g/dL (ref 6.5–8.1)

## 2024-04-12 LAB — FOLATE: Folate: 15 ng/mL (ref >5.9)

## 2024-04-12 LAB — ECHOCARDIOGRAM COMPLETE
AR max vel: 1.21 cm2
AV Area VTI: 1.21 cm2
AV Area mean vel: 1.1 cm2
AV Mean grad: 21.5 mmHg
AV Peak grad: 34.3 mmHg
Ao pk vel: 2.93 m/s
Area-P 1/2: 5.42 cm2
Est EF: >75
Height: 62 in
MV VTI: 1.91 cm2
S' Lateral: 3 cm
Weight: 2880 [oz_av]

## 2024-04-12 LAB — FERRITIN: Ferritin: 53 ng/mL (ref 11–307)

## 2024-04-12 LAB — RETICULOCYTES
Immature Retic Fract: 29.3 % — ABNORMAL HIGH (ref 2.3–15.9)
RBC.: 4.39 MIL/uL (ref 3.87–5.11)
Retic Count, Absolute: 95.3 10*3/uL (ref 19.0–186.0)
Retic Ct Pct: 2.2 % (ref 0.4–3.1)

## 2024-04-12 LAB — CBC
HCT: 38.2 % (ref 36.0–46.0)
Hemoglobin: 11.7 g/dL — ABNORMAL LOW (ref 12.0–15.0)
MCH: 27.6 pg (ref 26.0–34.0)
MCHC: 30.6 g/dL (ref 30.0–36.0)
MCV: 90.1 fL (ref 80.0–100.0)
Platelets: 231 10*3/uL (ref 150–400)
RBC: 4.24 MIL/uL (ref 3.87–5.11)
RDW: 14.6 % (ref 11.5–15.5)
WBC: 9.9 10*3/uL (ref 4.0–10.5)
nRBC: 0 % (ref 0.0–0.2)

## 2024-04-12 LAB — CK: Total CK: 91 U/L (ref 38–234)

## 2024-04-12 LAB — AMMONIA: Ammonia: <13 umol/L (ref 9–35)

## 2024-04-12 LAB — VITAMIN B12: Vitamin B-12: 743 pg/mL (ref 180–914)

## 2024-04-12 MED ORDER — PERFLUTREN LIPID MICROSPHERE
1.0000 mL | INTRAVENOUS | Status: AC | PRN
  Administered 2024-04-12: 2 mL via INTRAVENOUS

## 2024-04-12 NOTE — Plan of Care (Signed)

## 2024-04-12 NOTE — Progress Notes (Signed)
 Received pateint from ED patient was on 3L of O2 via nasal cannula with O2 saturations at 86%. Patient was bumped up to 4L saturations are now 91%. NO signs of Increase work of breathing patient resting comfortably in bed. Notified Darcel Dawley, MD.

## 2024-04-12 NOTE — ED Notes (Signed)
 Pt states it is okay to update her son. Patient's son Lynwood updated on patient status and plan of care per request.

## 2024-04-12 NOTE — Progress Notes (Signed)
 PROGRESS NOTE    Deanna Petty  FMW:[id number] DOB: 1946/03/29 DOA: 04/11/2024  PCP: [name], NP-C   Brief Narrative:  This 78 yrs old female with PMH significant of COPD on 3 L oxygen at baseline, hypertension, paroxysmal A-fib, nonobstructive CAD, depression, recent diagnosis of lung cancer receiving radiation, last therapy was on 04/04/2024 was brought to the ER after patient was becoming more weak,  fatigued and short of breath.  Patient's family also noticed that patient was getting confused.  Patient denies any chest pain or productive cough. In the ED VBG shows pH of 7.38 PCO2 of 56.2.  Creatinine 1.0, troponin 20 and 19.  EKG shows normal sinus rhythm.   WBC 10.9 with chest x-ray showing features concerning for possible pneumonia.  CT head was unremarkable.  Patient was empirically started on antibiotics for pneumonia.  IV fluids for dehydration.  Patient was admitted for further evaluation.  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   CT head > No acute intracranial abnormality.  Continue IV fluid resuscitation. Obtain MRI, 2D echo, CK 91, TSH and ammonia level > normal.. PT and OT evaluation.  Suspected pneumonia: Chest x-ray shows findings concerning for pneumonia. Continue empiric antibiotics (ceftriaxone  and Zithromax ).  Essential  hypertension: Continue amlodipine  and Imdur .   Hold losartan  due to mild increase in creatinine.  Acute renal failure with hyponatremia: Last creatinine in 2023 was normal.   Continue IV hydration, hold Cozaar  and Lasix .  Follow metabolic panel.  Paroxysmal atrial fibrillation: Heart rate well-controlled, continue Eliquis .  Patient is not taking any rate limiting medications.  History of nonobstructive CAD: Continue aspirin , statins and Imdur .  Normocytic Anemia: Follow CBC check anemia panel.  Lung cancer:  She is receiving radiation therapy. F/u Oncology as an outpatient.  COPD on 3 L oxygen; She is not actively wheezing.  Continue Breztri and Xopenex .  Depression : Continue Wellbutrin .  Hold Xanax.  Hyperlipidemia : Continue statins.  Diabetes mellitus type 2 : She takes metformin  and Farxiga .  On sliding scale coverage.   Check hemoglobin A1c.    DVT prophylaxis: Eliquis  Code Status: Full code Family Communication: No family at bedside Disposition Plan:    Status is: Inpatient Remains inpatient appropriate because: Admitted for generalized weakness, fatigue and found to have pneumonia and started on empiric antibiotics.   Consultants:  None  Procedures: None  Antimicrobials:  Anti-infectives (From admission, onward)    Start     Dose/Rate Route Frequency Ordered Stop   04/12/24 1800  cefTRIAXone  (ROCEPHIN ) 2 g in sodium chloride  0.9 % 100 mL IVPB        2 g 200 mL/hr over 30 Minutes Intravenous Every 24 hours 04/11/24 2335 04/17/24 1759   04/12/24 1800  azithromycin  (ZITHROMAX ) 500 mg in sodium chloride  0.9 % 250 mL IVPB        500 mg 250 mL/hr over 60 Minutes Intravenous  Every 24 hours 04/11/24 2335 04/17/24 1759   04/11/24 1715  cefTRIAXone  (ROCEPHIN ) 1 g in sodium chloride  0.9 % 100 mL IVPB  Status:  Discontinued        1 g 200 mL/hr over 30 Minutes Intravenous Every 24 hours 04/11/24 1706 04/12/24 0005   04/11/24 1715  azithromycin  (ZITHROMAX ) 500  mg in sodium chloride  0.9 % 250 mL IVPB        500 mg 250 mL/hr over 60 Minutes Intravenous  Once 04/11/24 1706 04/11/24 2101      Subjective: Patient was seen and examined at bedside.Overnight events noted. Patient remains on 3 L of oxygen which is her baseline.   States she is feeling slightly better but still feels weak and tired.  Objective: Vitals:   04/12/24 0912 04/12/24 1000 04/12/24 1038 04/12/24 1045  BP: (!) 127/54 (!) 102/56  (!) 138/57  Pulse: 88 85  85  Resp: (!) 22 (!) 22  19  Temp:   98.6 F (37 C)   TempSrc:   Oral   SpO2: 94% 93%  94%  Weight:      Height:        Intake/Output Summary (Last 24 hours) at 04/12/2024 1119 Last data filed at 04/12/2024 1040 Gross per 24 hour  Intake 691.51 ml  Output --  Net 691.51 ml   Filed Weights   04/11/24 1908  Weight: 81.6 kg    Examination:  General exam: Appears calm and comfortable, deconditioned, not in any acute distress. Respiratory system: CTA Bilaterally. Respiratory effort normal.  RR 16. Cardiovascular system: S1 & S2 heard, RRR. No JVD, murmurs, rubs, gallops or clicks.  Gastrointestinal system: Abdomen is non distended, soft and non tender. Normal bowel sounds heard. Central nervous system: Alert and oriented x 3. No focal neurological deficits. Extremities: No edema, no cyanosis, no clubbing Skin: No rashes, lesions or ulcers Psychiatry: Judgement and insight appear normal. Mood & affect appropriate.     Data Reviewed: I have personally reviewed following labs and imaging studies  CBC: Recent Labs  Lab 04/11/24 1540 04/11/24 1811 04/12/24 0537  WBC 10.9*  --  9.9  HGB 11.8* 12.6 11.7*  HCT 38.0 37.0 38.2  MCV 89.2  --  90.1  PLT 225  --  231   Basic Metabolic Panel: Recent Labs  Lab 04/11/24 1540 04/11/24 1811 04/12/24 0537  NA 131* 138 137  K 4.2 4.2 4.6  CL 93*  --  97*  CO2 25  --  26  GLUCOSE 142*  --  160*  BUN 15  --  20  CREATININE 1.09*  --  1.07*  CALCIUM  8.8*  --   9.2   GFR: Estimated Creatinine Clearance: 44.3 mL/min (A) (by C-G formula based on SCr of 1.07 mg/dL (H)). Liver Function Tests: Recent Labs  Lab 04/12/24 0537  AST 17  ALT 14  ALKPHOS 73  BILITOT 0.5  PROT 7.2  ALBUMIN 2.7*   No results for input(s): LIPASE, AMYLASE in the last 168 hours. Recent Labs  Lab 04/12/24 0755  AMMONIA <13   Coagulation Profile: No results for input(s): INR, PROTIME in the last 168 hours. Cardiac Enzymes: Recent Labs  Lab 04/12/24 0755  CKTOTAL 91   BNP (last 3 results) No results for input(s): PROBNP in the last 8760 hours. HbA1C: No results for input(s): HGBA1C in the last 72 hours. CBG: No results for input(s): GLUCAP in the last 168 hours. Lipid Profile: No results for input(s): CHOL, HDL, LDLCALC,  TRIG, CHOLHDL, LDLDIRECT in the last 72 hours. Thyroid Function Tests: No results for input(s): TSH, T4TOTAL, FREET4, T3FREE, THYROIDAB in the last 72 hours. Anemia Panel: Recent Labs    04/12/24 0755  VITAMINB12 743  FOLATE 15.0  FERRITIN 53  TIBC 347  IRON 29  RETICCTPCT 2.2   Sepsis Labs: No results for input(s): PROCALCITON, LATICACIDVEN in the last 168 hours.  No results found for this or any previous visit (from the past 240 hours).  progre Radiology Studies: DG Abd 1 View Result Date: 04/12/2024 CLINICAL DATA:  Evaluate abdominal distension, non gaseous. EXAM: ABDOMEN - 1 VIEW COMPARISON:  None Available. FINDINGS: Within the left lower quadrant of the abdomen there are multiple loops of air-filled small bowel which are mildly increased in caliber measuring up to 2.5 cm. Gas and stool noted within the colon up to the level of the rectum. Lumbar degenerative disc disease with mild to moderate scoliosis, convex towards the left. Postoperative changes involving the proximal right femur. IMPRESSION: Mildly increased caliber of air-filled small bowel within the left lower quadrant of the  abdomen. Findings are nonspecific and may represent ileus or partial small bowel obstruction. Electronically Signed   By: Waddell Calk M.D.   On: 04/12/2024 10:47   CT Head Wo Contrast Result Date: 04/11/2024 CLINICAL DATA:  Chief complaints Weakness CT Head Wo Contrast Head trauma, minor (Age >= 65y) EXAM: CT HEAD WITHOUT CONTRAST TECHNIQUE: Contiguous axial images were obtained from the base of the skull through the vertex without intravenous contrast. RADIATION DOSE REDUCTION: This exam was performed according to the departmental dose-optimization program which includes automated exposure control, adjustment of the mA and/or kV according to patient size and/or use of iterative reconstruction technique. COMPARISON:  CT head 12/30/2020 FINDINGS: Brain: Patchy and confluent areas of decreased attenuation are noted throughout the deep and periventricular white matter of the cerebral hemispheres bilaterally, compatible with chronic microvascular ischemic disease. No evidence of large-territorial acute infarction. No parenchymal hemorrhage. No mass lesion. No extra-axial collection. No mass effect or midline shift. No hydrocephalus. Basilar cisterns are patent. Vascular: No hyperdense vessel. Atherosclerotic calcifications are present within the cavernous internal carotid arteries. Skull: No acute fracture or focal lesion. Sinuses/Orbits: Left maxillary, sphenoid, bilateral ethmoid sinus mucosal thickening. Paranasal sinuses and mastoid air cells are clear. Bilateral lens replacement. Otherwise the orbits are unremarkable. Other: None. IMPRESSION: No acute intracranial abnormality. Electronically Signed   By: Morgane  Naveau M.D.   On: 04/11/2024 19:03   DG Chest 2 View Result Date: 04/11/2024 CLINICAL DATA:  Shortness of breath EXAM: CHEST - 2 VIEW COMPARISON:  Chest x-ray 12/14/2023. CT abdomen and pelvis 01/11/2024. FINDINGS: There are patchy airspace opacities in the right upper lobe. Faint focal mass in  the superior segment of the left lower lobe is present, grossly unchanged. There is no pleural effusion or pneumothorax. The cardiomediastinal silhouette is stable. No acute fractures are seen. IMPRESSION: 1. Patchy airspace opacities in the right upper lobe, suspicious for pneumonia. Follow-up imaging recommended in 4-6 weeks to confirm resolution. 2. Unchanged left lower lobe nodule. Electronically Signed   By: Greig Pique M.D.   On: 04/11/2024 16:31   Scheduled Meds:  amLODipine   10 mg Oral Daily   apixaban   5 mg Oral BID   aspirin  EC  81 mg Oral Daily   budesonide -glycopyrrolate-formoterol   2 puff Inhalation BID   buPROPion   150 mg Oral Daily   isosorbide  mononitrate  30 mg Oral Daily   rosuvastatin   5  mg Oral Daily   Continuous Infusions:  azithromycin      cefTRIAXone  (ROCEPHIN )  IV     lactated ringers  75 mL/hr at 04/12/24 1040     LOS: 0 days    Time spent: 50 mins    Darcel Dawley, MD [facility]   If 7PM-7AM, please contact night-coverage

## 2024-04-12 NOTE — ED Notes (Signed)
 Pt assisted with bedpan. Approximately of urine. Pt cleaned, fresh brief on, and fresh linen. Call bell within reach. Pt denies further needs at this time.

## 2024-04-12 NOTE — ED Notes (Signed)
 Dr. Darcel at bedside. Per MD pt is okay to travel off cardiac monitoring for MRI. RT aware.

## 2024-04-12 NOTE — ED Notes (Signed)
 PT REMOVED PIV, BATH WAS GIVEN, NEW PIV STARTED, PT PROVIDED DIET COLA

## 2024-04-12 NOTE — ED Notes (Signed)
Pt to ECHO with transport

## 2024-04-12 NOTE — ED Notes (Signed)
 Patient transported to X-ray

## 2024-04-12 NOTE — Progress Notes (Signed)
*  PRELIMINARY RESULTS* Echocardiogram 2D Echocardiogram has been performed.  Bari BROCKS Sha Amer 04/12/2024, 12:38 PM

## 2024-04-12 NOTE — ED Notes (Signed)
 Floor charge notified

## 2024-04-12 NOTE — ED Notes (Signed)
 Also called ECHO to transport patient up the admit room

## 2024-04-13 DIAGNOSIS — R5383 Other fatigue: Secondary | ICD-10-CM | POA: Diagnosis not present

## 2024-04-13 MED ORDER — KETOROLAC TROMETHAMINE 15 MG/ML IJ SOLN
15.0000 mg | Freq: Once | INTRAMUSCULAR | Status: AC
  Administered 2024-04-13: 15 mg via INTRAVENOUS
  Filled 2024-04-13: qty 1

## 2024-04-13 NOTE — Progress Notes (Signed)
 Pt SAT at 94 on 4L.

## 2024-04-13 NOTE — Progress Notes (Signed)
 PT Sat 94% at 2L

## 2024-04-13 NOTE — Plan of Care (Signed)
  Problem: Education: Goal: Knowledge of General Education information will improve Description: Including pain rating scale, medication(s)/side effects and non-pharmacologic comfort measures Outcome: Progressing   Problem: Health Behavior/Discharge Planning: Goal: Ability to manage health-related needs will improve Outcome: Progressing   Problem: Clinical Measurements: Goal: Will remain free from infection Outcome: Progressing Goal: Diagnostic test results will improve Outcome: Progressing Goal: Respiratory complications will improve Outcome: Progressing   Problem: Activity: Goal: Risk for activity intolerance will decrease Outcome: Progressing   Problem: Nutrition: Goal: Adequate nutrition will be maintained Outcome: Progressing

## 2024-04-13 NOTE — Plan of Care (Signed)
  Problem: Health Behavior/Discharge Planning: Goal: Ability to manage health-related needs will improve Outcome: Progressing   Problem: Clinical Measurements: Goal: Ability to maintain clinical measurements within normal limits will improve Outcome: Progressing Goal: Will remain free from infection Outcome: Progressing Goal: Diagnostic test results will improve Outcome: Progressing Goal: Respiratory complications will improve Outcome: Progressing Goal: Cardiovascular complication will be avoided Outcome: Progressing   Problem: Coping: Goal: Level of anxiety will decrease Outcome: Progressing   Problem: Elimination: Goal: Will not experience complications related to bowel motility Outcome: Progressing   Problem: Pain Managment: Goal: General experience of comfort will improve and/or be controlled Outcome: Progressing

## 2024-04-13 NOTE — Progress Notes (Signed)
 Pt had a coughing spell with a non stop productive coughing. Checked Pt SAT  and it was 87, upped the O2 to 4L. She wasn't making much progress, I called respiratory and she came to evaluate. The resp therapist upped the O2 to 5L, she got back up to a 92 SAT level and we left the pt on 5L. Resp said that if she dropped below 90 again to place her on the aerosol mask that she had programmed before she left out. Pt began dropping in the 80's again and I placed the mask on at that time. Made resp aware.

## 2024-04-13 NOTE — Progress Notes (Signed)
 PROGRESS NOTE    LURETHA EBERLY  FMW:[id number] DOB: November 29, 1945 DOA: 04/11/2024  PCP: [name], NP-C   Brief Narrative:  This 78 yrs old female with PMH significant of COPD on 3 L oxygen at baseline, hypertension, paroxysmal A-fib, nonobstructive CAD, depression, recent diagnosis of lung cancer receiving radiation, last therapy was on 04/04/2024 was brought to the ER after patient was becoming more weak,  fatigued and short of breath.  Patient's family also noticed that patient was getting confused.  Patient denies any chest pain or productive cough. In the ED VBG shows pH of 7.38 PCO2 of 56.2.  Creatinine 1.0, troponin 20 and 19.  EKG shows normal sinus rhythm.   WBC 10.9 with chest x-ray showing features concerning for possible pneumonia.  CT head was unremarkable.  Patient was empirically started on antibiotics for pneumonia.  IV fluids for dehydration.  Patient was admitted for further evaluation.  Assessment & Plan:   Principal Problem:   Fatigue Active Problems:   Type 2 diabetes mellitus with hyperlipidemia (HCC)   Polycythemia, secondary   COPD (chronic obstructive pulmonary disease) (HCC)   Essential hypertension   Paroxysmal atrial fibrillation [facility]), diagnosed March 2024, CHADS2 Vascor 6, on anticoagulation with Eliquis    CAD (coronary artery disease), last April 2024, distal LCx 70% stenosis small vessel for intervention, on medical therapy   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)   ARF (acute renal failure) (HCC)   Hyponatremia  Fatigue and weakness: It may be related to radiation treatment,  She also appears dehydrated.   CT head > No acute intracranial abnormality.  Continue IV fluid resuscitation. CK 91, TSH and ammonia level > normal. MRI > age-related atrophy, no acute abnormality found. Echocardiogram shows LVEF 75% hyperdynamic circulation no RWMA PT and OT evaluation.  Suspected pneumonia: Chest x-ray shows findings concerning for  pneumonia. Continue empiric antibiotics (ceftriaxone  and Zithromax ).  Essential hypertension: Continue amlodipine  and Imdur .   Hold losartan  due to mild increase in creatinine.  Acute renal failure with hyponatremia: Last creatinine in 2023 was normal.   Continue IV hydration, hold Cozaar  and Lasix .  Follow metabolic panel.  Paroxysmal atrial fibrillation: Heart rate well-controlled, continue Eliquis .  Patient is not taking any rate limiting medications.  History of nonobstructive CAD: Continue aspirin , statins and Imdur .  Normocytic Anemia: Follow CBC check anemia panel.  Lung cancer:  She is receiving radiation therapy. F/u Oncology as an outpatient.  COPD on 3 L oxygen; She is not actively wheezing.  Continue Breztri and Xopenex .  Depression : Continue Wellbutrin .  Hold Xanax.  Hyperlipidemia : Continue statins.  Diabetes mellitus type 2 : She takes metformin  and Farxiga .  On sliding scale coverage.   Check hemoglobin A1c.    DVT prophylaxis: Eliquis  Code Status: Full code Family Communication: No family at bedside Disposition Plan:    Status is: Inpatient Remains inpatient appropriate because: Admitted for generalized weakness, fatigue and found to have pneumonia and started on empiric antibiotics.   Consultants:  None  Procedures: None  Antimicrobials:  Anti-infectives (From admission, onward)    Start     Dose/Rate Route Frequency Ordered Stop   04/12/24 1800  cefTRIAXone  (ROCEPHIN ) 2 g in sodium chloride  0.9 % 100 mL IVPB        2 g 200 mL/hr over 30 Minutes Intravenous Every 24 hours 04/11/24 2335 04/17/24 1759   04/12/24 1800  azithromycin  (ZITHROMAX ) 500 mg in sodium chloride  0.9 % 250 mL IVPB  500 mg 250 mL/hr over 60 Minutes Intravenous Every 24 hours 04/11/24 2335 04/17/24 1759   04/11/24 1715  cefTRIAXone  (ROCEPHIN ) 1 g in sodium chloride  0.9 % 100 mL IVPB  Status:  Discontinued        1 g 200 mL/hr over 30 Minutes Intravenous Every  24 hours 04/11/24 1706 04/12/24 0005   04/11/24 1715  azithromycin  (ZITHROMAX ) 500 mg in sodium chloride  0.9 % 250 mL IVPB        500 mg 250 mL/hr over 60 Minutes Intravenous  Once 04/11/24 1706 04/11/24 2101      Subjective: Patient was seen and examined at bedside.Overnight events noted. Patient remains on 3 L of oxygen which is her baseline.   States she still feels very weak and tired but she is getting better.  Objective: Vitals:   04/13/24 0500 04/13/24 0609 04/13/24 0826 04/13/24 1200  BP:   (!) 124/45   Pulse:   76   Resp:   16   Temp:   97.8 F (36.6 C)   TempSrc:   Oral   SpO2: 97% 94% 93% 92%  Weight:      Height:        Intake/Output Summary (Last 24 hours) at 04/13/2024 1239 Last data filed at 04/13/2024 0830 Gross per 24 hour  Intake 12.41 ml  Output 1600 ml  Net -1587.59 ml   Filed Weights   04/11/24 1908  Weight: 81.6 kg    Examination:  General exam: Appears calm and comfortable, deconditioned, not in any acute distress. Respiratory system: CTA Bilaterally. Respiratory effort normal.  RR 15. Cardiovascular system: S1 & S2 heard, RRR. No JVD, murmurs, rubs, gallops or clicks.  Gastrointestinal system: Abdomen is non distended, soft and non tender. Normal bowel sounds heard. Central nervous system: Alert and oriented x 3. No focal neurological deficits. Extremities: No edema, no cyanosis, no clubbing Skin: No rashes, lesions or ulcers Psychiatry: Judgement and insight appear normal. Mood & affect appropriate.     Data Reviewed: I have personally reviewed following labs and imaging studies  CBC: Recent Labs  Lab 04/11/24 1540 04/11/24 1811 04/12/24 0537  WBC 10.9*  --  9.9  HGB 11.8* 12.6 11.7*  HCT 38.0 37.0 38.2  MCV 89.2  --  90.1  PLT 225  --  231   Basic Metabolic Panel: Recent Labs  Lab 04/11/24 1540 04/11/24 1811 04/12/24 0537  NA 131* 138 137  K 4.2 4.2 4.6  CL 93*  --  97*  CO2 25  --  26  GLUCOSE 142*  --  160*  BUN 15   --  20  CREATININE 1.09*  --  1.07*  CALCIUM  8.8*  --  9.2   GFR: Estimated Creatinine Clearance: 44.3 mL/min (A) (by C-G formula based on SCr of 1.07 mg/dL (H)). Liver Function Tests: Recent Labs  Lab 04/12/24 0537  AST 17  ALT 14  ALKPHOS 73  BILITOT 0.5  PROT 7.2  ALBUMIN 2.7*   No results for input(s): LIPASE, AMYLASE in the last 168 hours. Recent Labs  Lab 04/12/24 0755  AMMONIA <13   Coagulation Profile: No results for input(s): INR, PROTIME in the last 168 hours. Cardiac Enzymes: Recent Labs  Lab 04/12/24 0755  CKTOTAL 91   BNP (last 3 results) No results for input(s): PROBNP in the last 8760 hours. HbA1C: No results for input(s): HGBA1C in the last 72 hours. CBG: No results for input(s): GLUCAP in the last 168 hours. Lipid Profile: No  results for input(s): CHOL, HDL, LDLCALC, TRIG, CHOLHDL, LDLDIRECT in the last 72 hours. Thyroid Function Tests: No results for input(s): TSH, T4TOTAL, FREET4, T3FREE, THYROIDAB in the last 72 hours. Anemia Panel: Recent Labs    04/12/24 0755  VITAMINB12 743  FOLATE 15.0  FERRITIN 53  TIBC 347  IRON 29  RETICCTPCT 2.2   Sepsis Labs: No results for input(s): PROCALCITON, LATICACIDVEN in the last 168 hours.  No results found for this or any previous visit (from the past 240 hours).  progre Radiology Studies: MR BRAIN WO CONTRAST Result Date: 04/12/2024 CLINICAL DATA:  Mental status change, unknown cause EXAM: MRI HEAD WITHOUT CONTRAST TECHNIQUE: Multiplanar, multiecho pulse sequences of the brain and surrounding structures were obtained without intravenous contrast. COMPARISON:  CT of the head dated April 11, 2024. FINDINGS: Brain: There is moderate generalized is cerebral volume loss present. There are confluent areas of increased T2 signal present within the periventricular, deep cerebral and subcortical white matter. There is no evidence of hemorrhage, mass, acute cortical infarct  or hydrocephalus. Vascular: Normal flow voids. Skull and upper cervical spine: Normal marrow signal. Sinuses/Orbits: Moderate mucosal disease within the ethmoid, sphenoid and left maxillary sinuses. Status post bilateral lens replacement. Other: None. IMPRESSION: 1. Age-related atrophy and advanced cerebral white matter disease. 2. Moderate paranasal sinus disease. Electronically Signed   By: Evalene Coho M.D.   On: 04/12/2024 14:09   ECHOCARDIOGRAM COMPLETE Result Date: 04/12/2024    ECHOCARDIOGRAM REPORT   Patient Name:   NYELLI SAMARA Date of Exam: 04/12/2024 Medical Rec #:  [id number]     Height:       62.0 in Accession #:    [id number]    Weight:       180.0 lb Date of Birth:  [birth year]    BSA:          1.828 m Patient Age:    77 years      BP:           138/57 mmHg Patient Gender: F             HR:           93 bpm. Exam Location:  Inpatient Procedure: 2D Echo, Cardiac Doppler, Color Doppler and Intracardiac            Opacification Agent (Both Spectral and Color Flow Doppler were            utilized during procedure). Indications:    SOB  History:        Patient has prior history of Echocardiogram examinations. CHF;                 Risk Factors:Hypertension.  Sonographer:    Bari Roar Referring Phys: [id number] ARSHAD N KAKRAKANDY IMPRESSIONS  1. Left ventricular ejection fraction, by estimation, is >75%. The left ventricle has hyperdynamic function. The left ventricle has no regional wall motion abnormalities. There is mild concentric left ventricular hypertrophy. Left ventricular diastolic parameters are consistent with Grade I diastolic dysfunction (impaired relaxation).  2. Right ventricular systolic function is normal. The right ventricular size is normal.  3. Left atrial size was mildly dilated.  4. The mitral valve is normal in structure. Trivial mitral valve regurgitation. No evidence of mitral stenosis.  5. The aortic valve was not well visualized. There is moderate calcification of the aortic  valve. Aortic valve regurgitation is not visualized. Moderate aortic valve stenosis. Aortic valve area, by VTI measures 1.21 cm. Aortic valve mean gradient  measures 21.5 mmHg. Aortic valve Vmax measures 2.93 m/s.  6. The inferior vena cava is normal in size with greater than 50% respiratory variability, suggesting right atrial pressure of 3 mmHg. FINDINGS  Left Ventricle: Left ventricular ejection fraction, by estimation, is >75%. The left ventricle has hyperdynamic function. The left ventricle has no regional wall motion abnormalities. Definity  contrast agent was given IV to delineate the left ventricular endocardial borders. The left ventricular internal cavity size was normal in size. There is mild concentric left ventricular hypertrophy. Left ventricular diastolic parameters are consistent with Grade I diastolic dysfunction (impaired relaxation). Right Ventricle: The right ventricular size is normal. No increase in right ventricular wall thickness. Right ventricular systolic function is normal. Left Atrium: Left atrial size was mildly dilated. Right Atrium: Right atrial size was normal in size. Pericardium: There is no evidence of pericardial effusion. Mitral Valve: The mitral valve is normal in structure. Mild mitral annular calcification. Trivial mitral valve regurgitation. No evidence of mitral valve stenosis. MV peak gradient, 12.2 mmHg. The mean mitral valve gradient is 7.0 mmHg. Tricuspid Valve: The tricuspid valve is normal in structure. Tricuspid valve regurgitation is trivial. No evidence of tricuspid stenosis. Aortic Valve: The aortic valve was not well visualized. There is moderate calcification of the aortic valve. Aortic valve regurgitation is not visualized. Moderate aortic stenosis is present. Aortic valve mean gradient measures 21.5 mmHg. Aortic valve peak gradient measures 34.3 mmHg. Aortic valve area, by VTI measures 1.21 cm. Pulmonic Valve: The pulmonic valve was normal in structure.  Pulmonic valve regurgitation is mild. No evidence of pulmonic stenosis. Aorta: The aortic root is normal in size and structure. Venous: The inferior vena cava is normal in size with greater than 50% respiratory variability, suggesting right atrial pressure of 3 mmHg. IAS/Shunts: No atrial level shunt detected by color flow Doppler.  LEFT VENTRICLE PLAX 2D LVIDd:         4.40 cm   Diastology LVIDs:         3.00 cm   LV e' medial:    4.35 cm/s LV PW:         1.10 cm   LV E/e' medial:  29.9 LV IVS:        1.30 cm   LV e' lateral:   7.18 cm/s LVOT diam:     1.80 cm   LV E/e' lateral: 18.1 LV SV:         64 LV SV Index:   35 LVOT Area:     2.54 cm  RIGHT VENTRICLE RV Basal diam:  2.80 cm RV Mid diam:    2.50 cm RV S prime:     12.80 cm/s TAPSE (M-mode): 2.0 cm LEFT ATRIUM             Index        RIGHT ATRIUM           Index LA diam:        3.40 cm 1.86 cm/m   RA Area:     13.90 cm LA Vol (A2C):   43.1 ml 23.58 ml/m  RA Volume:   33.30 ml  18.22 ml/m LA Vol (A4C):   38.1 ml 20.84 ml/m LA Biplane Vol: 40.6 ml 22.21 ml/m  AORTIC VALVE                     PULMONIC VALVE AV Area (Vmax):    1.21 cm      PV Vmax:  1.36 m/s AV Area (Vmean):   1.10 cm      PV Peak grad:   7.4 mmHg AV Area (VTI):     1.21 cm      RVOT Peak grad: 6 mmHg AV Vmax:           293.00 cm/s AV Vmean:          219.000 cm/s AV VTI:            0.528 m AV Peak Grad:      34.3 mmHg AV Mean Grad:      21.5 mmHg LVOT Vmax:         139.00 cm/s LVOT Vmean:        94.500 cm/s LVOT VTI:          0.251 m LVOT/AV VTI ratio: 0.47  AORTA Ao Root diam: 2.90 cm Ao Asc diam:  3.10 cm MITRAL VALVE MV Area (PHT): 5.42 cm     SHUNTS MV Area VTI:   1.91 cm     Systemic VTI:  0.25 m MV Peak grad:  12.2 mmHg    Systemic Diam: 1.80 cm MV Mean grad:  7.0 mmHg MV Vmax:       1.75 m/s MV Vmean:      122.0 cm/s MV Decel Time: 140 msec MV E velocity: 130.00 cm/s MV A velocity: 171.00 cm/s MV E/A ratio:  0.76 Toribio Fuel MD Electronically signed by Toribio Fuel MD Signature Date/Time: 04/12/2024/12:47:50 PM    Final    DG Abd 1 View Result Date: 04/12/2024 CLINICAL DATA:  Evaluate abdominal distension, non gaseous. EXAM: ABDOMEN - 1 VIEW COMPARISON:  None Available. FINDINGS: Within the left lower quadrant of the abdomen there are multiple loops of air-filled small bowel which are mildly increased in caliber measuring up to 2.5 cm. Gas and stool noted within the colon up to the level of the rectum. Lumbar degenerative disc disease with mild to moderate scoliosis, convex towards the left. Postoperative changes involving the proximal right femur. IMPRESSION: Mildly increased caliber of air-filled small bowel within the left lower quadrant of the abdomen. Findings are nonspecific and may represent ileus or partial small bowel obstruction. Electronically Signed   By: Waddell Calk M.D.   On: 04/12/2024 10:47   CT Head Wo Contrast Result Date: 04/11/2024 CLINICAL DATA:  Chief complaints Weakness CT Head Wo Contrast Head trauma, minor (Age >= 65y) EXAM: CT HEAD WITHOUT CONTRAST TECHNIQUE: Contiguous axial images were obtained from the base of the skull through the vertex without intravenous contrast. RADIATION DOSE REDUCTION: This exam was performed according to the departmental dose-optimization program which includes automated exposure control, adjustment of the mA and/or kV according to patient size and/or use of iterative reconstruction technique. COMPARISON:  CT head 12/30/2020 FINDINGS: Brain: Patchy and confluent areas of decreased attenuation are noted throughout the deep and periventricular white matter of the cerebral hemispheres bilaterally, compatible with chronic microvascular ischemic disease. No evidence of large-territorial acute infarction. No parenchymal hemorrhage. No mass lesion. No extra-axial collection. No mass effect or midline shift. No hydrocephalus. Basilar cisterns are patent. Vascular: No hyperdense vessel. Atherosclerotic  calcifications are present within the cavernous internal carotid arteries. Skull: No acute fracture or focal lesion. Sinuses/Orbits: Left maxillary, sphenoid, bilateral ethmoid sinus mucosal thickening. Paranasal sinuses and mastoid air cells are clear. Bilateral lens replacement. Otherwise the orbits are unremarkable. Other: None. IMPRESSION: No acute intracranial abnormality. Electronically Signed   By: Morgane  Naveau M.D.   On: 04/11/2024  19:03   DG Chest 2 View Result Date: 04/11/2024 CLINICAL DATA:  Shortness of breath EXAM: CHEST - 2 VIEW COMPARISON:  Chest x-ray 12/14/2023. CT abdomen and pelvis 01/11/2024. FINDINGS: There are patchy airspace opacities in the right upper lobe. Faint focal mass in the superior segment of the left lower lobe is present, grossly unchanged. There is no pleural effusion or pneumothorax. The cardiomediastinal silhouette is stable. No acute fractures are seen. IMPRESSION: 1. Patchy airspace opacities in the right upper lobe, suspicious for pneumonia. Follow-up imaging recommended in 4-6 weeks to confirm resolution. 2. Unchanged left lower lobe nodule. Electronically Signed   By: Greig Pique M.D.   On: 04/11/2024 16:31   Scheduled Meds:  amLODipine   10 mg Oral Daily   apixaban   5 mg Oral BID   aspirin  EC  81 mg Oral Daily   budesonide -glycopyrrolate-formoterol   2 puff Inhalation BID   buPROPion   150 mg Oral Daily   isosorbide  mononitrate  30 mg Oral Daily   rosuvastatin   5 mg Oral Daily   Continuous Infusions:  azithromycin  250 mL/hr at 04/12/24 1816   cefTRIAXone  (ROCEPHIN )  IV 2 g (04/12/24 1733)     LOS: 1 day    Time spent: 35 mins    Darcel Dawley, MD [facility]   If 7PM-7AM, please contact night-coverage

## 2024-04-14 DIAGNOSIS — R5383 Other fatigue: Secondary | ICD-10-CM | POA: Diagnosis not present

## 2024-04-14 LAB — CBC
HCT: 36.1 % (ref 36.0–46.0)
Hemoglobin: 11.1 g/dL — ABNORMAL LOW (ref 12.0–15.0)
MCH: 28 pg (ref 26.0–34.0)
MCHC: 30.7 g/dL (ref 30.0–36.0)
MCV: 91.2 fL (ref 80.0–100.0)
Platelets: 242 10*3/uL (ref 150–400)
RBC: 3.96 MIL/uL (ref 3.87–5.11)
RDW: 14.7 % (ref 11.5–15.5)
WBC: 11 10*3/uL — ABNORMAL HIGH (ref 4.0–10.5)
nRBC: 0 % (ref 0.0–0.2)

## 2024-04-14 LAB — PHOSPHORUS: Phosphorus: 3.5 mg/dL (ref 2.5–4.6)

## 2024-04-14 LAB — BASIC METABOLIC PANEL WITH GFR
Anion gap: 10 (ref 5–15)
BUN: 11 mg/dL (ref 8–23)
CO2: 30 mmol/L (ref 22–32)
Calcium: 8.8 mg/dL — ABNORMAL LOW (ref 8.9–10.3)
Chloride: 98 mmol/L (ref 98–111)
Creatinine, Ser: 0.83 mg/dL (ref 0.44–1.00)
GFR, Estimated: >60 mL/min (ref >60)
Glucose, Bld: 109 mg/dL — ABNORMAL HIGH (ref 70–99)
Potassium: 4.1 mmol/L (ref 3.5–5.1)
Sodium: 138 mmol/L (ref 135–145)

## 2024-04-14 LAB — MAGNESIUM: Magnesium: 2 mg/dL (ref 1.7–2.4)

## 2024-04-14 MED ORDER — BISOPROLOL FUMARATE 5 MG PO TABS
5.0000 mg | ORAL_TABLET | Freq: Every day | ORAL | Status: DC
  Administered 2024-04-14 – 2024-04-16 (×3): 5 mg via ORAL
  Filled 2024-04-14 (×3): qty 1

## 2024-04-14 MED ORDER — GUAIFENESIN 100 MG/5ML PO LIQD
5.0000 mL | ORAL | Status: DC | PRN
  Administered 2024-04-14: 5 mL via ORAL
  Filled 2024-04-14 (×2): qty 5

## 2024-04-14 NOTE — Progress Notes (Signed)
 Call by RN, stated pt had requested CPAP for the night. Asked RN to reach out to MD and get order for CPAP.  At bedside with CPAP machine, pt stated she has been having coughing spells and it has been hard to wear her CPAP at home. Pt stated she doesn't want to wear hospital CPAP for the night. RN made aware

## 2024-04-14 NOTE — Progress Notes (Signed)
 PROGRESS NOTE    Deanna Petty  FMW:[id number] DOB: Aug 02, 1946 DOA: 04/11/2024  PCP: [name], NP-C   Brief Narrative:  This 78 yrs old female with PMH significant of COPD on 3 L oxygen at baseline, hypertension, paroxysmal A-fib, nonobstructive CAD, depression, recent diagnosis of lung cancer receiving radiation, last therapy was on 04/04/2024 was brought to the ER after patient was becoming more weak,  fatigued and short of breath.  Patient's family also noticed that patient was getting confused.  Patient denies any chest pain or productive cough. In the ED VBG shows pH of 7.38 PCO2 of 56.2.  Creatinine 1.0, troponin 20 and 19.  EKG shows normal sinus rhythm.   WBC 10.9 with chest x-ray showing features concerning for possible pneumonia.  CT head was unremarkable.  Patient was empirically started on antibiotics for pneumonia.  IV fluids for dehydration.  Patient was admitted for further evaluation.  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   CT head > No acute intracranial abnormality.  Continue IV fluid resuscitation. CK 91, TSH and ammonia level > normal. MRI > age-related atrophy, no acute abnormality found. Echocardiogram shows LVEF 75% hyperdynamic circulation no RWMA PT and OT evaluation- pending  Suspected pneumonia: Chest x-ray shows findings concerning for  pneumonia. Continue empiric antibiotics (ceftriaxone  and Zithromax ).  Essential hypertension: Continue amlodipine  and Imdur .   Hold losartan  due to mild increase in creatinine.  Acute renal failure with hyponatremia: Last creatinine in 2023 was normal.   Continue IV hydration, hold Cozaar  and Lasix .  Follow metabolic panel.  Paroxysmal atrial fibrillation: Heart rate well-controlled, continue Eliquis .  Resume Bisoprolol .  History of nonobstructive CAD: Continue aspirin , statins and Imdur .  Normocytic Anemia: Follow CBC,  check anemia panel.  Lung cancer:  She is receiving radiation therapy. F/u Oncology as an outpatient.  COPD on 3 L oxygen; She is not actively wheezing.  Continue Breztri and Xopenex .  Depression : Continue Wellbutrin .  Hold Xanax.  Hyperlipidemia : Continue statins.  Diabetes mellitus type 2 : She takes metformin  and Farxiga .  On sliding scale coverage.   Check hemoglobin A1c.    DVT prophylaxis: Eliquis  Code Status: Full code Family Communication: No family at bedside Disposition Plan:    Status is: Inpatient Remains inpatient appropriate because: Admitted for generalized weakness, fatigue and found to have pneumonia and started on empiric antibiotics. PT/OT pending    Consultants:  None  Procedures: None  Antimicrobials:  Anti-infectives (From admission, onward)    Start     Dose/Rate Route Frequency Ordered Stop   04/12/24 1800  cefTRIAXone  (ROCEPHIN ) 2 g in sodium chloride  0.9 % 100 mL IVPB        2 g 200 mL/hr over 30 Minutes Intravenous Every 24 hours 04/11/24 2335 04/17/24 1759   04/12/24 1800  azithromycin  (ZITHROMAX ) 500 mg in sodium chloride  0.9 % 250 mL IVPB  500 mg 250 mL/hr over 60 Minutes Intravenous Every 24 hours 04/11/24 2335 04/17/24 1759   04/11/24 1715  cefTRIAXone  (ROCEPHIN ) 1 g in sodium chloride  0.9 % 100 mL IVPB  Status:  Discontinued        1 g 200 mL/hr over 30 Minutes Intravenous Every 24 hours  04/11/24 1706 04/12/24 0005   04/11/24 1715  azithromycin  (ZITHROMAX ) 500 mg in sodium chloride  0.9 % 250 mL IVPB        500 mg 250 mL/hr over 60 Minutes Intravenous  Once 04/11/24 1706 04/11/24 2101      Subjective: Patient was seen and examined at bedside.Overnight events noted. Patient remains on 3 L of oxygen which is her baseline.   Patient reports she still feels weak and tired but getting improved.  Objective: Vitals:   04/14/24 0401 04/14/24 0834 04/14/24 0937 04/14/24 1239  BP: (!) 127/46  (!) 139/47 (!) 107/44  Pulse: 80  87 (!) 156  Resp: 19  18   Temp: 97.7 F (36.5 C)  97.7 F (36.5 C)   TempSrc: Oral  Oral   SpO2: 93% 91% 92% 90%  Weight:      Height:        Intake/Output Summary (Last 24 hours) at 04/14/2024 1324 Last data filed at 04/14/2024 1030 Gross per 24 hour  Intake 180 ml  Output 1550 ml  Net -1370 ml   Filed Weights   04/11/24 1908  Weight: 81.6 kg    Examination:  General exam: Appears calm and comfortable, deconditioned, not in any acute distress. Respiratory system: CTA Bilaterally. Respiratory effort normal.  RR 14. Cardiovascular system: S1 & S2 heard, RRR. No JVD, murmurs, rubs, gallops or clicks.  Gastrointestinal system: Abdomen is non distended, soft and non tender. Normal bowel sounds heard. Central nervous system: Alert and oriented x 3. No focal neurological deficits. Extremities: No edema, no cyanosis, no clubbing Skin: No rashes, lesions or ulcers Psychiatry: Judgement and insight appear normal. Mood & affect appropriate.     Data Reviewed: I have personally reviewed following labs and imaging studies  CBC: Recent Labs  Lab 04/11/24 1540 04/11/24 1811 04/12/24 0537 04/14/24 0925  WBC 10.9*  --  9.9 11.0*  HGB 11.8* 12.6 11.7* 11.1*  HCT 38.0 37.0 38.2 36.1  MCV 89.2  --  90.1 91.2  PLT 225  --  231 242   Basic Metabolic Panel: Recent Labs  Lab 04/11/24 1540 04/11/24 1811 04/12/24 0537 04/14/24 0925  NA 131*  138 137 138  K 4.2 4.2 4.6 4.1  CL 93*  --  97* 98  CO2 25  --  26 30  GLUCOSE 142*  --  160* 109*  BUN 15  --  20 11  CREATININE 1.09*  --  1.07* 0.83  CALCIUM  8.8*  --  9.2 8.8*  MG  --   --   --  2.0  PHOS  --   --   --  3.5   GFR: Estimated Creatinine Clearance: 57.1 mL/min (by C-G formula based on SCr of 0.83 mg/dL). Liver Function Tests: Recent Labs  Lab 04/12/24 0537  AST 17  ALT 14  ALKPHOS 73  BILITOT 0.5  PROT 7.2  ALBUMIN 2.7*   No results for input(s): LIPASE, AMYLASE in the last 168 hours. Recent Labs  Lab 04/12/24 0755  AMMONIA <13   Coagulation Profile: No results for input(s): INR, PROTIME in the last 168 hours. Cardiac Enzymes: Recent Labs  Lab 04/12/24 [id number]  CKTOTAL 91   BNP (last 3 results) No results for input(s): PROBNP in the last 8760 hours. HbA1C: No results for input(s): HGBA1C in the last 72 hours. CBG: No results for input(s): GLUCAP in the last 168 hours. Lipid Profile: No results for input(s): CHOL, HDL, LDLCALC, TRIG, CHOLHDL, LDLDIRECT in the last 72 hours. Thyroid Function Tests: No results for input(s): TSH, T4TOTAL, FREET4, T3FREE, THYROIDAB in the last 72 hours. Anemia Panel: Recent Labs    04/12/24 0755  VITAMINB12 743  FOLATE 15.0  FERRITIN 53  TIBC 347  IRON 29  RETICCTPCT 2.2   Sepsis Labs: No results for input(s): PROCALCITON, LATICACIDVEN in the last 168 hours.  No results found for this or any previous visit (from the past 240 hours).  progre Radiology Studies: MR BRAIN WO CONTRAST Result Date: 04/12/2024 CLINICAL DATA:  Mental status change, unknown cause EXAM: MRI HEAD WITHOUT CONTRAST TECHNIQUE: Multiplanar, multiecho pulse sequences of the brain and surrounding structures were obtained without intravenous contrast. COMPARISON:  CT of the head dated April 11, 2024. FINDINGS: Brain: There is moderate generalized is cerebral volume loss present. There are confluent areas  of increased T2 signal present within the periventricular, deep cerebral and subcortical white matter. There is no evidence of hemorrhage, mass, acute cortical infarct or hydrocephalus. Vascular: Normal flow voids. Skull and upper cervical spine: Normal marrow signal. Sinuses/Orbits: Moderate mucosal disease within the ethmoid, sphenoid and left maxillary sinuses. Status post bilateral lens replacement. Other: None. IMPRESSION: 1. Age-related atrophy and advanced cerebral white matter disease. 2. Moderate paranasal sinus disease. Electronically Signed   By: Evalene Coho M.D.   On: 04/12/2024 14:09   Scheduled Meds:  amLODipine   10 mg Oral Daily   apixaban   5 mg Oral BID   aspirin  EC  81 mg Oral Daily   bisoprolol   5 mg Oral Daily   budesonide -glycopyrrolate-formoterol   2 puff Inhalation BID   buPROPion   150 mg Oral Daily   isosorbide  mononitrate  30 mg Oral Daily   rosuvastatin   5 mg Oral Daily   Continuous Infusions:  azithromycin  500 mg (04/13/24 1809)   cefTRIAXone  (ROCEPHIN )  IV 2 g (04/13/24 1814)     LOS: 2 days    Time spent: 35 mins    Darcel Dawley, MD [facility]   If 7PM-7AM, please contact night-coverage

## 2024-04-14 NOTE — Progress Notes (Signed)
 Notified Dr. Darcel Dawley pt is yellow MEWS, notified CN Vernell Leber, RN. Yellow MEWS protocol implemented. Per Dr. Dawley pt's home meds will resume. No s/s of distress, pt AOx4 and resting in bed. Will continue to monitor.   04/14/24 1239  Vitals  BP (!) 107/44  MAP (mmHg) (!) 58  BP Location Right Arm  BP Method Automatic  Patient Position (if appropriate) Lying  Pulse Rate (!) 156  Pulse Rate Source Monitor  MEWS COLOR  MEWS Score Color Yellow  Oxygen Therapy  SpO2 90 %  O2 Device Nasal Cannula  O2 Flow Rate (L/min) 3 L/min  MEWS Score  MEWS Temp 0  MEWS Systolic 0  MEWS Pulse 3  MEWS RR 0  MEWS LOC 0  MEWS Score 3  Provider Notification  Provider Name/Title Dr. Magdalena Dawley  Date Provider Notified 04/14/24  Time Provider Notified 1248  Method of Notification  (secure chat)  Notification Reason Change in status;Other (Comment) (afib, rvr, hr)  Provider response See new orders  Date of Provider Response 04/14/24  Time of Provider Response 1256

## 2024-04-14 NOTE — Progress Notes (Signed)
 Deanna Petty with [facility] called to get and update on plan of care and possible d'c. Information provided and no possible d'c today.

## 2024-04-14 NOTE — Progress Notes (Signed)
 Patient c/o pain to right side abdomen. Describe it as sharp stabbing pain stated it was 9/10. Notified On call provider see new order.

## 2024-04-14 NOTE — Plan of Care (Signed)
  Problem: Nutrition: Goal: Adequate nutrition will be maintained Outcome: Progressing   Problem: Coping: Goal: Level of anxiety will decrease Outcome: Progressing   Problem: Pain Managment: Goal: General experience of comfort will improve and/or be controlled Outcome: Progressing   Problem: Safety: Goal: Ability to remain free from injury will improve Outcome: Progressing

## 2024-04-14 NOTE — Progress Notes (Signed)
 CCMD called pt converted to Afib RVR HR 145 notified Dr. Darcel Dawley. Pt has hx of Afib. Pt was sleeping, no s/s of distress, pt says, I feel fine. Will continue to monitor

## 2024-04-15 DIAGNOSIS — E860 Dehydration: Principal | ICD-10-CM

## 2024-04-15 DIAGNOSIS — R531 Weakness: Secondary | ICD-10-CM

## 2024-04-15 DIAGNOSIS — J189 Pneumonia, unspecified organism: Principal | ICD-10-CM

## 2024-04-15 LAB — CBC
HCT: 36.5 % (ref 36.0–46.0)
Hemoglobin: 11.1 g/dL — ABNORMAL LOW (ref 12.0–15.0)
MCH: 27.6 pg (ref 26.0–34.0)
MCHC: 30.4 g/dL (ref 30.0–36.0)
MCV: 90.8 fL (ref 80.0–100.0)
Platelets: 235 10*3/uL (ref 150–400)
RBC: 4.02 MIL/uL (ref 3.87–5.11)
RDW: 15 % (ref 11.5–15.5)
WBC: 8.8 10*3/uL (ref 4.0–10.5)
nRBC: 0.2 % (ref 0.0–0.2)

## 2024-04-15 LAB — GLUCOSE, CAPILLARY
Glucose-Capillary: 134 mg/dL — ABNORMAL HIGH (ref 70–99)
Glucose-Capillary: 182 mg/dL — ABNORMAL HIGH (ref 70–99)

## 2024-04-15 LAB — RENAL FUNCTION PANEL
Albumin: 2.6 g/dL — ABNORMAL LOW (ref 3.5–5.0)
Anion gap: 10 (ref 5–15)
BUN: 9 mg/dL (ref 8–23)
CO2: 29 mmol/L (ref 22–32)
Calcium: 8.9 mg/dL (ref 8.9–10.3)
Chloride: 97 mmol/L — ABNORMAL LOW (ref 98–111)
Creatinine, Ser: 0.82 mg/dL (ref 0.44–1.00)
GFR, Estimated: >60 mL/min (ref >60)
Glucose, Bld: 101 mg/dL — ABNORMAL HIGH (ref 70–99)
Phosphorus: 2.6 mg/dL (ref 2.5–4.6)
Potassium: 4.1 mmol/L (ref 3.5–5.1)
Sodium: 136 mmol/L (ref 135–145)

## 2024-04-15 MED ORDER — INSULIN ASPART 100 UNIT/ML IJ SOLN
0.0000 [IU] | Freq: Three times a day (TID) | INTRAMUSCULAR | Status: DC
  Administered 2024-04-15 – 2024-04-16 (×2): 1 [IU] via SUBCUTANEOUS
  Administered 2024-04-16: 2 [IU] via SUBCUTANEOUS

## 2024-04-15 NOTE — Progress Notes (Signed)
 Attempted to reach Deanna Petty at [facility] twice at [phone] to update on pt's d'c plan.

## 2024-04-15 NOTE — TOC CM/SW Note (Addendum)
 Received secure chat : Deanna Petty with [facility] would like a call  back at [phone]. Called left voicemail   Spoke to Deanna Petty with [facility].   Patient from home with caregiver [name].   Patient has oxygen with [facility] already. Patient already has wheelchair.   [facility] will provide transportation home at discharge. They are open this week Monday through Thursday 8 am to 5 pm. Friday they are not providing transportation services. If patient discharged on Friday it will be up to caregiver to arrange transportation.   Also, [facility] will need any prescriptions for new medication faxed to them . Or any medication changes can be listed on discharge summary. [facility] MD has to approve any new medications and send prescription to patient's pharmacy for [facility] to cover cost. Prescriptions and discharge summary can be faxed to [facility] at [phone] .   If patient discharged on Friday , [facility] has an on call nurse and MD. On call phone number is [phone] option 2.   Deanna Petty asking if anticipated discharge date is known.   NCM secure chatted MD   1500 Anticipated discharge date is tomorrow 04/16/24. Patient aware and confirmed above.  Deanna Petty at [facility] aware . They will need to be called in morning once discharge is confirmed. They will call [name] and bring wheelchair and portable oxygen tank when they can to transport patient home. PT /OT recommended to continue OP PT/OT at [facility]. Deanna Petty requested todays MD note, PT/OT notes be faxed to her at [phone], same done

## 2024-04-15 NOTE — Progress Notes (Signed)
 [facility]                                                                              Deanna Petty, is a 78 y.o. female, DOB - 05-30-1946, FMW:[id number] Admit date - 04/11/2024    Outpatient Primary MD for the patient is [name], NP-C  LOS - 3  days  Chief Complaint  Patient presents with   Weakness       Brief summary   Patient is a 78 yrs old female with COPD on 3 L oxygen at baseline, hypertension, paroxysmal A-fib, nonobstructive CAD, depression, recent diagnosis of lung cancer receiving radiation, last therapy was on 04/04/2024 was brought to the ER after patient was becoming more weak,  fatigued and short of breath.  Patient's family also noticed that patient was getting confused.  Patient denies any chest pain or productive cough. In the ED VBG shows pH of 7.38 PCO2 of 56.2.  Creatinine 1.0, troponin 20 and 19.  EKG shows normal sinus rhythm.   WBC 10.9 with chest x-ray showing features concerning for possible pneumonia.  CT head was unremarkable.  Patient was empirically started on antibiotics for pneumonia.  IV fluids for dehydration.  Patient was admitted for further evaluation.    Assessment & Plan    Fatigue and weakness: -Possibly due to dehydration, poor p.o. intake, pneumonia -CT head > No acute intracranial abnormality.  Continue IV fluid resuscitation. -CK 91, TSH and ammonia level > normal. -MRI brain showed  age-related atrophy, no acute abnormality found. -2D echo:  LVEF 75% hyperdynamic circulation no RWMA -PT OT evaluation recommended outpatient PT OT   Suspected pneumonia: Chest x-ray shows findings concerning for pneumonia. -Continue IV Zithromax , Rocephin     Essential hypertension: - BP stable, continue amlodipine , Imdur   -Holding losartan  due to mild AKI    Acute renal failure with hyponatremia: Last creatinine in 2023 was normal.  Sodium 131, creatinine 1.09 on 2 admission -Placed on gentle hydration, Cozaar , Lasix   held - Sodium 136, creatinine 0.82  Paroxysmal atrial fibrillation: Heart rate well-controlled, continue Eliquis .  Resume Bisoprolol .   History of nonobstructive CAD: Continue aspirin , statins and Imdur .   Normocytic Anemia: -% saturation ratio 8, ferritin 53 folate 15.0, B12 743  - H&H stable   Lung cancer:  She is receiving radiation therapy. F/u Oncology as an outpatient.   COPD on 3 L oxygen; -Stable no acute wheezing, continue Breztri and Xopenex .   Depression : Continue Wellbutrin .  Hold Xanax.   Hyperlipidemia : Continue statins.   Diabetes mellitus type 2 : She takes metformin  and Farxiga .   Follow hemoglobin A1c Placed on sliding scale insulin  while inpatient  Obesity class I Estimated body mass index is 32.92 kg/m as calculated from the following:   Height as of this encounter: 5' 2 (1.575 m).   Weight as of this encounter: 81.6 kg.  Code Status: Full code DVT Prophylaxis:   apixaban  (ELIQUIS ) tablet 5 mg   Level of Care: Level of care: Telemetry Medical Family Communication: Updated patient Disposition Plan:      Remains inpatient appropriate: Hopefully DC home tomorrow   Procedures:  Consultants:     Antimicrobials:   Anti-infectives (From admission, onward)    Start     Dose/Rate Route Frequency Ordered Stop   04/12/24 1800  cefTRIAXone  (ROCEPHIN ) 2 g in sodium chloride  0.9 % 100 mL IVPB        2 g 200 mL/hr over 30 Minutes Intravenous Every 24 hours 04/11/24 2335 04/17/24 1759   04/12/24 1800  azithromycin  (ZITHROMAX ) 500 mg in sodium chloride  0.9 % 250 mL IVPB        500 mg 250 mL/hr over 60 Minutes Intravenous Every 24 hours 04/11/24 2335 04/17/24 1759   04/11/24 1715  cefTRIAXone  (ROCEPHIN ) 1 g in sodium chloride  0.9 % 100 mL IVPB  Status:  Discontinued        1 g 200 mL/hr over 30 Minutes Intravenous Every 24 hours 04/11/24 1706 04/12/24 0005   04/11/24 1715  azithromycin  (ZITHROMAX ) 500 mg in sodium chloride  0.9 % 250 mL IVPB         500 mg 250 mL/hr over 60 Minutes Intravenous  Once 04/11/24 1706 04/11/24 2101          Medications  amLODipine   10 mg Oral Daily   apixaban   5 mg Oral BID   aspirin  EC  81 mg Oral Daily   bisoprolol   5 mg Oral Daily   budesonide -glycopyrrolate-formoterol   2 puff Inhalation BID   buPROPion   150 mg Oral Daily   isosorbide  mononitrate  30 mg Oral Daily   rosuvastatin   5 mg Oral Daily      Subjective:   [name] was seen and examined today.  No acute issues, no fevers or chills, cough, chest pain or shortness of breath.  No acute events overnight.  On 3 L O2, feels weak and tired, improving.  Objective:   Vitals:   04/15/24 0332 04/15/24 0637 04/15/24 0753 04/15/24 0942  BP: (!) 124/52 (!) 135/49 (!) 133/53 (!) 133/53  Pulse: 72 78 74   Resp:   18   Temp: 98.1 F (36.7 C) 98.1 F (36.7 C) 98.2 F (36.8 C)   TempSrc: Oral Oral Oral   SpO2: 92% 90% 95%   Weight:      Height:        Intake/Output Summary (Last 24 hours) at 04/15/2024 1447 Last data filed at 04/15/2024 1226 Gross per 24 hour  Intake 291.83 ml  Output 2450 ml  Net -2158.17 ml     Wt Readings from Last 3 Encounters:  04/11/24 81.6 kg  03/19/24 82.8 kg  01/25/24 82.7 kg     Exam General: Alert and oriented x 3, NAD Cardiovascular: S1 S2 auscultated,  RRR Respiratory: Clear to auscultation bilaterally, no wheezing Gastrointestinal: Soft, nontender, nondistended, + bowel sounds Ext: no pedal edema bilaterally Neuro: no new deficits Psych: Normal affect     Data Reviewed:  I have personally reviewed following labs    CBC Lab Results  Component Value Date   WBC 8.8 04/15/2024   RBC 4.02 04/15/2024   HGB 11.1 (L) 04/15/2024   HCT 36.5 04/15/2024   MCV 90.8 04/15/2024   MCH 27.6 04/15/2024   PLT 235 04/15/2024   MCHC 30.4 04/15/2024   RDW 15.0 04/15/2024   LYMPHSABS 0.6 (L) 01/12/2023   MONOABS 0.0 (L) 01/12/2023   EOSABS 0.0 01/12/2023   BASOSABS 0.0 01/12/2023      Last metabolic panel Lab Results  Component Value Date   NA 136 04/15/2024   K 4.1 04/15/2024   CL 97 (L)  04/15/2024   CO2 29 04/15/2024   BUN 9 04/15/2024   CREATININE 0.82 04/15/2024   GLUCOSE 101 (H) 04/15/2024   GFRNONAA >60 04/15/2024   GFRAA >60 07/06/2020   CALCIUM  8.9 04/15/2024   PHOS 2.6 04/15/2024   PROT 7.2 04/12/2024   ALBUMIN 2.6 (L) 04/15/2024   BILITOT 0.5 04/12/2024   ALKPHOS 73 04/12/2024   AST 17 04/12/2024   ALT 14 04/12/2024   ANIONGAP 10 04/15/2024    CBG (last 3)  No results for input(s): GLUCAP in the last 72 hours.    Coagulation Profile: No results for input(s): INR, PROTIME in the last 168 hours.   Radiology Studies: I have personally reviewed the imaging studies  No results found.     Nydia Distance M.D. [facility] 04/15/2024, 2:47 PM  Available via Epic secure chat 7am-7pm After 7 pm, please refer to night coverage provider listed on amion.

## 2024-04-15 NOTE — Evaluation (Signed)
 Occupational Therapy Evaluation Patient Details Name: Deanna Petty MRN: [id number] DOB: 14-Feb-1946 Today's Date: 04/15/2024   History of Present Illness   Pt is a 78 y/o female presenting with progressive weakness, SOB and AMS. Admitted for IV fluids for dehydration and antibiotics for PNA. PMH: COPD, DM2, chronic diastolic CHF, MI, HTN, lung cancer, anemia, HLD     Clinical Impressions PTA, pt lives with roommates, typically uses wheelchair for mobility and has assistance for ADLs/IADLs. Pt reports actively working with OP OT & PT. Pt presents now fairly close to this reported baseline w/ minor deficits in strength, balance and endurance. Pt able to progress to Min A for transfers using RW, Setup-Min A for UB ADL and Mod A for LB ADLs. Pt reports adequate assist at home and hopeful to continue working with PT/OT upon discharge. Pt denies need for additional DME at this time. Will continue to follow acutely.      If plan is discharge home, recommend the following:   A little help with walking and/or transfers;A lot of help with bathing/dressing/bathroom;Assistance with cooking/housework;Help with stairs or ramp for entrance     Functional Status Assessment   Patient has had a recent decline in their functional status and demonstrates the ability to make significant improvements in function in a reasonable and predictable amount of time.     Equipment Recommendations   None recommended by OT     Recommendations for Other Services         Precautions/Restrictions   Precautions Precautions: Fall Restrictions Weight Bearing Restrictions Per Provider Order: No     Mobility Bed Mobility               General bed mobility comments: received EOB with PT    Transfers Overall transfer level: Needs assistance Equipment used: Rolling walker (2 wheels) Transfers: Sit to/from Stand, Bed to chair/wheelchair/BSC Sit to Stand: Min assist, +2 physical assistance, +2  safety/equipment     Step pivot transfers: Min assist, +2 safety/equipment     General transfer comment: Min A x 2 to power up at bedside with RW, reliant on BUE support on RW but able to progress to Min A x 2 for safety to step to recliner      Balance Overall balance assessment: Needs assistance Sitting-balance support: No upper extremity supported, Feet supported Sitting balance-Leahy Scale: Fair     Standing balance support: Bilateral upper extremity supported, During functional activity Standing balance-Leahy Scale: Poor Standing balance comment: reliant on BUE in standing or external assist if only one UE supported                           ADL either performed or assessed with clinical judgement   ADL Overall ADL's : Needs assistance/impaired Eating/Feeding: Modified independent;Sitting   Grooming: Set up;Sitting;Wash/dry face;Brushing hair   Upper Body Bathing: Minimal assistance;Sitting   Lower Body Bathing: Moderate assistance;Sitting/lateral leans;Sit to/from stand Lower Body Bathing Details (indicate cue type and reason): with external assist for balance and clothing mgmt, pt able to use one UE to bathe anterior peri region while assisted with posterior region Upper Body Dressing : Set up;Sitting   Lower Body Dressing: Moderate assistance;Sitting/lateral leans;Sit to/from stand   Toilet Transfer: Minimal assistance;Stand-pivot;BSC/3in1;Rolling walker (2 wheels)   Toileting- Clothing Manipulation and Hygiene: Moderate assistance;Sitting/lateral lean;Sit to/from stand               Vision Baseline Vision/History: 1 Wears glasses Ability  to See in Adequate Light: 0 Adequate Patient Visual Report: No change from baseline Vision Assessment?: No apparent visual deficits;Wears glasses for reading     Perception         Praxis         Pertinent Vitals/Pain Pain Assessment Pain Assessment: No/denies pain     Extremity/Trunk Assessment  Upper Extremity Assessment Upper Extremity Assessment: Right hand dominant;Generalized weakness   Lower Extremity Assessment Lower Extremity Assessment: Defer to PT evaluation   Cervical / Trunk Assessment Cervical / Trunk Assessment: Normal   Communication Communication Communication: No apparent difficulties   Cognition Arousal: Alert Behavior During Therapy: WFL for tasks assessed/performed Cognition: No apparent impairments                               Following commands: Intact       Cueing  General Comments   Cueing Techniques: Verbal cues      Exercises     Shoulder Instructions      Home Living Family/patient expects to be discharged to:: Private residence Living Arrangements: Non-relatives/Friends Available Help at Discharge: Friend(s) Type of Home: House Home Access: Stairs to enter Secretary/administrator of Steps: 5-6 Entrance Stairs-Rails: Right;Left Home Layout: One level     Bathroom Shower/Tub: Chief Strategy Officer: Handicapped height     Home Equipment: Agricultural consultant (2 wheels);BSC/3in1;Wheelchair - manual;Cane - quad;Tub bench          Prior Functioning/Environment Prior Level of Function : Needs assist             Mobility Comments: mostly in wheelchair for mobility, has been working with OP PT on gait ADLs Comments: Assist with showering tasks, shower transfers, LB dressing as needed and IADls in the home    OT Problem List: Decreased strength;Decreased activity tolerance;Impaired balance (sitting and/or standing)   OT Treatment/Interventions: Self-care/ADL training;Therapeutic exercise;Energy conservation;DME and/or AE instruction;Therapeutic activities;Patient/family education;Balance training      OT Goals(Current goals can be found in the care plan section)   Acute Rehab OT Goals Patient Stated Goal: keep working on walking OT Goal Formulation: With patient Time For Goal Achievement:  04/29/24 Potential to Achieve Goals: Good ADL Goals Pt Will Transfer to Toilet: with modified independence;stand pivot transfer;bedside commode Pt Will Perform Toileting - Clothing Manipulation and hygiene: with modified independence;sit to/from stand;sitting/lateral leans Pt/caregiver will Perform Home Exercise Program: Increased strength;Both right and left upper extremity;With theraband;Independently;With written HEP provided   OT Frequency:  Min 2X/week    Co-evaluation              AM-PAC OT 6 Clicks Daily Activity     Outcome Measure Help from another person eating meals?: None Help from another person taking care of personal grooming?: A Little Help from another person toileting, which includes using toliet, bedpan, or urinal?: A Lot Help from another person bathing (including washing, rinsing, drying)?: A Lot Help from another person to put on and taking off regular upper body clothing?: A Little Help from another person to put on and taking off regular lower body clothing?: A Lot 6 Click Score: 16   End of Session Equipment Utilized During Treatment: Gait belt;Rolling walker (2 wheels);Oxygen Nurse Communication: Mobility status  Activity Tolerance: Patient tolerated treatment well Patient left: in chair;with call bell/phone within reach;with chair alarm set  OT Visit Diagnosis: Unsteadiness on feet (R26.81);Other abnormalities of gait and mobility (R26.89);Muscle weakness (generalized) (M62.81)  Time: 8856-8841 OT Time Calculation (min): 15 min Charges:  OT General Charges $OT Visit: 1 Visit OT Evaluation $OT Eval Low Complexity: 1 Low  Mliss NOVAK, OTR/L Acute Rehab Services Office: [phone]   Mliss Fish 04/15/2024, 1:24 PM

## 2024-04-15 NOTE — Plan of Care (Signed)

## 2024-04-15 NOTE — Evaluation (Signed)
 Physical Therapy Evaluation  Patient Details Name: Deanna Petty MRN: [id number] DOB: 12-May-1946 Today's Date: 04/15/2024  History of Present Illness  Pt is a 78 y/o female presenting with progressive weakness, SOB and AMS. Admitted for IV fluids for dehydration and antibiotics for PNA. PMH: COPD, DM2, chronic diastolic CHF, MI, HTN, lung cancer, anemia, HLD  Clinical Impression  Pt admitted with above diagnosis. Pt currently with functional limitations due to the deficits listed below (see PT Problem List). At the time of PT eval pt was able to perform transfers with gross min assist (+2 mainly for safety). Pt reports she is typically in her wheelchair at home, and is working towards ambulation with OPPT. Recommend resuming outpatient services at d/c, as pt appears to be near baseline of function. Pt will benefit from acute skilled PT to increase their independence and safety with mobility to allow discharge.           If plan is discharge home, recommend the following: A little help with walking and/or transfers;A little help with bathing/dressing/bathroom;Assistance with cooking/housework;Assist for transportation;Help with stairs or ramp for entrance   Can travel by private vehicle        Equipment Recommendations None recommended by PT  Recommendations for Other Services       Functional Status Assessment Patient has had a recent decline in their functional status and demonstrates the ability to make significant improvements in function in a reasonable and predictable amount of time.     Precautions / Restrictions Precautions Precautions: Fall Restrictions Weight Bearing Restrictions Per Provider Order: No      Mobility  Bed Mobility Overal bed mobility: Needs Assistance Bed Mobility: Rolling, Sidelying to Sit Rolling: Min assist Sidelying to sit: Min assist       General bed mobility comments: Light assist for transition to EOB. Good use of rails.     Transfers Overall transfer level: Needs assistance Equipment used: Rolling walker (2 wheels) Transfers: Sit to/from Stand, Bed to chair/wheelchair/BSC Sit to Stand: Min assist, +2 physical assistance, +2 safety/equipment   Step pivot transfers: Min assist, +2 safety/equipment       General transfer comment: Min A x 2 to power up at bedside with RW. While in standing, pt able to perform peri hygiene from the front. PT assisted with peri hygiene at the back. Pt reliant on BUE support on RW but able to progress to Min A x 2 for safety to step to recliner.    Ambulation/Gait               General Gait Details: Did not progress to gait training at this time.  Stairs            Wheelchair Mobility     Tilt Bed    Modified Rankin (Stroke Patients Only)       Balance Overall balance assessment: Needs assistance Sitting-balance support: No upper extremity supported, Feet supported Sitting balance-Leahy Scale: Fair     Standing balance support: Bilateral upper extremity supported, During functional activity Standing balance-Leahy Scale: Poor Standing balance comment: reliant on BUE in standing or external assist if only one UE supported                             Pertinent Vitals/Pain Pain Assessment Pain Assessment: No/denies pain    Home Living Family/patient expects to be discharged to:: Private residence Living Arrangements: Non-relatives/Friends Available Help at Discharge: Friend(s) Type of Home:  House Home Access: Stairs to enter Entrance Stairs-Rails: Doctor, general practice of Steps: 5-6   Home Layout: One level Home Equipment: Agricultural consultant (2 wheels);BSC/3in1;Wheelchair - manual;Cane - quad;Tub bench      Prior Function Prior Level of Function : Needs assist             Mobility Comments: mostly in wheelchair for mobility, has been working with OP PT on gait ADLs Comments: Assist with showering tasks, shower  transfers, LB dressing as needed and IADls in the home     Extremity/Trunk Assessment   Upper Extremity Assessment Upper Extremity Assessment: Right hand dominant;Generalized weakness    Lower Extremity Assessment Lower Extremity Assessment: Generalized weakness (Functional weakness. Pt with gross 4 to 4+/5 strength in quads, hamstrings. Noted flexion contracture in B knees - likely due to sitting in wheelchair often and tight hamstrings.)    Cervical / Trunk Assessment Cervical / Trunk Assessment: Other exceptions Cervical / Trunk Exceptions: Forward head posture with rounded shoulders  Communication   Communication Communication: No apparent difficulties    Cognition Arousal: Alert Behavior During Therapy: WFL for tasks assessed/performed                             Following commands: Intact       Cueing Cueing Techniques: Verbal cues     General Comments      Exercises     Assessment/Plan    PT Assessment Patient needs continued PT services  PT Problem List Decreased strength;Decreased activity tolerance;Decreased range of motion;Decreased balance;Decreased mobility;Decreased knowledge of use of DME;Decreased safety awareness       PT Treatment Interventions DME instruction;Gait training;Stair training;Functional mobility training;Therapeutic activities;Therapeutic exercise;Balance training;Patient/family education    PT Goals (Current goals can be found in the Care Plan section)  Acute Rehab PT Goals Patient Stated Goal: Be able to return home PT Goal Formulation: With patient Time For Goal Achievement: 04/22/24 Potential to Achieve Goals: Good    Frequency Min 2X/week     Co-evaluation               AM-PAC PT 6 Clicks Mobility  Outcome Measure Help needed turning from your back to your side while in a flat bed without using bedrails?: A Little Help needed moving from lying on your back to sitting on the side of a flat bed without  using bedrails?: A Little Help needed moving to and from a bed to a chair (including a wheelchair)?: A Lot Help needed standing up from a chair using your arms (e.g., wheelchair or bedside chair)?: A Lot Help needed to walk in hospital room?: Total Help needed climbing 3-5 steps with a railing? : Total 6 Click Score: 12    End of Session Equipment Utilized During Treatment: Gait belt;Oxygen Activity Tolerance: Patient tolerated treatment well Patient left: with call bell/phone within reach;in chair;with chair alarm set Nurse Communication: Mobility status PT Visit Diagnosis: Muscle weakness (generalized) (M62.81);Difficulty in walking, not elsewhere classified (R26.2)    Time: 8869-8841 PT Time Calculation (min) (ACUTE ONLY): 28 min   Charges:   PT Evaluation $PT Eval Low Complexity: 1 Low   PT General Charges $$ ACUTE PT VISIT: 1 Visit         Leita Sable, PT, DPT Acute Rehabilitation Services Secure Chat Preferred Office: [phone]   Leita JONETTA Sable 04/15/2024, 1:41 PM

## 2024-04-16 DIAGNOSIS — J189 Pneumonia, unspecified organism: Secondary | ICD-10-CM | POA: Diagnosis not present

## 2024-04-16 LAB — CBC
HCT: 38 % (ref 36.0–46.0)
Hemoglobin: 11.8 g/dL — ABNORMAL LOW (ref 12.0–15.0)
MCH: 27.7 pg (ref 26.0–34.0)
MCHC: 31.1 g/dL (ref 30.0–36.0)
MCV: 89.2 fL (ref 80.0–100.0)
Platelets: 234 10*3/uL (ref 150–400)
RBC: 4.26 MIL/uL (ref 3.87–5.11)
RDW: 15 % (ref 11.5–15.5)
WBC: 9.3 10*3/uL (ref 4.0–10.5)
nRBC: 0 % (ref 0.0–0.2)

## 2024-04-16 LAB — RENAL FUNCTION PANEL
Albumin: 2.9 g/dL — ABNORMAL LOW (ref 3.5–5.0)
Anion gap: 8 (ref 5–15)
BUN: 6 mg/dL — ABNORMAL LOW (ref 8–23)
CO2: 29 mmol/L (ref 22–32)
Calcium: 9.2 mg/dL (ref 8.9–10.3)
Chloride: 100 mmol/L (ref 98–111)
Creatinine, Ser: 0.75 mg/dL (ref 0.44–1.00)
GFR, Estimated: >60 mL/min (ref >60)
Glucose, Bld: 126 mg/dL — ABNORMAL HIGH (ref 70–99)
Phosphorus: 3.1 mg/dL (ref 2.5–4.6)
Potassium: 4.5 mmol/L (ref 3.5–5.1)
Sodium: 137 mmol/L (ref 135–145)

## 2024-04-16 LAB — GLUCOSE, CAPILLARY
Glucose-Capillary: 143 mg/dL — ABNORMAL HIGH (ref 70–99)
Glucose-Capillary: 165 mg/dL — ABNORMAL HIGH (ref 70–99)

## 2024-04-16 LAB — HEMOGLOBIN A1C
Hgb A1c MFr Bld: 7.7 % — ABNORMAL HIGH (ref 4.8–5.6)
Mean Plasma Glucose: 174.29 mg/dL

## 2024-04-16 MED ORDER — AMOXICILLIN-POT CLAVULANATE 875-125 MG PO TABS
1.0000 | ORAL_TABLET | Freq: Two times a day (BID) | ORAL | Status: DC
  Administered 2024-04-16: 1 via ORAL
  Filled 2024-04-16: qty 1

## 2024-04-16 MED ORDER — AMOXICILLIN-POT CLAVULANATE 875-125 MG PO TABS: 1.0000 | ORAL_TABLET | Freq: Two times a day (BID) | ORAL | 0 refills | Status: DC

## 2024-04-16 MED ORDER — AMLODIPINE BESYLATE 5 MG PO TABS
5.0000 mg | ORAL_TABLET | Freq: Every day | ORAL | 2 refills | Status: AC
Start: 2024-04-16 — End: ?

## 2024-04-16 MED ORDER — AMLODIPINE BESYLATE 5 MG PO TABS
5.0000 mg | ORAL_TABLET | Freq: Every day | ORAL | Status: DC
  Administered 2024-04-16: 5 mg via ORAL
  Filled 2024-04-16: qty 1

## 2024-04-16 MED ORDER — DICLOFENAC SODIUM 1 % EX GEL: 4.0000 g | Freq: Four times a day (QID) | CUTANEOUS | 2 refills | Status: DC

## 2024-04-16 MED ORDER — DICLOFENAC SODIUM 1 % EX GEL
2.0000 g | Freq: Four times a day (QID) | CUTANEOUS | Status: DC
  Administered 2024-04-16: 2 g via TOPICAL
  Filled 2024-04-16: qty 100

## 2024-04-16 MED ORDER — AZITHROMYCIN 250 MG PO TABS: 500.0000 mg | ORAL_TABLET | Freq: Once | ORAL | Status: DC

## 2024-04-16 NOTE — Progress Notes (Signed)
 DISCHARGE NOTE HOME Deanna Petty to be discharged Home per MD order. Discussed prescriptions and follow up appointments with the patient. Prescriptions given to patient; medication list explained in detail. Patient verbalized understanding.  Skin clean, dry and intact without evidence of skin break down, no evidence of skin tears noted. IV catheter discontinued intact. Site without signs and symptoms of complications. Dressing and pressure applied. Pt denies pain at the site currently. No complaints noted.  Patient free of lines, drains, and wounds.   An After Visit Summary (AVS) was printed and given to the patient. Patient escorted via wheelchair, and discharged home via private auto.  Peyton SHAUNNA Pepper, RN

## 2024-04-16 NOTE — Plan of Care (Signed)

## 2024-04-16 NOTE — Discharge Summary (Signed)
 Physician Discharge Summary  Deanna Petty FMW:[id number] DOB: 04-06-1946 DOA: 04/11/2024  PCP: [name], NP-C  Admit date: 04/11/2024 Discharge date: 04/16/2024  Admitted From: home Disposition:  home Recommendations for Outpatient Follow-up:  Follow up with PCP in 1-2 weeks Please obtain BMP/CBC in one week  Home Health:yes Equipment/Devices:none  Discharge Condition:stable CODE STATUS:full Diet recommendation:cardiac Brief/Interim Summary: 78 yrs old female with COPD on 3 L oxygen at baseline, hypertension, paroxysmal A-fib, nonobstructive CAD, depression, recent diagnosis of lung cancer receiving radiation, last therapy was on 04/04/2024 was brought to the ER after patient was becoming more weak,  fatigued and short of breath.  Patient's family also noticed that patient was getting confused.  Patient denies any chest pain or productive cough. In the ED VBG shows pH of 7.38 PCO2 of 56.2.  Creatinine 1.0, troponin 20 and 19.  EKG shows normal sinus rhythm.   WBC 10.9 with chest x-ray showing features concerning for possible pneumonia.  CT head was unremarkable.  Patient was empirically started on antibiotics for pneumonia.  IV fluids for dehydration.  Patient was admitted for further evaluation.   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 intake, pneumonia -CT head > No acute intracranial  abnormality.  Continue IV fluid resuscitation. -CK 91, TSH and ammonia level > normal. -MRI brain showed  age-related atrophy, no acute abnormality found. -2D echo:  LVEF 75% hyperdynamic circulation no RWMA -PT OT evaluation recommended outpatient PT OT   Suspected pneumonia: Chest x-ray shows findings concerning for pneumonia. - She was treated with IV Zithromax , Rocephin   -Discharged on Augmentin  for 3 more days   Review essential hypertension: - BP stable, continue amlodipine , Imdur   -Holding losartan  due to mild AKI  -Lasix  and cozar on hold on discharge please restart them as necessary as an outpatient   Acute renal failure with hyponatremia: Resolved Last creatinine in 2023 was normal.  Sodium 131, creatinine 1.09 on 2 admission -Placed on gentle hydration, Cozaar , Lasix  held - Sodium 136, creatinine 0.82   Paroxysmal atrial fibrillation: Heart rate well-controlled, continue Eliquis .  Continue bisoprolol    History of nonobstructive CAD: Continue aspirin , statins and Imdur .   Normocytic Anemia: -% saturation ratio 8, ferritin 53 folate 15.0, B12 743  - H&H stable   Lung cancer:  She is receiving radiation therapy. F/u Oncology as an outpatient.   COPD on 3 L oxygen; -Stable no acute wheezing, continue Breztri and Xopenex .   Depression : Continue Wellbutrin .  Hold Xanax.   Hyperlipidemia : Continue statins.  Diabetes mellitus type 2 : She takes metformin  and Farxiga .   Follow hemoglobin A1c 7.7   Obesity class I Estimated body mass index is 32.92 kg/m as calculated from the following:   Height as of this encounter: 5' 2 (1.575 m).   Weight as of this encounter: 81.6 kg.   Estimated body mass index is 32.92 kg/m as calculated from the following:   Height  as of this encounter: 5' 2 (1.575 m).   Weight as of this encounter: 81.6 kg.  Discharge Instructions  Discharge Instructions     Diet - low sodium heart healthy   Complete by: As directed     Increase activity slowly   Complete by: As directed       Allergies as of 04/16/2024   No Known Allergies      Medication List     STOP taking these medications    furosemide  40 MG tablet Commonly known as: LASIX    losartan  25 MG tablet Commonly known as: COZAAR        TAKE these medications    acetaminophen  500 MG tablet Commonly known as: TYLENOL  Take 500 mg by mouth every 6 (six) hours as needed for mild pain (pain score 1-3) or moderate pain (pain score 4-6).   albuterol  108 (90 Base) MCG/ACT inhaler Commonly known as: VENTOLIN  HFA Inhale 1 puff into the lungs every 6 (six) hours as needed for wheezing or shortness of breath.   ALPRAZolam 0.5 MG tablet Commonly known as: XANAX Take 0.5 mg by mouth at bedtime as needed for anxiety.   amLODipine  5 MG tablet Commonly known as: NORVASC  Take 1 tablet (5 mg total) by mouth daily. What changed:  medication strength how much to take   amoxicillin -clavulanate 875-125 MG tablet Commonly known as: AUGMENTIN  Take 1 tablet by mouth 2 (two) times daily.   ascorbic acid 500 MG tablet Commonly known as: VITAMIN C Take 500 mg by mouth daily.   Aspirin  Low Dose 81 MG tablet Generic drug: aspirin  EC Take 1 tablet (81 mg total) by mouth daily. Swallow whole.   bisoprolol  5 MG tablet Commonly known as: ZEBETA  Take 1 tablet (5 mg total) by mouth daily.   buPROPion  150 MG 24 hr tablet Commonly known as: WELLBUTRIN  XL Take 150 mg by mouth daily.   CALCIUM  + VITAMIN D3 PO Take 1 tablet by mouth 2 (two) times daily.   cetirizine 10 MG chewable tablet Commonly known as: ZYRTEC Chew 10 mg by mouth daily.   cyclobenzaprine 10 MG tablet Commonly known as: FLEXERIL Take 10 mg by mouth 3 (three) times daily as needed for muscle spasms.   Eliquis  5 MG Tabs tablet Generic drug: apixaban  Take 1 tablet (5 mg total) by mouth 2 (two) times daily.   Farxiga  10 MG Tabs tablet Generic drug: dapagliflozin  propanediol Take 1  tablet (10 mg total) by mouth daily.   fluticasone  50 MCG/ACT nasal spray Commonly known as: FLONASE  Place 1-2 sprays into both nostrils daily as needed for allergies.   isosorbide  mononitrate 30 MG 24 hr tablet Commonly known as: IMDUR  Take 30 mg by mouth daily.   levalbuterol  0.63 MG/3ML nebulizer solution Commonly known as: Xopenex  Take 3 mLs (0.63 mg total) by nebulization every 4 (four) hours as needed for wheezing or shortness of breath.   metFORMIN  1000 MG tablet Commonly known as: GLUCOPHAGE  Take 1,000 mg by mouth daily.   metoprolol  succinate 25 MG 24 hr tablet Commonly known as: TOPROL -XL Take 25 mg by mouth daily.   rosuvastatin  5 MG tablet Commonly known as: CRESTOR  Take 5 mg by mouth daily.   Trelegy Ellipta  100-62.5-25 MCG/ACT Aepb Generic drug: Fluticasone -Umeclidin-Vilant Inhale 1 puff into the lungs daily.        No Known Allergies  Consultations:none   Procedures/Studies: MR BRAIN WO CONTRAST Result Date: 04/12/2024 CLINICAL DATA:  Mental status change, unknown cause EXAM: MRI HEAD WITHOUT CONTRAST TECHNIQUE:  Multiplanar, multiecho pulse sequences of the brain and surrounding structures were obtained without intravenous contrast. COMPARISON:  CT of the head dated April 11, 2024. FINDINGS: Brain: There is moderate generalized is cerebral volume loss present. There are confluent areas of increased T2 signal present within the periventricular, deep cerebral and subcortical white matter. There is no evidence of hemorrhage, mass, acute cortical infarct or hydrocephalus. Vascular: Normal flow voids. Skull and upper cervical spine: Normal marrow signal. Sinuses/Orbits: Moderate mucosal disease within the ethmoid, sphenoid and left maxillary sinuses. Status post bilateral lens replacement. Other: None. IMPRESSION: 1. Age-related atrophy and advanced cerebral white matter disease. 2. Moderate paranasal sinus disease. Electronically Signed   By: Evalene Coho M.D.    On: 04/12/2024 14:09   ECHOCARDIOGRAM COMPLETE Result Date: 04/12/2024    ECHOCARDIOGRAM REPORT   Patient Name:   Deanna Petty Date of Exam: 04/12/2024 Medical Rec #:  [id number]     Height:       62.0 in Accession #:    [id number]    Weight:       180.0 lb Date of Birth:  Jan 14, 1946    BSA:          1.828 m Patient Age:    77 years      BP:           138/57 mmHg Patient Gender: F             HR:           93 bpm. Exam Location:  Inpatient Procedure: 2D Echo, Cardiac Doppler, Color Doppler and Intracardiac            Opacification Agent (Both Spectral and Color Flow Doppler were            utilized during procedure). Indications:    SOB  History:        Patient has prior history of Echocardiogram examinations. CHF;                 Risk Factors:Hypertension.  Sonographer:    Bari Roar Referring Phys: [id number] ARSHAD N KAKRAKANDY IMPRESSIONS  1. Left ventricular ejection fraction, by estimation, is >75%. The left ventricle has hyperdynamic function. The left ventricle has no regional wall motion abnormalities. There is mild concentric left ventricular hypertrophy. Left ventricular diastolic parameters are consistent with Grade I diastolic dysfunction (impaired relaxation).  2. Right ventricular systolic function is normal. The right ventricular size is normal.  3. Left atrial size was mildly dilated.  4. The mitral valve is normal in structure. Trivial mitral valve regurgitation. No evidence of mitral stenosis.  5. The aortic valve was not well visualized. There is moderate calcification of the aortic valve. Aortic valve regurgitation is not visualized. Moderate aortic valve stenosis. Aortic valve area, by VTI measures 1.21 cm. Aortic valve mean gradient measures 21.5 mmHg. Aortic valve Vmax measures 2.93 m/s.  6. The inferior vena cava is normal in size with greater than 50% respiratory variability, suggesting right atrial pressure of 3 mmHg. FINDINGS  Left Ventricle: Left ventricular ejection fraction, by  estimation, is >75%. The left ventricle has hyperdynamic function. The left ventricle has no regional wall motion abnormalities. Definity  contrast agent was given IV to delineate the left ventricular endocardial borders. The left ventricular internal cavity size was normal in size. There is mild concentric left ventricular hypertrophy. Left ventricular diastolic parameters are consistent with Grade I diastolic dysfunction (impaired relaxation). Right Ventricle: The right ventricular size is normal. No increase in  right ventricular wall thickness. Right ventricular systolic function is normal. Left Atrium: Left atrial size was mildly dilated. Right Atrium: Right atrial size was normal in size. Pericardium: There is no evidence of pericardial effusion. Mitral Valve: The mitral valve is normal in structure. Mild mitral annular calcification. Trivial mitral valve regurgitation. No evidence of mitral valve stenosis. MV peak gradient, 12.2 mmHg. The mean mitral valve gradient is 7.0 mmHg. Tricuspid Valve: The tricuspid valve is normal in structure. Tricuspid valve regurgitation is trivial. No evidence of tricuspid stenosis. Aortic Valve: The aortic valve was not well visualized. There is moderate calcification of the aortic valve. Aortic valve regurgitation is not visualized. Moderate aortic stenosis is present. Aortic valve mean gradient measures 21.5 mmHg. Aortic valve peak gradient measures 34.3 mmHg. Aortic valve area, by VTI measures 1.21 cm. Pulmonic Valve: The pulmonic valve was normal in structure. Pulmonic valve regurgitation is mild. No evidence of pulmonic stenosis. Aorta: The aortic root is normal in size and structure. Venous: The inferior vena cava is normal in size with greater than 50% respiratory variability, suggesting right atrial pressure of 3 mmHg. IAS/Shunts: No atrial level shunt detected by color flow Doppler.  LEFT VENTRICLE PLAX 2D LVIDd:         4.40 cm   Diastology LVIDs:         3.00 cm   LV  e' medial:    4.35 cm/s LV PW:         1.10 cm   LV E/e' medial:  29.9 LV IVS:        1.30 cm   LV e' lateral:   7.18 cm/s LVOT diam:     1.80 cm   LV E/e' lateral: 18.1 LV SV:         64 LV SV Index:   35 LVOT Area:     2.54 cm  RIGHT VENTRICLE RV Basal diam:  2.80 cm RV Mid diam:    2.50 cm RV S prime:     12.80 cm/s TAPSE (M-mode): 2.0 cm LEFT ATRIUM             Index        RIGHT ATRIUM           Index LA diam:        3.40 cm 1.86 cm/m   RA Area:     13.90 cm LA Vol (A2C):   43.1 ml 23.58 ml/m  RA Volume:   33.30 ml  18.22 ml/m LA Vol (A4C):   38.1 ml 20.84 ml/m LA Biplane Vol: 40.6 ml 22.21 ml/m  AORTIC VALVE                     PULMONIC VALVE AV Area (Vmax):    1.21 cm      PV Vmax:        1.36 m/s AV Area (Vmean):   1.10 cm      PV Peak grad:   7.4 mmHg AV Area (VTI):     1.21 cm      RVOT Peak grad: 6 mmHg AV Vmax:           293.00 cm/s AV Vmean:          219.000 cm/s AV VTI:            0.528 m AV Peak Grad:      34.3 mmHg AV Mean Grad:      21.5 mmHg LVOT Vmax:  139.00 cm/s LVOT Vmean:        94.500 cm/s LVOT VTI:          0.251 m LVOT/AV VTI ratio: 0.47  AORTA Ao Root diam: 2.90 cm Ao Asc diam:  3.10 cm MITRAL VALVE MV Area (PHT): 5.42 cm     SHUNTS MV Area VTI:   1.91 cm     Systemic VTI:  0.25 m MV Peak grad:  12.2 mmHg    Systemic Diam: 1.80 cm MV Mean grad:  7.0 mmHg MV Vmax:       1.75 m/s MV Vmean:      122.0 cm/s MV Decel Time: 140 msec MV E velocity: 130.00 cm/s MV A velocity: 171.00 cm/s MV E/A ratio:  0.76 Toribio Fuel MD Electronically signed by Toribio Fuel MD Signature Date/Time: 04/12/2024/12:47:50 PM    Final    DG Abd 1 View Result Date: 04/12/2024 CLINICAL DATA:  Evaluate abdominal distension, non gaseous. EXAM: ABDOMEN - 1 VIEW COMPARISON:  None Available. FINDINGS: Within the left lower quadrant of the abdomen there are multiple loops of air-filled small bowel which are mildly increased in caliber measuring up to 2.5 cm. Gas and stool noted within the colon  up to the level of the rectum. Lumbar degenerative disc disease with mild to moderate scoliosis, convex towards the left. Postoperative changes involving the proximal right femur. IMPRESSION: Mildly increased caliber of air-filled small bowel within the left lower quadrant of the abdomen. Findings are nonspecific and may represent ileus or partial small bowel obstruction. Electronically Signed   By: Waddell Calk M.D.   On: 04/12/2024 10:47   CT Head Wo Contrast Result Date: 04/11/2024 CLINICAL DATA:  Chief complaints Weakness CT Head Wo Contrast Head trauma, minor (Age >= 65y) EXAM: CT HEAD WITHOUT CONTRAST TECHNIQUE: Contiguous axial images were obtained from the base of the skull through the vertex without intravenous contrast. RADIATION DOSE REDUCTION: This exam was performed according to the departmental dose-optimization program which includes automated exposure control, adjustment of the mA and/or kV according to patient size and/or use of iterative reconstruction technique. COMPARISON:  CT head 12/30/2020 FINDINGS: Brain: Patchy and confluent areas of decreased attenuation are noted throughout the deep and periventricular white matter of the cerebral hemispheres bilaterally, compatible with chronic microvascular ischemic disease. No evidence of large-territorial acute infarction. No parenchymal hemorrhage. No mass lesion. No extra-axial collection. No mass effect or midline shift. No hydrocephalus. Basilar cisterns are patent. Vascular: No hyperdense vessel. Atherosclerotic calcifications are present within the cavernous internal carotid arteries. Skull: No acute fracture or focal lesion. Sinuses/Orbits: Left maxillary, sphenoid, bilateral ethmoid sinus mucosal thickening. Paranasal sinuses and mastoid air cells are clear. Bilateral lens replacement. Otherwise the orbits are unremarkable. Other: None. IMPRESSION: No acute intracranial abnormality. Electronically Signed   By: Morgane  Naveau M.D.   On:  04/11/2024 19:03   DG Chest 2 View Result Date: 04/11/2024 CLINICAL DATA:  Shortness of breath EXAM: CHEST - 2 VIEW COMPARISON:  Chest x-ray 12/14/2023. CT abdomen and pelvis 01/11/2024. FINDINGS: There are patchy airspace opacities in the right upper lobe. Faint focal mass in the superior segment of the left lower lobe is present, grossly unchanged. There is no pleural effusion or pneumothorax. The cardiomediastinal silhouette is stable. No acute fractures are seen. IMPRESSION: 1. Patchy airspace opacities in the right upper lobe, suspicious for pneumonia. Follow-up imaging recommended in 4-6 weeks to confirm resolution. 2. Unchanged left lower lobe nodule. Electronically Signed   By: Greig Maple HERO.D.  On: 04/11/2024 16:31   (Echo, Carotid, EGD, Colonoscopy, ERCP)    Subjective:  Awake Alert resting in bed No new complaints Discharge Exam: Vitals:   04/16/24 0751 04/16/24 0813  BP: (!) 149/39   Pulse: 75   Resp: 18   Temp: 97.6 F (36.4 C)   SpO2: 95% 96%   Vitals:   04/15/24 2243 04/16/24 0601 04/16/24 0751 04/16/24 0813  BP: (!) 132/51 (!) 127/41 (!) 149/39   Pulse: 76 74 75   Resp: 18 17 18    Temp: 98.6 F (37 C) 97.6 F (36.4 C) 97.6 F (36.4 C)   TempSrc: Oral Oral Oral   SpO2: 93% 98% 95% 96%  Weight:      Height:        General: Pt is alert, awake, not in acute distress Cardiovascular: RRR, S1/S2 +, no rubs, no gallops Respiratory: Rhonchi  abdominal: Soft, NT, ND, bowel sounds + Extremities: no edema, no cyanosis    The results of significant diagnostics from this hospitalization (including imaging, microbiology, ancillary and laboratory) are listed below for reference.     Microbiology: No results found for this or any previous visit (from the past 240 hours).   Labs: BNP (last 3 results) No results for input(s): BNP in the last 8760 hours. Basic Metabolic Panel: Recent Labs  Lab 04/11/24 1540 04/11/24 1811 04/12/24 0537 04/14/24 0925  04/15/24 0640 04/16/24 0629  NA 131* 138 137 138 136 137  K 4.2 4.2 4.6 4.1 4.1 4.5  CL 93*  --  97* 98 97* 100  CO2 25  --  26 30 29 29   GLUCOSE 142*  --  160* 109* 101* 126*  BUN 15  --  20 11 9  6*  CREATININE 1.09*  --  1.07* 0.83 0.82 0.75  CALCIUM  8.8*  --  9.2 8.8* 8.9 9.2  MG  --   --   --  2.0  --   --   PHOS  --   --   --  3.5 2.6 3.1   Liver Function Tests: Recent Labs  Lab 04/12/24 0537 04/15/24 0640 04/16/24 0629  AST 17  --   --   ALT 14  --   --   ALKPHOS 73  --   --   BILITOT 0.5  --   --   PROT 7.2  --   --   ALBUMIN 2.7* 2.6* 2.9*   No results for input(s): LIPASE, AMYLASE in the last 168 hours. Recent Labs  Lab 04/12/24 0755  AMMONIA <13   CBC: Recent Labs  Lab 04/11/24 1540 04/11/24 1811 04/12/24 0537 04/14/24 0925 04/15/24 0640 04/16/24 0629  WBC 10.9*  --  9.9 11.0* 8.8 9.3  HGB 11.8* 12.6 11.7* 11.1* 11.1* 11.8*  HCT 38.0 37.0 38.2 36.1 36.5 38.0  MCV 89.2  --  90.1 91.2 90.8 89.2  PLT 225  --  231 242 235 234   Cardiac Enzymes: Recent Labs  Lab 04/12/24 0755  CKTOTAL 91   BNP: Invalid input(s): POCBNP CBG: Recent Labs  Lab 04/15/24 1745 04/15/24 2246 04/16/24 0751  GLUCAP 134* 182* 143*   D-Dimer No results for input(s): DDIMER in the last 72 hours. Hgb A1c Recent Labs    04/16/24 0629  HGBA1C 7.7*   Lipid Profile No results for input(s): CHOL, HDL, LDLCALC, TRIG, CHOLHDL, LDLDIRECT in the last 72 hours. Thyroid function studies No results for input(s): TSH, T4TOTAL, T3FREE, THYROIDAB in the last 72 hours.  Invalid input(s):  FREET3 Anemia work up No results for input(s): VITAMINB12, FOLATE, FERRITIN, TIBC, IRON, RETICCTPCT in the last 72 hours. Urinalysis    Component Value Date/Time   COLORURINE YELLOW 04/11/2024 1801   APPEARANCEUR CLEAR 04/11/2024 1801   LABSPEC 1.011 04/11/2024 1801   PHURINE 5.0 04/11/2024 1801   GLUCOSEU >=500 (A) 04/11/2024 1801   HGBUR  NEGATIVE 04/11/2024 1801   BILIRUBINUR NEGATIVE 04/11/2024 1801   KETONESUR NEGATIVE 04/11/2024 1801   PROTEINUR NEGATIVE 04/11/2024 1801   NITRITE NEGATIVE 04/11/2024 1801   LEUKOCYTESUR NEGATIVE 04/11/2024 1801   Sepsis Labs Recent Labs  Lab 04/12/24 0537 04/14/24 0925 04/15/24 0640 04/16/24 0629  WBC 9.9 11.0* 8.8 9.3   Microbiology No results found for this or any previous visit (from the past 240 hours).   Time coordinating discharge: 38 minutes  SIGNED:   Almarie KANDICE Hoots, MD  [facility] 04/16/2024, 9:27 AM

## 2024-04-16 NOTE — TOC Progression Note (Addendum)
 Transition of Care (TOC) - Progression Note   Patient for discharge today .   MD working on discharge summary  NCM spoke to patient at bedside. Patient's roommate is working today until 3 pm. [facility] needs to pick up patient's wheelchair and oxygen tank at home to bring to hospital when they come to transport patient home.   Patient stated she has a son who has a key and could assist [facility] with getting her oxygen tank and wheelchair. Patient does not have her son's phone number with her. However, [name] has his number.   Once discharge summary completed NCM will fax to [name] at [facility]. Only new medication is an antibiotic . [facility] MD will need to approve new medication and send prescription to patient's pharmacy. Patient aware of same.   NCM called [name] at [facility] and left a Engineer, technical sales.   [name] returned phone call. She spoke to [name] . [name] will pick patient up and transport home today at 4 pm. [name] gets off work at 3 pm and needs to go home to get patient's DME. Discharge summary faxed to [name] at [facility]   Patient and bedside nurse aware of above  Patient Details  Name: Deanna Petty MRN: [id number] Date of Birth: 10-12-1946  Transition of Care [facility]) CM/SW Contact  [name], [name], RN Phone Number: 04/16/2024, 9:37 AM  Clinical Narrative:            Expected Discharge Plan and Services         Expected Discharge Date: 04/16/24                                     Social Determinants of Health (SDOH) Interventions SDOH Screenings   Food Insecurity: No Food Insecurity (04/12/2024)  Housing: Low Risk  (04/12/2024)  Transportation Needs: No Transportation Needs (04/12/2024)  Utilities: Not At Risk (04/12/2024)  Social Connections: Moderately Integrated (04/14/2024)  Tobacco Use: High Risk (04/11/2024)    Readmission Risk Interventions     No data to display

## 2024-04-25 ENCOUNTER — Telehealth: Payer: Self-pay | Admitting: *Deleted

## 2024-04-25 NOTE — Telephone Encounter (Signed)
 xxxxx

## 2024-04-25 NOTE — Telephone Encounter (Signed)
 CALLED [facility] AND SPOKE WITH LORI , CT ARRANGED @ [facility] [place], ON 05-16-24- ARRIVAL TIME- 2:45 PM, NO RESTRICTIONS TO SCAN, PATIENT TO GO IN URGENT CARE ENTRANCE AND CHECK IN @ DESK 3, SPOKE WITH LORI AND SHE VERIFIED UNDERSTANDING THIS

## 2024-04-26 DIAGNOSIS — I350 Nonrheumatic aortic (valve) stenosis: Secondary | ICD-10-CM | POA: Diagnosis not present

## 2024-04-30 NOTE — Progress Notes (Signed)
  Radiation Oncology         [phone] ________________________________  Name: Deanna Petty MRN: [id number]  Date of Service: 05/06/2024  DOB: 09/28/1946  Post Treatment Telephone Note  Diagnosis:  C34.32 Malignant neoplasm of lower lobe, left bronchus or lung (as documented in provider EOT note)  The patient was not available for call today. Unable to reach patient for today's appointment. 2 calls. No answer. Message was left w/ my extension [phone]. Call complete.   The patient has scheduled follow up with her medical oncologist Dr. Byrum for ongoing care, and was encouraged to call if she develops concerns or questions regarding radiation.   This concludes the interaction.  Rosaline Minerva, LPN

## 2024-05-01 DIAGNOSIS — J189 Pneumonia, unspecified organism: Secondary | ICD-10-CM | POA: Insufficient documentation

## 2024-05-02 ENCOUNTER — Ambulatory Visit

## 2024-05-02 VITALS — BP 110/60 | HR 50 | Ht 62.0 in | Wt 177.0 lb

## 2024-05-02 DIAGNOSIS — I480 Paroxysmal atrial fibrillation: Secondary | ICD-10-CM | POA: Diagnosis not present

## 2024-05-02 DIAGNOSIS — I10 Essential (primary) hypertension: Secondary | ICD-10-CM

## 2024-05-02 DIAGNOSIS — I2510 Atherosclerotic heart disease of native coronary artery without angina pectoris: Secondary | ICD-10-CM | POA: Diagnosis not present

## 2024-05-02 DIAGNOSIS — I350 Nonrheumatic aortic (valve) stenosis: Secondary | ICD-10-CM | POA: Diagnosis not present

## 2024-05-02 DIAGNOSIS — I5032 Chronic diastolic (congestive) heart failure: Secondary | ICD-10-CM

## 2024-05-02 NOTE — Patient Instructions (Signed)
 Medication Instructions:  Your physician recommends that you continue on your current medications as directed. Please refer to the Current Medication list given to you today.  *If you need a refill on your cardiac medications before your next appointment, please call your pharmacy*  Lab Work: None If you have labs (blood work) drawn today and your tests are completely normal, you will receive your results only by: MyChart Message (if you have MyChart) OR A paper copy in the mail If you have any lab test that is abnormal or we need to change your treatment, we will call you to review the results.  Testing/Procedures: Your physician has requested that you have an echocardiogram. Echocardiography is a painless test that uses sound waves to create images of your heart. It provides your doctor with information about the size and shape of your heart and how well your heart's chambers and valves are working. This procedure takes approximately one hour. There are no restrictions for this procedure. Please do NOT wear cologne, perfume, aftershave, or lotions (deodorant is allowed). Please arrive 15 minutes prior to your appointment time.  Please note: We ask at that you not bring children with you during ultrasound (echo/ vascular) testing. Due to room size and safety concerns, children are not allowed in the ultrasound rooms during exams. Our front office staff cannot provide observation of children in our lobby area while testing is being conducted. An adult accompanying a patient to their appointment will only be allowed in the ultrasound room at the discretion of the ultrasound technician under special circumstances. We apologize for any inconvenience.   Follow-Up: At [facility], you and your health needs are our priority.  As part of our continuing mission to provide you with exceptional heart care, our providers are all part of one team.  This team includes your primary Cardiologist  (physician) and Advanced Practice Providers or APPs (Physician Assistants and Nurse Practitioners) who all work together to provide you with the care you need, when you need it.  Your next appointment:   1 month(s)  Provider:   Huntley Dec, MD    We recommend signing up for the patient portal called "MyChart".  Sign up information is provided on this After Visit Summary.  MyChart is used to connect with patients for Virtual Visits (Telemedicine).  Patients are able to view lab/test results, encounter notes, upcoming appointments, etc.  Non-urgent messages can be sent to your provider as well.   To learn more about what you can do with MyChart, go to [email or web address].   Other Instructions None

## 2024-05-02 NOTE — Progress Notes (Signed)
 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   After recent admission and discharge from [facility] between 04/11/2024 through 04/16/2024, here for follow-up visit.  Echocardiograms recently at [facility] hospital 04/12/2024 and repeat echocardiogram 04/25/2024 at [facility].  LVEF appears relatively preserved but mildly reduced with apical wall motion abnormality however poorly visualized on the recent echocardiogram repeated at [facility]. Echocardiogram at [facility] was in acute setting with hyperdynamic LV function and on my review of images appear to show mild apical hypokinesis although poorly visualized.  1.  CHF acute with reduced LVEF and possible apical wall motion abnormality. 2.  Paroxysmal atrial fibrillation 3.  Moderate aortic stenosis 4.  Smoking tobacco. 5.  Moderate nonobstructive coronary artery disease on cardiac cath April 2024 with distal small LCx 70% stenosis 6.  Increased gradients across mitral valve mean 7 mmHg reported during acute inpatient echocardiogram 04/12/2024, not well assessed on repeat echocardiogram 04/25/2024 7.  Hypertension  -Appears compensated and euvolemic. - Proceed with a repeat  echocardiogram limited study with contrast to assess wall motion abnormality, EF and gradients across mitral valve. - If LV dysfunction noted will further optimize guideline directed medical therapy. -Continue anticoagulation with Eliquis  5 mg twice daily.    Problem List Items Addressed This Visit     Chronic diastolic CHF (congestive heart failure) (HCC)   Essential hypertension (Chronic)   Paroxysmal atrial fibrillation [facility]), diagnosed March 2024, CHADS2 Vascor 6, on anticoagulation with Eliquis    CAD (coronary artery disease), last April 2024, distal LCx 70% stenosis small vessel for intervention, on medical therapy   Moderate aortic stenosis, TTE 04/25/2024 - Primary   Relevant Orders   ECHOCARDIOGRAM LIMITED      History of Present Illness:    Deanna Petty is a 78 y.o. female who is being seen today for visit. PCP is [name], N*. Last visit with me in the office was 10/26/2023.  Pleasant woman here for the visit by herself.  Lives at home and she has a friend that helps care for her at this time.  Able to transition in and out of the wheelchair to use the bedside commode.  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  Was recently admitted between 04/11/2024 through 04/16/2024 at [facility] in the setting of feeling weak and confused in the setting of pneumonia.  Echocardiogram at [facility] 04/25/2024 with LVEF 45 to 50%, regional  wall motion abnormalities of apical hypokinesis, RV function normal, moderate aortic stenosis.   Poor endocardial definition for wall motion abnormality assessment.  In comparison prior echocardiogram at [facility] 04/12/2024 reported EF hyperdynamic greater than 75%, moderate aortic stenosis.  Reviewed images myself appear to show mild apical wall motion abnormality hypokinesis on contrast images, suboptimally visualized with tachycardia and hyperdynamic LV function. Also gradients across the mitral valve are elevated mean gradient 7 mmHg.  Here for the visit mentions her breathing is gradually improved. Continues to use oxygen 3 L/min nasal cannula oxygen flow.  Denies any chest pain or palpitations. Denies any significant pedal edema. Good compliance with medications although mentions her caregiver helps manage her medications and she is not completely familiar with the medications she is taking.   Past Medical History:  Diagnosis Date   Acute heart failure with preserved ejection fraction (HFpEF) (HCC) 01/15/2023   Acute on chronic diastolic CHF (congestive heart failure) (HCC) 07/07/2020   Age-related osteoporosis without current pathological fracture 10/03/2023   Angiopathy, diabetic (HCC) 10/03/2023   Aortic stenosis, mild to moderate based on prior echocardiogram from March 2024. 10/26/2023   ARF (acute renal failure) (HCC) 04/11/2024   Bronchitis    CAD (coronary artery disease), last April 2024, distal LCx 70% stenosis small vessel for intervention, on medical therapy 10/26/2023   Chronic diastolic CHF (congestive heart failure) (HCC) 07/07/2020   Chronic respiratory failure with hypoxia (HCC) 05/24/2022   Cigarette smoker    Community acquired pneumonia of right upper lobe of lung 01/15/2022   COPD (chronic obstructive pulmonary disease) (HCC)    Elevated brain natriuretic peptide (BNP) level 07/03/2020   Essential hypertension 01/15/2022   Fatigue 04/11/2024   Hypercoagulable state due to paroxysmal atrial fibrillation (HCC) 01/18/2023   Hypokalemia 01/12/2023    Hypomagnesemia 01/16/2022   Hyponatremia 04/11/2024   Left breast mass 05/14/2013   Lung nodule 08/17/2023   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) 02/19/2024   Mass of left lung 08/17/2023   Myocardial infarction due to demand ischemia (HCC) 01/12/2023   New onset atrial fibrillation (HCC) 01/12/2023   Pain in right hip 10/03/2023   Paroxysmal atrial fibrillation (HCC), diagnosed March 2024, CHADS2 Vascor 6, on anticoagulation with Eliquis  01/12/2023   Peripheral vascular disease (HCC) 10/03/2023   Pneumonia    Polycythemia, secondary 07/07/2020   Screening for malignant neoplasm of colon 10/03/2023   Shortness of breath    with exertion; lasted used inhaler 06/02/13   Tobacco use 07/03/2020   Type 2 diabetes mellitus with hyperlipidemia (HCC) 07/07/2020   Yeah 01/04/2024    Past Surgical History:  Procedure Laterality Date   ABDOMINAL HYSTERECTOMY     BREAST LUMPECTOMY WITH NEEDLE LOCALIZATION Left 06/12/2013   Procedure: LEFT BREAST LUMPECTOMY WITH NEEDLE LOCALIZATION;  Surgeon: Donnice POUR. Belinda, MD;  Location: [facility] OR;  Service: General;  Laterality: Left;   BREAST SURGERY     HIP FRACTURE SURGERY Right    LEFT HEART CATH AND CORONARY ANGIOGRAPHY N/A 01/17/2023   Procedure: LEFT HEART CATH AND CORONARY ANGIOGRAPHY;  Surgeon: Mady Bruckner, MD;  Location: [facility] INVASIVE CV LAB;  Service: Cardiovascular;  Laterality: N/A;    Current Medications: No outpatient medications have been marked as taking for the 05/02/24 encounter (Office Visit) with Sharena Dibenedetto, Alean SAUNDERS, MD.     Allergies:   Patient has no known allergies.   Social History   Socioeconomic History   Marital status: Single    Spouse name: Not on  file   Number of children: Not on file   Years of education: Not on file   Highest education level: Not on file  Occupational History   Not on file  Tobacco Use   Smoking status: Every Day    Current packs/day: 0.25    Average packs/day: 0.3 packs/day for  40.0 years (10.0 ttl pk-yrs)    Types: Cigarettes   Smokeless tobacco: Never   Tobacco comments:    Smokes 3cigs per day 01/25/24  Vaping Use   Vaping status: Never Used  Substance and Sexual Activity   Alcohol use: Yes    Comment: rare   Drug use: No   Sexual activity: Not on file  Other Topics Concern   Not on file  Social History Narrative   Not on file   Social Drivers of Health   Financial Resource Strain: Not on file  Food Insecurity: No Food Insecurity (04/12/2024)   Hunger Vital Sign    Worried About Running Out of Food in the Last Year: Never true    Ran Out of Food in the Last Year: Never true  Transportation Needs: No Transportation Needs (04/12/2024)   PRAPARE - Administrator, Civil Service (Medical): No    Lack of Transportation (Non-Medical): No  Physical Activity: Not on file  Stress: Not on file  Social Connections: Moderately Integrated (04/14/2024)   Social Connection and Isolation Panel    Frequency of Communication with Friends and Family: More than three times a week    Frequency of Social Gatherings with Friends and Family: Never    Attends Religious Services: 1 to 4 times per year    Active Member of [employer] or Organizations: Not on file    Attends Engineer, structural: More than 4 times per year    Marital Status: Never married     Family History: The patient's family history includes Cancer in her mother; Diabetes in her father. ROS:   Please see the history of present illness.    All 14 point review of systems negative except as described per history of present illness.  EKGs/Labs/Other Studies Reviewed:    The following studies were reviewed today:   EKG:       Recent Labs: 04/12/2024: ALT 14 04/14/2024: Magnesium  2.0 04/16/2024: BUN 6; Creatinine, Ser 0.75; Hemoglobin 11.8; Platelets 234; Potassium 4.5; Sodium 137  Recent Lipid Panel    Component Value Date/Time   CHOL 92 01/13/2023 0150   TRIG 46 01/13/2023 0150    HDL 51 01/13/2023 0150   CHOLHDL 1.8 01/13/2023 0150   VLDL 9 01/13/2023 0150   LDLCALC 32 01/13/2023 0150    Physical Exam:    VS:  Ht 5' 2 (1.575 m)   BMI 32.92 kg/m     Wt Readings from Last 3 Encounters:  04/11/24 180 lb (81.6 kg)  03/19/24 182 lb 9.6 oz (82.8 kg)  01/25/24 182 lb 6.4 oz (82.7 kg)     GENERAL:  Well nourished, well developed in no acute distress On nasal flow oxygen. NECK: No JVD; No carotid bruits CARDIAC: RRR, S1 and S2 present, no murmurs, no rubs, no gallops CHEST:  Clear to auscultation without rales, wheezing or rhonchi  Extremities: Trace bilateral pitting pedal edema. Pulses bilaterally symmetric with radial 2+ and dorsalis pedis 2+ NEUROLOGIC:  Alert and oriented x 3  Medication Adjustments/Labs and Tests Ordered: Current medicines are reviewed at length with the patient today.  Concerns regarding medicines are outlined above.  No orders of the defined types were placed in this encounter.  No orders of the defined types were placed in this encounter.   Signed, Alean jess Kobus, MD, MPH, [facility]. 05/02/2024 11:38 AM    [facility]

## 2024-05-06 ENCOUNTER — Ambulatory Visit
Admission: RE | Admit: 2024-05-06 | Discharge: 2024-05-06 | Disposition: A | Discharge status: Home / Self Care | Source: Ambulatory Visit | Attending: Internal Medicine | Admitting: Internal Medicine

## 2024-05-06 DIAGNOSIS — Z510 Encounter for antineoplastic radiation therapy: Secondary | ICD-10-CM | POA: Insufficient documentation

## 2024-05-06 DIAGNOSIS — F17210 Nicotine dependence, cigarettes, uncomplicated: Secondary | ICD-10-CM | POA: Insufficient documentation

## 2024-05-06 DIAGNOSIS — C3432 Malignant neoplasm of lower lobe, left bronchus or lung: Secondary | ICD-10-CM | POA: Insufficient documentation

## 2024-05-06 DIAGNOSIS — R918 Other nonspecific abnormal finding of lung field: Secondary | ICD-10-CM | POA: Insufficient documentation

## 2024-05-07 ENCOUNTER — Telehealth: Payer: Self-pay

## 2024-05-07 NOTE — Telephone Encounter (Signed)
 Darice from [facility] called to schedule Echo. There is an order in the system but I did not see an expected date. It looks like pt has had to two Echo's recently. Also Darice said she is scheduled at [facility] for another Echo by another provider on 07/29. She wants to know if patient needs another Echo with us . Please advise

## 2024-05-08 NOTE — Telephone Encounter (Signed)
 LVM for Darice at [facility] from Dr. Liborio for the reason for the need of the repeat Echo-  I am not sure about the duplicate order visit for July 29 at [facility]. She however needs a repeat limited echocardiogram as noted in my note to assess wall motion, LVEF preferably with Definity  contrast and gradients across the mitral valve because these were poorly visualized and assessed on the last echocardiogram done April 25, 2024. Thank you

## 2024-05-14 ENCOUNTER — Other Ambulatory Visit (HOSPITAL_BASED_OUTPATIENT_CLINIC_OR_DEPARTMENT_OTHER): Admitting: Radiology

## 2024-05-14 ENCOUNTER — Telehealth (HOSPITAL_BASED_OUTPATIENT_CLINIC_OR_DEPARTMENT_OTHER): Payer: Self-pay | Admitting: Adult Health

## 2024-05-14 NOTE — Telephone Encounter (Signed)
 Spoke with Lori from [facility] were the patient receives care/transportation form. The appt ct chest w/ contrast was canceled today 05/14/24 due to the [facility] medicare replacement insurance plan is under our (medicare contrast rule) pt have to be seen on Thursdays with a in house rad to make sure [facility] is following guidelines. Katheryn understood and rescheduled the patient appt to the next opening rad day/time which is 05/30/24. Katheryn is going to see if the patient can get an approval from [facility] to get a sooner ct schedule due to a follow up appt is already scheduled for 05/20/24.

## 2024-05-16 ENCOUNTER — Telehealth: Payer: Self-pay | Admitting: Emergency Medicine

## 2024-05-16 ENCOUNTER — Other Ambulatory Visit (HOSPITAL_BASED_OUTPATIENT_CLINIC_OR_DEPARTMENT_OTHER): Admitting: Radiology

## 2024-05-16 DIAGNOSIS — I517 Cardiomegaly: Secondary | ICD-10-CM | POA: Diagnosis not present

## 2024-05-16 NOTE — Telephone Encounter (Signed)
 Yes please

## 2024-05-16 NOTE — Telephone Encounter (Signed)
 Hey Dr. Shelah, it seems that the CT GUIDED NEEDLE PLACEMENT was deferred to late this month. Would you like the patient to be scheduled for this?

## 2024-05-16 NOTE — Telephone Encounter (Deleted)
 Hey Dr. Shelah, it seems that the CT GUIDED NEEDLE PLACEMENT was deferred to late this month. Would you like the patient to be scheduled?

## 2024-05-23 ENCOUNTER — Ambulatory Visit: Admitting: Emergency Medicine

## 2024-05-24 ENCOUNTER — Encounter (HOSPITAL_COMMUNITY): Payer: Self-pay

## 2024-05-24 NOTE — Progress Notes (Incomplete)
  Radiation Oncology         [phone] ________________________________  Name: Deanna Petty MRN: [id number]  Date of Service: 05/27/2024  DOB: 25-Mar-1946  Post Treatment Telephone Note  Diagnosis:  Malignant neoplasm of lower lobe, left bronchus or lung    The patient was not available for call today.   Symptoms of fatigue {ACTIONS; HAVE/HAVE NOT:19434} improved since completing therapy.  Symptoms of skin changes {ACTIONS; HAVE/HAVE NOT:19434} improved since completing therapy.  Symptoms of esophagitis {ACTIONS; HAVE/HAVE WNU:80565} improved since completing therapy.    She will continue follow up in our clinic for ongoing care, and was encouraged to call if she develops concerns or questions regarding radiation.

## 2024-05-27 ENCOUNTER — Ambulatory Visit
Admission: RE | Admit: 2024-05-27 | Discharge: 2024-05-27 | Disposition: A | Discharge status: Home / Self Care | Source: Ambulatory Visit | Attending: Internal Medicine | Admitting: Internal Medicine

## 2024-05-27 DIAGNOSIS — Z510 Encounter for antineoplastic radiation therapy: Secondary | ICD-10-CM | POA: Insufficient documentation

## 2024-05-27 DIAGNOSIS — F17210 Nicotine dependence, cigarettes, uncomplicated: Secondary | ICD-10-CM | POA: Insufficient documentation

## 2024-05-27 DIAGNOSIS — C3432 Malignant neoplasm of lower lobe, left bronchus or lung: Secondary | ICD-10-CM | POA: Insufficient documentation

## 2024-05-27 DIAGNOSIS — R918 Other nonspecific abnormal finding of lung field: Secondary | ICD-10-CM | POA: Insufficient documentation

## 2024-05-30 ENCOUNTER — Ambulatory Visit (INDEPENDENT_AMBULATORY_CARE_PROVIDER_SITE_OTHER)
Admission: RE | Admit: 2024-05-30 | Discharge: 2024-05-30 | Disposition: A | Discharge status: Home / Self Care | Source: Ambulatory Visit | Attending: Radiation Oncology | Admitting: Radiation Oncology

## 2024-05-30 DIAGNOSIS — C3432 Malignant neoplasm of lower lobe, left bronchus or lung: Secondary | ICD-10-CM | POA: Diagnosis not present

## 2024-05-30 MED ORDER — IOHEXOL 300 MG/ML  SOLN
80.0000 mL | Freq: Once | INTRAMUSCULAR | Status: AC | PRN
  Administered 2024-05-30: 80 mL via INTRAVENOUS

## 2024-05-30 MED ORDER — IOPAMIDOL (ISOVUE-M 300) INJECTION 61%: 80.0000 mL | Freq: Once | INTRAMUSCULAR | Status: DC | PRN

## 2024-06-03 ENCOUNTER — Telehealth: Payer: Self-pay | Admitting: Radiation Oncology

## 2024-06-03 NOTE — Telephone Encounter (Signed)
 LM for pt reviewing her CT scan and recommendations for 6 month CT to follow up on the LLL site that was treated, and that the RUL nodule that was not hypermetabolic on pretreatment PET remains stable and will be followed. I encouraged her to call back if she'd like to discuss further

## 2024-06-06 ENCOUNTER — Encounter: Payer: Self-pay | Admitting: Adult Health

## 2024-06-07 ENCOUNTER — Ambulatory Visit: Payer: Self-pay

## 2024-06-07 NOTE — Progress Notes (Signed)
 Limited transthoracic echocardiogram study done 05/16/2024 at [facility] with use of echo contrast noted normal LVEF 65 to 70%, no wall motion abnormality.  No evidence of mitral stenosis.  This is reassuring.  Please inform her the echocardiogram results at [facility] noted overall normal pumping function of the heart and no significant issue with the mitral valve.

## 2024-06-10 NOTE — Progress Notes (Unsigned)
 [facility]  ASSESSMENT / PLAN: Deanna Petty is a 78 y.o. female with atherosclerosis of native arteries of bilateral lower extremities causing no symptoms.  Recommend:  Abstinence from all tobacco products. Blood glucose control with goal A1c < 7%. Blood pressure control with goal blood pressure < 130/80 mmHg. Lipid reduction therapy with goal LDL-C < 55 mg/dL. Aspirin  81mg  by mouth daily. Atorvastatin  40-80mg  PO QD (or other high intensity statin therapy).  No role for intervention recent Peripheral arterial disease.  No symptoms of chronic limb threatening ischemia.  Patient can follow-up as needed for symptoms concerning for rest pain or ulceration.  She is a poor candidate for intervention.  CHIEF COMPLAINT: Peripheral arterial disease  HISTORY OF PRESENT ILLNESS: Deanna Petty is a 78 y.o. female with CHF, aortic stenosis, COPD on home O2, pAFib, CAD, HTN, DM, HLA, lung cancer referred for evaluation of peripheral arterial disease.  Patient was referred for evaluation of peripheral arterial disease, identified on ankle-brachial index.  The patient is minimally ambulatory.  She does not walk far if asked not to claudicate.  She does not have ischemic rest pain.  She does not have any ulcers about her feet.  She has really no complaints about her lower extremities.  She reports that there is some reddish discoloration about the pretibial skin, which prompted further evaluation.   Past Medical History:  Diagnosis Date   Acute heart failure with preserved ejection fraction (HFpEF) (HCC) 01/15/2023   Acute on chronic diastolic CHF (congestive heart failure) (HCC) 07/07/2020   Age-related osteoporosis without current pathological fracture 10/03/2023   Angiopathy, diabetic (HCC) 10/03/2023   Aortic stenosis, mild to moderate based on prior echocardiogram from March 2024. 10/26/2023   ARF (acute renal failure) (HCC) 04/11/2024   Bronchitis    CAD  (coronary artery disease), last April 2024, distal LCx 70% stenosis small vessel for intervention, on medical therapy 10/26/2023   Chronic diastolic CHF (congestive heart failure) (HCC) 07/07/2020   Chronic respiratory failure with hypoxia (HCC) 05/24/2022   Cigarette smoker    Community acquired pneumonia of right upper lobe of lung 01/15/2022   COPD (chronic obstructive pulmonary disease) (HCC)    Elevated brain natriuretic peptide (BNP) level 07/03/2020   Essential hypertension 01/15/2022   Fatigue 04/11/2024   Hypercoagulable state due to paroxysmal atrial fibrillation (HCC) 01/18/2023   Hypokalemia 01/12/2023   Hypomagnesemia 01/16/2022   Hyponatremia 04/11/2024   Left breast mass 05/14/2013   Lung nodule 08/17/2023   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) 02/19/2024   Mass of left lung 08/17/2023   Myocardial infarction due to demand ischemia (HCC) 01/12/2023   New onset atrial fibrillation (HCC) 01/12/2023   Pain in right hip 10/03/2023   Paroxysmal atrial fibrillation (HCC), diagnosed March 2024, CHADS2 Vascor 6, on anticoagulation with Eliquis  01/12/2023   Peripheral vascular disease (HCC) 10/03/2023   Pneumonia    Polycythemia, secondary 07/07/2020   Screening for malignant neoplasm of colon 10/03/2023   Shortness of breath    with exertion; lasted used inhaler 06/02/13   Tobacco use 07/03/2020   Type 2 diabetes mellitus with hyperlipidemia (HCC) 07/07/2020   Yeah 01/04/2024    Past Surgical History:  Procedure Laterality Date   ABDOMINAL HYSTERECTOMY     BREAST LUMPECTOMY WITH NEEDLE LOCALIZATION Left 06/12/2013   Procedure: LEFT BREAST LUMPECTOMY WITH NEEDLE LOCALIZATION;  Surgeon: Donnice POUR. Belinda, MD;  Location: [facility] OR;  Service: General;  Laterality: Left;  BREAST SURGERY     HIP FRACTURE SURGERY Right    LEFT HEART CATH AND CORONARY ANGIOGRAPHY N/A 01/17/2023   Procedure: LEFT HEART CATH AND CORONARY ANGIOGRAPHY;  Surgeon: Mady Bruckner, MD;   Location: [facility];  Service: Cardiovascular;  Laterality: N/A;    Family History  Problem Relation Age of Onset   Cancer Mother        oral, brain   Diabetes Father     Social History   Socioeconomic History   Marital status: Single    Spouse name: Not on file   Number of children: Not on file   Years of education: Not on file   Highest education level: Not on file  Occupational History   Not on file  Tobacco Use   Smoking status: Every Day    Current packs/day: 0.25    Average packs/day: 0.3 packs/day for 40.0 years (10.0 ttl pk-yrs)    Types: Cigarettes   Smokeless tobacco: Never   Tobacco comments:    Smokes 3cigs per day 01/25/24  Vaping Use   Vaping status: Never Used  Substance and Sexual Activity   Alcohol use: Yes    Comment: rare   Drug use: No   Sexual activity: Not on file  Other Topics Concern   Not on file  Social History Narrative   Not on file   Social Drivers of Health   Financial Resource Strain: Not on file  Food Insecurity: No Food Insecurity (04/12/2024)   Hunger Vital Sign    Worried About Running Out of Food in the Last Year: Never true    Ran Out of Food in the Last Year: Never true  Transportation Needs: No Transportation Needs (04/12/2024)   PRAPARE - Administrator, Civil Service (Medical): No    Lack of Transportation (Non-Medical): No  Physical Activity: Not on file  Stress: Not on file  Social Connections: Moderately Integrated (04/14/2024)   Social Connection and Isolation Panel    Frequency of Communication with Friends and Family: More than three times a week    Frequency of Social Gatherings with Friends and Family: Never    Attends Religious Services: 1 to 4 times per year    Active Member of [employer] or Organizations: Not on file    Attends Banker Meetings: More than 4 times per year    Marital Status: Never married  Intimate Partner Violence: Not At Risk (04/12/2024)   Humiliation, Afraid,  Rape, and Kick questionnaire    Fear of Current or Ex-Partner: No    Emotionally Abused: No    Physically Abused: No    Sexually Abused: No    No Known Allergies  Current Outpatient Medications  Medication Sig Dispense Refill   acetaminophen  (TYLENOL ) 500 MG tablet Take 500 mg by mouth every 6 (six) hours as needed for mild pain (pain score 1-3) or moderate pain (pain score 4-6).     albuterol  (VENTOLIN  HFA) 108 (90 Base) MCG/ACT inhaler Inhale 1 puff into the lungs every 6 (six) hours as needed for wheezing or shortness of breath.     ALPRAZolam (XANAX) 0.5 MG tablet Take 0.5 mg by mouth at bedtime as needed for anxiety.     amLODipine  (NORVASC ) 5 MG tablet Take 1 tablet (5 mg total) by mouth daily. 30 tablet 2   apixaban  (ELIQUIS ) 5 MG TABS tablet Take 1 tablet (5 mg total) by mouth 2 (two) times daily. 60 tablet 2   ascorbic  acid (VITAMIN C) 500 MG tablet Take 500 mg by mouth daily.     aspirin  EC 81 MG tablet Take 1 tablet (81 mg total) by mouth daily. Swallow whole. 30 tablet 2   bisoprolol  (ZEBETA ) 5 MG tablet Take 1 tablet (5 mg total) by mouth daily. 30 tablet 2   buPROPion  (WELLBUTRIN  XL) 150 MG 24 hr tablet Take 150 mg by mouth daily.     Calcium  Carb-Cholecalciferol (CALCIUM  + VITAMIN D3 PO) Take 1 tablet by mouth 2 (two) times daily.     cetirizine (ZYRTEC) 10 MG chewable tablet Chew 10 mg by mouth daily.     cyclobenzaprine (FLEXERIL) 10 MG tablet Take 10 mg by mouth 3 (three) times daily as needed for muscle spasms.     dapagliflozin  propanediol (FARXIGA ) 10 MG TABS tablet Take 1 tablet (10 mg total) by mouth daily. 30 tablet 2   diclofenac  Sodium (VOLTAREN ) 1 % GEL Apply 4 g topically 4 (four) times daily. 4 g 2   fluticasone  (FLONASE ) 50 MCG/ACT nasal spray Place 1-2 sprays into both nostrils daily as needed for allergies.     Fluticasone -Umeclidin-Vilant (TRELEGY ELLIPTA ) 100-62.5-25 MCG/ACT AEPB Inhale 1 puff into the lungs daily. 28 each 5   isosorbide  mononitrate  (IMDUR ) 30 MG 24 hr tablet Take 30 mg by mouth daily.     levalbuterol  (XOPENEX ) 0.63 MG/3ML nebulizer solution Take 3 mLs (0.63 mg total) by nebulization every 4 (four) hours as needed for wheezing or shortness of breath. 3 mL 12   metFORMIN  (GLUCOPHAGE ) 1000 MG tablet Take 1,000 mg by mouth daily.     rosuvastatin  (CRESTOR ) 5 MG tablet Take 5 mg by mouth daily.     No current facility-administered medications for this visit.    PHYSICAL EXAM Vitals:   06/11/24 1049  BP: 126/73  Pulse: 61  Temp: 97.8 F (36.6 C)  SpO2: 92%    Elderly woman in no acute distress Regular rhythm Unlabored breathing No palpable pedal pulses Mild edema and dependent rubor of the pretibial skin and feet bilaterally  PERTINENT LABORATORY AND RADIOLOGIC DATA  Most recent CBC    Latest Ref Rng & Units 04/16/2024    6:29 AM 04/15/2024    6:40 AM 04/14/2024    9:25 AM  CBC  WBC 4.0 - 10.5 K/uL 9.3  8.8  11.0   Hemoglobin 12.0 - 15.0 g/dL 88.1  88.8  88.8   Hematocrit 36.0 - 46.0 % 38.0  36.5  36.1   Platelets 150 - 400 K/uL 234  235  242      Most recent CMP    Latest Ref Rng & Units 04/16/2024    6:29 AM 04/15/2024    6:40 AM 04/14/2024    9:25 AM  CMP  Glucose 70 - 99 mg/dL 873  898  890   BUN 8 - 23 mg/dL 6  9  11    Creatinine 0.44 - 1.00 mg/dL 9.24  9.17  9.16   Sodium 135 - 145 mmol/L 137  136  138   Potassium 3.5 - 5.1 mmol/L 4.5  4.1  4.1   Chloride 98 - 111 mmol/L 100  97  98   CO2 22 - 32 mmol/L 29  29  30    Calcium  8.9 - 10.3 mg/dL 9.2  8.9  8.8     Renal function CrCl cannot be calculated (Patient's most recent lab result is older than the maximum 21 days allowed.).  Hgb A1c MFr Bld (%)  Date  Value  04/16/2024 7.7 (H)    LDL Cholesterol  Date Value Ref Range Status  01/13/2023 32 0 - 99 mg/dL Final    Comment:           Total Cholesterol/HDL:CHD Risk Coronary Heart Disease Risk Table                     Men   Women  1/2 Average Risk   3.4   3.3  Average Risk       5.0    4.4  2 X Average Risk   9.6   7.1  3 X Average Risk  23.4   11.0        Use the calculated Patient Ratio above and the CHD Risk Table to determine the patient's CHD Risk.        ATP III CLASSIFICATION (LDL):  <100     mg/dL   Optimal  899-870  mg/dL   Near or Above                    Optimal  130-159  mg/dL   Borderline  839-810  mg/dL   High  >809     mg/dL   Very High Performed at [facility], [address], [place], [place] [address]     Outside ABI  R 0.34 L 0.68  Debby SAILOR. Magda, MD [facility] Phone Number: [phone] 06/10/2024 6:19 PM   Total time spent on preparing this encounter including chart review, data review, collecting history, examining the patient, and coordinating care: 45 minutes  Portions of this report may have been transcribed using voice recognition software.  Every effort has been made to ensure accuracy; however, inadvertent computerized transcription errors may still be present.

## 2024-06-11 ENCOUNTER — Ambulatory Visit: Attending: Vascular Surgery | Admitting: Vascular Surgery

## 2024-06-11 ENCOUNTER — Encounter: Payer: Self-pay | Admitting: Vascular Surgery

## 2024-06-11 VITALS — BP 126/73 | HR 61 | Temp 97.8°F

## 2024-06-11 DIAGNOSIS — I739 Peripheral vascular disease, unspecified: Secondary | ICD-10-CM | POA: Diagnosis not present

## 2024-06-12 ENCOUNTER — Other Ambulatory Visit: Payer: Self-pay

## 2024-06-12 DIAGNOSIS — I739 Peripheral vascular disease, unspecified: Secondary | ICD-10-CM

## 2024-06-12 NOTE — Telephone Encounter (Signed)
 Error/disregard

## 2024-06-14 ENCOUNTER — Ambulatory Visit

## 2024-06-14 VITALS — BP 132/60 | HR 70 | Ht 62.0 in | Wt 174.0 lb

## 2024-06-14 DIAGNOSIS — I480 Paroxysmal atrial fibrillation: Secondary | ICD-10-CM

## 2024-06-14 DIAGNOSIS — I2510 Atherosclerotic heart disease of native coronary artery without angina pectoris: Secondary | ICD-10-CM | POA: Diagnosis not present

## 2024-06-14 DIAGNOSIS — I350 Nonrheumatic aortic (valve) stenosis: Secondary | ICD-10-CM | POA: Diagnosis not present

## 2024-06-14 DIAGNOSIS — I5032 Chronic diastolic (congestive) heart failure: Secondary | ICD-10-CM | POA: Diagnosis not present

## 2024-06-14 DIAGNOSIS — I10 Essential (primary) hypertension: Secondary | ICD-10-CM | POA: Diagnosis not present

## 2024-06-14 MED ORDER — TORSEMIDE 20 MG PO TABS: 20.0000 mg | ORAL_TABLET | Freq: Every day | ORAL | 3 refills | Status: AC | PRN

## 2024-06-14 NOTE — Patient Instructions (Signed)
 Medication Instructions:  Your physician has recommended you make the following change in your medication:   Take Torsemide  20 mg daily as needed for weight gain of 2-3 pounds in a day or 5 pounds in a week.  *If you need a refill on your cardiac medications before your next appointment, please call your pharmacy*   Lab Work: None ordered If you have labs (blood work) drawn today and your tests are completely normal, you will receive your results only by: MyChart Message (if you have MyChart) OR A paper copy in the mail If you have any lab test that is abnormal or we need to change your treatment, we will call you to review the results.   Testing/Procedures: None ordered   Follow-Up: At [facility], you and your health needs are our priority.  As part of our continuing mission to provide you with exceptional heart care, we have created designated Provider Care Teams.  These Care Teams include your primary Cardiologist (physician) and Advanced Practice Providers (APPs -  Physician Assistants and Nurse Practitioners) who all work together to provide you with the care you need, when you need it.  We recommend signing up for the patient portal called MyChart.  Sign up information is provided on this After Visit Summary.  MyChart is used to connect with patients for Virtual Visits (Telemedicine).  Patients are able to view lab/test results, encounter notes, upcoming appointments, etc.  Non-urgent messages can be sent to your provider as well.   To learn more about what you can do with MyChart, go to [email or web address].    Your next appointment:   2 month(s)  The format for your next appointment:   In Person  Provider:   Alean Kobus, MD    Other Instructions none  Important Information About Sugar

## 2024-06-14 NOTE — Progress Notes (Signed)
 Cardiology Consultation:    Date:  06/14/2024   ID:  Deanna Petty, Deanna Petty 01/03/1946, MRN [id number]  PCP:  Deanna Levorn Getting, NP-C  Cardiologist:  Deanna JONELLE Kobus, MD   Referring MD: Deanna Petty LOISE*   No chief complaint on file.    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 Repeat echocardiogram 05/16/2024 at [facility] with contrast noted LVEF 65 to 70% no wall motion abnormality and no significant mitral valve disease which is reassuring.   Here for follow-up visit.  1.   Chronic diastolic CHF, LVEF 65 to 70% on echocardiogram 05/16/2024 at [facility].  Appears euvolemic and compensated 2.  Paroxysmal atrial fibrillation 3.  Moderate aortic stenosis 4.  Smoking tobacco. 5.  Moderate nonobstructive coronary artery disease on cardiac cath April 2024 with distal small LCx 70% stenosis 6.  Hypertension, well-controlled   - Continue anticoagulation with Eliquis  5 mg twice daily. - Continue amlodipine  5 mg once daily - Continue bisoprolol  5 mg once daily Continue Farxiga  10 mg once daily. Continue Imdur  30 mg once daily. Continue Crestor  5 mg once daily. - Advised about low-salt diet keep restricted electrogram per day. Advised to monitor daily weights and her weight fluctuates up by 2 to 3 pounds, to take on loop diuretic. Prescribing torsemide  20 mg to use as needed for weight gain as reviewed above.  Return to clinic for follow-up in 2 months  Problem List Items Addressed This Visit     Chronic diastolic CHF  (congestive heart failure) (HCC) - Primary   Relevant Medications   torsemide  (DEMADEX ) 20 MG tablet   Essential hypertension (Chronic)   Relevant Medications   torsemide  (DEMADEX ) 20 MG tablet   Paroxysmal atrial fibrillation [facility] diagnosed March 2024, CHADS2 Vascor 6, on anticoagulation with Eliquis    Relevant Medications   torsemide  (DEMADEX ) 20 MG tablet   CAD (coronary artery disease), last April 2024, distal LCx 70% stenosis small vessel for intervention, on medical therapy   Relevant Medications   torsemide  (DEMADEX ) 20 MG tablet   Moderate aortic stenosis, TTE 04/25/2024   Relevant Medications   torsemide  (DEMADEX ) 20 MG tablet      History of Present Illness:    Deanna Petty is a 78 y.o. female who is being seen today for follow-up visit. PCP is Deanna Petty, N*. Last visit with me in the office was 05/02/2024.  Here for the visit today accompanied by her friend.  She lives with her at home and helps care for her.  Her mobility is limited to transitioning in and out of her wheelchair to use a bedside commode.  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 Repeat echocardiogram 05/16/2024 at [facility] with contrast noted LVEF 65 to 70% no wall motion abnormality  and no significant mitral valve disease which is reassuring.  Overall no significant change from her baseline. Denies any chest pain. Currently not checking daily weights but able to, her friend mentioned she had to be able to arrange for this.  Not diligent about salt restriction in her  diet.  Last blood work from 04/16/2024 BUN 6, creatinine 0.75, EGFR greater than 60.   Past Medical History:  Diagnosis Date   Acute heart failure with preserved ejection fraction (HFpEF) (HCC) 01/15/2023   Acute on chronic diastolic CHF (congestive heart failure) (HCC) 07/07/2020   Age-related osteoporosis without current pathological fracture 10/03/2023   Angiopathy, diabetic (HCC) 10/03/2023   Aortic stenosis, mild to moderate based on prior echocardiogram from March 2024. 10/26/2023   ARF (acute renal failure) (HCC) 04/11/2024   Bronchitis    CAD (coronary artery disease), last April 2024, distal LCx 70% stenosis small vessel for intervention, on medical therapy 10/26/2023   Chronic diastolic CHF (congestive heart failure) (HCC) 07/07/2020   Chronic respiratory failure with hypoxia (HCC) 05/24/2022   Cigarette smoker    Community acquired pneumonia of right upper lobe of lung 01/15/2022   COPD (chronic obstructive pulmonary disease) (HCC)    Elevated brain natriuretic peptide (BNP) level 07/03/2020   Essential hypertension 01/15/2022   Fatigue 04/11/2024   Hypercoagulable state due to paroxysmal atrial fibrillation (HCC) 01/18/2023   Hypokalemia 01/12/2023   Hypomagnesemia 01/16/2022   Hyponatremia 04/11/2024   Left breast mass 05/14/2013   Lung nodule 08/17/2023   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) 02/19/2024   Mass of left lung 08/17/2023   Moderate aortic stenosis, TTE 04/25/2024 10/26/2023   Myocardial infarction due to demand ischemia (HCC) 01/12/2023   New onset atrial fibrillation (HCC) 01/12/2023   Pain in right hip 10/03/2023   Paroxysmal atrial fibrillation (HCC), diagnosed March 2024, CHADS2 Vascor 6, on anticoagulation with Eliquis  01/12/2023   Peripheral vascular disease (HCC) 10/03/2023   Pneumonia    Polycythemia, secondary 07/07/2020   Screening for malignant neoplasm of colon 10/03/2023   Shortness of breath    with exertion; lasted used  inhaler 06/02/13   Tobacco use 07/03/2020   Type 2 diabetes mellitus with hyperlipidemia (HCC) 07/07/2020   Yeah 01/04/2024    Past Surgical History:  Procedure Laterality Date   ABDOMINAL HYSTERECTOMY     BREAST LUMPECTOMY WITH NEEDLE LOCALIZATION Left 06/12/2013   Procedure: LEFT BREAST LUMPECTOMY WITH NEEDLE LOCALIZATION;  Surgeon: Donnice POUR. Belinda, MD;  Location: [facility] OR;  Service: General;  Laterality: Left;   BREAST SURGERY     HIP FRACTURE SURGERY Right    LEFT HEART CATH AND CORONARY ANGIOGRAPHY N/A 01/17/2023   Procedure: LEFT HEART CATH AND CORONARY ANGIOGRAPHY;  Surgeon: Mady Bruckner, MD;  Location: [facility] INVASIVE CV LAB;  Service: Cardiovascular;  Laterality: N/A;    Current Medications: Current Meds  Medication Sig   acetaminophen  (TYLENOL ) 500 MG tablet Take 500 mg by mouth every 6 (six) hours as needed for mild pain (pain score 1-3) or moderate pain (pain score 4-6).   albuterol  (VENTOLIN  HFA) 108 (90 Base) MCG/ACT inhaler Inhale 1 puff into the lungs every 6 (six) hours as needed for wheezing or shortness of breath.   ALPRAZolam (XANAX) 0.5 MG tablet Take 0.5 mg by mouth at bedtime as needed for anxiety.   amLODipine  (NORVASC ) 5 MG tablet Take 1 tablet (5 mg total) by mouth daily.   apixaban  (ELIQUIS ) 5 MG TABS tablet Take 1 tablet (5 mg total)  by mouth 2 (two) times daily.   ascorbic acid (VITAMIN C) 500 MG tablet Take 500 mg by mouth daily.   aspirin  EC 81 MG tablet Take 1 tablet (81 mg total) by mouth daily. Swallow whole.   bisoprolol  (ZEBETA ) 5 MG tablet Take 1 tablet (5 mg total) by mouth daily.   buPROPion  (WELLBUTRIN  XL) 150 MG 24 hr tablet Take 150 mg by mouth daily.   Calcium  Carb-Cholecalciferol (CALCIUM  + VITAMIN D3 PO) Take 1 tablet by mouth 2 (two) times daily.   cetirizine (ZYRTEC) 10 MG chewable tablet Chew 10 mg by mouth daily.   cyclobenzaprine (FLEXERIL) 10 MG tablet Take 10 mg by mouth 3 (three) times daily as needed for muscle spasms.   dapagliflozin   propanediol (FARXIGA ) 10 MG TABS tablet Take 1 tablet (10 mg total) by mouth daily.   diclofenac  Sodium (VOLTAREN ) 1 % GEL Apply 4 g topically 4 (four) times daily.   fluticasone  (FLONASE ) 50 MCG/ACT nasal spray Place 1-2 sprays into both nostrils daily as needed for allergies.   Fluticasone -Umeclidin-Vilant (TRELEGY ELLIPTA ) 100-62.5-25 MCG/ACT AEPB Inhale 1 puff into the lungs daily.   isosorbide  mononitrate (IMDUR ) 30 MG 24 hr tablet Take 30 mg by mouth daily.   levalbuterol  (XOPENEX ) 0.63 MG/3ML nebulizer solution Take 3 mLs (0.63 mg total) by nebulization every 4 (four) hours as needed for wheezing or shortness of breath.   metFORMIN  (GLUCOPHAGE ) 1000 MG tablet Take 1,000 mg by mouth daily.   rosuvastatin  (CRESTOR ) 5 MG tablet Take 5 mg by mouth daily.   torsemide  (DEMADEX ) 20 MG tablet Take 1 tablet (20 mg total) by mouth daily as needed (weight gain of 2-3 pounds in a day or 5 pounds in a week).     Allergies:   Patient has no known allergies.   Social History   Socioeconomic History   Marital status: Single    Spouse name: Not on file   Number of children: Not on file   Years of education: Not on file   Highest education level: Not on file  Occupational History   Not on file  Tobacco Use   Smoking status: Every Day    Current packs/day: 0.25    Average packs/day: 0.3 packs/day for 40.0 years (10.0 ttl pk-yrs)    Types: Cigarettes   Smokeless tobacco: Never   Tobacco comments:    Smokes 3cigs per day 01/25/24  Vaping Use   Vaping status: Never Used  Substance and Sexual Activity   Alcohol use: Yes    Comment: rare   Drug use: No   Sexual activity: Not on file  Other Topics Concern   Not on file  Social History Narrative   Not on file   Social Drivers of Health   Financial Resource Strain: Not on file  Food Insecurity: No Food Insecurity (04/12/2024)   Hunger Vital Sign    Worried About Running Out of Food in the Last Year: Never true    Ran Out of Food in the  Last Year: Never true  Transportation Needs: No Transportation Needs (04/12/2024)   PRAPARE - Administrator, Civil Service (Medical): No    Lack of Transportation (Non-Medical): No  Physical Activity: Not on file  Stress: Not on file  Social Connections: Moderately Integrated (04/14/2024)   Social Connection and Isolation Panel    Frequency of Communication with Friends and Family: More than three times a week    Frequency of Social Gatherings with Friends and Family: Never  Attends Religious Services: 1 to 4 times per year    Active Member of Clubs or Organizations: Not on file    Attends Banker Meetings: More than 4 times per year    Marital Status: Never married     Family History: The patient's family history includes Cancer in her mother; Diabetes in her father. ROS:   Please see the history of present illness.    All 14 point review of systems negative except as described per history of present illness.  EKGs/Labs/Other Studies Reviewed:    The following studies were reviewed today:   EKG:       Recent Labs: 04/12/2024: ALT 14 04/14/2024: Magnesium  2.0 04/16/2024: BUN 6; Creatinine, Ser 0.75; Hemoglobin 11.8; Platelets 234; Potassium 4.5; Sodium 137  Recent Lipid Panel    Component Value Date/Time   CHOL 92 01/13/2023 0150   TRIG 46 01/13/2023 0150   HDL 51 01/13/2023 0150   CHOLHDL 1.8 01/13/2023 0150   VLDL 9 01/13/2023 0150   LDLCALC 32 01/13/2023 0150    Physical Exam:    VS:  BP 132/60   Pulse 70   Ht 5' 2 (1.575 m)   Wt 174 lb (78.9 kg)   SpO2 91%   BMI 31.83 kg/m     Wt Readings from Last 3 Encounters:  06/14/24 174 lb (78.9 kg)  05/02/24 177 lb (80.3 kg)  04/11/24 180 lb (81.6 kg)     GENERAL:  Well nourished, well developed in no acute distress NECK: No JVD; No carotid bruits CARDIAC: RRR, S1 and S2 present, 3/6 ejection systolic murmur best heard right upper sternal border.   CHEST: Bilateral reduced air entry, no  active wheezing or crackles. Extremities: Trace bilateral pitting pedal edema. Pulses bilaterally symmetric with radial 2+ and dorsalis pedis 2+ NEUROLOGIC:  Alert and oriented x 3  Medication Adjustments/Labs and Tests Ordered: Current medicines are reviewed at length with the patient today.  Concerns regarding medicines are outlined above.  No orders of the defined types were placed in this encounter.  Meds ordered this encounter  Medications   torsemide  (DEMADEX ) 20 MG tablet    Sig: Take 1 tablet (20 mg total) by mouth daily as needed (weight gain of 2-3 pounds in a day or 5 pounds in a week).    Dispense:  30 tablet    Refill:  3    Signed, Marcine Gadway reddy Fielding Mault, MD, MPH, [facility]. 06/14/2024 5:02 PM    [facility]

## 2024-07-04 ENCOUNTER — Encounter: Payer: Self-pay | Admitting: Emergency Medicine

## 2024-07-04 ENCOUNTER — Ambulatory Visit (INDEPENDENT_AMBULATORY_CARE_PROVIDER_SITE_OTHER): Admitting: Emergency Medicine

## 2024-07-04 VITALS — BP 124/58 | HR 65 | Ht 62.0 in | Wt 171.0 lb

## 2024-07-04 DIAGNOSIS — J449 Chronic obstructive pulmonary disease, unspecified: Secondary | ICD-10-CM | POA: Diagnosis not present

## 2024-07-04 DIAGNOSIS — Z23 Encounter for immunization: Secondary | ICD-10-CM

## 2024-07-04 DIAGNOSIS — J9611 Chronic respiratory failure with hypoxia: Secondary | ICD-10-CM | POA: Diagnosis not present

## 2024-07-04 DIAGNOSIS — Z720 Tobacco use: Secondary | ICD-10-CM

## 2024-07-04 DIAGNOSIS — C3432 Malignant neoplasm of lower lobe, left bronchus or lung: Secondary | ICD-10-CM | POA: Diagnosis not present

## 2024-07-04 DIAGNOSIS — F17210 Nicotine dependence, cigarettes, uncomplicated: Secondary | ICD-10-CM

## 2024-07-04 NOTE — Assessment & Plan Note (Signed)
 Currently titrated to 3 L/min at all times.  Plan to continue

## 2024-07-04 NOTE — Assessment & Plan Note (Signed)
 Treated with SBRT in June.  Most recent CT chest shows that her treated nodule is decreased in size, other nodules stable.  Plan to repeat her CT chest for surveillance in February 2026 then follow-up to review.  She will also follow-up with radiation oncology.

## 2024-07-04 NOTE — Assessment & Plan Note (Signed)
.  Continue your Trelegy 1 inhalation once daily.  Rinse and gargle after using. Keep your albuterol  available to use 2 puffs when needed for shortness of breath, chest tightness, wheezing. We will give you the flu shot and the RSV shot today. Consider getting the COVID-19 vaccine this fall

## 2024-07-04 NOTE — Progress Notes (Signed)
   Subjective:    Patient ID: Deanna Petty, female    DOB: 01-30-1946, 78 y.o.   MRN: [id number]  HPI  ROV 07/04/2024 --78 year old active smoker with COPD, OSA on CPAP, chronic hypoxemic respiratory failure on 3-5 L/min depending on level exertion.  Also with hypertension and A-fib with chronic diastolic dysfunction.  She underwent empiric SBRT to a hypermetabolic left lower lobe hypermetabolic nodule in 03/2024. Currently managed on Trelegy, uses albuterol  approximately 2-3x a week.  She reports that she fell 2 weeks ago while walking through the kitchen. She had her O2 on, did not trip or lose consciousness. She was treated for an acute flare and hospitalized, treated for PNA. She has minimal cough or mucous currently. Does ambulate some but also uses wheelchair. She is smoking about 3-4 cigarettes a day.   CT chest 05/30/2024 reviewed by me shows decrease in size of her spiculated left lower lobe pulmonary nodule now 2.6 x 2.1 cm.  Her subsolid nodule in the superior segment of the right lower lobe is unchanged.  She has background bronchiolitis from her tobacco use.   Review of Systems As per HPI        Objective:   Physical Exam Vitals:   07/04/24 1127  BP: (!) 124/58  Pulse: 65  SpO2: 98%  Weight: 171 lb (77.6 kg)  Height: 5' 2 (1.575 m)    Gen: Obese woman in a wheelchair, in no distress,  normal affect  ENT: No lesions,  mouth clear,  oropharynx clear, no postnasal drip  Neck: No JVD, no stridor  Lungs: No use of accessory muscles, very distant, decreased at both bases without wheezes or crackles  Cardiovascular: RRR, heart sounds normal, no murmur or gallops, trace peripheral edema  Musculoskeletal: No deformities, no cyanosis or clubbing  Neuro: alert, awake, non focal  Skin: Warm, no lesions or rash       Assessment & Plan:  COPD (chronic obstructive pulmonary disease) (HCC) .Continue your Trelegy 1 inhalation once daily.  Rinse and gargle after  using. Keep your albuterol  available to use 2 puffs when needed for shortness of breath, chest tightness, wheezing. We will give you the flu shot and the RSV shot today. Consider getting the COVID-19 vaccine this fall   Chronic respiratory failure with hypoxia (HCC) Currently titrated to 3 L/min at all times.  Plan to continue  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) Treated with SBRT in June.  Most recent CT chest shows that her treated nodule is decreased in size, other nodules stable.  Plan to repeat her CT chest for surveillance in February 2026 then follow-up to review.  She will also follow-up with radiation oncology.  Tobacco use Discussed cessation with her today.  She still smoking 3 to 4 cigarettes daily.  She does not believe she is in a position to set a quit date at this time.  Will continue to discuss, continue to work on decreasing.    Lamar Chris, MD, PhD 07/04/2024, 12:10 PM [facility] [phone] or if no answer before 7:00PM call [phone] For any issues after 7:00PM please call eLink [phone]

## 2024-07-04 NOTE — Patient Instructions (Addendum)
 We reviewed your CT scan of the chest today.  We will plan to repeat your CT in February 2026. Continue your Trelegy 1 inhalation once daily.  Rinse and gargle after using. Keep your albuterol  available to use 2 puffs when needed for shortness of breath, chest tightness, wheezing. Continue your oxygen at 3 L/min We will give you the flu shot and the RSV shot today. Consider getting the COVID-19 vaccine this fall You need to work on decreasing your cigarettes.  Ultimate goal will be to stop altogether. Follow with Dr. Shelah in February after your CT so we can review those films together.  Please call sooner if you have any problems.

## 2024-07-04 NOTE — Assessment & Plan Note (Signed)
 Discussed cessation with her today.  She still smoking 3 to 4 cigarettes daily.  She does not believe she is in a position to set a quit date at this time.  Will continue to discuss, continue to work on decreasing.

## 2024-08-14 ENCOUNTER — Ambulatory Visit

## 2024-08-27 ENCOUNTER — Other Ambulatory Visit: Payer: Self-pay

## 2024-08-29 ENCOUNTER — Ambulatory Visit

## 2024-08-29 VITALS — BP 127/50 | HR 64 | Ht 65.0 in | Wt 163.6 lb

## 2024-08-29 DIAGNOSIS — I350 Nonrheumatic aortic (valve) stenosis: Secondary | ICD-10-CM | POA: Diagnosis not present

## 2024-08-29 DIAGNOSIS — I2510 Atherosclerotic heart disease of native coronary artery without angina pectoris: Secondary | ICD-10-CM | POA: Diagnosis not present

## 2024-08-29 DIAGNOSIS — I480 Paroxysmal atrial fibrillation: Secondary | ICD-10-CM | POA: Diagnosis not present

## 2024-08-29 DIAGNOSIS — I5032 Chronic diastolic (congestive) heart failure: Secondary | ICD-10-CM

## 2024-08-29 DIAGNOSIS — I10 Essential (primary) hypertension: Secondary | ICD-10-CM

## 2024-08-29 NOTE — Progress Notes (Signed)
 Cardiology Consultation:    Date:  08/29/2024   ID:  Deanna Petty, Deanna Petty [birth year], MRN [id number]  PCP:  Deanna Levorn Getting, NP-C  Cardiologist:  Deanna JONELLE Kobus, MD   Referring MD: Deanna Petty, LOISE*   No chief complaint on file.    ASSESSMENT AND PLAN:   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   Poor historian overall. ere for the visit today by herself. Lives with a friend at home who helps care for her.   Would strongly recommend having a family member or her friend who is providing care for her at home to accompany her for subsequent visits.  Problem List Items Addressed This Visit     Chronic diastolic CHF (congestive heart failure) (HCC) - Primary   Appears euvolemic and compensated.  Continue salt restriction less than 2 g/day. Continue torsemide  20 mg on an as-needed basis for any significant weight gain over 2 to 3 pounds in a day or 4 to 5 pounds in a week.  Continue Farxiga  10 mg once daily       Essential hypertension (Chronic)   Well-controlled on current regimen with amlodipine , bisoprolol , Imdur .       Paroxysmal atrial fibrillation [facility]), diagnosed March 2024, CHADS2 Vascor 6, on anticoagulation with Eliquis    Remains on anticoagulation with Eliquis  5 mg twice daily, tolerating well. Appears to be in sinus rhythm based on exam with regular rate and rhythm. EKG not performed today.       CAD (coronary artery disease), last April 2024,  distal LCx 70% stenosis small vessel for intervention, on medical therapy   Appears asymptomatic from cardiac standpoint at this time. Remains on apixaban . Continue Imdur  30 mg once daily.  N  continue amlodipine  5 mg once daily Continue bisoprolol  5 mg once daily.  Continue rosuvastatin  5 mg once daily.       Moderate aortic stenosis, echocardiogram 04/12/2024   Repeat follow-up echocardiogram imaging in 3 months prior to next follow-up visit for interval assessment of aortic stenosis.       Recommend follow-up visit tentatively in 3 months and request caregiver or family member to accompany her.   History of Present Illness:    Deanna Petty is a 78 y.o. female who is being seen today for follow-up visit. PCP is Deanna Petty, N*. Last visit with me in the office was 06/14/2024.  Poor historian.  Here for the visit today by herself. Lives with a friend at home who helps care for her [ Deanna Petty was not in given by patient today].  Mobility is limited and somewhat able to transition herself in and out of the wheelchair.  Uses a bedside commode.  Has a son and 2 grandchildren.  Son lives in [place].  Another friend of hers brought her in for the visit today but did not accompany her into the exam room.  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  She is unable to provide any significant history. Mostly answers yes and no for a lot of questions.  She was unable to identify todays day of the week or  the date.  Unable to provide information what she had for breakfast today.  Denies any significant chest pain, shortness of breath, orthopnea. No pedal edema. Denies any palpitations, lightheadedness, dizziness.  Unable to provide much information with regards to her medications.  Past Medical History:  Diagnosis Date   Acute heart failure with preserved ejection fraction (HFpEF) (HCC) 01/15/2023   Acute on chronic diastolic CHF (congestive heart failure) (HCC) 07/07/2020   Age-related osteoporosis without current pathological fracture 10/03/2023   Angiopathy, diabetic (HCC) 10/03/2023   Aortic stenosis, mild to moderate based on prior echocardiogram from March 2024. 10/26/2023   ARF (acute renal failure) 04/11/2024   Bronchitis    CAD (coronary artery disease), last April 2024, distal LCx 70% stenosis small vessel for intervention, on medical therapy 10/26/2023   Chronic diastolic CHF (congestive heart failure) (HCC) 07/07/2020   Chronic respiratory failure with hypoxia (HCC) 05/24/2022   Cigarette smoker    COPD (chronic obstructive pulmonary disease) (HCC)    Elevated brain natriuretic peptide (BNP) level 07/03/2020   Essential hypertension 01/15/2022   Fatigue 04/11/2024   Hypercoagulable state due to paroxysmal atrial fibrillation (HCC) 01/18/2023   Hypokalemia 01/12/2023   Hypomagnesemia 01/16/2022   Hyponatremia 04/11/2024   Left breast mass 05/14/2013   Lung nodule 08/17/2023   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) 02/19/2024   Mass of left lung 08/17/2023   Moderate aortic stenosis, TTE 04/25/2024 10/26/2023   Myocardial infarction due to demand ischemia (HCC) 01/12/2023   New onset atrial fibrillation (HCC) 01/12/2023   Pain in right hip 10/03/2023   Paroxysmal atrial fibrillation (HCC), diagnosed March 2024, CHADS2 Vascor 6, on anticoagulation with Eliquis  01/12/2023   Peripheral vascular disease 10/03/2023   Pneumonia    Polycythemia, secondary  07/07/2020   Screening for malignant neoplasm of colon 10/03/2023   Shortness of breath    with exertion; lasted used inhaler 06/02/13   Tobacco use 07/03/2020   Type 2 diabetes mellitus with hyperlipidemia (HCC) 07/07/2020   Yeah 01/04/2024    Past Surgical History:  Procedure Laterality Date   ABDOMINAL HYSTERECTOMY     BREAST LUMPECTOMY WITH NEEDLE LOCALIZATION Left 06/12/2013   Procedure: LEFT BREAST LUMPECTOMY WITH NEEDLE LOCALIZATION;  Surgeon: Donnice POUR. Belinda, MD;  Location: [facility] OR;  Service: General;  Laterality: Left;   BREAST SURGERY     HIP FRACTURE SURGERY Right    LEFT HEART CATH AND CORONARY ANGIOGRAPHY N/A 01/17/2023   Procedure: LEFT HEART CATH AND CORONARY ANGIOGRAPHY;  Surgeon: Mady Bruckner, MD;  Location: [facility] INVASIVE CV LAB;  Service: Cardiovascular;  Laterality: N/A;    Current Medications: Current Meds  Medication Sig   acetaminophen  (TYLENOL ) 500 MG tablet Take 500 mg by mouth every 6 (six) hours as needed for mild pain (pain score 1-3) or moderate pain (pain score 4-6).   ALPRAZolam (XANAX) 0.5 MG tablet Take 0.5 mg by mouth at bedtime as needed for anxiety.   amLODipine  (NORVASC ) 5 MG tablet Take 1 tablet (5 mg total) by  mouth daily.   apixaban  (ELIQUIS ) 5 MG TABS tablet Take 1 tablet (5 mg total) by mouth 2 (two) times daily.   ascorbic acid (VITAMIN C) 500 MG tablet Take 500 mg by mouth daily.   bisoprolol  (ZEBETA ) 5 MG tablet Take 1 tablet (5 mg total) by mouth daily.   buPROPion  (WELLBUTRIN  XL) 150 MG 24 hr tablet Take 150 mg by mouth daily.   Calcium  Carb-Cholecalciferol (CALCIUM  + VITAMIN D3 PO) Take 1 tablet by mouth 2 (two) times daily.   cetirizine (ZYRTEC) 10 MG chewable tablet Chew 10 mg by mouth daily.   cyclobenzaprine (FLEXERIL) 10 MG tablet Take 10 mg by mouth 2 (two) times daily as needed for muscle spasms.   dapagliflozin  propanediol (FARXIGA ) 10 MG TABS tablet Take 1 tablet (10 mg total) by mouth daily.   fluticasone  (FLONASE ) 50 MCG/ACT  nasal spray Place 1-2 sprays into both nostrils daily as needed for allergies.   Fluticasone -Umeclidin-Vilant (TRELEGY ELLIPTA ) 100-62.5-25 MCG/ACT AEPB Inhale 1 puff into the lungs daily.   isosorbide  mononitrate (IMDUR ) 30 MG 24 hr tablet Take 30 mg by mouth daily.   metFORMIN  (GLUCOPHAGE ) 1000 MG tablet Take 1,000 mg by mouth daily.   montelukast (SINGULAIR) 10 MG tablet Take 10 mg by mouth at bedtime.   omeprazole (PRILOSEC) 20 MG capsule Take 20 mg by mouth daily.   rosuvastatin  (CRESTOR ) 5 MG tablet Take 5 mg by mouth daily.   torsemide  (DEMADEX ) 20 MG tablet Take 1 tablet (20 mg total) by mouth daily as needed (weight gain of 2-3 pounds in a day or 5 pounds in a week).   [DISCONTINUED] aspirin  EC 81 MG tablet Take 1 tablet (81 mg total) by mouth daily. Swallow whole.     Allergies:   Patient has no known allergies.   Social History   Socioeconomic History   Marital status: Single    Spouse name: Not on file   Number of children: Not on file   Years of education: Not on file   Highest education level: Not on file  Occupational History   Not on file  Tobacco Use   Smoking status: Every Day    Current packs/day: 0.25    Average packs/day: 0.3 packs/day for 40.0 years (10.0 ttl pk-yrs)    Types: Cigarettes   Smokeless tobacco: Never   Tobacco comments:    Smokes 3cigs per day 01/25/24  Vaping Use   Vaping status: Never Used  Substance and Sexual Activity   Alcohol use: Yes    Comment: rare   Drug use: No   Sexual activity: Not on file  Other Topics Concern   Not on file  Social History Narrative   Not on file   Social Drivers of Health   Financial Resource Strain: Not on file  Food Insecurity: No Food Insecurity (04/12/2024)   Hunger Vital Sign    Worried About Running Out of Food in the Last Year: Never true    Ran Out of Food in the Last Year: Never true  Transportation Needs: No Transportation Needs (04/12/2024)   PRAPARE - Scientist, Research (physical Sciences) (Medical): No    Lack of Transportation (Non-Medical): No  Physical Activity: Not on file  Stress: Not on file  Social Connections: Moderately Integrated (04/14/2024)   Social Connection and Isolation Panel    Frequency of Communication with Friends and Family: More than three times a week    Frequency of Social Gatherings with Friends and Family: Never  Attends Religious Services: 1 to 4 times per year    Active Member of Clubs or Organizations: Not on file    Attends Banker Meetings: More than 4 times per year    Marital Status: Never married     Family History: The patient's family history includes Cancer in her mother; Diabetes in her father. ROS:   Please see the history of present illness.    All 14 point review of systems negative except as described per history of present illness.  EKGs/Labs/Other Studies Reviewed:    The following studies were reviewed today:   EKG:       Recent Labs: 04/12/2024: ALT 14 04/14/2024: Magnesium  2.0 04/16/2024: BUN 6; Creatinine, Ser 0.75; Hemoglobin 11.8; Platelets 234; Potassium 4.5; Sodium 137  Recent Lipid Panel    Component Value Date/Time   CHOL 92 01/13/2023 0150   TRIG 46 01/13/2023 0150   HDL 51 01/13/2023 0150   CHOLHDL 1.8 01/13/2023 0150   VLDL 9 01/13/2023 0150   LDLCALC 32 01/13/2023 0150    Physical Exam:    VS:  BP (!) 127/50   Pulse 64   Ht 5' 5 (1.651 m)   Wt 163 lb 9.6 oz (74.2 kg)   SpO2 93%   BMI 27.22 kg/m     Wt Readings from Last 3 Encounters:  08/29/24 163 lb 9.6 oz (74.2 kg)  07/04/24 171 lb (77.6 kg)  06/14/24 174 lb (78.9 kg)     GENERAL:  Well nourished, well developed in no acute distress NECK: No JVD; No carotid bruits CARDIAC: RRR, S1 and S2 present, 4/6 ejection systolic murmur CHEST:  Clear to auscultation without rales, wheezing or rhonchi  Extremities: No pitting pedal edema. Pulses bilaterally symmetric with radial 2+ and dorsalis pedis 2+ NEUROLOGIC:   Alert and oriented x person and place.  Unable to identify date or date.  Unable to recollect much information with regards to her diet this morning.  Medication Adjustments/Labs and Tests Ordered: Current medicines are reviewed at length with the patient today.  Concerns regarding medicines are outlined above.  No orders of the defined types were placed in this encounter.  No orders of the defined types were placed in this encounter.   Signed, Deanna jess Kobus, MD, MPH, [facility]. 08/29/2024 11:11 AM    [facility]

## 2024-08-29 NOTE — Assessment & Plan Note (Signed)
 Remains on anticoagulation with Eliquis  5 mg twice daily, tolerating well. Appears to be in sinus rhythm based on exam with regular rate and rhythm. EKG not performed today.

## 2024-08-29 NOTE — Addendum Note (Signed)
 Addended by: ONEITA BERLINER on: 08/29/2024 11:23 AM   Modules accepted: Orders

## 2024-08-29 NOTE — Assessment & Plan Note (Signed)
 Appears euvolemic and compensated.  Continue salt restriction less than 2 g/day. Continue torsemide  20 mg on an as-needed basis for any significant weight gain over 2 to 3 pounds in a day or 4 to 5 pounds in a week.  Continue Farxiga  10 mg once daily

## 2024-08-29 NOTE — Patient Instructions (Signed)
 Bring family/friend to next visit.  Medication Instructions:  Your physician recommends that you continue on your current medications as directed. Please refer to the Current Medication list given to you today.  *If you need a refill on your cardiac medications before your next appointment, please call your pharmacy*   Lab Work: Your physician recommends that you have a CMP, CBC, magnesium  and ProBNP today in the office.  If you have labs (blood work) drawn today and your tests are completely normal, you will receive your results only by: MyChart Message (if you have MyChart) OR A paper copy in the mail If you have any lab test that is abnormal or we need to change your treatment, we will call you to review the results.  Testing/Procedures: Your physician has requested that you have an echocardiogram in 3 months. Echocardiography is a painless test that uses sound waves to create images of your heart. It provides your doctor with information about the size and shape of your heart and how well your heart's chambers and valves are working. This procedure takes approximately one hour. There are no restrictions for this procedure. Please do NOT wear cologne, perfume, aftershave, or lotions (deodorant is allowed). Please arrive 15 minutes prior to your appointment time.  Please note: We ask at that you not bring children with you during ultrasound (echo/ vascular) testing. Due to room size and safety concerns, children are not allowed in the ultrasound rooms during exams. Our front office staff cannot provide observation of children in our lobby area while testing is being conducted. An adult accompanying a patient to their appointment will only be allowed in the ultrasound room at the discretion of the ultrasound technician under special circumstances. We apologize for any inconvenience.  Follow-Up: At [facility], you and your health needs are our priority.  As part of our continuing mission  to provide you with exceptional heart care, we have created designated Provider Care Teams.  These Care Teams include your primary Cardiologist (physician) and Advanced Practice Providers (APPs -  Physician Assistants and Nurse Practitioners) who all work together to provide you with the care you need, when you need it.  We recommend signing up for the patient portal called MyChart.  Sign up information is provided on this After Visit Summary.  MyChart is used to connect with patients for Virtual Visits (Telemedicine).  Patients are able to view lab/test results, encounter notes, upcoming appointments, etc.  Non-urgent messages can be sent to your provider as well.   To learn more about what you can do with MyChart, go to [email or web address].    Your next appointment:   3 month(s)  The format for your next appointment:   In Person  Provider:   Alean Madireddy, MD   Other Instructions Echocardiogram An echocardiogram is a test that uses sound waves (ultrasound) to produce images of the heart. Images from an echocardiogram can provide important information about: Heart size and shape. The size and thickness and movement of your heart's walls. Heart muscle function and strength. Heart valve function or if you have stenosis. Stenosis is when the heart valves are too narrow. If blood is flowing backward through the heart valves (regurgitation). A tumor or infectious growth around the heart valves. Areas of heart muscle that are not working well because of poor blood flow or injury from a heart attack. Aneurysm detection. An aneurysm is a weak or damaged part of an artery wall. The wall bulges out from the normal force  of blood pumping through the body. Tell a health care provider about: Any allergies you have. All medicines you are taking, including vitamins, herbs, eye drops, creams, and over-the-counter medicines. Any blood disorders you have. Any surgeries you have had. Any  medical conditions you have. Whether you are pregnant or may be pregnant. What are the risks? Generally, this is a safe test. However, problems may occur, including an allergic reaction to dye (contrast) that may be used during the test. What happens before the test? No specific preparation is needed. You may eat and drink normally. What happens during the test? You will take off your clothes from the waist up and put on a hospital gown. Electrodes or electrocardiogram (ECG)patches may be placed on your chest. The electrodes or patches are then connected to a device that monitors your heart rate and rhythm. You will lie down on a table for an ultrasound exam. A gel will be applied to your chest to help sound waves pass through your skin. A handheld device, called a transducer, will be pressed against your chest and moved over your heart. The transducer produces sound waves that travel to your heart and bounce back (or echo back) to the transducer. These sound waves will be captured in real-time and changed into images of your heart that can be viewed on a video monitor. The images will be recorded on a computer and reviewed by your health care provider. You may be asked to change positions or hold your breath for a short time. This makes it easier to get different views or better views of your heart. In some cases, you may receive contrast through an IV in one of your veins. This can improve the quality of the pictures from your heart. The procedure may vary among health care providers and hospitals.   What can I expect after the test? You may return to your normal, everyday life, including diet, activities, and medicines, unless your health care provider tells you not to do that. Follow these instructions at home: It is up to you to get the results of your test. Ask your health care provider, or the department that is doing the test, when your results will be ready. Keep all follow-up visits.  This is important. Summary An echocardiogram is a test that uses sound waves (ultrasound) to produce images of the heart. Images from an echocardiogram can provide important information about the size and shape of your heart, heart muscle function, heart valve function, and other possible heart problems. You do not need to do anything to prepare before this test. You may eat and drink normally. After the echocardiogram is completed, you may return to your normal, everyday life, unless your health care provider tells you not to do that. This information is not intended to replace advice given to you by your health care provider. Make sure you discuss any questions you have with your health care provider. Document Revised: 05/26/2020 Document Reviewed: 05/26/2020 Elsevier Patient Education  2021 Elsevier Inc.   Important Information About Sugar

## 2024-08-29 NOTE — Assessment & Plan Note (Signed)
 Repeat follow-up echocardiogram imaging in 3 months prior to next follow-up visit for interval assessment of aortic stenosis.

## 2024-08-29 NOTE — Assessment & Plan Note (Signed)
 Well-controlled on current regimen with amlodipine , bisoprolol , Imdur .

## 2024-08-29 NOTE — Assessment & Plan Note (Addendum)
 Appears asymptomatic from cardiac standpoint at this time. Remains on apixaban . Continue Imdur  30 mg once daily.  N  continue amlodipine  5 mg once daily Continue bisoprolol  5 mg once daily.  Continue rosuvastatin  5 mg once daily.

## 2024-08-30 LAB — COMPREHENSIVE METABOLIC PANEL WITH GFR
ALT: 13 IU/L (ref 0–32)
AST: 18 IU/L (ref 0–40)
Albumin: 3.9 g/dL (ref 3.8–4.8)
Alkaline Phosphatase: 102 IU/L (ref 49–135)
BUN/Creatinine Ratio: 17 (ref 12–28)
BUN: 14 mg/dL (ref 8–27)
Bilirubin Total: 0.4 mg/dL (ref 0.0–1.2)
CO2: 28 mmol/L (ref 20–29)
Calcium: 9.4 mg/dL (ref 8.7–10.3)
Chloride: 102 mmol/L (ref 96–106)
Creatinine, Ser: 0.84 mg/dL (ref 0.57–1.00)
Globulin, Total: 3 g/dL (ref 1.5–4.5)
Glucose: 115 mg/dL — ABNORMAL HIGH (ref 70–99)
Potassium: 4.4 mmol/L (ref 3.5–5.2)
Sodium: 143 mmol/L (ref 134–144)
Total Protein: 6.9 g/dL (ref 6.0–8.5)
eGFR: 72 mL/min/1.73 (ref >59)

## 2024-08-30 LAB — CBC
Hematocrit: 42.7 % (ref 34.0–46.6)
Hemoglobin: 12.6 g/dL (ref 11.1–15.9)
MCH: 25 pg — ABNORMAL LOW (ref 26.6–33.0)
MCHC: 29.5 g/dL — ABNORMAL LOW (ref 31.5–35.7)
MCV: 85 fL (ref 79–97)
Platelets: 269 x10E3/uL (ref 150–450)
RBC: 5.03 x10E6/uL (ref 3.77–5.28)
RDW: 16.5 % — ABNORMAL HIGH (ref 11.7–15.4)
WBC: 14.4 x10E3/uL — ABNORMAL HIGH (ref 3.4–10.8)

## 2024-08-30 LAB — MAGNESIUM: Magnesium: 1.8 mg/dL (ref 1.6–2.3)

## 2024-08-30 LAB — PRO B NATRIURETIC PEPTIDE: NT-Pro BNP: 725 pg/mL (ref 0–738)

## 2024-09-16 ENCOUNTER — Other Ambulatory Visit: Payer: Self-pay

## 2024-09-19 NOTE — Telephone Encounter (Signed)
 NFN

## 2024-11-26 ENCOUNTER — Other Ambulatory Visit (HOSPITAL_BASED_OUTPATIENT_CLINIC_OR_DEPARTMENT_OTHER): Admitting: Radiology

## 2024-12-10 ENCOUNTER — Ambulatory Visit: Admitting: Vascular Surgery

## 2024-12-10 ENCOUNTER — Encounter (HOSPITAL_COMMUNITY)

## 2024-12-12 ENCOUNTER — Ambulatory Visit

## 2024-12-24 ENCOUNTER — Ambulatory Visit

## 2025-01-03 ENCOUNTER — Ambulatory Visit: Admitting: Emergency Medicine
